# Patient Record
Sex: Male | Born: 1935 | Race: White | Hispanic: No | Marital: Married | Smoking: Former smoker
Health system: Southern US, Community
[De-identification: ages and names within clinical notes are randomized; demographics above are authoritative.]

## PROBLEM LIST (undated history)

## (undated) DIAGNOSIS — I639 Cerebral infarction, unspecified: Secondary | ICD-10-CM

## (undated) DIAGNOSIS — Z87891 Personal history of nicotine dependence: Secondary | ICD-10-CM

## (undated) DIAGNOSIS — I10 Essential (primary) hypertension: Secondary | ICD-10-CM

## (undated) DIAGNOSIS — I739 Peripheral vascular disease, unspecified: Secondary | ICD-10-CM

## (undated) DIAGNOSIS — I2510 Atherosclerotic heart disease of native coronary artery without angina pectoris: Secondary | ICD-10-CM

## (undated) DIAGNOSIS — J449 Chronic obstructive pulmonary disease, unspecified: Secondary | ICD-10-CM

## (undated) DIAGNOSIS — I200 Unstable angina: Secondary | ICD-10-CM

## (undated) DIAGNOSIS — I5042 Chronic combined systolic (congestive) and diastolic (congestive) heart failure: Secondary | ICD-10-CM

## (undated) DIAGNOSIS — J9610 Chronic respiratory failure, unspecified whether with hypoxia or hypercapnia: Secondary | ICD-10-CM

## (undated) DIAGNOSIS — N183 Chronic kidney disease, stage 3 unspecified: Secondary | ICD-10-CM

## (undated) DIAGNOSIS — M1990 Unspecified osteoarthritis, unspecified site: Secondary | ICD-10-CM

## (undated) DIAGNOSIS — E039 Hypothyroidism, unspecified: Secondary | ICD-10-CM

## (undated) DIAGNOSIS — I255 Ischemic cardiomyopathy: Secondary | ICD-10-CM

## (undated) DIAGNOSIS — C801 Malignant (primary) neoplasm, unspecified: Secondary | ICD-10-CM

## (undated) DIAGNOSIS — F419 Anxiety disorder, unspecified: Secondary | ICD-10-CM

## (undated) HISTORY — PX: OTHER SURGICAL HISTORY: SHX169

## (undated) HISTORY — PX: VASCULAR SURGERY: SHX849

## (undated) HISTORY — PX: STOMACH SURGERY: SHX791

## (undated) HISTORY — PX: BACK SURGERY: SHX140

## (undated) HISTORY — DX: Unstable angina: I20.0

---

## 2005-08-17 ENCOUNTER — Ambulatory Visit (HOSPITAL_COMMUNITY): Admission: RE | Admit: 2005-08-17 | Discharge: 2005-08-17 | Payer: Self-pay | Admitting: Vascular Surgery

## 2005-09-25 ENCOUNTER — Ambulatory Visit: Payer: Self-pay | Admitting: Cardiology

## 2005-09-29 ENCOUNTER — Ambulatory Visit: Payer: Self-pay | Admitting: Cardiology

## 2005-09-29 ENCOUNTER — Inpatient Hospital Stay (HOSPITAL_BASED_OUTPATIENT_CLINIC_OR_DEPARTMENT_OTHER): Admission: RE | Admit: 2005-09-29 | Discharge: 2005-09-29 | Payer: Self-pay | Admitting: Cardiology

## 2005-10-13 ENCOUNTER — Ambulatory Visit: Payer: Self-pay | Admitting: Cardiology

## 2005-10-15 ENCOUNTER — Inpatient Hospital Stay (HOSPITAL_COMMUNITY): Admission: RE | Admit: 2005-10-15 | Discharge: 2005-10-20 | Payer: Self-pay | Admitting: Vascular Surgery

## 2006-05-12 ENCOUNTER — Ambulatory Visit: Payer: Self-pay | Admitting: Vascular Surgery

## 2007-01-25 ENCOUNTER — Ambulatory Visit: Payer: Self-pay | Admitting: Vascular Surgery

## 2010-03-06 HISTORY — PX: CARDIAC CATHETERIZATION: SHX172

## 2010-07-29 NOTE — Assessment & Plan Note (Signed)
OFFICE VISIT   Earl Mueller, Earl Mueller  DOB:  May 19, 1935                                       01/25/2007  CHART#:[id number]   I saw the patient in the office today for continued to followup of his  peripheral vascular disease.  This is a pleasant 75 year old gentleman  who had undergone an aortobifemoral bypass graft back in August of 2007  for aortoiliac occlusive disease.  He had known bilateral superficial  femoral artery occlusions at that time also.  His symptoms improved  after his bypass.  However, he has continued to have some claudication  in both calves related to his superficial femoral artery occlusive  disease.  Over the last 4 to 5 months, the symptoms in the right leg  especially have gradually worsened.  He experiences pain in both calves,  associated with ambulation and relieved with rest.  He can walk about 15-  20 minutes before experiencing symptoms.  He has had no thigh or hip  claudication.  He denies any history of rest pain and has had no history  of nonhealing wounds.  He did not notice any sudden change in the onset  of his symptoms.  He noted a more gradual change.   REVIEW OF SYSTEMS:  Documented in the medical history form in his chart.   SOCIAL HISTORY:  He does continue to smoke less than 1/2 pack per day of  cigarettes.   PHYSICAL EXAMINATION:  This is a pleasant 75 year old gentleman who  appears his stated age.  His blood pressure is 157/72, heart rate is 80.  HEENT, he has a right carotid bruit.  There is no cervical  lymphadenopathy.  Lungs are clear bilaterally to auscultation.  On  cardiac exam, he has a regular rate and rhythm.  Abdomen is soft and  nontender.  He has normal-pitched bowel sounds.  His incisions have all  healed nicely.  He has palpable femoral pulses bilaterally.  I cannot  palpate popliteal or pedal pulses on either side.  He has a right  posterior tibial signal with the Doppler, which is monophasic.  On  the  left side, he has a dorsalis pedis and posterior tibial signal with the  Doppler, which is monophasic.  He has no ischemic ulcers on his feet.  He has no significant lower extremity swelling.  Neurological exam is  nonfocal.   This patient has stable claudication related to his known superficial  femoral artery occlusive disease.  There has been some slight  progression.  His aortofemoral bypass graft is functioning well.  Again,  I have stressed to him the importance of tobacco cessation.  Have  discussed the importance of a structured walking program to help  maintain and build collaterals.  He understands if his symptoms progress  or become disabling, the next step would be arteriography to determine  what options he has for an infrainguinal bypass.  If he can get by  without, however, I would like to avoid re-operating on his groins  because with an aortofemoral bypass graft, there is some slight increase  of infection with this.  I plan on seeing him back in 6 months.  He  knows to call sooner if he has problems.  We will also obtain a followup  carotid duplex of his moderate 40-59% right carotid stenosis at that  time.   Di Kindle. Edilia Bo, M.D.  Electronically Signed   CSD/MEDQ  D:  01/25/2007  T:  01/26/2007  Job:  [id number]   cc:   Jacqualine Mau

## 2010-08-01 NOTE — Letter (Signed)
October 12, 2005     Di Kindle. Edilia Bo, MD  [address]  [place], [place]  [address]   RE:  Earl Mueller, Earl Mueller  MRN:  [id number]  /  DOB:  [birth year]   Dear Thayer Ohm:  This letter is in regards to Earl Mueller.  As you know, he was referred for  followup of his abnormal stress test which suggested moderate lateral wall  ischemia and reduced ejection fraction.  He has significant peripheral  vascular disease and multiple cardiovascular risk factors.  Despite this, he  does not describe any chest discomfort or other anginal equivalents.  He is  somewhat limited in activities because of his legs.  I did refer him for a  cardiac catheterization.  The results are described extensively in Dr.  Ival Bible note.  He has diffusely diseased vessels with small LAD,  moderately diffusely diseased.  He does have a focal proximal 70% circumflex  lesion.  The right coronary artery has 40-50% stenosis improving with  nitroglycerin.  His EF was approximately 50%.   It was not felt that this gentleman had any critical lesions that would  benefit from revascularization prior to revascularization of his lower  extremities.  This is in accordance with emerging data to suggest the  benefit of medical management in patients such as this being equivalent or  superior to intervention.   Based on this, this patient is at some risk for cardiovascular events.  However, again to reduce this risk optimally, we will pursue aggressive  medical management.  Towards that end, he is started on a beta blocker and  will also be on nitrates.   He should be monitored closely postoperatively and continued on these  medications.   I have had a discussion with the patient.  In addition, Dr. Diona Browner  reviewed this with the patient and his wife.  I would be happy to be  notified upon his admission to follow along with you during his  hospitalization.  If you have any questions, please do not hesitate to give  me a  page at [phone].    Sincerely,      Rollene Rotunda, MD, [facility]   JH/MedQ  DD:  10/12/2005  DT:  10/12/2005  Job #:  [id number]

## 2010-08-01 NOTE — Op Note (Signed)
NAME:  KWELI, GRASSEL NO.:  [id number]   MEDICAL RECORD NO.:  [id number]          PATIENT TYPE:  AMB   LOCATION:  SDS                          FACILITY:  [facility]   PHYSICIAN:  Di Kindle. Edilia Bo, M.D.DATE OF BIRTH:  1936-02-26   DATE OF PROCEDURE:  08/17/2005  DATE OF DISCHARGE:                                 OPERATIVE REPORT   PREOPERATIVE DIAGNOSIS:  Aortoiliac occlusive disease with disabling  claudication.   POSTOPERATIVE DIAGNOSIS:  Aortoiliac occlusive disease with disabling  claudication.   PROCEDURE:  1.  Aortogram.  2.  Bilateral iliac arteriogram.  3.  Bilateral lower extremity runoff.   SURGEON:  Dr. Edilia Bo.   ANESTHESIA:  Local with sedation.   TECHNIQUE:  The patient was taken to the PV lab at [facility], and sedated with 1  mg of Versed and 50 mcg of fentanyl.  Both groins were prepped and draped in  usual sterile fashion.  Using a Doppler, I was able to cannulate the right  common femoral artery, after the skin was anesthetized.  The 5-French sheath  was introduced into the right common femoral artery and the dilator was  removed, and a pigtail catheter positioned at the L1 vertebral body.  Flush  aortogram obtained.  The catheter was then repositioned above the aortic  bifurcation, and oblique iliac projections were obtained, then bilateral  lower extremity runoff films were obtained.  The pigtail was removed over a  wire, and additional films were obtained of the right external iliac artery  and right groin through the sheath.   FINDINGS:  There are single renal arteries bilaterally with no significant  renal artery stenosis identified.  There is no significant narrowing within  the infrarenal aorta.  There is a large IMA which is providing some  collateral flow to the pelvis.   On the right side, there is mild diffuse disease of the common iliac artery  with severe diffuse disease of the external iliac artery on the right.  There is a  focal stenosis in the common femoral artery, and the superficial  femoral artery is occluded at its origin.  The deep femoral artery is  patent.  There is reconstitution of the above-knee popliteal artery,  although this has moderate diffuse disease.  The below-knee popliteal artery  is patent and the anterior tibial and peroneal arteries are occluded on the  right with single-vessel runoff via the posterior tibial artery on the  right.   On the left side, there is mild diffuse disease of the common iliac artery.  There is a long segment stenosis of the external iliac artery, and the  common femoral artery and deep femoral artery are patent on the left.  The  superficial femoral artery is occluded at its origin with reconstitution of  the below-knee popliteal artery.  Proximally the tibial vessels are patent.  There is posterior tibial runoff to the foot.  There is poor visualization  distally in the peroneal and anterior tibial arteries.   CONCLUSIONS:  Severe iliac disease bilaterally and bilateral superficial  femoral artery occlusions with tibial occlusive  disease as described above.      Di Kindle. Edilia Bo, M.D.  Electronically Signed     CSD/MEDQ  D:  08/17/2005  T:  08/17/2005  Job:  [id number]   cc:   Dr. Brantley Persons. Edilia Bo, M.D.  [address]  [place]  [place] [address]

## 2010-08-01 NOTE — Op Note (Signed)
NAME:  Earl Mueller, Earl Mueller NO.:  [id number]   MEDICAL RECORD NO.:  [id number]          PATIENT TYPE:  INP   LOCATION:  2550                         FACILITY:  [facility]   PHYSICIAN:  Di Kindle. Edilia Bo, M.D.DATE OF BIRTH:  June 06, 1935   DATE OF PROCEDURE:  10/15/2005  DATE OF DISCHARGE:                                 OPERATIVE REPORT   PREOPERATIVE DIAGNOSES:  1.  Aortoiliac occlusive disease.  2.  Bilateral superficial femoral artery occlusions.   POSTOPERATIVE DIAGNOSES:  1.  Aortoiliac occlusive disease.  2.  Bilateral superficial femoral artery occlusions.   PROCEDURE:  Aortobifemoral bypass graft with 14 x 7 Dacron graft.   SURGEON:  Di Kindle. Edilia Bo, M.D.   ASSISTANTS:  Larina Earthly, M.D., and Jerold Coombe, P.A.   ANESTHESIA:  General.   INDICATIONS:  This is a 75 year old gentleman who had presented with  disabling claudication.  Despite attempts at a walking program and tobacco  cessation, he continued to have disabling symptoms and wished to proceed  with revascularization.  Arteriogram demonstrated bilateral severe iliac  disease, and it was felt that the best option was aortofemoral bypass  grafting.  He underwent preoperative cardiac evaluation and was cleared for  surgery.  He presents for elective aortofemoral bypass graft surgery.   TECHNIQUE:  The patient was taken to the operating room and received a  general anesthetic.  Swan-Ganz catheter and arterial line had been placed by  anesthesia.  The left groin was opened longitudinally and the common  femoral, deep femoral and superficial femoral arteries were exposed.  The  superficial femoral artery was chronically occluded.  There was extensive  plaque within the common femoral artery, but it was a soft plaque and had a  soft spot along the anterior lateral aspect of the artery.  The  retroperitoneal tunnel was started on the left.  Next a longitudinal  incision was made in the  right groin and the right common femoral, deep  femoral and superficial femoral artery were controlled.  There were 2 deep  femoral branches.  Again there was extensive calcific disease within the  common femoral artery.  The retroperitoneal tunnel was started on the right.  The abdomen was then entered through a midline incision.  The patient had  had previous partial gastrectomy and extensive adhesions were lysed to allow  mobilization of the transverse colon omentum superiorly.  The small bowel  was then reflected to the patient's right.  Retroperitoneal tissue was  divided and the infrarenal aorta exposed up to the level of the right renal  artery, which was the most inferior renal artery.  The distal aorta was  exposed down the bifurcation and the retroperitoneal tunnels were created.  The patient was then heparinized and also received 25 g of mannitol.  A 14 x  7 graft was selected.  The infrarenal aorta was clamped and the distal aorta  clamped.  This patient had functionally occluded external iliac arteries  bilaterally with a large IMA, and I thought an end-to-side anastomosis was  indicated proximally.  Once the aorta had  been controlled, a longitudinal  arteriotomy was made and an ellipse of aorta excised.  The 14 x 7 graft was  cut to the appropriate length, spatulated and sewn end to side to the  infrarenal aorta using continuous 3-0 Prolene suture.  The proximal  anastomosis was flushed and tested and was hemostatic.  The graft limbs were  then clamped.  Each limb was brought down to their respective limbs.  Using  a 2-team approach, the limbs of the graft was anastomosed to the distal  common femoral artery, extending onto the deep femoral artery on both sides.  On the left side the proximal common femoral artery was clamped.  The deep  femoral artery was clamped.  A longitudinal arteriotomy was made along the  anterior lateral aspect of the common femoral artery extending  onto the deep  femoral artery origin.  The left limb of the graft was cut to the  appropriate length, spatulated and sewn end-to-side to the artery using  continuous 5-0 Prolene suture.  Prior to completing this anastomosis, the  artery was back bled and flushed appropriately and the anastomosis  completed.  Flow was reestablished to the left leg.  On the right side the  common femoral artery was clamped proximally.  The deep femoral artery was  controlled with a vessel loop distally.  A longitudinal arteriotomy was made  in the common femoral artery extending down onto the deep femoral artery  origin.  Some old clot was removed and then the right limb of the graft cut  to the appropriate length, spatulated and sewn end-to-side to the common  femoral artery extending onto the deep femoral artery using continuous 5-0  Prolene suture.  Again, prior to completing this anastomosis the artery was  back bled and flushed appropriately and the anastomosis completed.  Flow was  reestablished to the right leg, which the patient tolerated from a  hemodynamic standpoint.  Hemostasis was obtained in the wounds and the  heparin was reversed with the protamine.  The retroperitoneal tissue was  closed with running 2-0 Vicryl.  The abdominal contents were returned to  their normal position and the fascial layer was closed with two #1 PDS  sutures.  The skin was closed with staples.  Hemostasis was obtained in the  groins and the groins were closed with a deep layer of 2-0 Vicryl, a  subcutaneous layer of 2-0 Vicryl and the skin closed with a 4-0 subcuticular  stitch.  Sterile dressing was applied.  The patient tolerated the procedure  well and was transferred to the recovery room in satisfactory condition.  All needle and sponge counts were correct.      Di Kindle. Edilia Bo, M.D.  Electronically Signed     CSD/MEDQ  D:  10/15/2005  T:  10/15/2005  Job:  [id number]  cc:   Rollene Rotunda, M.D.  Jacqualine Mau

## 2010-08-01 NOTE — Cardiovascular Report (Signed)
NAME:  Earl Mueller, Earl Mueller NO.:  [id number]   MEDICAL RECORD NO.:  [id number]          PATIENT TYPE:  OIB   LOCATION:  1966                         FACILITY:  [facility]   PHYSICIAN:  Jonelle Sidle, M.D. LHCDATE OF BIRTH:  04/07/1935   DATE OF PROCEDURE:  09/29/2005  DATE OF DISCHARGE:                              CARDIAC CATHETERIZATION   REQUESTING CARDIOLOGIST:  Rollene Rotunda, M.D.   INDICATIONS:  Mr. Kalmbach is a 75 year old male with known peripheral  arterial disease particularly involving the aortoiliac system.  He is being  considered for vascular surgery and was referred for preoperative evaluation  including a myocardial perfusion study.  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  No symptoms were  reported with this study nor were there diagnostic electrocardiographic  changes.  He was seen by Dr. Antoine Poche and referred for diagnostic coronary  angiography to clearly assess the coronary anatomy and evaluate for any  possible revascularization strategies.  Additional history includes  hypertension and a remote history of tobacco use.  His main limiting symptom  at this point is claudication.  The risks and benefits were explained to the  patient in advance and informed consent was obtained.   PROCEDURES PERFORMED:  1.  Left heart catheterization  2.  Selective coronary angiography.  3.  Left ventriculography.  4.  Distal aortography.   ACCESS AND EQUIPMENT:  The area about the right femoral artery was  anesthetized with 1% lidocaine and a 5 French sheath was placed following a  single anterior wall puncture using the modified Seldinger technique.  A  long 5-French sheath was used.  A wire was fairly easily passed through the  area of right iliac stenosis that was described by angiography in June.  Standard preformed 5-French JL-4 and  JR-4 catheters were used for selective  coronary angiography and an angled pigtail catheter was used for left heart  catheterization, left ventriculography, and distal aortography.  All  exchanges were made over a wire.  A total 125 mL Omnipaque were used.  The  patient tolerated the procedure well without immediate complications.   HEMODYNAMIC RESULTS:  Aorta 128/55 mmHg.  Left ventricle 135/4 mmHg.   ANGIOGRAPHIC FINDINGS:  1.  The left main coronary artery is mildly calcified and gives rise to the      left anterior descending and circumflex coronary arteries.  This vessel      was relatively short.  There is no significant flow limiting coronary      atherosclerosis noted.  2.  The circumflex coronary artery is a small to medium caliber vessel.      There are four obtuse marginal branches noted, the second of which is      the largest and bifurcates.  Within the proximal circumflex is a 70%      focal stenosis.  There are other minor luminal irregularities seen, as      well.  3.  The left anterior descending is a  relatively small caliber vessel that      extends to the apex.  There is essentially one medium caliber diagonal      branch at the mid vessel level.  A fairly large septal perforator is      noted proximally.  Within the proximal left anterior descending is      calcification as well as a diffuse 70% stenosis.  This also affects the      ostium of the septal perforator which is stenosed at approximately 75%.      There is a 50% focal stenosis within the mid left anterior descending      prior to the takeoff of the diagonal branch.  4.  The right coronary artery is a small caliber vessel with small posterior      descending branch.  There is a fairly prominent right ventricular branch      noted near the ostium of the vessel.  There are minor luminal      irregularities noted.  Within the proximal right coronary artery is a 40-      50% stenosis that actually looked more  pronounced with an element of      coronary vasospasm that was likely catheter induced.  With intracoronary      nitroglycerin, this area improved to a baseline of approximately 40-50%.  5.  Left ventriculography was performed in the RAO projection.  There was      significant ventricular ectopy which limited this somewhat, however,      ejection fraction is estimated at 50% and there is no significant mitral      regurgitation noted.  6.  Distal aortography was performed.  The renal arteries are patent      bilaterally.  There is a fairly severe iliac disease involving the      common iliacs, predominantly right much more significant than left.  The      left sided disease is more focal in character.  The distal vessels are      not well seen.   DIAGNOSIS:  1.  Coronary disease as outlined.  The coronary vessels are relatively small      to at most medium in caliber and likely diffusely diseased.  There is a      70% diffuse area of stenosis within the proximal left anterior      descending that also affects the ostium of a large septal perforator.      Within the proximal circumflex is an area of 70% stenosis and an area of      approximately 40-50% stenosis noted within the proximal right coronary      artery.  There was an element of spasm affecting the proximal coronary      artery which improved with intracoronary nitroglycerin.  2.  Left ventricular ejection fraction approximately 50% with no mitral      regurgitation and left ventricular end diastolic pressure of 4 mmHg.  3.  Severe bilateral iliac disease, right greater than left.  There is more      diffuse disease noted on the right.  The renal arteries are patent      bilaterally.   DISCUSSION:  I reviewed the results with the patient and his wife.  I also  had Dr. Juanda Chance review the films and discussed the results with Dr. Antoine Poche.  At this point, the plan will be to continue his medical therapy with the addition of a  low-dose beta blocker and  eventually a statin.  It is not  entirely clear that coronary revascularizations is required at this point to  consider peripheral arterial surgery.  Particularly in the absence of  symptoms (angina or  congestive heart failure) and with no large anterior defect demonstrated by  a Myoview study.  He will, however, carry at least moderately increased  perioperative risk for cardiac complications.  The plan will be to follow  with Dr. Antoine Poche.  I will have the films reviewed also by our peripheral  interventionalist and formulation of final plan.      Jonelle Sidle, M.D. [facility]  Electronically Signed     SGM/MEDQ  D:  09/29/2005  T:  09/29/2005  Job:  [id number]   cc:   Rollene Rotunda, M.D.  [address]  [address]  [place]  [place] [address]   Wyonia Hough, M.D.   Di Kindle. Edilia Bo, M.D.  [address]  [place]  [place] [address]

## 2010-08-01 NOTE — H&P (Signed)
NAME:  Earl Mueller, Earl Mueller NO.:  [id number]   MEDICAL RECORD NO.:  [id number]           PATIENT TYPE:   LOCATION:                                 FACILITY:   PHYSICIAN:  Di Kindle. Edilia Bo, M.D.DATE OF BIRTH:  1936-02-10   DATE OF ADMISSION:  10/15/2005  DATE OF DISCHARGE:                                HISTORY & PHYSICAL   REASON FOR ADMISSION:  Aortoiliac occlusive disease with disabling  claudication.   HISTORY:  This is a pleasant 75 year old gentleman whom I have been  following with peripheral vascular disease.  He had known aortoiliac  occlusive disease.  When I last saw him in followup in May, his symptoms had  become quite disabling and he wished to pursue revascularization.  At that  time, he had an ABI of 38% on the right and 39% on the left.  He has  undergone cardiac evaluation including Cardiolite which was abnormal.  This  prompted a cardiac catheterization which showed some diffuse coronary  disease but it was felt that this could be treated medically and he has  cleared by Dr. Antoine Poche for aortofemoral bypass grafting.  This patient has  had a long history of claudication in both lower extremities which began  back in 1997.  He experiences pain in his calves, thighs, and hips  associated with ambulation and relieved with rest.  He has had no  significant rest pain and no history of nonhealing wounds.   PAST MEDICAL HISTORY:  Fairly unremarkable.  He denies any history of  diabetes, hypertension, hypercholesterolemia, history of previous myocardial  infarction, or history of congestive heart failure or emphysema.   FAMILY HISTORY:  His mother died with cancer.  His father died in his 7s  with congestive heart failure.  He is unaware of any history of premature  cardiovascular disease.   SOCIAL HISTORY:  He is married.  He has two children.  He smokes a pack of  cigarettes per day but has cut back considerably in that he had been smoking  up  to two packs per day.  We have on multiple occasions discussed the  importance of tobacco cessation.   REVIEW OF SYSTEMS:  GENERAL:  He has had no weight loss, weight gain,  problems with his appetite.  CARDIAC:  He has had no recent chest pain,  chest pressure, palpitations, or arrhythmias.  PULMONARY:  He has had no  recent bronchitis, asthma, or wheezing.  GI:  He has had no recent in his  bowel habits and has no history of peptic ulcer disease.  GU:  He has had no  dysuria or frequency.  VASCULAR:  He has had no DVT or phlebitis.  He has  significant bilateral lower extremity claudication as described above.  NEURO:  He has had no dizziness, black-outs, headaches, or seizures.  Psychiatric:  He has had nom nervousness or depression.  HEMATOLOGIC:  He  has had no bleeding problems or clotting disorders.   ALLERGIES:  No known drug allergies.   MEDICATIONS:  Aspirin daily.   PHYSICAL EXAMINATION:  VITAL  SIGNS:  Blood pressure 120/76, heart rate 78.  He has bilateral carotid bruits.  LUNGS:  Clear bilaterally to auscultation.  CARDIAC:  He has regular rate and rhythm.  ABDOMEN:  Soft and nontender.  EXTREMITIES:  I cannot palpate femoral, popliteal, or pedal pulses.  He has  no ischemic ulcers on his feet.  He has no significant lower extremity  swelling.   LABORATORY DATA:  His arteriogram shows diffuse iliac disease bilaterally  with a large IMA.  He has significant external iliac artery disease,  especially on both sides.   Given that his symptoms have become disabling, he would like to pursue  aortofemoral bypass grafting.  I think this is the best option given his  age.  We have discussed the indications for the procedure and potential  complications including but not limited to bleeding, graft thrombosis, wound  healing problems, graft infection, MI, or other unpredictable medical  problems.  All of his questions were answered and he is agreeable to  proceed.  His surgery  has been scheduled for October 15, 2005.      Di Kindle. Edilia Bo, M.D.  Electronically Signed     CSD/MEDQ  D:  10/14/2005  T:  10/14/2005  Job:  [id number]   cc:   Rollene Rotunda, M.D.  Jacqualine Mau

## 2010-08-01 NOTE — Assessment & Plan Note (Signed)
[facility]                              CARDIOLOGY OFFICE NOTE   REMY, VOILES                       MRN:          [id number]  DATE:09/25/2005                            DOB:          Mar 30, 1935    PRIMARY CARE PHYSICIAN:  Jacqualine Mau, M.D.   REASON FOR REFERRAL:  Patient with abnormal stress perfusion study.   HISTORY OF PRESENT ILLNESS:  The patient is a very pleasant 75 year old  gentleman with no history of coronary disease.  He has documented severe  peripheral vascular disease with aorto-iliac occlusive disease.  He is to  have lower extremity revascularization.  As part of this evaluation by Dr.  Waverly Ferrari, he had a stress perfusion study.  This was done at  [facility].  It demonstrated lateral wall ischemia.  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  His EF was about 38%.  He saw Dr. Lodema Hong today  in follow up.  Dr. Lodema Hong called our office and he was added onto my  schedule.  His lower extremity revascularization is pending preoperative  clearance.   The patient has been very limited by his claudication.  He walks a short  distance on level ground before getting leg pain.  With his low level of  activity, he does not describe any chest discomfort, neck discomfort, arm  discomfort, activity induced nausea, vomiting, or excessive diaphoresis.  He  has had no palpitations, pre-syncope, or syncope.  He denies any PND or  orthopnea.  He continues to smoke one pack of cigarettes a day.   PAST MEDICAL HISTORY:  Hypertension recently diagnosed, peripheral vascular  disease as described, non-obstructive carotid artery disease.  He has never  been told that he had diabetes or dyslipidemia.   PAST SURGICAL HISTORY:  Stomach surgery for bleeding ulcer in 1972, back  surgery.   ALLERGIES:  None.   MEDICATIONS:  1.  Aspirin 81 mg daily.  2.  Triamterene/hydrochlorothiazide 37.5/25 mg daily.   SOCIAL HISTORY:  The  patient has been smoking since his teenage years though  he quit for ten years when his children were young.  He is retired.  He is  married.  He has two children.   FAMILY HISTORY:  Noncontributory for early coronary artery disease, though  his father had heart disease starting in his 30s.   REVIEW OF SYSTEMS:  As stated in the HPI.  Positive for cough.  Negative for  all other systems.   PHYSICAL EXAMINATION:  GENERAL:  The patient is in no distress.  VITAL SIGNS:  Blood pressure 166/73, heart rate 74 and regular, weight 131  pounds.  HEENT:  Eyes unremarkable.  Pupils equal, round, reactive to light.  Fundi  not visualized.  Oral mucosa unremarkable.  NECK:  No jugular venous distention at 45 degrees, carotid upstroke brisk  and symmetric, no thyromegaly.  LYMPHATICS:  No cervical, axillary, or inguinal adenopathy.  LUNGS:  Decreased breath sounds without wheezing or crackles.  BACK:  No costovertebral angle tenderness.  CHEST:  Unremarkable.  HEART:  PMI not displaced or sustained,  S1 and S2 within normal limits, no  S3, no S4, no murmurs.  ABDOMEN:  Well healed surgical scar, flat, positive bowel sounds, normal in  frequency and pitch, no bruits, no rebound, guarding in the midline,  pulsatile mass or organomegaly.  SKIN:  No rashes, no nodules.  EXTREMITIES:  2+ upper pulses, 1+ bilateral femorals, absent popliteals and  dorsalis pedis, and posterior tibialis bilaterally, no cyanosis or clubbing,  no edema.  NEUROLOGICAL:  Oriented to person, place, and time.  Cranial nerves 2-12  grossly intact.  Motor grossly intact.   EKG sinus rhythm, rate 72, axis within normal limits, intervals within  normal limits, no acute ST-T wave change.   ASSESSMENT/PLAN:  1.  Abnormal Cardiolite.  The patient has an abnormal Cardiolite with      evidence for possible significant coronary disease.  Given this, I would      not interpret this as a low risk scan.  He is also going for a high  risk      surgery.  He is very low functional status.  Therefore, despite the      absence of symptoms, I think the next step in his preoperative      evaluation needs to be cardiac catheterization.  He understands the      risks and benefits of this.  His wife has been through the procedure and      has had bypass surgery.  He agrees to this.  I have reviewed his      angiogram and it looks like we would be able to proceed via the groin      and I will make sure of this.  He will be set up with an outpatient      procedure and I will set him up with Dr. Diona Browner on Tuesday.  2.  Tobacco.  He is going to start Chantix.  .  3.  Risk reduction.  He was just given a prescription for Lipitor today.  4.  Hypertension.  He can have this further addressed based on the results      of his      catheterization and follow up blood pressures.  5.  Follow up will be at the time of catheterization.                               Rollene Rotunda, MD, [facility]    JH/MedQ  DD:  09/25/2005  DT:  09/25/2005  Job #:  [id number]   cc:   Jacqualine Mau, M.D.  Di Kindle. Edilia Bo, MD

## 2010-08-01 NOTE — Discharge Summary (Signed)
NAME:  Earl Mueller, BACA NO.:  [id number]   MEDICAL RECORD NO.:  [id number]          PATIENT TYPE:  INP   LOCATION:  2028                         FACILITY:  [facility]   PHYSICIAN:  Di Kindle. Edilia Bo, M.D.DATE OF BIRTH:  1935/12/19   DATE OF ADMISSION:  10/15/2005  DATE OF DISCHARGE:                                 DISCHARGE SUMMARY   ADMISSION DATE:  October 15, 2005   DISCHARGE DATE:  Tentatively one to two days on October 19, 2005   ADMISSION DIAGNOSIS:  Aortoiliac occlusive disease with disabling  claudication.   DISCHARGE DIAGNOSES:  1. Aortoiliac occlusive disease status post an aortobifemoral bypass      graft.  2. Hypertension.  3. Hyperlipidemia.   CONSULTS:  None.   PROCEDURE:  On October 15, 2005, the patient underwent aortobifemoral bypass  graft with a 14 x 7 Dacron graft by Dr. Waverly Ferrari.   HISTORY AND PHYSICAL:  This is a pleasant 75 year old gentleman who was  admitted for peripheral vascular disease.  The patient has known aortoiliac  occlusive disease.  Patient was first seen in follow-up in May, symptoms had  become quite disabling and he wish to pursue vascularization.  At that time,  he had an ABI of 38% on the right and 39% on the left.  Patient has  undergone cardiac evaluation including Cardiolite which is abnormal.  This  prompted a cardiac cath which showed some diffuse coronary disease which was  felt could be treated medically and he has been cleared by Dr. Antoine Poche  for  an aortofemoral bypass graft.  Patient has had a long history of  claudication in both lower extremities which began back in 1987.  The  patient experiences pain in his calves, thighs, and hips associated with  ambulation and relieved with rest.  The patient has no significant rest pain  and no history of nonhealing wounds.   HOSPITAL COURSE:  Postoperatively, the patient has progressed as expected  and quite well.  Patient has remained hemodynamically  stable.  The patient's  electrolytes have remained stable also.  The patient's NG tube stayed in  until the patient's GI function had returned.  The patient remained NPO on  postop day number two.  On postop day number three, the patient started sips  of clears.  On postop day number four, the patient was eating a regular diet  and tolerating this well.   The patient's pain was managed with a Dilaudid PCA pump.  Patient's PCA pump  was discontinued on October 19, 2005 and he was started on p.o. pain  medication and his home medications.  The patient is ambulating fairly well.  The patient is contributing to his incentive spirometry.  A tobacco  cessation consult was given prior to discharge due to his long history of  smoking.  The patient did experience a bowel movement on postop day number  three.   PHYSICAL EXAMINATION:  VITAL SIGNS:  Blood pressure 129/79, heart rate 95,  respirations 18, temp 97.5, O2 sat is 96% on room air, INO negative 2200,  weight 60.6  kg.  CARDIOVASCULAR:  Regular rate and rhythm.  RESPIRATIONS:  Clear to auscultation bilaterally.  ABDOMEN:  Bowel sounds positive, soft, nontender, nondistended.  EXTREMITIES:  No edema.  Left lower extremities warm bilaterally.  Incisions  clear, dry and intact.   Labs show a white blood cell count of 5.8, hemoglobin 11.3, hematocrit 32.5,  platelet count 185.  BMP showed a sodium of 139, potassium 4.2, chloride  106, CO2 27, BUN 5, creatinine 0.7 and glucose of 117.   The patient will be discharged home in the next two days provided he is  tolerating his diet well and ambulating without difficulty.   DISCHARGE CONDITION:  Good.   DISPOSITION:  The patient will be discharged home.   MEDICATIONS:  1. Tylox one to two tablets every four hours p.r.n.  2. Aspirin 81 mg p.o. daily.  3. Lipitor 10 mg p.o. daily.  4. Toprol XL 25 mg p.o. daily.   INSTRUCTIONS:  The patient instructed to follow a low fat, low-salt diet.  No  driving or heavy lifting greater than ten pounds for three weeks.  Patient is to ambulate and increase activity as tolerated.  Patient is to  continue his breathing exercises.  Patient may clean his wounds with mild  soap and water.  He is instructed to call the office for any problems such  as incision erythema, drainage, temp greater than 101.5.  The patient was  counseled on smoking cessation.   FOLLOWUP:  The patient will have a follow-up appointment with Dr. Edilia Bo in  three weeks, the office will contact him with the time and date.  Patient  will have a staple-removal appointment in two weeks, the office will contact  him, again, with the date and time.      Constance Holster, PA      Di Kindle. Edilia Bo, M.D.  Electronically Signed    JMW/MEDQ  D:  10/19/2005  T:  10/19/2005  Job:  [id number]

## 2010-08-01 NOTE — Assessment & Plan Note (Signed)
[facility]                              CARDIOLOGY OFFICE NOTE   NAME:Earl Mueller, Earl Mueller                       MRN:          [id number]  DATE:10/13/2005                            DOB:          18-Dec-1935    PRIMARY CARE PHYSICIAN:  Dr. Jacqualine Mau.   REASON FOR PRESENTATION:  Evaluation of patient with coronary disease.   HISTORY OF PRESENT ILLNESS:  Patient follows up after his cardiac  catheterization.  He was found by Dr. Diona Browner to have an LAD with  calcification and diffuse 70% stenosis.  There was 50% in the mid vessel.  The circumflex had a proximal 70% stenosis.  The right coronary artery had  some 40-50% stenosis that seemed to dilate with nitrates.  The EF was  approximately 50%.   Based on this and after careful consideration with Drs. Bennie Hind,  and myself, it was note felt that the patient had a critical lesion that  necessitated intervention before having a lower extremity surgery; however,  he is at moderate risk, and we are attempting to optimize his beta blocker  to provide risk reduction for impending lower extremity revascularization.   He returns today.  He had no problems following the catheterization.  He has  had no chest pain or shortness of breath.  He continues to deny with  activity any anginal symptoms.  He is somewhat limited by lower extremity  pain.   PAST MEDICAL HISTORY:  Hypertension, peripheral vascular disease,  nonobstructive carotid artery disease, stomach surgery for bleeding ulcer in  1972, back surgery.   ALLERGIES:  None.   MEDICATIONS:  Aspirin 81 mg a day, metoprolol 12.5 mg daily,  triamterene/hydrochlorothiazide, Lipitor 20 mg a day.   REVIEW OF SYSTEMS:  As stated in the HPI and otherwise negative for other  systems.   PHYSICAL EXAMINATION:  VITAL SIGNS:  Blood pressure 142/78, heart rate 88  and regular.  Weight 133 pounds.  GENERAL:  Patient is in no distress.  HEENT:  Eyes  unremarkable.  Pupils are equal, round and reactive to light.  Fundi are not visualized.  Oral mucosa unremarkable.  NECK:  No jugular venous distention, wave form within normal limits, carotid  upstroke brisk and symmetric with no bruits.  No bruits, no thyromegaly.  LYMPHATICS:  No cervical, axillary, or inguinal adenopathy.  LUNGS:  Clear to auscultation bilaterally.  BACK:  No costovertebral angle tenderness.  CHEST:  Unremarkable.  HEART:  PMI not displaced or sustained.  S1 and S2 within normal limits.  No  S3, no S4, no murmurs.  ABDOMEN:  A well-healed surgical scar, positive bowel sounds.  No rebound or  guarding.  No pulsatile mass.  EXTREMITIES:  Upper pulses 2+.  Bilateral femorals 1+.  Absent popliteals  and dorsalis pedis and posterior tibialis bilaterally.  NEUROLOGIC:  Grossly intact.   ASSESSMENT/PLAN:  1.  Coronary artery disease:  Patient has coronary disease, as described.      In order to reduce his perioperative risk, I am going to maximize his      beta blocker.  He is going to start by increasing the 25 mg Toprol XL a      day.  I will then increase over the next three days to 50 mg, if he      tolerates this.  The goal will be to try to get his heart rate into the      60s or lower.  He is waiting to hear back from Dr. Edilia Bo about the      timing of surgery.  2.  Tobacco:  He is planning on starting Chantix and quitting smoking after      his surgery.  3.  Followup:  We will see him back probably at the time of his      hospitalization and will schedule followup thereafter.                               Rollene Rotunda, MD, [facility]    JH/MedQ  DD:  10/13/2005  DT:  10/13/2005  Job #:  [id number]   cc:   Di Kindle. Edilia Bo, MD  Jacqualine Mau

## 2013-05-10 DIAGNOSIS — L98499 Non-pressure chronic ulcer of skin of other sites with unspecified severity: Secondary | ICD-10-CM

## 2013-05-10 HISTORY — DX: Non-pressure chronic ulcer of skin of other sites with unspecified severity: L98.499

## 2013-05-17 ENCOUNTER — Encounter (HOSPITAL_COMMUNITY): Payer: Self-pay | Admitting: Pharmacy Technician

## 2013-05-19 ENCOUNTER — Encounter (HOSPITAL_COMMUNITY): Payer: Self-pay

## 2013-05-19 ENCOUNTER — Encounter (INDEPENDENT_AMBULATORY_CARE_PROVIDER_SITE_OTHER): Payer: Self-pay

## 2013-05-19 ENCOUNTER — Encounter (HOSPITAL_COMMUNITY)
Admission: RE | Admit: 2013-05-19 | Discharge: 2013-05-19 | Disposition: A | Discharge status: Home / Self Care | Payer: Medicare PPO | Source: Ambulatory Visit | Attending: Plastic Surgery | Admitting: Plastic Surgery

## 2013-05-19 DIAGNOSIS — Z01810 Encounter for preprocedural cardiovascular examination: Secondary | ICD-10-CM | POA: Insufficient documentation

## 2013-05-19 DIAGNOSIS — Z01818 Encounter for other preprocedural examination: Secondary | ICD-10-CM | POA: Insufficient documentation

## 2013-05-19 DIAGNOSIS — Z01812 Encounter for preprocedural laboratory examination: Secondary | ICD-10-CM | POA: Insufficient documentation

## 2013-05-19 HISTORY — DX: Peripheral vascular disease, unspecified: I73.9

## 2013-05-19 HISTORY — DX: Atherosclerotic heart disease of native coronary artery without angina pectoris: I25.10

## 2013-05-19 HISTORY — DX: Hypothyroidism, unspecified: E03.9

## 2013-05-19 HISTORY — DX: Malignant (primary) neoplasm, unspecified: C80.1

## 2013-05-19 HISTORY — DX: Cerebral infarction, unspecified: I63.9

## 2013-05-19 HISTORY — DX: Anxiety disorder, unspecified: F41.9

## 2013-05-19 HISTORY — DX: Essential (primary) hypertension: I10

## 2013-05-19 LAB — BASIC METABOLIC PANEL
BUN: 19 mg/dL (ref 6–23)
CO2: 26 mEq/L (ref 19–32)
Calcium: 9.4 mg/dL (ref 8.4–10.5)
Chloride: 105 mEq/L (ref 96–112)
Creatinine, Ser: 1.53 mg/dL — ABNORMAL HIGH (ref 0.50–1.35)
GFR calc Af Amer: 50 mL/min — ABNORMAL LOW (ref >90)
GFR calc non Af Amer: 43 mL/min — ABNORMAL LOW (ref >90)
Glucose, Bld: 104 mg/dL — ABNORMAL HIGH (ref 70–99)
Potassium: 4.6 mEq/L (ref 3.7–5.3)
Sodium: 144 mEq/L (ref 137–147)

## 2013-05-19 LAB — CBC
HCT: 37.7 % — ABNORMAL LOW (ref 39.0–52.0)
HEMOGLOBIN: 12.9 g/dL — AB (ref 13.0–17.0)
MCH: 31.6 pg (ref 26.0–34.0)
MCHC: 34.2 g/dL (ref 30.0–36.0)
MCV: 92.4 fL (ref 78.0–100.0)
Platelets: 272 10*3/uL (ref 150–400)
RBC: 4.08 MIL/uL — ABNORMAL LOW (ref 4.22–5.81)
RDW: 17.1 % — ABNORMAL HIGH (ref 11.5–15.5)
WBC: 10 10*3/uL (ref 4.0–10.5)

## 2013-05-19 NOTE — H&P (Signed)
  Subjective:   Patient ID: Earl Mueller is a 78 y.o. male.   HPI  Here for evaluation chronic wound vertex scalp.No records to review today. Pt reports several year history ulceration scalp with intermittent bleeding. Biopsy demonstrated SCC and pt underwent Mohs resection 2013 with Dr. Jimmye Norman. The wound was left open. He was referred given size/extent for XRT and completed radiation scalp and continues in f/u with Dr. Jimmye Norman (Dermatology) for skin checks. He has continued with open wound of scalp and family and his Radiation Oncologist Earl Mueller) has encouraged him to seek care. He has discussed flap with his dermatologist.  No current drainage or bleeding. No current wound care.   Review of Systems  Constitutional: Negative.  Cardiovascular:  + leg pain with walking  Endocrine: Negative.  Allergic/Immunologic: Negative.   Objective:   Physical Exam  HENT:  Vertex scalp with open wound 1.5 cm x 1.5 cm with exposed dry bone no drainage  Neck:  R neck scar  Cardiovascular: Normal rate.  Pulmonary/Chest: Effort normal.  Psychiatric: He has a normal mood and affect.   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 Discussed that local flap would have increased wound healing complications given hx XRT; in addition as is scalp with minimal laxity may have secondary donor site to address/heal. Discussed excision of ulcer with burring of bone until bleeding encountered and placement of matrix such as A Cell or Integra. Discussed wound care following this, regular visits to me. If we can develop granulated bed then can plan second procedure for grafting. I am hesitant to offer grafting at time of debridement as I would like to ensure the radiated bed can support this. Discussed GA, outpatient surgery. May need anesthesia evaluation given his  comorbidities. Ok to continue on Plavix and ASA for this procedure given his vascular history.

## 2013-05-19 NOTE — Pre-Procedure Instructions (Signed)
Sheila L Canova  05/19/2013   Your procedure is scheduled on:  05/23/13  Report to [facility]  2 * 3 at 1130 AM.  Call this number if you have problems the morning of surgery: [phone]   Remember:   Do not eat food or drink liquids after midnight.   Take these medicines the morning of surgery with A SIP OF WATER: synthroid,paxil,plental   Do not wear jewelry, make-up or nail polish.  Do not wear lotions, powders, or perfumes. You may wear deodorant.  Do not shave 48 hours prior to surgery. Men may shave face and neck.  Do not bring valuables to the hospital.  [facility] is not responsible                  for any belongings or valuables.               Contacts, dentures or bridgework may not be worn into surgery.  Leave suitcase in the car. After surgery it may be brought to your room.  For patients admitted to the hospital, discharge time is determined by your                treatment team.               Patients discharged the day of surgery will not be allowed to drive  home.  Name and phone number of your driver:   Special Instructions: Shower using CHG 2 nights before surgery and the night before surgery.  If you shower the day of surgery use CHG.  Use special wash - you have one bottle of CHG for all showers.  You should use approximately 1/3 of the bottle for each shower.   Please read over the following fact sheets that you were given: Pain Booklet, Coughing and Deep Breathing and Surgical Site Infection Prevention   

## 2013-05-19 NOTE — Pre-Procedure Instructions (Signed)
ATHOL BOLDS  05/19/2013   Your procedure is scheduled on:  05/23/13  Report to [place]  2 * 3 at 1130 AM.  Call this number if you have problems the morning of surgery: [phone]   Remember:   Do not eat food or drink liquids after midnight.   Take these medicines the morning of surgery with A SIP OF WATER: synthroid,paxil,plental   Do not wear jewelry, make-up or nail polish.  Do not wear lotions, powders, or perfumes. You may wear deodorant.  Do not shave 48 hours prior to surgery. Men may shave face and neck.  Do not bring valuables to the hospital.  [facility] is not responsible                  for any belongings or valuables.               Contacts, dentures or bridgework may not be worn into surgery.  Leave suitcase in the car. After surgery it may be brought to your room.  For patients admitted to the hospital, discharge time is determined by your                treatment team.               Patients discharged the day of surgery will not be allowed to drive  home.  Name and phone number of your driver:   Special Instructions: Shower using CHG 2 nights before surgery and the night before surgery.  If you shower the day of surgery use CHG.  Use special wash - you have one bottle of CHG for all showers.  You should use approximately 1/3 of the bottle for each shower.   Please read over the following fact sheets that you were given: Pain Booklet, Coughing and Deep Breathing and Surgical Site Infection Prevention

## 2013-05-19 NOTE — Pre-Procedure Instructions (Signed)
Earl Mueller  05/19/2013   Your procedure is scheduled on:  05/23/13  Report to [facility]  2 * 3 at 1130 AM.  Call this number if you have problems the morning of surgery: [phone]   Remember:   Do not eat food or drink liquids after midnight.   Take these medicines the morning of surgery with A SIP OF WATER: synthroid,paxil,plental   Do not wear jewelry, make-up or nail polish.  Do not wear lotions, powders, or perfumes. You may wear deodorant.  Do not shave 48 hours prior to surgery. Men may shave face and neck.  Do not bring valuables to the hospital.  [facility] is not responsible                  for any belongings or valuables.               Contacts, dentures or bridgework may not be worn into surgery.  Leave suitcase in the car. After surgery it may be brought to your room.  For patients admitted to the hospital, discharge time is determined by your                treatment team.               Patients discharged the day of surgery will not be allowed to drive  home.  Name and phone number of your driver:   Special Instructions: Shower using CHG 2 nights before surgery and the night before surgery.  If you shower the day of surgery use CHG.  Use special wash - you have one bottle of CHG for all showers.  You should use approximately 1/3 of the bottle for each shower.   Please read over the following fact sheets that you were given: Pain Booklet, Coughing and Deep Breathing and Surgical Site Infection Prevention   

## 2013-05-22 ENCOUNTER — Encounter (HOSPITAL_COMMUNITY): Payer: Self-pay

## 2013-05-22 MED ORDER — CEFAZOLIN SODIUM-DEXTROSE 2-3 GM-% IV SOLR
2.0000 g | INTRAVENOUS | Status: AC
  Administered 2013-05-23: 2 g via INTRAVENOUS
  Filled 2013-05-22: qty 50

## 2013-05-22 NOTE — Progress Notes (Signed)
Anesthesia chart review: Patient is a 78 year old male scheduled for excision scalp ulcer, placement of A-cell Matrix on 05/23/2013 by Dr. Iran Planas.  PAT was Friday, 05/19/13. I was left chart to review following his PAT visit.      History includes AIOD/PAD s/p AFBG 10/15/05 with angioplasty of right limb of graft 05/19/10, former smoker, anxiety, HTN, hypothyroidism, stomach surgery X 2, back surgery, CVA with right eye blindness with underlying carotid artery stenosis s/p right CEA '12. CAD was not listed on his history, but it was noted on prior cardiac cath in 2007. (I updated his health history.) No records were requested either, but it was noted that he saw Dr. Geraldo Pitter who is a cardiologist at [facility]) in [place].  I called for records this morning for expedited records.  His last cardiology visit was 03/02/13.  He was noted to be stable from a cardiac and PAD standpoint. There was no recent stress or echo at [facility] according to front office staff or at [facility].  Records still pending from [facility].  There was no answer (only machine) on both phone numbers listed for patient.  Around 10:20 AM I did leave a voicemail on patient's cell phone for him to call me, but have yet to hear from him.  Meds: MVI, Paxil, ASA 81, Lipitor, Pletal, Plavix, Zetia, fenofibrate, Isordil, Synthroid, Nitro. His PAT RN cannot confirm if his Plavix has been held, and I have not been able to talk with patient.  EKG on 05/19/13 showed NSR, PACs, ST/T wave abnormality, consider lateral ischemia. T wave abnormality is more prominent in aVL, but otherwise probably not significantly changed when compared to prior EKG on 09/08/11 [facility]).  Cardiac cath on 09/29/05 showed: 1. The coronary vessels are relatively small to at most medium in caliber and likely diffusely diseased. There is a 70% diffuse area of stenosis within the proximal left anterior descending that also affects the ostium of a large septal  perforator. Within the proximal circumflex is an area of 70% stenosis and an area of approximately 40-50% stenosis noted within the proximal right coronary artery. There was an element of spasm affecting the proximal coronary artery which improved with intracoronary nitroglycerin. 2. Left ventricular ejection fraction approximately 50% with no mitral regurgitation and left ventricular end diastolic pressure of 4 mmHg. 3. Severe bilateral iliac disease, right greater than left. There is more diffuse disease noted on the right. The renal arteries are patent bilaterally.  Carotid duplex on 04/21/13 showed 16-49% RICA stenosis, right CEA 03/28/13 without stenosis, 41-66% LICA stenosis with mild (< 50%) stenosis of the left carotid bulb. Medical therapy was recommended at that time.  CXR on 05/19/13 showed:  1. Mild chronic interstitial accentuation in the lungs, without acute abnormality.  2. Mild stable cardiomegaly.  3. Bony demineralization.  4. Atherosclerosis.   Preoperative labs noted.  Cr 1.53, currently no comparison labs are available.  K 4.6, glucose 104, H/H 02/22/36.7.  I reviewed currently available information with anesthesiologist Dr. Marcie Bal.  Since patient had cardiology follow-up within the past three months and was felt stable then likely he can proceed if no new CV/CHF issues; however; formal evaluation by his assigned anesthesiologist on the day of surgery to make definitive plan.  [facility]Anesthesiology Phone [phone] 05/22/2013 12:42 PM  Addendum: 05/22/2013 12:50 PM Received records from [facility].  Apparently, he had an abnormal stress test showing anterolateral ischemia in late 2011.  He was in need  of a carotid endarterectomy at the time, so he underwent cardiac cath (with carotid angiogram) on 03/06/10 that showed: Severe stenosis (98%) of the ostial diagonal.  This is a small artery that should be trivial in the setting of a carotid  endarterectomy, otherwise non-obstructive CAD (40% proximal LAD, 30% diffuse mid/distal LAD, 30% proximal LCx, 50% mid LCx, 30% distal LCx, 30% OM2, 40% distal RCA). No LV gram performed due to recent echo with EF 65%. Medical therapy was recommended.

## 2013-05-23 ENCOUNTER — Encounter (HOSPITAL_COMMUNITY): Payer: Medicare PPO | Admitting: Vascular Surgery

## 2013-05-23 ENCOUNTER — Ambulatory Visit (HOSPITAL_COMMUNITY)
Admission: RE | Admit: 2013-05-23 | Discharge: 2013-05-23 | Disposition: A | Discharge status: Home / Self Care | Payer: Medicare PPO | Source: Ambulatory Visit | Attending: Plastic Surgery | Admitting: Plastic Surgery

## 2013-05-23 ENCOUNTER — Encounter (HOSPITAL_COMMUNITY): Payer: Self-pay | Admitting: Anesthesiology

## 2013-05-23 ENCOUNTER — Ambulatory Visit (HOSPITAL_COMMUNITY): Payer: Medicare PPO | Admitting: Anesthesiology

## 2013-05-23 ENCOUNTER — Encounter (HOSPITAL_COMMUNITY)
Admission: RE | Disposition: A | Discharge status: Home / Self Care | Payer: Self-pay | Source: Ambulatory Visit | Attending: Plastic Surgery

## 2013-05-23 DIAGNOSIS — F411 Generalized anxiety disorder: Secondary | ICD-10-CM | POA: Insufficient documentation

## 2013-05-23 DIAGNOSIS — E039 Hypothyroidism, unspecified: Secondary | ICD-10-CM | POA: Insufficient documentation

## 2013-05-23 DIAGNOSIS — L98499 Non-pressure chronic ulcer of skin of other sites with unspecified severity: Secondary | ICD-10-CM | POA: Insufficient documentation

## 2013-05-23 DIAGNOSIS — I2510 Atherosclerotic heart disease of native coronary artery without angina pectoris: Secondary | ICD-10-CM | POA: Insufficient documentation

## 2013-05-23 DIAGNOSIS — Z923 Personal history of irradiation: Secondary | ICD-10-CM | POA: Insufficient documentation

## 2013-05-23 DIAGNOSIS — Z87891 Personal history of nicotine dependence: Secondary | ICD-10-CM | POA: Insufficient documentation

## 2013-05-23 DIAGNOSIS — L98494 Non-pressure chronic ulcer of skin of other sites with necrosis of bone: Secondary | ICD-10-CM

## 2013-05-23 DIAGNOSIS — I739 Peripheral vascular disease, unspecified: Secondary | ICD-10-CM | POA: Insufficient documentation

## 2013-05-23 DIAGNOSIS — Z8673 Personal history of transient ischemic attack (TIA), and cerebral infarction without residual deficits: Secondary | ICD-10-CM | POA: Insufficient documentation

## 2013-05-23 DIAGNOSIS — I10 Essential (primary) hypertension: Secondary | ICD-10-CM | POA: Insufficient documentation

## 2013-05-23 DIAGNOSIS — Z85828 Personal history of other malignant neoplasm of skin: Secondary | ICD-10-CM | POA: Insufficient documentation

## 2013-05-23 HISTORY — PX: SCALP LACERATION REPAIR: SHX6089

## 2013-05-23 SURGERY — REPAIR, LACERATION, SCALP
Anesthesia: General | Site: Head

## 2013-05-23 MED ORDER — LACTATED RINGERS IV SOLN
INTRAVENOUS | Status: DC | PRN
  Administered 2013-05-23: 13:00:00 via INTRAVENOUS

## 2013-05-23 MED ORDER — BUPIVACAINE-EPINEPHRINE 0.25% -1:200000 IJ SOLN
INTRAMUSCULAR | Status: DC | PRN
  Administered 2013-05-23: 30 mL

## 2013-05-23 MED ORDER — SUCCINYLCHOLINE CHLORIDE 20 MG/ML IJ SOLN
INTRAMUSCULAR | Status: DC | PRN
Start: 2013-05-23 — End: 2013-05-23
  Administered 2013-05-23: 80 mg via INTRAVENOUS

## 2013-05-23 MED ORDER — 0.9 % SODIUM CHLORIDE (POUR BTL) OPTIME
TOPICAL | Status: DC | PRN
  Administered 2013-05-23: 1000 mL

## 2013-05-23 MED ORDER — LIDOCAINE HCL (CARDIAC) 20 MG/ML IV SOLN
INTRAVENOUS | Status: AC
  Filled 2013-05-23: qty 5

## 2013-05-23 MED ORDER — FENTANYL CITRATE 0.05 MG/ML IJ SOLN
INTRAMUSCULAR | Status: AC
  Filled 2013-05-23: qty 5

## 2013-05-23 MED ORDER — EPHEDRINE SULFATE 50 MG/ML IJ SOLN
INTRAMUSCULAR | Status: DC | PRN
  Administered 2013-05-23 (×2): 10 mg via INTRAVENOUS

## 2013-05-23 MED ORDER — PHENYLEPHRINE HCL 10 MG/ML IJ SOLN
10.0000 mg | INTRAVENOUS | Status: DC | PRN
  Administered 2013-05-23: 20 ug/min via INTRAVENOUS

## 2013-05-23 MED ORDER — PHENYLEPHRINE 40 MCG/ML (10ML) SYRINGE FOR IV PUSH (FOR BLOOD PRESSURE SUPPORT)
PREFILLED_SYRINGE | INTRAVENOUS | Status: AC
  Filled 2013-05-23: qty 10

## 2013-05-23 MED ORDER — PHENYLEPHRINE HCL 10 MG/ML IJ SOLN
INTRAMUSCULAR | Status: DC | PRN
  Administered 2013-05-23 (×2): 80 ug via INTRAVENOUS

## 2013-05-23 MED ORDER — LACTATED RINGERS IV SOLN
INTRAVENOUS | Status: DC
  Administered 2013-05-23: 50 mL/h via INTRAVENOUS

## 2013-05-23 MED ORDER — PROPOFOL 10 MG/ML IV BOLUS
INTRAVENOUS | Status: AC
  Filled 2013-05-23: qty 20

## 2013-05-23 MED ORDER — BUPIVACAINE-EPINEPHRINE PF 0.25-1:200000 % IJ SOLN
INTRAMUSCULAR | Status: AC
  Filled 2013-05-23: qty 30

## 2013-05-23 MED ORDER — ONDANSETRON HCL 4 MG/2ML IJ SOLN
INTRAMUSCULAR | Status: DC | PRN
  Administered 2013-05-23: 4 mg via INTRAVENOUS

## 2013-05-23 MED ORDER — ARTIFICIAL TEARS OP OINT
TOPICAL_OINTMENT | OPHTHALMIC | Status: DC | PRN
  Administered 2013-05-23: 1 via OPHTHALMIC

## 2013-05-23 MED ORDER — FENTANYL CITRATE 0.05 MG/ML IJ SOLN
INTRAMUSCULAR | Status: DC | PRN
  Administered 2013-05-23 (×2): 50 ug via INTRAVENOUS

## 2013-05-23 MED ORDER — LIDOCAINE HCL (CARDIAC) 20 MG/ML IV SOLN
INTRAVENOUS | Status: DC | PRN
  Administered 2013-05-23: 60 mg via INTRAVENOUS

## 2013-05-23 MED ORDER — PROPOFOL 10 MG/ML IV BOLUS
INTRAVENOUS | Status: DC | PRN
  Administered 2013-05-23: 40 mg via INTRAVENOUS
  Administered 2013-05-23: 100 mg via INTRAVENOUS

## 2013-05-23 MED ORDER — ONDANSETRON HCL 4 MG/2ML IJ SOLN
INTRAMUSCULAR | Status: AC
  Filled 2013-05-23: qty 2

## 2013-05-23 MED ORDER — GLYCOPYRROLATE 0.2 MG/ML IJ SOLN
INTRAMUSCULAR | Status: AC
  Filled 2013-05-23: qty 1

## 2013-05-23 SURGICAL SUPPLY — 43 items
BLADE SURG 10 STRL SS (BLADE) ×3 IMPLANT
BLADE SURG 15 STRL LF DISP TIS (BLADE) ×1 IMPLANT
BLADE SURG 15 STRL SS (BLADE) ×2
BUR EGG ELITE 4.0 (BURR) ×2 IMPLANT
BUR EGG ELITE 4.0MM (BURR) ×1
BURN MATRIX MATRISTEM 7X10 (Orthopedic Implant) ×3 IMPLANT
CANISTER SUCTION 2500CC (MISCELLANEOUS) ×3 IMPLANT
COVER SURGICAL LIGHT HANDLE (MISCELLANEOUS) ×3 IMPLANT
DRAPE NEUROLOGICAL W/INCISE (DRAPES) ×3 IMPLANT
DRSG EMULSION OIL 3X3 NADH (GAUZE/BANDAGES/DRESSINGS) ×3 IMPLANT
ELECT CAUTERY BLADE 6.4 (BLADE) ×3 IMPLANT
ELECT NEEDLE TIP 2.8 STRL (NEEDLE) IMPLANT
ELECT REM PT RETURN 9FT ADLT (ELECTROSURGICAL) ×3
ELECTRODE REM PT RTRN 9FT ADLT (ELECTROSURGICAL) ×1 IMPLANT
GLOVE BIO SURGEON STRL SZ 6.5 (GLOVE) ×2 IMPLANT
GLOVE BIO SURGEONS STRL SZ 6.5 (GLOVE) ×1
GLOVE BIOGEL PI IND STRL 6.5 (GLOVE) ×1 IMPLANT
GLOVE BIOGEL PI INDICATOR 6.5 (GLOVE) ×2
GLOVE ECLIPSE 6.5 STRL STRAW (GLOVE) ×3 IMPLANT
GOWN STRL REUS W/ TWL LRG LVL3 (GOWN DISPOSABLE) ×2 IMPLANT
GOWN STRL REUS W/TWL LRG LVL3 (GOWN DISPOSABLE) ×4
KIT BASIN OR (CUSTOM PROCEDURE TRAY) ×3 IMPLANT
KIT ROOM TURNOVER OR (KITS) ×3 IMPLANT
MICROMATRIX 200MG (Tissue) ×3 IMPLANT
NEEDLE HYPO 25GX1X1/2 BEV (NEEDLE) ×3 IMPLANT
NS IRRIG 1000ML POUR BTL (IV SOLUTION) ×3 IMPLANT
PACK SURGICAL SETUP 50X90 (CUSTOM PROCEDURE TRAY) ×3 IMPLANT
PAD ARMBOARD 7.5X6 YLW CONV (MISCELLANEOUS) ×3 IMPLANT
PENCIL BUTTON HOLSTER BLD 10FT (ELECTRODE) ×3 IMPLANT
SPECIMEN JAR SMALL (MISCELLANEOUS) ×3 IMPLANT
SPONGE GAUZE 4X4 12PLY (GAUZE/BANDAGES/DRESSINGS) ×9 IMPLANT
SPONGE LAP 18X18 X RAY DECT (DISPOSABLE) ×3 IMPLANT
STAPLER VISISTAT 35W (STAPLE) ×3 IMPLANT
SURGILUBE 2OZ TUBE FLIPTOP (MISCELLANEOUS) ×3 IMPLANT
SUT CHROMIC 4 0 PS 2 18 (SUTURE) ×3 IMPLANT
SUT SILK 4 0 P 3 (SUTURE) ×3 IMPLANT
SYR BULB 3OZ (MISCELLANEOUS) ×3 IMPLANT
SYR CONTROL 10ML LL (SYRINGE) ×3 IMPLANT
TOWEL OR 17X24 6PK STRL BLUE (TOWEL DISPOSABLE) ×3 IMPLANT
TOWEL OR 17X26 10 PK STRL BLUE (TOWEL DISPOSABLE) ×3 IMPLANT
TUBE CONNECTING 12'X1/4 (SUCTIONS) ×1
TUBE CONNECTING 12X1/4 (SUCTIONS) ×2 IMPLANT
YANKAUER SUCT BULB TIP NO VENT (SUCTIONS) ×3 IMPLANT

## 2013-05-23 NOTE — Progress Notes (Signed)
Pt is awake & alert, no c/o. O2 sat 91-94 % on RA. Uses IS 1750-2000 cc. Strong, dry cough.

## 2013-05-23 NOTE — Preoperative (Signed)
Beta Blockers   Reason not to administer Beta Blockers:Not Applicable 

## 2013-05-23 NOTE — Anesthesia Postprocedure Evaluation (Signed)
Anesthesia Post Note  Patient: Earl Mueller  Procedure(s) Performed: Procedure(s) (LRB): EXCISION SCALP ULCER DEBRIDEMENT OF SKIN AND BONE WITH PLACEMENT OF A-CELL (N/A)  Anesthesia type: GA  Patient location: PACU  Post pain: Pain level controlled  Post assessment: Post-op Vital signs reviewed  Last Vitals:  Filed Vitals:   05/23/13 1500  BP: 141/61  Pulse: 70  Temp:   Resp: 20    Post vital signs: Reviewed  Level of consciousness: sedated  Complications: No apparent anesthesia complications

## 2013-05-23 NOTE — Interval H&P Note (Signed)
History and Physical Interval Note:  05/23/2013 12:19 PM  Earl Mueller  has presented today for surgery, with the diagnosis of ULCER OF THE SCALP  The various methods of treatment have been discussed with the patient and family. After consideration of risks, benefits and other options for treatment, the patient has consented to  Procedure(s): EXCISION SCALP ULCER DEBRIDEMENT OF SKIN AND BONE WITH PLACEMENT OF A-CELL (N/A) as a surgical intervention .  The patient's history has been reviewed, patient examined, no change in status, stable for surgery.  I have reviewed the patient's chart and labs.  Questions were answered to the patient's satisfaction.     Jennica Tagliaferri

## 2013-05-23 NOTE — Progress Notes (Signed)
Lunch relief by A. Britt RN 

## 2013-05-23 NOTE — Progress Notes (Signed)
Pt encouraged to cough and deep breathe. Taught incentive spirometer. Will cont to monitor.

## 2013-05-23 NOTE — Anesthesia Procedure Notes (Signed)
Procedure Name: Intubation Date/Time: 05/23/2013 1:17 PM Performed by: Erik Obey Pre-anesthesia Checklist: Patient identified, Patient being monitored, Emergency Drugs available, Timeout performed and Suction available Patient Re-evaluated:Patient Re-evaluated prior to inductionOxygen Delivery Method: Circle system utilized Preoxygenation: Pre-oxygenation with 100% oxygen Intubation Type: IV induction Ventilation: Mask ventilation without difficulty and Oral airway inserted - appropriate to patient size Laryngoscope Size: Mac and 3 Grade View: Grade I Tube type: Oral Tube size: 7.5 mm Number of attempts: 1 Airway Equipment and Method: Stylet Placement Confirmation: ETT inserted through vocal cords under direct vision,  positive ETCO2 and breath sounds checked- equal and bilateral Secured at: 21 cm Tube secured with: Tape Dental Injury: Teeth and Oropharynx as per pre-operative assessment

## 2013-05-23 NOTE — Op Note (Signed)
Operative Note   DATE OF OPERATION: 05/23/13  LOCATION: [facility] Main OR- Outpatient  SURGICAL DIVISION: Plastic Surgery  PREOPERATIVE DIAGNOSES:  1. Chronic ulcer scalp with necrotic bone 2. History squamous cell carcinoma scalp 3. History of radiation scalp  POSTOPERATIVE DIAGNOSES:  same  PROCEDURE:  1. Debridement of scalp ulcer including skin, subcutaneous tissue, and bone 5 cm2. Application A Cell matrix 5 cm2  SURGEON: Irene Limbo MD MBA  ASSISTANT: none  ANESTHESIA:  General.   EBL: 10 ml  COMPLICATIONS: None.   INDICATIONS FOR PROCEDURE:  The patient, Earl Mueller, is a 78 y.o. male born on 1936-01-15, is here for debridement scalp ulcer incurred post Mohs resection scalp SCC over 1 year ago followed by radiation treatment.   FINDINGS: Dry exposed bone at vertex was debrided with burr and able to achieve punctate bleeding.   DESCRIPTION OF PROCEDURE:  The patient's operative site was marked with the patient in the preoperative area. The patient was taken to the operating room. SCDs were placed and IV antibiotics were given. The patient's operative site was prepped and draped in a sterile fashion. A time out was performed and all information was confirmed to be correct.  Sharp excision of all skin margins of ulcer was completed with knife. The specimen was marked for pathology. Care was taken to preserve periosteum beneath this skin excision. A burr was used to debride the exposed dry bone devoid of periosteum. This was completed until punctate bleeding was obtained in all areas. The wound was irrigated and hemostasis obtained with cautery. A Cell matrix powder was applied to wound base. A Cell matrix sheet was fenestrated and applied to wound and secured to skin with running 4-0 chromic suture. Adaptic and surgical lubricant applied prior to sterile sponge as bolster. This was secured to scalp with staples.   The patient was allowed to wake from anesthesia, extubated and taken to the  recovery room in satisfactory condition.   SPECIMENS: scalp ulcer  DRAINS: none  Irene Limbo, MD [facility] Plastic & Reconstructive Surgery [phone]

## 2013-05-23 NOTE — Transfer of Care (Signed)
Immediate Anesthesia Transfer of Care Note  Patient: Earl Mueller  Procedure(s) Performed: Procedure(s): EXCISION SCALP ULCER DEBRIDEMENT OF SKIN AND BONE WITH PLACEMENT OF A-CELL (N/A)  Patient Location: PACU  Anesthesia Type:General  Level of Consciousness: awake, alert  and oriented  Airway & Oxygen Therapy: Patient Spontanous Breathing and Patient connected to nasal cannula oxygen  Post-op Assessment: Report given to PACU RN and Post -op Vital signs reviewed and stable  Post vital signs: Reviewed and stable  Complications: No apparent anesthesia complications

## 2013-05-23 NOTE — Anesthesia Preprocedure Evaluation (Signed)
Anesthesia Evaluation  Patient identified by MRN, date of birth, ID band Patient awake    Reviewed: Allergy & Precautions, H&P , Patient's Chart, lab work & pertinent test results, reviewed documented beta blocker date and time   History of Anesthesia Complications Negative for: history of anesthetic complications  Airway Mallampati: II TM Distance: >3 FB Neck ROM: full    Dental   Pulmonary former smoker,  breath sounds clear to auscultation        Cardiovascular Exercise Tolerance: Good hypertension, + CAD and + Peripheral Vascular Disease Rhythm:regular Rate:Normal     Neuro/Psych Anxiety CVA    GI/Hepatic   Endo/Other  Hypothyroidism   Renal/GU      Musculoskeletal   Abdominal   Peds  Hematology   Anesthesia Other Findings   Reproductive/Obstetrics                           Anesthesia Physical Anesthesia Plan  ASA: III  Anesthesia Plan: General ETT   Post-op Pain Management:    Induction:   Airway Management Planned:   Additional Equipment:   Intra-op Plan:   Post-operative Plan:   Informed Consent: I have reviewed the patients History and Physical, chart, labs and discussed the procedure including the risks, benefits and alternatives for the proposed anesthesia with the patient or authorized representative who has indicated his/her understanding and acceptance.   Dental Advisory Given  Plan Discussed with: CRNA and Surgeon  Anesthesia Plan Comments:         Anesthesia Quick Evaluation

## 2013-05-25 ENCOUNTER — Encounter (HOSPITAL_COMMUNITY): Payer: Self-pay | Admitting: Plastic Surgery

## 2014-03-26 DIAGNOSIS — I2510 Atherosclerotic heart disease of native coronary artery without angina pectoris: Secondary | ICD-10-CM | POA: Diagnosis not present

## 2014-03-26 DIAGNOSIS — I739 Peripheral vascular disease, unspecified: Secondary | ICD-10-CM | POA: Diagnosis not present

## 2014-03-26 DIAGNOSIS — Z8673 Personal history of transient ischemic attack (TIA), and cerebral infarction without residual deficits: Secondary | ICD-10-CM | POA: Diagnosis not present

## 2014-03-26 DIAGNOSIS — I10 Essential (primary) hypertension: Secondary | ICD-10-CM | POA: Diagnosis not present

## 2014-03-26 DIAGNOSIS — E785 Hyperlipidemia, unspecified: Secondary | ICD-10-CM | POA: Diagnosis not present

## 2014-03-29 DIAGNOSIS — I10 Essential (primary) hypertension: Secondary | ICD-10-CM | POA: Diagnosis not present

## 2014-03-29 DIAGNOSIS — Z0000 Encounter for general adult medical examination without abnormal findings: Secondary | ICD-10-CM | POA: Diagnosis not present

## 2014-04-05 DIAGNOSIS — I2510 Atherosclerotic heart disease of native coronary artery without angina pectoris: Secondary | ICD-10-CM | POA: Diagnosis not present

## 2014-04-05 DIAGNOSIS — I10 Essential (primary) hypertension: Secondary | ICD-10-CM | POA: Diagnosis not present

## 2014-05-02 DIAGNOSIS — Z792 Long term (current) use of antibiotics: Secondary | ICD-10-CM | POA: Diagnosis not present

## 2014-05-02 DIAGNOSIS — I2510 Atherosclerotic heart disease of native coronary artery without angina pectoris: Secondary | ICD-10-CM | POA: Diagnosis not present

## 2014-05-02 DIAGNOSIS — Z7982 Long term (current) use of aspirin: Secondary | ICD-10-CM | POA: Diagnosis not present

## 2014-05-02 DIAGNOSIS — I509 Heart failure, unspecified: Secondary | ICD-10-CM | POA: Diagnosis not present

## 2014-05-02 DIAGNOSIS — K115 Sialolithiasis: Secondary | ICD-10-CM | POA: Diagnosis not present

## 2014-05-02 DIAGNOSIS — I10 Essential (primary) hypertension: Secondary | ICD-10-CM | POA: Diagnosis not present

## 2014-05-02 DIAGNOSIS — E780 Pure hypercholesterolemia: Secondary | ICD-10-CM | POA: Diagnosis not present

## 2014-05-02 DIAGNOSIS — R599 Enlarged lymph nodes, unspecified: Secondary | ICD-10-CM | POA: Diagnosis not present

## 2014-05-02 DIAGNOSIS — Z87891 Personal history of nicotine dependence: Secondary | ICD-10-CM | POA: Diagnosis not present

## 2014-05-07 DIAGNOSIS — K117 Disturbances of salivary secretion: Secondary | ICD-10-CM | POA: Diagnosis not present

## 2014-05-11 DIAGNOSIS — R221 Localized swelling, mass and lump, neck: Secondary | ICD-10-CM | POA: Diagnosis not present

## 2014-05-11 DIAGNOSIS — H6123 Impacted cerumen, bilateral: Secondary | ICD-10-CM | POA: Diagnosis not present

## 2014-05-11 DIAGNOSIS — J342 Deviated nasal septum: Secondary | ICD-10-CM | POA: Diagnosis not present

## 2014-05-29 DIAGNOSIS — I2510 Atherosclerotic heart disease of native coronary artery without angina pectoris: Secondary | ICD-10-CM | POA: Diagnosis not present

## 2014-05-29 DIAGNOSIS — R221 Localized swelling, mass and lump, neck: Secondary | ICD-10-CM | POA: Diagnosis not present

## 2014-05-29 DIAGNOSIS — K115 Sialolithiasis: Secondary | ICD-10-CM | POA: Diagnosis not present

## 2014-06-07 DIAGNOSIS — J342 Deviated nasal septum: Secondary | ICD-10-CM | POA: Diagnosis not present

## 2014-06-07 DIAGNOSIS — K115 Sialolithiasis: Secondary | ICD-10-CM | POA: Diagnosis not present

## 2014-06-07 DIAGNOSIS — D37032 Neoplasm of uncertain behavior of the submandibular salivary glands: Secondary | ICD-10-CM | POA: Diagnosis not present

## 2014-06-07 DIAGNOSIS — D37 Neoplasm of uncertain behavior of oral cavity and digestive organs: Secondary | ICD-10-CM | POA: Diagnosis not present

## 2014-06-15 DIAGNOSIS — I70213 Atherosclerosis of native arteries of extremities with intermittent claudication, bilateral legs: Secondary | ICD-10-CM | POA: Diagnosis not present

## 2014-06-15 DIAGNOSIS — E780 Pure hypercholesterolemia: Secondary | ICD-10-CM | POA: Diagnosis not present

## 2014-06-15 DIAGNOSIS — Z125 Encounter for screening for malignant neoplasm of prostate: Secondary | ICD-10-CM | POA: Diagnosis not present

## 2014-06-15 DIAGNOSIS — E782 Mixed hyperlipidemia: Secondary | ICD-10-CM | POA: Diagnosis not present

## 2014-06-15 DIAGNOSIS — I2510 Atherosclerotic heart disease of native coronary artery without angina pectoris: Secondary | ICD-10-CM | POA: Diagnosis not present

## 2014-06-15 DIAGNOSIS — I10 Essential (primary) hypertension: Secondary | ICD-10-CM | POA: Diagnosis not present

## 2014-06-15 DIAGNOSIS — Z01810 Encounter for preprocedural cardiovascular examination: Secondary | ICD-10-CM | POA: Diagnosis not present

## 2014-06-15 DIAGNOSIS — R233 Spontaneous ecchymoses: Secondary | ICD-10-CM | POA: Diagnosis not present

## 2014-06-15 DIAGNOSIS — R7301 Impaired fasting glucose: Secondary | ICD-10-CM | POA: Diagnosis not present

## 2014-06-18 DIAGNOSIS — Z01818 Encounter for other preprocedural examination: Secondary | ICD-10-CM | POA: Diagnosis not present

## 2014-06-18 DIAGNOSIS — J449 Chronic obstructive pulmonary disease, unspecified: Secondary | ICD-10-CM | POA: Diagnosis not present

## 2014-06-22 DIAGNOSIS — M6530 Trigger finger, unspecified finger: Secondary | ICD-10-CM | POA: Diagnosis not present

## 2014-07-03 DIAGNOSIS — R221 Localized swelling, mass and lump, neck: Secondary | ICD-10-CM | POA: Diagnosis not present

## 2014-07-03 DIAGNOSIS — I2510 Atherosclerotic heart disease of native coronary artery without angina pectoris: Secondary | ICD-10-CM | POA: Diagnosis not present

## 2014-07-03 DIAGNOSIS — K118 Other diseases of salivary glands: Secondary | ICD-10-CM | POA: Diagnosis not present

## 2014-07-03 DIAGNOSIS — Z9889 Other specified postprocedural states: Secondary | ICD-10-CM | POA: Diagnosis not present

## 2014-07-03 DIAGNOSIS — K1123 Chronic sialoadenitis: Secondary | ICD-10-CM | POA: Diagnosis not present

## 2014-07-03 DIAGNOSIS — K115 Sialolithiasis: Secondary | ICD-10-CM | POA: Diagnosis not present

## 2014-07-03 DIAGNOSIS — D37032 Neoplasm of uncertain behavior of the submandibular salivary glands: Secondary | ICD-10-CM | POA: Diagnosis not present

## 2014-07-03 DIAGNOSIS — M96830 Postprocedural hemorrhage and hematoma of a musculoskeletal structure following a musculoskeletal system procedure: Secondary | ICD-10-CM | POA: Diagnosis not present

## 2014-07-04 DIAGNOSIS — M96830 Postprocedural hemorrhage and hematoma of a musculoskeletal structure following a musculoskeletal system procedure: Secondary | ICD-10-CM | POA: Diagnosis not present

## 2014-07-04 DIAGNOSIS — K118 Other diseases of salivary glands: Secondary | ICD-10-CM | POA: Diagnosis not present

## 2014-07-04 DIAGNOSIS — J9601 Acute respiratory failure with hypoxia: Secondary | ICD-10-CM | POA: Diagnosis not present

## 2014-07-04 DIAGNOSIS — I509 Heart failure, unspecified: Secondary | ICD-10-CM | POA: Diagnosis not present

## 2014-07-04 DIAGNOSIS — T889XXD Complication of surgical and medical care, unspecified, subsequent encounter: Secondary | ICD-10-CM | POA: Diagnosis not present

## 2014-07-04 DIAGNOSIS — Z9911 Dependence on respirator [ventilator] status: Secondary | ICD-10-CM | POA: Diagnosis not present

## 2014-07-04 DIAGNOSIS — B961 Klebsiella pneumoniae [K. pneumoniae] as the cause of diseases classified elsewhere: Secondary | ICD-10-CM | POA: Diagnosis not present

## 2014-07-04 DIAGNOSIS — J9690 Respiratory failure, unspecified, unspecified whether with hypoxia or hypercapnia: Secondary | ICD-10-CM | POA: Diagnosis not present

## 2014-07-04 DIAGNOSIS — I350 Nonrheumatic aortic (valve) stenosis: Secondary | ICD-10-CM | POA: Diagnosis not present

## 2014-07-04 DIAGNOSIS — K91840 Postprocedural hemorrhage and hematoma of a digestive system organ or structure following a digestive system procedure: Secondary | ICD-10-CM | POA: Diagnosis not present

## 2014-07-04 DIAGNOSIS — Z4682 Encounter for fitting and adjustment of non-vascular catheter: Secondary | ICD-10-CM | POA: Diagnosis not present

## 2014-07-04 DIAGNOSIS — Z9889 Other specified postprocedural states: Secondary | ICD-10-CM | POA: Diagnosis not present

## 2014-07-04 DIAGNOSIS — I2510 Atherosclerotic heart disease of native coronary artery without angina pectoris: Secondary | ICD-10-CM | POA: Diagnosis not present

## 2014-07-04 DIAGNOSIS — K91841 Postprocedural hemorrhage and hematoma of a digestive system organ or structure following other procedure: Secondary | ICD-10-CM | POA: Diagnosis not present

## 2014-07-04 DIAGNOSIS — J9811 Atelectasis: Secondary | ICD-10-CM | POA: Diagnosis not present

## 2014-07-04 DIAGNOSIS — R748 Abnormal levels of other serum enzymes: Secondary | ICD-10-CM | POA: Diagnosis not present

## 2014-07-04 DIAGNOSIS — J988 Other specified respiratory disorders: Secondary | ICD-10-CM | POA: Diagnosis not present

## 2014-07-04 DIAGNOSIS — I214 Non-ST elevation (NSTEMI) myocardial infarction: Secondary | ICD-10-CM | POA: Diagnosis not present

## 2014-07-04 DIAGNOSIS — I10 Essential (primary) hypertension: Secondary | ICD-10-CM | POA: Diagnosis not present

## 2014-07-04 DIAGNOSIS — J90 Pleural effusion, not elsewhere classified: Secondary | ICD-10-CM | POA: Diagnosis not present

## 2014-07-04 DIAGNOSIS — R221 Localized swelling, mass and lump, neck: Secondary | ICD-10-CM | POA: Diagnosis not present

## 2014-07-04 DIAGNOSIS — R918 Other nonspecific abnormal finding of lung field: Secondary | ICD-10-CM | POA: Diagnosis not present

## 2014-07-04 DIAGNOSIS — C4492 Squamous cell carcinoma of skin, unspecified: Secondary | ICD-10-CM | POA: Diagnosis not present

## 2014-07-04 DIAGNOSIS — T148 Other injury of unspecified body region: Secondary | ICD-10-CM | POA: Diagnosis not present

## 2014-07-04 DIAGNOSIS — I361 Nonrheumatic tricuspid (valve) insufficiency: Secondary | ICD-10-CM | POA: Diagnosis not present

## 2014-07-12 DIAGNOSIS — Z4801 Encounter for change or removal of surgical wound dressing: Secondary | ICD-10-CM | POA: Diagnosis not present

## 2014-07-12 DIAGNOSIS — I2510 Atherosclerotic heart disease of native coronary artery without angina pectoris: Secondary | ICD-10-CM | POA: Diagnosis not present

## 2014-07-30 DIAGNOSIS — I10 Essential (primary) hypertension: Secondary | ICD-10-CM | POA: Diagnosis not present

## 2014-07-30 DIAGNOSIS — E785 Hyperlipidemia, unspecified: Secondary | ICD-10-CM | POA: Diagnosis not present

## 2014-07-30 DIAGNOSIS — I2510 Atherosclerotic heart disease of native coronary artery without angina pectoris: Secondary | ICD-10-CM | POA: Diagnosis not present

## 2014-09-14 DIAGNOSIS — E780 Pure hypercholesterolemia: Secondary | ICD-10-CM | POA: Diagnosis not present

## 2014-09-14 DIAGNOSIS — C4442 Squamous cell carcinoma of skin of scalp and neck: Secondary | ICD-10-CM | POA: Diagnosis not present

## 2014-09-14 DIAGNOSIS — I2510 Atherosclerotic heart disease of native coronary artery without angina pectoris: Secondary | ICD-10-CM | POA: Diagnosis not present

## 2014-09-14 DIAGNOSIS — I1310 Hypertensive heart and chronic kidney disease without heart failure, with stage 1 through stage 4 chronic kidney disease, or unspecified chronic kidney disease: Secondary | ICD-10-CM | POA: Diagnosis not present

## 2014-09-14 DIAGNOSIS — H6123 Impacted cerumen, bilateral: Secondary | ICD-10-CM | POA: Diagnosis not present

## 2014-09-14 DIAGNOSIS — I10 Essential (primary) hypertension: Secondary | ICD-10-CM | POA: Diagnosis not present

## 2014-09-14 DIAGNOSIS — R7301 Impaired fasting glucose: Secondary | ICD-10-CM | POA: Diagnosis not present

## 2014-09-14 DIAGNOSIS — I70213 Atherosclerosis of native arteries of extremities with intermittent claudication, bilateral legs: Secondary | ICD-10-CM | POA: Diagnosis not present

## 2014-10-11 DIAGNOSIS — H52222 Regular astigmatism, left eye: Secondary | ICD-10-CM | POA: Diagnosis not present

## 2014-10-11 DIAGNOSIS — H43813 Vitreous degeneration, bilateral: Secondary | ICD-10-CM | POA: Diagnosis not present

## 2014-10-11 DIAGNOSIS — H43393 Other vitreous opacities, bilateral: Secondary | ICD-10-CM | POA: Diagnosis not present

## 2014-10-11 DIAGNOSIS — H25811 Combined forms of age-related cataract, right eye: Secondary | ICD-10-CM | POA: Diagnosis not present

## 2014-10-11 DIAGNOSIS — I10 Essential (primary) hypertension: Secondary | ICD-10-CM | POA: Diagnosis not present

## 2014-10-11 DIAGNOSIS — H524 Presbyopia: Secondary | ICD-10-CM | POA: Diagnosis not present

## 2014-10-30 DIAGNOSIS — C4442 Squamous cell carcinoma of skin of scalp and neck: Secondary | ICD-10-CM | POA: Diagnosis not present

## 2014-10-30 DIAGNOSIS — L570 Actinic keratosis: Secondary | ICD-10-CM | POA: Diagnosis not present

## 2014-11-20 DIAGNOSIS — E785 Hyperlipidemia, unspecified: Secondary | ICD-10-CM | POA: Diagnosis not present

## 2014-11-20 DIAGNOSIS — Z8673 Personal history of transient ischemic attack (TIA), and cerebral infarction without residual deficits: Secondary | ICD-10-CM | POA: Insufficient documentation

## 2014-11-20 DIAGNOSIS — I739 Peripheral vascular disease, unspecified: Secondary | ICD-10-CM | POA: Insufficient documentation

## 2014-11-20 DIAGNOSIS — I25119 Atherosclerotic heart disease of native coronary artery with unspecified angina pectoris: Secondary | ICD-10-CM | POA: Insufficient documentation

## 2014-11-20 DIAGNOSIS — I10 Essential (primary) hypertension: Secondary | ICD-10-CM

## 2014-11-20 DIAGNOSIS — I2510 Atherosclerotic heart disease of native coronary artery without angina pectoris: Secondary | ICD-10-CM

## 2014-11-20 HISTORY — DX: Atherosclerotic heart disease of native coronary artery without angina pectoris: I25.10

## 2014-11-20 HISTORY — DX: Essential (primary) hypertension: I10

## 2014-12-28 DIAGNOSIS — I10 Essential (primary) hypertension: Secondary | ICD-10-CM | POA: Diagnosis not present

## 2014-12-28 DIAGNOSIS — E7800 Pure hypercholesterolemia, unspecified: Secondary | ICD-10-CM | POA: Diagnosis not present

## 2014-12-28 DIAGNOSIS — R7301 Impaired fasting glucose: Secondary | ICD-10-CM | POA: Diagnosis not present

## 2015-04-04 DIAGNOSIS — I70213 Atherosclerosis of native arteries of extremities with intermittent claudication, bilateral legs: Secondary | ICD-10-CM | POA: Diagnosis not present

## 2015-04-04 DIAGNOSIS — I119 Hypertensive heart disease without heart failure: Secondary | ICD-10-CM | POA: Diagnosis not present

## 2015-04-04 DIAGNOSIS — C4442 Squamous cell carcinoma of skin of scalp and neck: Secondary | ICD-10-CM | POA: Diagnosis not present

## 2015-04-04 DIAGNOSIS — R7301 Impaired fasting glucose: Secondary | ICD-10-CM | POA: Diagnosis not present

## 2015-04-04 DIAGNOSIS — I10 Essential (primary) hypertension: Secondary | ICD-10-CM | POA: Diagnosis not present

## 2015-04-04 DIAGNOSIS — H47011 Ischemic optic neuropathy, right eye: Secondary | ICD-10-CM | POA: Diagnosis not present

## 2015-04-04 DIAGNOSIS — M65331 Trigger finger, right middle finger: Secondary | ICD-10-CM | POA: Diagnosis not present

## 2015-04-04 DIAGNOSIS — E782 Mixed hyperlipidemia: Secondary | ICD-10-CM | POA: Diagnosis not present

## 2015-04-04 DIAGNOSIS — I2510 Atherosclerotic heart disease of native coronary artery without angina pectoris: Secondary | ICD-10-CM | POA: Diagnosis not present

## 2015-04-05 DIAGNOSIS — M65331 Trigger finger, right middle finger: Secondary | ICD-10-CM | POA: Diagnosis not present

## 2015-05-23 DIAGNOSIS — I2510 Atherosclerotic heart disease of native coronary artery without angina pectoris: Secondary | ICD-10-CM | POA: Diagnosis not present

## 2015-05-23 DIAGNOSIS — I10 Essential (primary) hypertension: Secondary | ICD-10-CM | POA: Diagnosis not present

## 2015-05-23 DIAGNOSIS — Z8673 Personal history of transient ischemic attack (TIA), and cerebral infarction without residual deficits: Secondary | ICD-10-CM | POA: Diagnosis not present

## 2015-05-23 DIAGNOSIS — E785 Hyperlipidemia, unspecified: Secondary | ICD-10-CM | POA: Diagnosis not present

## 2015-05-23 DIAGNOSIS — I739 Peripheral vascular disease, unspecified: Secondary | ICD-10-CM | POA: Diagnosis not present

## 2015-06-21 DIAGNOSIS — H6123 Impacted cerumen, bilateral: Secondary | ICD-10-CM | POA: Diagnosis not present

## 2015-07-15 DIAGNOSIS — I119 Hypertensive heart disease without heart failure: Secondary | ICD-10-CM | POA: Diagnosis not present

## 2015-07-15 DIAGNOSIS — E782 Mixed hyperlipidemia: Secondary | ICD-10-CM | POA: Diagnosis not present

## 2015-07-15 DIAGNOSIS — R7301 Impaired fasting glucose: Secondary | ICD-10-CM | POA: Diagnosis not present

## 2015-07-18 DIAGNOSIS — R7301 Impaired fasting glucose: Secondary | ICD-10-CM | POA: Diagnosis not present

## 2015-07-18 DIAGNOSIS — I119 Hypertensive heart disease without heart failure: Secondary | ICD-10-CM | POA: Diagnosis not present

## 2015-07-18 DIAGNOSIS — I70213 Atherosclerosis of native arteries of extremities with intermittent claudication, bilateral legs: Secondary | ICD-10-CM | POA: Diagnosis not present

## 2015-07-18 DIAGNOSIS — C4442 Squamous cell carcinoma of skin of scalp and neck: Secondary | ICD-10-CM | POA: Diagnosis not present

## 2015-07-18 DIAGNOSIS — E782 Mixed hyperlipidemia: Secondary | ICD-10-CM | POA: Diagnosis not present

## 2015-07-18 DIAGNOSIS — E876 Hypokalemia: Secondary | ICD-10-CM | POA: Diagnosis not present

## 2015-07-18 DIAGNOSIS — I2510 Atherosclerotic heart disease of native coronary artery without angina pectoris: Secondary | ICD-10-CM | POA: Diagnosis not present

## 2015-07-18 DIAGNOSIS — H47011 Ischemic optic neuropathy, right eye: Secondary | ICD-10-CM | POA: Diagnosis not present

## 2015-09-18 DIAGNOSIS — L720 Epidermal cyst: Secondary | ICD-10-CM | POA: Diagnosis not present

## 2015-09-18 DIAGNOSIS — L0201 Cutaneous abscess of face: Secondary | ICD-10-CM | POA: Diagnosis not present

## 2015-10-24 DIAGNOSIS — R7301 Impaired fasting glucose: Secondary | ICD-10-CM | POA: Diagnosis not present

## 2015-10-24 DIAGNOSIS — Z125 Encounter for screening for malignant neoplasm of prostate: Secondary | ICD-10-CM | POA: Diagnosis not present

## 2015-10-24 DIAGNOSIS — I119 Hypertensive heart disease without heart failure: Secondary | ICD-10-CM | POA: Diagnosis not present

## 2015-10-24 DIAGNOSIS — I10 Essential (primary) hypertension: Secondary | ICD-10-CM | POA: Diagnosis not present

## 2015-10-24 DIAGNOSIS — E782 Mixed hyperlipidemia: Secondary | ICD-10-CM | POA: Diagnosis not present

## 2015-10-28 DIAGNOSIS — H5210 Myopia, unspecified eye: Secondary | ICD-10-CM | POA: Diagnosis not present

## 2015-10-28 DIAGNOSIS — H524 Presbyopia: Secondary | ICD-10-CM | POA: Diagnosis not present

## 2015-10-30 DIAGNOSIS — C4442 Squamous cell carcinoma of skin of scalp and neck: Secondary | ICD-10-CM | POA: Diagnosis not present

## 2015-10-30 DIAGNOSIS — E782 Mixed hyperlipidemia: Secondary | ICD-10-CM | POA: Diagnosis not present

## 2015-10-30 DIAGNOSIS — I119 Hypertensive heart disease without heart failure: Secondary | ICD-10-CM | POA: Diagnosis not present

## 2015-10-30 DIAGNOSIS — H47011 Ischemic optic neuropathy, right eye: Secondary | ICD-10-CM | POA: Diagnosis not present

## 2015-10-30 DIAGNOSIS — I2510 Atherosclerotic heart disease of native coronary artery without angina pectoris: Secondary | ICD-10-CM | POA: Diagnosis not present

## 2015-10-30 DIAGNOSIS — I70213 Atherosclerosis of native arteries of extremities with intermittent claudication, bilateral legs: Secondary | ICD-10-CM | POA: Diagnosis not present

## 2015-10-30 DIAGNOSIS — Z125 Encounter for screening for malignant neoplasm of prostate: Secondary | ICD-10-CM | POA: Diagnosis not present

## 2015-10-30 DIAGNOSIS — E876 Hypokalemia: Secondary | ICD-10-CM | POA: Diagnosis not present

## 2015-10-30 DIAGNOSIS — R7301 Impaired fasting glucose: Secondary | ICD-10-CM | POA: Diagnosis not present

## 2015-11-28 DIAGNOSIS — I2510 Atherosclerotic heart disease of native coronary artery without angina pectoris: Secondary | ICD-10-CM | POA: Diagnosis not present

## 2015-11-28 DIAGNOSIS — Z8673 Personal history of transient ischemic attack (TIA), and cerebral infarction without residual deficits: Secondary | ICD-10-CM | POA: Diagnosis not present

## 2015-11-28 DIAGNOSIS — I739 Peripheral vascular disease, unspecified: Secondary | ICD-10-CM | POA: Diagnosis not present

## 2015-11-28 DIAGNOSIS — E785 Hyperlipidemia, unspecified: Secondary | ICD-10-CM | POA: Diagnosis not present

## 2015-11-28 DIAGNOSIS — I10 Essential (primary) hypertension: Secondary | ICD-10-CM | POA: Diagnosis not present

## 2016-01-23 DIAGNOSIS — H6123 Impacted cerumen, bilateral: Secondary | ICD-10-CM | POA: Diagnosis not present

## 2016-01-30 DIAGNOSIS — E782 Mixed hyperlipidemia: Secondary | ICD-10-CM | POA: Diagnosis not present

## 2016-01-30 DIAGNOSIS — R7301 Impaired fasting glucose: Secondary | ICD-10-CM | POA: Diagnosis not present

## 2016-01-30 DIAGNOSIS — R946 Abnormal results of thyroid function studies: Secondary | ICD-10-CM | POA: Diagnosis not present

## 2016-02-03 DIAGNOSIS — I70213 Atherosclerosis of native arteries of extremities with intermittent claudication, bilateral legs: Secondary | ICD-10-CM | POA: Diagnosis not present

## 2016-02-03 DIAGNOSIS — H47011 Ischemic optic neuropathy, right eye: Secondary | ICD-10-CM | POA: Diagnosis not present

## 2016-02-03 DIAGNOSIS — I2510 Atherosclerotic heart disease of native coronary artery without angina pectoris: Secondary | ICD-10-CM | POA: Diagnosis not present

## 2016-02-03 DIAGNOSIS — E782 Mixed hyperlipidemia: Secondary | ICD-10-CM | POA: Diagnosis not present

## 2016-02-03 DIAGNOSIS — I119 Hypertensive heart disease without heart failure: Secondary | ICD-10-CM | POA: Diagnosis not present

## 2016-02-03 DIAGNOSIS — E038 Other specified hypothyroidism: Secondary | ICD-10-CM | POA: Diagnosis not present

## 2016-02-03 DIAGNOSIS — R7301 Impaired fasting glucose: Secondary | ICD-10-CM | POA: Diagnosis not present

## 2016-02-03 DIAGNOSIS — H6122 Impacted cerumen, left ear: Secondary | ICD-10-CM | POA: Diagnosis not present

## 2016-02-21 DIAGNOSIS — H6123 Impacted cerumen, bilateral: Secondary | ICD-10-CM | POA: Diagnosis not present

## 2016-03-12 DIAGNOSIS — Z681 Body mass index (BMI) 19 or less, adult: Secondary | ICD-10-CM | POA: Diagnosis not present

## 2016-03-12 DIAGNOSIS — Z1211 Encounter for screening for malignant neoplasm of colon: Secondary | ICD-10-CM | POA: Diagnosis not present

## 2016-03-12 DIAGNOSIS — Z0000 Encounter for general adult medical examination without abnormal findings: Secondary | ICD-10-CM | POA: Diagnosis not present

## 2016-04-23 DIAGNOSIS — J018 Other acute sinusitis: Secondary | ICD-10-CM | POA: Diagnosis not present

## 2016-04-23 DIAGNOSIS — J09X2 Influenza due to identified novel influenza A virus with other respiratory manifestations: Secondary | ICD-10-CM | POA: Diagnosis not present

## 2016-05-06 DIAGNOSIS — R7301 Impaired fasting glucose: Secondary | ICD-10-CM | POA: Diagnosis not present

## 2016-05-06 DIAGNOSIS — E782 Mixed hyperlipidemia: Secondary | ICD-10-CM | POA: Diagnosis not present

## 2016-05-06 DIAGNOSIS — E038 Other specified hypothyroidism: Secondary | ICD-10-CM | POA: Diagnosis not present

## 2016-05-06 DIAGNOSIS — I119 Hypertensive heart disease without heart failure: Secondary | ICD-10-CM | POA: Diagnosis not present

## 2016-05-08 DIAGNOSIS — R7301 Impaired fasting glucose: Secondary | ICD-10-CM | POA: Diagnosis not present

## 2016-05-08 DIAGNOSIS — I119 Hypertensive heart disease without heart failure: Secondary | ICD-10-CM | POA: Diagnosis not present

## 2016-05-08 DIAGNOSIS — H47011 Ischemic optic neuropathy, right eye: Secondary | ICD-10-CM | POA: Diagnosis not present

## 2016-05-08 DIAGNOSIS — I70213 Atherosclerosis of native arteries of extremities with intermittent claudication, bilateral legs: Secondary | ICD-10-CM | POA: Diagnosis not present

## 2016-05-08 DIAGNOSIS — M65331 Trigger finger, right middle finger: Secondary | ICD-10-CM | POA: Diagnosis not present

## 2016-05-08 DIAGNOSIS — I2510 Atherosclerotic heart disease of native coronary artery without angina pectoris: Secondary | ICD-10-CM | POA: Diagnosis not present

## 2016-05-08 DIAGNOSIS — E038 Other specified hypothyroidism: Secondary | ICD-10-CM | POA: Diagnosis not present

## 2016-05-08 DIAGNOSIS — E782 Mixed hyperlipidemia: Secondary | ICD-10-CM | POA: Diagnosis not present

## 2016-05-28 DIAGNOSIS — I2510 Atherosclerotic heart disease of native coronary artery without angina pectoris: Secondary | ICD-10-CM | POA: Diagnosis not present

## 2016-05-28 DIAGNOSIS — I739 Peripheral vascular disease, unspecified: Secondary | ICD-10-CM | POA: Diagnosis not present

## 2016-05-28 DIAGNOSIS — I10 Essential (primary) hypertension: Secondary | ICD-10-CM | POA: Diagnosis not present

## 2016-05-28 DIAGNOSIS — E785 Hyperlipidemia, unspecified: Secondary | ICD-10-CM | POA: Diagnosis not present

## 2016-05-28 DIAGNOSIS — Z8673 Personal history of transient ischemic attack (TIA), and cerebral infarction without residual deficits: Secondary | ICD-10-CM | POA: Diagnosis not present

## 2016-06-16 DIAGNOSIS — S61411A Laceration without foreign body of right hand, initial encounter: Secondary | ICD-10-CM | POA: Diagnosis not present

## 2016-06-16 DIAGNOSIS — S61451A Open bite of right hand, initial encounter: Secondary | ICD-10-CM | POA: Diagnosis not present

## 2016-06-18 DIAGNOSIS — S61451A Open bite of right hand, initial encounter: Secondary | ICD-10-CM | POA: Diagnosis not present

## 2016-06-18 DIAGNOSIS — H6123 Impacted cerumen, bilateral: Secondary | ICD-10-CM | POA: Diagnosis not present

## 2016-06-18 DIAGNOSIS — E039 Hypothyroidism, unspecified: Secondary | ICD-10-CM | POA: Diagnosis not present

## 2016-06-18 DIAGNOSIS — E038 Other specified hypothyroidism: Secondary | ICD-10-CM | POA: Diagnosis not present

## 2016-06-19 DIAGNOSIS — H6123 Impacted cerumen, bilateral: Secondary | ICD-10-CM | POA: Diagnosis not present

## 2016-11-03 DIAGNOSIS — R7301 Impaired fasting glucose: Secondary | ICD-10-CM | POA: Diagnosis not present

## 2016-11-03 DIAGNOSIS — E039 Hypothyroidism, unspecified: Secondary | ICD-10-CM | POA: Diagnosis not present

## 2016-11-03 DIAGNOSIS — I119 Hypertensive heart disease without heart failure: Secondary | ICD-10-CM | POA: Diagnosis not present

## 2016-11-03 DIAGNOSIS — E782 Mixed hyperlipidemia: Secondary | ICD-10-CM | POA: Diagnosis not present

## 2016-11-06 DIAGNOSIS — E038 Other specified hypothyroidism: Secondary | ICD-10-CM | POA: Diagnosis not present

## 2016-11-06 DIAGNOSIS — I119 Hypertensive heart disease without heart failure: Secondary | ICD-10-CM | POA: Diagnosis not present

## 2016-11-06 DIAGNOSIS — R7301 Impaired fasting glucose: Secondary | ICD-10-CM | POA: Diagnosis not present

## 2016-11-06 DIAGNOSIS — H6123 Impacted cerumen, bilateral: Secondary | ICD-10-CM | POA: Diagnosis not present

## 2016-11-06 DIAGNOSIS — E782 Mixed hyperlipidemia: Secondary | ICD-10-CM | POA: Diagnosis not present

## 2016-11-25 ENCOUNTER — Ambulatory Visit (INDEPENDENT_AMBULATORY_CARE_PROVIDER_SITE_OTHER): Payer: Medicare PPO | Admitting: Cardiology

## 2016-11-25 ENCOUNTER — Encounter: Payer: Self-pay | Admitting: Cardiology

## 2016-11-25 VITALS — BP 124/68 | HR 87 | Ht 70.0 in | Wt 146.8 lb

## 2016-11-25 DIAGNOSIS — Z8673 Personal history of transient ischemic attack (TIA), and cerebral infarction without residual deficits: Secondary | ICD-10-CM | POA: Diagnosis not present

## 2016-11-25 DIAGNOSIS — I2510 Atherosclerotic heart disease of native coronary artery without angina pectoris: Secondary | ICD-10-CM | POA: Diagnosis not present

## 2016-11-25 DIAGNOSIS — I10 Essential (primary) hypertension: Secondary | ICD-10-CM

## 2016-11-25 DIAGNOSIS — R0989 Other specified symptoms and signs involving the circulatory and respiratory systems: Secondary | ICD-10-CM | POA: Diagnosis not present

## 2016-11-25 DIAGNOSIS — I739 Peripheral vascular disease, unspecified: Secondary | ICD-10-CM

## 2016-11-25 DIAGNOSIS — E785 Hyperlipidemia, unspecified: Secondary | ICD-10-CM | POA: Diagnosis not present

## 2016-11-25 HISTORY — DX: Other specified symptoms and signs involving the circulatory and respiratory systems: R09.89

## 2016-11-25 NOTE — Patient Instructions (Signed)
Medication Instructions:  Your physician recommends that you continue on your current medications as directed. Please refer to the Current Medication list given to you today.  Labwork: None  Testing/Procedures: Your physician has requested that you have a carotid duplex. This test is an ultrasound of the carotid arteries in your neck. It looks at blood flow through these arteries that supply the brain with blood. Allow one hour for this exam. There are no restrictions or special instructions.  Follow-Up: Your physician wants you to follow-up in: 6 months. You will receive a reminder letter in the mail two months in advance. If you don't receive a letter, please call our office to schedule the follow-up appointment.   Any Other Special Instructions Will Be Listed Below (If Applicable).     If you need a refill on your cardiac medications before your next appointment, please call your pharmacy.

## 2016-11-25 NOTE — Progress Notes (Signed)
Cardiology Office Note:    Date:  11/25/2016   ID:  Earl Mueller, DOB 01-17-1936, MRN [id number]  PCP:  Rochel Brome, MD  Cardiologist:  Jenean Lindau, MD   Referring MD: Rochel Brome, MD    ASSESSMENT:    1. Coronary artery disease involving native coronary artery of native heart without angina pectoris   2. Essential hypertension   3. Peripheral vascular disease ([place])   4. Dyslipidemia   5. History of stroke   6. Bilateral carotid bruits    PLAN:    In order of problems listed above:  1. Secondary prevention stressed to the patient. Importance of compliance with diet and medications stressed and he verbalized understanding. 2. His blood pressure stable. Diet was discussed with dyslipidemia and also for hypertension. Importance of regular exercise stressed and he vocalized understanding. 3. Patient has bilateral carotid bruit. He is undergoing carotid endarterectomy in the past we'll do bilateral carotid Doppler. 4. He'll be seen in follow-up appointment in 6 months or earlier if he has any concerns.   Medication Adjustments/Labs and Tests Ordered: Current medicines are reviewed at length with the patient today.  Concerns regarding medicines are outlined above.  No orders of the defined types were placed in this encounter.  No orders of the defined types were placed in this encounter.    History of Present Illness:    Earl Mueller is a 81 y.o. male who is being seen today for the evaluation of coronary artery disease and peripheral vascular disease at the request of Cox, Kirsten, MD. Patient is a pleasant 81 year old male. He has past medical history of coronary artery disease and peripheral vascular disease. He's had carotid endarterectomy on the right side. He denies any problems at this time. I have taken care of this gentleman in my previous practice. He is establishing here today and his seen here to establish his care with my practice at the new location. He  denies any chest pain orthopnea or PND. He leads a sedentary lifestyle. At the time of my evaluation is alert awake oriented and in no distress.  Past Medical History:  Diagnosis Date  . Anxiety   . Cancer ([place])    skin  . Coronary artery disease    Dr. Geraldo Pitter with [facility]  . Hypertension    dr Lennox Solders  . Hypothyroidism   . Peripheral vascular disease ([place])   . Stroke [facility])    rt eye blind    Past Surgical History:  Procedure Laterality Date  . BACK SURGERY    . CARDIAC CATHETERIZATION  03/06/10   NL-LM, 40%pLAD, 30% mid/distal LAD, 98% ostial DIAG (small), 30%pCx, 50%mCx, 30%dCx, 30% OM2, 40%dRCA; Med RX rec ([facility])  . caritid endart    . femerao bypass    . SCALP LACERATION REPAIR N/A 05/23/2013   Procedure: EXCISION SCALP ULCER DEBRIDEMENT OF SKIN AND BONE WITH PLACEMENT OF A-CELL;  Surgeon: Irene Limbo, MD;  Location: [facility];  Service: Plastics;  Laterality: N/A;  . STOMACH SURGERY     x2  . VASCULAR SURGERY     AFBG 10/15/05    Current Medications: Current Meds  Medication Sig  . aspirin EC 81 MG tablet Take 81 mg by mouth daily.  Marland Kitchen atorvastatin (LIPITOR) 20 MG tablet Take 20 mg by mouth 2 (two) times daily.  . cilostazol (PLETAL) 100 MG tablet Take 100 mg by mouth 2 (two) times daily.  . clopidogrel (PLAVIX) 75 MG  tablet Take 75 mg by mouth daily with breakfast.  . ezetimibe (ZETIA) 10 MG tablet Take 10 mg by mouth daily.  . fenofibrate 160 MG tablet Take 160 mg by mouth daily.  . isosorbide dinitrate (ISORDIL) 30 MG tablet Take 30 mg by mouth daily.  Marland Kitchen levothyroxine (SYNTHROID, LEVOTHROID) 88 MCG tablet Take 88 mcg by mouth daily before breakfast.  . Multiple Vitamins-Minerals (CENTRUM SILVER PO) Take 1 tablet by mouth daily.  . nitroGLYCERIN (NITROSTAT) 0.4 MG SL tablet Place 0.4 mg under the tongue every 5 (five) minutes as needed for chest pain.  Marland Kitchen PARoxetine (PAXIL) 20 MG tablet Take 20 mg by mouth daily.      Allergies:   Other   Social History   Social History  . Marital status: Married    Spouse name: N/A  . Number of children: N/A  . Years of education: N/A   Social History Main Topics  . Smoking status: Former Research scientist (life sciences)  . Smokeless tobacco: Never Used  . Alcohol use Yes     Comment: occ  . Drug use: No  . Sexual activity: Not Asked   Other Topics Concern  . None   Social History Narrative  . None     Family History: The patient's family history includes Heart failure in his father.  ROS:   Please see the history of present illness.    All other systems reviewed and are negative.  EKGs/Labs/Other Studies Reviewed:    The following studies were reviewed today: Patient was explained about the results of the previous test. He had broughtblood work with him which I reviewed and explained to his satisfaction. Previous office records were also reviewed.   Recent Labs: No results found for requested labs within last 8760 hours.  Recent Lipid Panel No results found for: CHOL, TRIG, HDL, CHOLHDL, VLDL, LDLCALC, LDLDIRECT  Physical Exam:    VS:  BP 124/68   Pulse 87   Ht 5\' 10"  (1.778 m)   Wt 146 lb 12.8 oz (66.6 kg)   SpO2 96%   BMI 21.06 kg/m     Wt Readings from Last 3 Encounters:  11/25/16 146 lb 12.8 oz (66.6 kg)  05/19/13 154 lb 4.8 oz (70 kg)     GEN: Patient is in no acute distress HEENT: Normal NECK: No JVD; bilateral carotid bruits and left bruit is greater than right. LYMPHATICS: No lymphadenopathy CARDIAC: S1 S2 regular, 2/6 systolic murmur at the apex. RESPIRATORY:  Clear to auscultation without rales, wheezing or rhonchi  ABDOMEN: Soft, non-tender, non-distended MUSCULOSKELETAL:  No edema; No deformity  SKIN: Warm and dry NEUROLOGIC:  Alert and oriented x 3 PSYCHIATRIC:  Normal affect    Signed, Jenean Lindau, MD  11/25/2016 2:43 PM    [place]

## 2016-12-04 DIAGNOSIS — I6523 Occlusion and stenosis of bilateral carotid arteries: Secondary | ICD-10-CM | POA: Diagnosis not present

## 2016-12-04 DIAGNOSIS — I2510 Atherosclerotic heart disease of native coronary artery without angina pectoris: Secondary | ICD-10-CM | POA: Diagnosis not present

## 2016-12-09 ENCOUNTER — Other Ambulatory Visit: Payer: Self-pay

## 2016-12-09 DIAGNOSIS — I2510 Atherosclerotic heart disease of native coronary artery without angina pectoris: Secondary | ICD-10-CM

## 2016-12-10 ENCOUNTER — Other Ambulatory Visit: Payer: Self-pay

## 2016-12-10 DIAGNOSIS — I2510 Atherosclerotic heart disease of native coronary artery without angina pectoris: Secondary | ICD-10-CM

## 2016-12-10 DIAGNOSIS — Z8673 Personal history of transient ischemic attack (TIA), and cerebral infarction without residual deficits: Secondary | ICD-10-CM | POA: Diagnosis not present

## 2016-12-10 DIAGNOSIS — I739 Peripheral vascular disease, unspecified: Secondary | ICD-10-CM | POA: Diagnosis not present

## 2016-12-10 DIAGNOSIS — I10 Essential (primary) hypertension: Secondary | ICD-10-CM | POA: Diagnosis not present

## 2016-12-10 DIAGNOSIS — E785 Hyperlipidemia, unspecified: Secondary | ICD-10-CM | POA: Diagnosis not present

## 2016-12-10 DIAGNOSIS — R0989 Other specified symptoms and signs involving the circulatory and respiratory systems: Secondary | ICD-10-CM | POA: Diagnosis not present

## 2016-12-10 LAB — BASIC METABOLIC PANEL
BUN/Creatinine Ratio: 18 (ref 10–24)
BUN: 23 mg/dL (ref 8–27)
CO2: 27 mmol/L (ref 20–29)
Calcium: 9.5 mg/dL (ref 8.6–10.2)
Chloride: 99 mmol/L (ref 96–106)
Creatinine, Ser: 1.26 mg/dL (ref 0.76–1.27)
GFR, EST AFRICAN AMERICAN: 62 mL/min/{1.73_m2} (ref >59)
GFR, EST NON AFRICAN AMERICAN: 54 mL/min/{1.73_m2} — AB (ref >59)
Glucose: 175 mg/dL — ABNORMAL HIGH (ref 65–99)
POTASSIUM: 3.4 mmol/L — AB (ref 3.5–5.2)
Sodium: 139 mmol/L (ref 134–144)

## 2016-12-10 NOTE — Addendum Note (Signed)
Addended by: Orland Penman on: 12/10/2016 09:29 AM   Modules accepted: Orders

## 2016-12-14 ENCOUNTER — Telehealth: Payer: Self-pay

## 2016-12-14 NOTE — Telephone Encounter (Signed)
Spoke with patient's wife to inform of CT neck appointment time. Scheduled for 12/15/16 at 15:30. Informed patient to arrive 30 minutes early and to not eat for 2 hours prior.  Earl Mueller

## 2016-12-15 DIAGNOSIS — I739 Peripheral vascular disease, unspecified: Secondary | ICD-10-CM | POA: Diagnosis not present

## 2016-12-15 DIAGNOSIS — I2510 Atherosclerotic heart disease of native coronary artery without angina pectoris: Secondary | ICD-10-CM | POA: Diagnosis not present

## 2016-12-15 DIAGNOSIS — I6523 Occlusion and stenosis of bilateral carotid arteries: Secondary | ICD-10-CM | POA: Diagnosis not present

## 2016-12-16 ENCOUNTER — Other Ambulatory Visit: Payer: Self-pay

## 2016-12-16 ENCOUNTER — Telehealth: Payer: Self-pay

## 2016-12-16 DIAGNOSIS — E785 Hyperlipidemia, unspecified: Secondary | ICD-10-CM

## 2016-12-16 NOTE — Telephone Encounter (Signed)
Informed the patient's wife of the CT neck results. Questions regarding the physician preference and lab work were answered. Informed the patient to expect a call from a scheduler regarding his findings.

## 2016-12-18 ENCOUNTER — Other Ambulatory Visit: Payer: Self-pay

## 2016-12-18 DIAGNOSIS — I6523 Occlusion and stenosis of bilateral carotid arteries: Secondary | ICD-10-CM

## 2016-12-18 DIAGNOSIS — E039 Hypothyroidism, unspecified: Secondary | ICD-10-CM | POA: Diagnosis not present

## 2016-12-18 DIAGNOSIS — R7301 Impaired fasting glucose: Secondary | ICD-10-CM | POA: Diagnosis not present

## 2016-12-18 DIAGNOSIS — Z23 Encounter for immunization: Secondary | ICD-10-CM | POA: Diagnosis not present

## 2016-12-18 DIAGNOSIS — I119 Hypertensive heart disease without heart failure: Secondary | ICD-10-CM | POA: Diagnosis not present

## 2017-01-13 ENCOUNTER — Encounter: Payer: Self-pay | Admitting: Vascular Surgery

## 2017-01-13 ENCOUNTER — Encounter: Payer: Self-pay | Admitting: *Deleted

## 2017-01-13 ENCOUNTER — Ambulatory Visit (INDEPENDENT_AMBULATORY_CARE_PROVIDER_SITE_OTHER): Payer: Medicare HMO | Admitting: Vascular Surgery

## 2017-01-13 ENCOUNTER — Other Ambulatory Visit: Payer: Self-pay | Admitting: *Deleted

## 2017-01-13 ENCOUNTER — Ambulatory Visit (HOSPITAL_COMMUNITY)
Admission: RE | Admit: 2017-01-13 | Discharge: 2017-01-13 | Disposition: A | Discharge status: Home / Self Care | Payer: Medicare HMO | Source: Ambulatory Visit | Attending: Vascular Surgery | Admitting: Vascular Surgery

## 2017-01-13 VITALS — BP 123/71 | HR 65 | Temp 97.4°F | Resp 16 | Ht 70.0 in | Wt 145.0 lb

## 2017-01-13 DIAGNOSIS — I6522 Occlusion and stenosis of left carotid artery: Secondary | ICD-10-CM

## 2017-01-13 DIAGNOSIS — I6523 Occlusion and stenosis of bilateral carotid arteries: Secondary | ICD-10-CM | POA: Insufficient documentation

## 2017-01-13 LAB — VAS US CAROTID
LEFT ECA DIAS: 0 cm/s
Left CCA dist dias: 0 cm/s
Left CCA dist sys: 41 cm/s
Left CCA prox dias: 0 cm/s
Left CCA prox sys: 49 cm/s
Left ICA prox dias: 159 cm/s
Left ICA prox sys: 569 cm/s
RCCAPDIAS: 9 cm/s
RIGHT CCA MID DIAS: 10 cm/s
RIGHT ECA DIAS: 0 cm/s
Right CCA prox sys: 62 cm/s
Right cca dist sys: -77 cm/s

## 2017-01-13 NOTE — Progress Notes (Signed)
[facility] [place]  Patient name: Earl Mueller MRN: [id number] DOB: 1935/04/21 Sex: male  REASON FOR CONSULT: Evaluation of internal carotid artery stenosis  HPI: Earl Mueller is a 81 y.o. male, who is here today for evaluation of severe left internal carotid artery stenosis.  He is here today with his wife.  He reports that he had undergone right carotid endarterectomy and [place] approximately 6 years ago.  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  Evaluation of the time revealed high-grade right carotid stenosis and he underwent right carotid endarterectomy.  He has had no stroke symptoms.  He has had no a aphasia or focal weakness.  He is right-handed.  He does have a history of coronary disease.  Also has a history of peripheral vascular occlusive disease with aortobifemoral bypass grafting greater than 10 years ago.  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  No rest pain.  No tissue loss.  Past Medical History:  Diagnosis Date  . Anxiety   . Cancer ([place])    skin  . Coronary artery disease    Dr. Geraldo Pitter with [facility]  . Hypertension    dr Lennox Solders  . Hypothyroidism   . Peripheral vascular disease ([place])   . Stroke [facility])    rt eye blind    Family History  Problem Relation Age of Onset  . Heart failure Father     SOCIAL HISTORY: Social History   Social History  . Marital status: Married    Spouse name: N/A  . Number of children: N/A  . Years of education: N/A   Occupational History  . Not on file.   Social History Main Topics  . Smoking status: Former Smoker    Quit date: 12/18/2007  . Smokeless tobacco: Never Used  . Alcohol use Yes     Comment: occ  . Drug use: No  . Sexual activity: Not on file   Other Topics Concern  . Not on  file   Social History Narrative  . No narrative on file    Allergies  Allergen Reactions  . Other Other (See Comments)    Other reaction(s): Delusions (intolerance) ALL NARCOTICS- Any narcotic makes him a "wild man"    Current Outpatient Prescriptions  Medication Sig Dispense Refill  . aspirin EC 81 MG tablet Take 81 mg by mouth daily.    Marland Kitchen atorvastatin (LIPITOR) 20 MG tablet Take 20 mg by mouth 2 (two) times daily.    . cilostazol (PLETAL) 100 MG tablet Take 100 mg by mouth 2 (two) times daily.    . clopidogrel (PLAVIX) 75 MG tablet Take 75 mg by mouth daily with breakfast.    . ezetimibe (ZETIA) 10 MG tablet Take 10 mg by mouth daily.    . fenofibrate 160 MG tablet Take 160 mg by mouth daily.    . isosorbide dinitrate (ISORDIL) 30 MG tablet Take 30 mg by mouth daily.    Marland Kitchen levothyroxine (SYNTHROID, LEVOTHROID) 88 MCG tablet Take 88 mcg by mouth daily before breakfast.    . Multiple Vitamins-Minerals (CENTRUM SILVER PO) Take 1 tablet by mouth daily.    . nitroGLYCERIN (NITROSTAT) 0.4 MG SL tablet Place 0.4 mg under the tongue every 5 (five) minutes as needed for chest pain.    Marland Kitchen PARoxetine (PAXIL) 20 MG tablet Take 20 mg  by mouth daily.     No current facility-administered medications for this visit.     REVIEW OF SYSTEMS:  [X]  denotes positive finding, [ ]  denotes negative finding Cardiac  Comments:  Chest pain or chest pressure:    Shortness of breath upon exertion:    Short of breath when lying flat:    Irregular heart rhythm:        Vascular    Pain in calf, thigh, or hip brought on by ambulation:    Pain in feet at night that wakes you up from your sleep:     Blood clot in your veins:    Leg swelling:         Pulmonary    Oxygen at home:    Productive cough:     Wheezing:         Neurologic    Sudden weakness in arms or legs:     Sudden numbness in arms or legs:     Sudden onset of difficulty speaking or slurred speech:    Temporary loss of vision in one  eye:  x   Problems with dizziness:         Gastrointestinal    Blood in stool:     Vomited blood:         Genitourinary    Burning when urinating:     Blood in urine:        Psychiatric    Major depression:         Hematologic    Bleeding problems:    Problems with blood clotting too easily:        Skin    Rashes or ulcers:        Constitutional    Fever or chills:      PHYSICAL EXAM: Vitals:   01/13/17 0926 01/13/17 0930  BP: (!) 143/68 123/71  Pulse: 65   Resp: 16   Temp: (!) 97.4 F (36.3 C)   TempSrc: Oral   SpO2: 98%   Weight: 145 lb (65.8 kg)   Height: 5\' 10"  (1.778 m)     GENERAL: The patient is a well-nourished male, in no acute distress. The vital signs are documented above. CARDIOVASCULAR: Well-healed right carotid incision.  No bruits bilaterally.  2+ radial and 2+ femoral pulses.  Absent popliteal and distal pulses bilaterally. PULMONARY: There is good air exchange  ABDOMEN: Soft and non-tender.  Well-healed midline incision.  MUSCULOSKELETAL: There are no major deformities or cyanosis. NEUROLOGIC: No focal weakness or paresthesias are detected. SKIN: There are no ulcers or rashes noted.  Cyanosis both lower extremities related to venous hypertension  pSYCHIATRIC: The patient has a normal affect.  DATA:  Carotid duplex today was reviewed with the patient and his wife present.  This shows widely patent endarterectomy on the right.  He does have critical stenosis in the left internal carotid artery.  The distal internal carotid artery is patent.  MEDICAL ISSUES: I have recommended endarterectomy on the basis of his duplex showing critical disease.  Discussed the procedure would be the same as what he had approximately 6 years ago.  Explained the 1-1-1/2% risk of stroke with surgery also the possibility of cranial nerve injury in the low risk of bleeding and infection with the procedure as well.  Patient understands and is to proceed as soon as possible.   Explained that he would be admitted overnight with the plan for discharge on postop day 1   Rosetta Posner, MD  FACS [facility] Tel [phone] Pager [phone]

## 2017-01-14 ENCOUNTER — Telehealth: Payer: Self-pay

## 2017-01-14 NOTE — Telephone Encounter (Signed)
Left voicemail for patient to call the office regarding his surgical clearance.

## 2017-01-15 ENCOUNTER — Telehealth: Payer: Self-pay

## 2017-01-15 NOTE — Telephone Encounter (Signed)
Returning call to speak with wife regarding cardiac clearance for carotid endartrectomy.

## 2017-01-15 NOTE — Telephone Encounter (Signed)
Left voicemail for the patient to call the office to discuss medical clearance.

## 2017-01-18 ENCOUNTER — Telehealth: Payer: Self-pay

## 2017-01-18 ENCOUNTER — Telehealth: Payer: Self-pay | Admitting: *Deleted

## 2017-01-18 ENCOUNTER — Other Ambulatory Visit: Payer: Self-pay

## 2017-01-18 DIAGNOSIS — Z01818 Encounter for other preprocedural examination: Secondary | ICD-10-CM

## 2017-01-18 HISTORY — DX: Encounter for other preprocedural examination: Z01.818

## 2017-01-18 NOTE — Telephone Encounter (Signed)
Spoke with patient regarding his CEA procedure and the need for lexiscan before cardiac clearance is given. Patient is agreeable to this.

## 2017-01-18 NOTE — Telephone Encounter (Signed)
Call back from Dr. Sumner Boast office patient will need to have lexiscan prior to clearance for surgery. The office is trying to get sched for 11/6. Ok to hold Plavix for 5 days pre-op per doctor however patient has taken today and will only be off 3 days. Will need to resched surgery past Clearance and off Plavix 5 days pre-op. Patient and wife informed and expecting call from cardiologist for [facility]. Verbalized understanding.

## 2017-01-18 NOTE — Telephone Encounter (Signed)
Gave patient instructions for lexiscan.

## 2017-01-18 NOTE — Telephone Encounter (Signed)
Call to office to follow up on need for cardiac clearance and Plavix pre-op hold after no response from basket msg and no response to fax request. Left msg with receptionist . For nurse Caryl Pina to call me back ASAP today.

## 2017-01-19 ENCOUNTER — Ambulatory Visit (HOSPITAL_COMMUNITY): Admission: RE | Admit: 2017-01-19 | Payer: Medicare HMO | Source: Ambulatory Visit

## 2017-01-19 DIAGNOSIS — Z01818 Encounter for other preprocedural examination: Secondary | ICD-10-CM | POA: Diagnosis not present

## 2017-01-19 DIAGNOSIS — I2510 Atherosclerotic heart disease of native coronary artery without angina pectoris: Secondary | ICD-10-CM | POA: Diagnosis not present

## 2017-01-19 DIAGNOSIS — R0989 Other specified symptoms and signs involving the circulatory and respiratory systems: Secondary | ICD-10-CM | POA: Diagnosis not present

## 2017-01-20 ENCOUNTER — Inpatient Hospital Stay (HOSPITAL_COMMUNITY): Admission: RE | Admit: 2017-01-20 | Payer: Medicare HMO | Source: Ambulatory Visit

## 2017-01-20 ENCOUNTER — Telehealth: Payer: Self-pay

## 2017-01-20 ENCOUNTER — Other Ambulatory Visit: Payer: Self-pay

## 2017-01-20 DIAGNOSIS — I2510 Atherosclerotic heart disease of native coronary artery without angina pectoris: Secondary | ICD-10-CM

## 2017-01-20 DIAGNOSIS — Z01818 Encounter for other preprocedural examination: Secondary | ICD-10-CM | POA: Diagnosis not present

## 2017-01-20 DIAGNOSIS — R0989 Other specified symptoms and signs involving the circulatory and respiratory systems: Secondary | ICD-10-CM | POA: Diagnosis not present

## 2017-01-20 NOTE — Telephone Encounter (Signed)
Your physician has requested that you have cardiac CT. Cardiac computed tomography (CT) is a painless test that uses an x-ray machine to take clear, detailed pictures of your heart. For further information please visit [email or web address]. Please follow instruction sheet as given.  Informed patient of his appointment time of 12:00 pm for CTA coronary. Patient was told to arrive at 11:30 to radiololgy. Instruction regarding prep for the test was given. NPO 4 hours before, no caffeine/nicotine 12 hours before, and no sexual enhancement drugs 3 days before. Dr. Geraldo Pitter was made aware of the test date.

## 2017-01-20 NOTE — Telephone Encounter (Signed)
Left voicemail for patient to call the office ASAP regarding need to schedule CTA coronary with FFR.

## 2017-01-20 NOTE — Telephone Encounter (Signed)
Informed patient of his results from the lexi. Requested that patient have further testing completed. Patient was agreeable. Will call patient back with appointment times and instructions.

## 2017-01-21 ENCOUNTER — Telehealth: Payer: Self-pay

## 2017-01-21 NOTE — Telephone Encounter (Signed)
Spoke with patient's wife (okay per patient) to clarify the change of plan. Earl Mueller was unsure of how the patient's plan of care had changed so drastically. Also educated the wife regarding instruction for tomorrow's coronary CTA.

## 2017-01-22 ENCOUNTER — Ambulatory Visit (HOSPITAL_COMMUNITY)
Admission: RE | Admit: 2017-01-22 | Discharge: 2017-01-22 | Disposition: A | Discharge status: Home / Self Care | Payer: Medicare HMO | Source: Ambulatory Visit | Attending: Cardiology | Admitting: Cardiology

## 2017-01-22 ENCOUNTER — Encounter (HOSPITAL_COMMUNITY): Admission: RE | Payer: Self-pay | Source: Ambulatory Visit

## 2017-01-22 ENCOUNTER — Inpatient Hospital Stay (HOSPITAL_COMMUNITY): Admission: RE | Admit: 2017-01-22 | Payer: Medicare HMO | Source: Ambulatory Visit | Admitting: Vascular Surgery

## 2017-01-22 ENCOUNTER — Encounter (HOSPITAL_COMMUNITY): Payer: Self-pay

## 2017-01-22 DIAGNOSIS — I2510 Atherosclerotic heart disease of native coronary artery without angina pectoris: Secondary | ICD-10-CM

## 2017-01-22 DIAGNOSIS — R079 Chest pain, unspecified: Secondary | ICD-10-CM | POA: Diagnosis not present

## 2017-01-22 DIAGNOSIS — R590 Localized enlarged lymph nodes: Secondary | ICD-10-CM | POA: Insufficient documentation

## 2017-01-22 DIAGNOSIS — I700 Atherosclerosis of aorta: Secondary | ICD-10-CM | POA: Insufficient documentation

## 2017-01-22 SURGERY — ENDARTERECTOMY, CAROTID
Anesthesia: General | Laterality: Left

## 2017-01-22 MED ORDER — METOPROLOL TARTRATE 5 MG/5ML IV SOLN
5.0000 mg | INTRAVENOUS | Status: DC | PRN
  Administered 2017-01-22 (×4): 5 mg via INTRAVENOUS

## 2017-01-22 MED ORDER — METOPROLOL TARTRATE 5 MG/5ML IV SOLN
INTRAVENOUS | Status: AC
  Filled 2017-01-22: qty 20

## 2017-01-22 MED ORDER — NITROGLYCERIN 0.4 MG SL SUBL
SUBLINGUAL_TABLET | SUBLINGUAL | Status: AC
  Filled 2017-01-22: qty 2

## 2017-01-22 MED ORDER — IOPAMIDOL (ISOVUE-370) INJECTION 76%
INTRAVENOUS | Status: AC
  Administered 2017-01-22: 80 mL
  Filled 2017-01-22: qty 100

## 2017-01-22 MED ORDER — NITROGLYCERIN 0.4 MG SL SUBL
0.8000 mg | SUBLINGUAL_TABLET | Freq: Once | SUBLINGUAL | Status: AC
  Administered 2017-01-22: 0.8 mg via SUBLINGUAL

## 2017-01-22 NOTE — Discharge Instructions (Addendum)
CT completed. Tolerated well. D/C home with wife and daughter. Awake and alert. In no distress.

## 2017-01-25 ENCOUNTER — Telehealth: Payer: Self-pay | Admitting: *Deleted

## 2017-01-25 ENCOUNTER — Ambulatory Visit (HOSPITAL_COMMUNITY)
Admission: RE | Admit: 2017-01-25 | Discharge: 2017-01-25 | Disposition: A | Discharge status: Home / Self Care | Payer: Medicare HMO | Source: Ambulatory Visit | Attending: Cardiology | Admitting: Cardiology

## 2017-01-25 DIAGNOSIS — I2510 Atherosclerotic heart disease of native coronary artery without angina pectoris: Secondary | ICD-10-CM | POA: Insufficient documentation

## 2017-01-25 DIAGNOSIS — R079 Chest pain, unspecified: Secondary | ICD-10-CM | POA: Diagnosis not present

## 2017-01-25 NOTE — Telephone Encounter (Signed)
-----   Message from Mattie Marlin, RN sent at 01/22/2017  4:37 PM EST ----- Regarding: RE: cardiac clearance and Plavix We are currently awaiting results regarding his coronary CT that was completed today. He had an abnormal lexiscan; therefor the CT was ordered. We will contact you Monday. Thanks for the patience.  ----- Message ----- From: Willy Eddy, RN Sent: 01/22/2017   4:26 PM To: Jenean Lindau, MD, Mattie Marlin, RN Subject: cardiac clearance and Plavix                   When available please forward me a note for Cardiac clearance and approval to hold Plavix for this patient. I know he is pending further testing with you. I will then be able to schedule his CEA.  Much Appreciated. Deborha Payment,

## 2017-01-26 ENCOUNTER — Telehealth: Payer: Self-pay

## 2017-01-26 NOTE — Telephone Encounter (Signed)
Earl Mueller wife was contacted regarding Earl Mueller coronary CTA results. Dr. Geraldo Pitter requested that the patient be seen asap due to these results. Earl Mueller stated that this was an extremely difficult time for them as they are taking their daughter off of life support. Dr. Geraldo Pitter spoke with the wife personally to express his concern regarding the results. Earl Mueller requested that Earl Mueller be able to come in a few days as it would be best for their current situation. The wife is agreeable to bring the patient back and understands the urgency of the situation.

## 2017-01-27 ENCOUNTER — Encounter: Payer: Medicare PPO | Admitting: Vascular Surgery

## 2017-01-27 ENCOUNTER — Encounter (HOSPITAL_COMMUNITY): Payer: Medicare PPO

## 2017-01-29 ENCOUNTER — Ambulatory Visit (INDEPENDENT_AMBULATORY_CARE_PROVIDER_SITE_OTHER): Payer: Medicare HMO | Admitting: Cardiology

## 2017-01-29 ENCOUNTER — Ambulatory Visit (HOSPITAL_BASED_OUTPATIENT_CLINIC_OR_DEPARTMENT_OTHER)
Admission: RE | Admit: 2017-01-29 | Discharge: 2017-01-29 | Disposition: A | Discharge status: Home / Self Care | Payer: Medicare HMO | Source: Ambulatory Visit | Attending: Cardiology | Admitting: Cardiology

## 2017-01-29 ENCOUNTER — Encounter: Payer: Self-pay | Admitting: Cardiology

## 2017-01-29 VITALS — BP 118/58 | HR 70 | Ht 70.0 in | Wt 150.0 lb

## 2017-01-29 DIAGNOSIS — Z8673 Personal history of transient ischemic attack (TIA), and cerebral infarction without residual deficits: Secondary | ICD-10-CM | POA: Diagnosis not present

## 2017-01-29 DIAGNOSIS — I10 Essential (primary) hypertension: Secondary | ICD-10-CM

## 2017-01-29 DIAGNOSIS — R0989 Other specified symptoms and signs involving the circulatory and respiratory systems: Secondary | ICD-10-CM

## 2017-01-29 DIAGNOSIS — I700 Atherosclerosis of aorta: Secondary | ICD-10-CM | POA: Diagnosis not present

## 2017-01-29 DIAGNOSIS — I2510 Atherosclerotic heart disease of native coronary artery without angina pectoris: Secondary | ICD-10-CM

## 2017-01-29 DIAGNOSIS — E785 Hyperlipidemia, unspecified: Secondary | ICD-10-CM | POA: Diagnosis not present

## 2017-01-29 DIAGNOSIS — I739 Peripheral vascular disease, unspecified: Secondary | ICD-10-CM | POA: Diagnosis not present

## 2017-01-29 DIAGNOSIS — Z01818 Encounter for other preprocedural examination: Secondary | ICD-10-CM | POA: Diagnosis not present

## 2017-01-29 DIAGNOSIS — R9439 Abnormal result of other cardiovascular function study: Secondary | ICD-10-CM

## 2017-01-29 NOTE — H&P (View-Only) (Signed)
Cardiology Office Note:    Date:  01/29/2017   ID:  Earl Mueller, DOB 1935/12/08, MRN [id number]  PCP:  Rochel Brome, MD  Cardiologist:  Jenean Lindau, MD   Referring MD: Rochel Brome, MD    ASSESSMENT:    1. Coronary artery disease involving native coronary artery of native heart without angina pectoris   2. Essential hypertension   3. Peripheral vascular disease (HCC)   4. Carotid bruit, unspecified laterality   5. Dyslipidemia   6. History of stroke   7. Abnormal nuclear stress test    PLAN:    In order of problems listed above:  1. I discussed my findings with the vascular surgery service.  The doctor who initially evaluated him was away for a week therefore I spoke with his partner at extensive length and we concurred that he does need a carotid evaluation and he needs intervention on his carotid artery however the cardiovascular evaluation and evaluation of the coronary arteries takes priority. 2. I discussed coronary angiography and left heart catheterization with the patient at extensive length. Procedure, benefits and potential risks were explained. Patient had multiple questions which were answered to the patient's satisfaction. Patient agreed and consented for the procedure. Further recommendations will be made based on the findings of the coronary angiography. In the interim. The patient has any significant symptoms he knows to go to the nearest emergency room. 3. The patient and his family are agreeable and willing to get this done next week on Wednesday in view of the current unfortunate situation.  He knows to also go to the nearest emergency room for any significant symptoms.  Further recommendations will be made based on the findings of the coronary angiography. 4. Secondary prevention stressed to the patient.  Total time for this evaluation was 45 minutes   Medication Adjustments/Labs and Tests Ordered: Current medicines are reviewed at length with the patient  today.  Concerns regarding medicines are outlined above.  No orders of the defined types were placed in this encounter.  No orders of the defined types were placed in this encounter.    Chief Complaint  Patient presents with  . Follow-up     History of Present Illness:    Earl Mueller is a 81 y.o. male .  The patient was to have a carotid endarterectomy and because of his risk assessments he underwent stress testing which was abnormal.  Subsequently the patient has had a CT angiography coronary with FFR and this has been abnormal and it revealed 3 lesions suspected to be obstructive.  Unfortunately the patient lost his daughter yesterday and has been in grieving for the same.  He is seen here with his wife and his daughter.  He denies any chest pain orthopnea or PND.  At the time of my evaluation, the patient is alert awake oriented and in no distress.  Past Medical History:  Diagnosis Date  . Anxiety   . Cancer ([place])    skin  . Coronary artery disease    Dr. Geraldo Pitter with [facility]  . Hypertension    dr Lennox Solders  . Hypothyroidism   . Peripheral vascular disease ([place])   . Stroke [facility])    rt eye blind    Past Surgical History:  Procedure Laterality Date  . BACK SURGERY    . CARDIAC CATHETERIZATION  03/06/10   NL-LM, 40%pLAD, 30% mid/distal LAD, 98% ostial DIAG (small), 30%pCx, 50%mCx, 30%dCx, 30% OM2, 40%dRCA; Med  RX rec (HPR)  . caritid endart    . EXCISION SCALP ULCER DEBRIDEMENT OF SKIN AND BONE WITH PLACEMENT OF A-CELL N/A 05/23/2013   Performed by Irene Limbo, MD at [place] [facility]    . STOMACH SURGERY     x2  . VASCULAR SURGERY     AFBG 10/15/05    Current Medications: Current Meds  Medication Sig  . aspirin EC 81 MG tablet Take 81 mg by mouth daily.  Marland Kitchen atorvastatin (LIPITOR) 80 MG tablet Take 80 mg by mouth daily at 6 PM.   . chlorthalidone (HYGROTON) 25 MG tablet Take 25 mg by mouth daily.   . cilostazol  (PLETAL) 100 MG tablet Take 100 mg by mouth 2 (two) times daily.  . cloNIDine (CATAPRES) 0.1 MG tablet Take 0.1 mg by mouth 2 (two) times daily.  . clopidogrel (PLAVIX) 75 MG tablet Take 75 mg by mouth daily with breakfast.  . fenofibrate 160 MG tablet Take 160 mg by mouth daily.  Marland Kitchen levothyroxine (SYNTHROID, LEVOTHROID) 75 MCG tablet TAKE 1 TABLET BY MOUTH EVERY MORNING 30 MINUTES BEFORE BREAKFAST  . Multiple Vitamins-Minerals (CENTRUM SILVER PO) Take 1 tablet by mouth daily.  . nitroGLYCERIN (NITROSTAT) 0.4 MG SL tablet Place 0.4 mg under the tongue every 5 (five) minutes as needed for chest pain.  Marland Kitchen PARoxetine (PAXIL) 20 MG tablet Take 20 mg by mouth daily.  . potassium chloride (K-DUR,KLOR-CON) 10 MEQ tablet Take 10 mEq by mouth 2 (two) times daily.     Allergies:   Other   Social History   Socioeconomic History  . Marital status: Married    Spouse name: None  . Number of children: None  . Years of education: None  . Highest education level: None  Social Needs  . Financial resource strain: None  . Food insecurity - worry: None  . Food insecurity - inability: None  . Transportation needs - medical: None  . Transportation needs - non-medical: None  Occupational History  . None  Tobacco Use  . Smoking status: Former Smoker    Last attempt to quit: 12/18/2007    Years since quitting: 9.1  . Smokeless tobacco: Never Used  Substance and Sexual Activity  . Alcohol use: Yes    Comment: occ  . Drug use: No  . Sexual activity: None  Other Topics Concern  . None  Social History Narrative  . None     Family History: The patient's family history includes Heart failure in his father.  ROS:   Please see the history of present illness.    All other systems reviewed and are negative.  EKGs/Labs/Other Studies Reviewed:    The following studies were reviewed today: I reviewed findings of the stress test and the CT angiography with the patient and the family had extensive length  and questions were answered to their satisfaction.   Recent Labs: 12/10/2016: BUN 23; Creatinine, Ser 1.26; Potassium 3.4; Sodium 139  Recent Lipid Panel No results found for: CHOL, TRIG, HDL, CHOLHDL, VLDL, LDLCALC, LDLDIRECT  Physical Exam:    VS:  BP (!) 118/58   Pulse 70   Ht 5\' 10"  (1.778 m)   Wt 150 lb (68 kg)   SpO2 98%   BMI 21.52 kg/m     Wt Readings from Last 3 Encounters:  01/29/17 150 lb (68 kg)  01/13/17 145 lb (65.8 kg)  11/25/16 146 lb 12.8 oz (66.6 kg)     GEN: Patient is in no  acute distress HEENT: Normal NECK: No JVD; No carotid bruits LYMPHATICS: No lymphadenopathy CARDIAC: Hear sounds regular, 2/6 systolic murmur at the apex. RESPIRATORY:  Clear to auscultation without rales, wheezing or rhonchi  ABDOMEN: Soft, non-tender, non-distended MUSCULOSKELETAL:  No edema; No deformity  SKIN: Warm and dry NEUROLOGIC:  Alert and oriented x 3 PSYCHIATRIC:  Normal affect   Signed, Jenean Lindau, MD  01/29/2017 10:43 AM    [place]

## 2017-01-29 NOTE — Progress Notes (Signed)
Cardiology Office Note:    Date:  01/29/2017   ID:  Earl Mueller, DOB 1935-04-02, MRN [id number]  PCP:  Rochel Brome, MD  Cardiologist:  Jenean Lindau, MD   Referring MD: Rochel Brome, MD    ASSESSMENT:    1. Coronary artery disease involving native coronary artery of native heart without angina pectoris   2. Essential hypertension   3. Peripheral vascular disease (HCC)   4. Carotid bruit, unspecified laterality   5. Dyslipidemia   6. History of stroke   7. Abnormal nuclear stress test    PLAN:    In order of problems listed above:  1. I discussed my findings with the vascular surgery service.  The doctor who initially evaluated him was away for a week therefore I spoke with his partner at extensive length and we concurred that he does need a carotid evaluation and he needs intervention on his carotid artery however the cardiovascular evaluation and evaluation of the coronary arteries takes priority. 2. I discussed coronary angiography and left heart catheterization with the patient at extensive length. Procedure, benefits and potential risks were explained. Patient had multiple questions which were answered to the patient's satisfaction. Patient agreed and consented for the procedure. Further recommendations will be made based on the findings of the coronary angiography. In the interim. The patient has any significant symptoms he knows to go to the nearest emergency room. 3. The patient and his family are agreeable and willing to get this done next week on Wednesday in view of the current unfortunate situation.  He knows to also go to the nearest emergency room for any significant symptoms.  Further recommendations will be made based on the findings of the coronary angiography. 4. Secondary prevention stressed to the patient.  Total time for this evaluation was 45 minutes   Medication Adjustments/Labs and Tests Ordered: Current medicines are reviewed at length with the patient  today.  Concerns regarding medicines are outlined above.  No orders of the defined types were placed in this encounter.  No orders of the defined types were placed in this encounter.    Chief Complaint  Patient presents with  . Follow-up     History of Present Illness:    Earl Mueller is a 81 y.o. male .  The patient was to have a carotid endarterectomy and because of his risk assessments he underwent stress testing which was abnormal.  Subsequently the patient has had a CT angiography coronary with FFR and this has been abnormal and it revealed 3 lesions suspected to be obstructive.  Unfortunately the patient lost his daughter yesterday and has been in grieving for the same.  He is seen here with his wife and his daughter.  He denies any chest pain orthopnea or PND.  At the time of my evaluation, the patient is alert awake oriented and in no distress.  Past Medical History:  Diagnosis Date  . Anxiety   . Cancer ([place])    skin  . Coronary artery disease    Dr. Geraldo Pitter with [facility]  . Hypertension    dr Lennox Solders  . Hypothyroidism   . Peripheral vascular disease ([place])   . Stroke [facility])    rt eye blind    Past Surgical History:  Procedure Laterality Date  . BACK SURGERY    . CARDIAC CATHETERIZATION  03/06/10   NL-LM, 40%pLAD, 30% mid/distal LAD, 98% ostial DIAG (small), 30%pCx, 50%mCx, 30%dCx, 30% OM2, 40%dRCA; Med  RX rec ([facility])  . caritid endart    . EXCISION SCALP ULCER DEBRIDEMENT OF SKIN AND BONE WITH PLACEMENT OF A-CELL N/A 05/23/2013   Performed by Irene Limbo, MD at [place] [facility]    . STOMACH SURGERY     x2  . VASCULAR SURGERY     [facility] 10/15/05    Current Medications: Current Meds  Medication Sig  . aspirin EC 81 MG tablet Take 81 mg by mouth daily.  Marland Kitchen atorvastatin (LIPITOR) 80 MG tablet Take 80 mg by mouth daily at 6 PM.   . chlorthalidone (HYGROTON) 25 MG tablet Take 25 mg by mouth daily.   . cilostazol  (PLETAL) 100 MG tablet Take 100 mg by mouth 2 (two) times daily.  . cloNIDine (CATAPRES) 0.1 MG tablet Take 0.1 mg by mouth 2 (two) times daily.  . clopidogrel (PLAVIX) 75 MG tablet Take 75 mg by mouth daily with breakfast.  . fenofibrate 160 MG tablet Take 160 mg by mouth daily.  Marland Kitchen levothyroxine (SYNTHROID, LEVOTHROID) 75 MCG tablet TAKE 1 TABLET BY MOUTH EVERY MORNING 30 MINUTES BEFORE BREAKFAST  . Multiple Vitamins-Minerals (CENTRUM SILVER PO) Take 1 tablet by mouth daily.  . nitroGLYCERIN (NITROSTAT) 0.4 MG SL tablet Place 0.4 mg under the tongue every 5 (five) minutes as needed for chest pain.  Marland Kitchen PARoxetine (PAXIL) 20 MG tablet Take 20 mg by mouth daily.  . potassium chloride (K-DUR,KLOR-CON) 10 MEQ tablet Take 10 mEq by mouth 2 (two) times daily.     Allergies:   Other   Social History   Socioeconomic History  . Marital status: Married    Spouse name: None  . Number of children: None  . Years of education: None  . Highest education level: None  Social Needs  . Financial resource strain: None  . Food insecurity - worry: None  . Food insecurity - inability: None  . Transportation needs - medical: None  . Transportation needs - non-medical: None  Occupational History  . None  Tobacco Use  . Smoking status: Former Smoker    Last attempt to quit: 12/18/2007    Years since quitting: 9.1  . Smokeless tobacco: Never Used  Substance and Sexual Activity  . Alcohol use: Yes    Comment: occ  . Drug use: No  . Sexual activity: None  Other Topics Concern  . None  Social History Narrative  . None     Family History: The patient's family history includes Heart failure in his father.  ROS:   Please see the history of present illness.    All other systems reviewed and are negative.  EKGs/Labs/Other Studies Reviewed:    The following studies were reviewed today: I reviewed findings of the stress test and the CT angiography with the patient and the family had extensive length  and questions were answered to their satisfaction.   Recent Labs: 12/10/2016: BUN 23; Creatinine, Ser 1.26; Potassium 3.4; Sodium 139  Recent Lipid Panel No results found for: CHOL, TRIG, HDL, CHOLHDL, VLDL, LDLCALC, LDLDIRECT  Physical Exam:    VS:  BP (!) 118/58   Pulse 70   Ht 5\' 10"  (1.778 m)   Wt 150 lb (68 kg)   SpO2 98%   BMI 21.52 kg/m     Wt Readings from Last 3 Encounters:  01/29/17 150 lb (68 kg)  01/13/17 145 lb (65.8 kg)  11/25/16 146 lb 12.8 oz (66.6 kg)     GEN: Patient is in no  acute distress HEENT: Normal NECK: No JVD; No carotid bruits LYMPHATICS: No lymphadenopathy CARDIAC: Hear sounds regular, 2/6 systolic murmur at the apex. RESPIRATORY:  Clear to auscultation without rales, wheezing or rhonchi  ABDOMEN: Soft, non-tender, non-distended MUSCULOSKELETAL:  No edema; No deformity  SKIN: Warm and dry NEUROLOGIC:  Alert and oriented x 3 PSYCHIATRIC:  Normal affect   Signed, Jenean Lindau, MD  01/29/2017 10:43 AM    [place]

## 2017-01-29 NOTE — Addendum Note (Signed)
Addended by: Mattie Marlin on: 01/29/2017 11:02 AM   Modules accepted: Orders

## 2017-01-29 NOTE — Patient Instructions (Addendum)
Medication Instructions:  Your physician recommends that you continue on your current medications as directed. Please refer to the Current Medication list given to you today.   Labwork: Your physician recommends that you have the following labs drawn: CBC, BMP, TSH, and PT/INR  Testing/Procedures:  A chest x-ray takes a picture of the organs and structures inside the chest, including the heart, lungs, and blood vessels. This test can show several things, including, whether the heart is enlarges; whether fluid is building up in the lungs; and whether pacemaker / defibrillator leads are still in place.    [facility] [address], [place] [address] Dept: [phone] Loc: [place]  01/29/2017  You are scheduled for a Cardiac Catheterization on Wednesday, November 21 with Dr. Peter Martinique.  1. Please arrive at the [facility] (Main Entrance A) at [facility]: [address] [place], [place] [address] at 6:30 AM (two hours before your procedure to ensure your preparation). Free valet parking service is available.   Special note: Every effort is made to have your procedure done on time. Please understand that emergencies sometimes delay scheduled procedures.  2. Diet: Do not eat or drink anything after midnight prior to your procedure except sips of water to take medications.  3. Labs: Done today  4. Medication instructions in preparation for your procedure:  Be sure to take your plavix, aspirin, clonidine, and isosorbide the morning of  On the morning of your procedure, take your Aspirin and any morning medicines NOT listed above.  You may use sips of water.  5. Plan for one night stay--bring personal belongings. 6. Bring a current list of your medications and current insurance cards. 7. You MUST have a responsible person to drive you home. 8. Someone MUST  be with you the first 24 hours after you arrive home or your discharge will be delayed. 9. Please wear clothes that are easy to get on and off and wear slip-on shoes.  Thank you for allowing [place] to care for you!   -- [facility]   Follow-Up: Your physician recommends that you schedule a follow-up appointment in: To be determined after the heart catheterization   Any Other Special Instructions Will Be Listed Below (If Applicable).     If you need a refill on your cardiac medications before your next appointment, please call your pharmacy.   [place], RN, BSN   Chest X-Ray A chest X-ray is a painless test that uses radiation to create images of the structures inside of your chest. Chest X-rays are used to look for many health conditions, including heart failure, pneumonia, tuberculosis, rib fractures, breathing disorders, and cancer. They may be used to diagnose chest pain, constant coughing, or trouble breathing. Tell a health care provider about:  Any allergies you have.  All medicines you are taking, including vitamins, herbs, eye drops, creams, and over-the-counter medicines.  Any surgeries you have had.  Any medical conditions you have.  Whether you are pregnant or may be pregnant. What are the risks? Getting a chest X-ray is a safe procedure. However, you will be exposed to a small amount of radiation. Being exposed to too much radiation over a lifetime can increase the risk of cancer. This risk is small, but it may occur if you have many X-rays throughout your life. What happens before the procedure?  You may be asked to remove glasses, jewelry, and any other metal objects.  You will be asked to undress from the waist up. You may be given a hospital gown to wear.  You may be asked to wear a protective lead apron to protect parts of your body from radiation. What happens during the procedure?  You will be asked to  stand still as each picture is taken to get the best possible images.  You will be asked to take a deep breath and hold your breath for a few seconds.  The X-ray machine will create a picture of your chest using a tiny burst of radiation. This is painless.  More pictures may be taken from other angles. Typically, one picture will be taken while you face the X-ray camera, and another picture will be taken from the side while you stand. If you cannot stand, you may be asked to lie down. The procedure may vary among health care providers and hospitals. What happens after the procedure?  The X-ray(s) will be reviewed by your health care provider or an X-ray (radiology) specialist.  It is up to you to get your test results. Ask your health care provider, or the department that is doing the test, when your results will be ready.  Your health care provider will tell you if you need more tests or a follow-up exam. Keep all follow-up visits as told by your health care provider. This is important. Summary  A chest X-ray is a safe, painless test that is used to examine the inside of the chest, heart, and lungs.  You will need to undress from the waist up and remove jewelry and metal objects before the procedure.  You will be exposed to a small amount of radiation during the procedure.  The X-ray machine will take one or more pictures of your chest while you remain as still as possible.  Later, a health care provider or specialist will review the test results with you. This information is not intended to replace advice given to you by your health care provider. Make sure you discuss any questions you have with your health care provider. Document Released: 04/28/2016 Document Revised: 04/28/2016 Document Reviewed: 04/28/2016 Elsevier Interactive Patient Education  2018 [employer].  Coronary Angiogram With Stent Coronary angiogram with stent placement is a procedure to widen or open a narrow  blood vessel of the heart (coronary artery). Arteries may become blocked by cholesterol buildup (plaques) in the lining or wall. When a coronary artery becomes partially blocked, blood flow to that area decreases. This may lead to chest pain or a heart attack (myocardial infarction). A stent is a small piece of metal that looks like mesh or a spring. Stent placement may be done as treatment for a heart attack or right after a coronary angiogram in which a blocked artery is found. Let your health care provider know about:  Any allergies you have.  All medicines you are taking, including vitamins, herbs, eye drops, creams, and over-the-counter medicines.  Any problems you or family members have had with anesthetic medicines.  Any blood disorders you have.  Any surgeries you have had.  Any medical conditions you have.  Whether you are pregnant or may be pregnant. What are the risks? Generally, this is a safe procedure. However, problems may occur, including:  Damage to the heart or its blood vessels.  A return of blockage.  Bleeding, infection, or bruising at the insertion site.  A collection of  blood under the skin (hematoma) at the insertion site.  A blood clot in another part of the body.  Kidney injury.  Allergic reaction to the dye or contrast that is used.  Bleeding into the abdomen (retroperitoneal bleeding).  What happens before the procedure? Staying hydrated Follow instructions from your health care provider about hydration, which may include:  Up to 2 hours before the procedure - you may continue to drink clear liquids, such as water, clear fruit juice, black coffee, and plain tea.  Eating and drinking restrictions Follow instructions from your health care provider about eating and drinking, which may include:  8 hours before the procedure - stop eating heavy meals or foods such as meat, fried foods, or fatty foods.  6 hours before the procedure - stop eating  light meals or foods, such as toast or cereal.  2 hours before the procedure - stop drinking clear liquids.  Ask your health care provider about:  Changing or stopping your regular medicines. This is especially important if you are taking diabetes medicines or blood thinners.  Taking medicines such as ibuprofen. These medicines can thin your blood. Do not take these medicines before your procedure if your health care provider instructs you not to. Generally, aspirin is recommended before a procedure of passing a small, thin tube (catheter) through a blood vessel and into the heart (cardiac catheterization).  What happens during the procedure?  An IV tube will be inserted into one of your veins.  You will be given one or more of the following: ? A medicine to help you relax (sedative). ? A medicine to numb the area where the catheter will be inserted into an artery (local anesthetic).  To reduce your risk of infection: ? Your health care team will wash or sanitize their hands. ? Your skin will be washed with soap. ? Hair may be removed from the area where the catheter will be inserted.  Using a guide wire, the catheter will be inserted into an artery. The location may be in your groin, in your wrist, or in the fold of your arm (near your elbow).  A type of X-ray (fluoroscopy) will be used to help guide the catheter to the opening of the arteries in the heart.  A dye will be injected into the catheter, and X-rays will be taken. The dye will help to show where any narrowing or blockages are located in the arteries.  A tiny wire will be guided to the blocked spot, and a balloon will be inflated to make the artery wider.  The stent will be expanded and will crush the plaques into the wall of the vessel. The stent will hold the area open and improve the blood flow. Most stents have a drug coating to reduce the risk of the stent narrowing over time.  The artery may be made wider using a  drill, laser, or other tools to remove plaques.  When the blood flow is better, the catheter will be removed. The lining of the artery will grow over the stent, which stays where it was placed. This procedure may vary among health care providers and hospitals. What happens after the procedure?  If the procedure is done through the leg, you will be kept in bed lying flat for about 6 hours. You will be instructed to not bend and not cross your legs.  The insertion site will be checked frequently.  The pulse in your foot or wrist will be checked frequently.  You may have additional blood tests, X-rays, and a test that records the electrical activity of your heart (electrocardiogram, or ECG). This information is not intended to replace advice given to you by your health care provider. Make sure you discuss any questions you have with your health care provider. Document Released: 09/06/2002 Document Revised: 10/31/2015 Document Reviewed: 10/06/2015 Elsevier Interactive Patient Education  2017 [employer].

## 2017-01-30 LAB — CBC WITH DIFFERENTIAL/PLATELET
BASOS ABS: 0 10*3/uL (ref 0.0–0.2)
Basos: 0 %
EOS (ABSOLUTE): 0.6 10*3/uL — ABNORMAL HIGH (ref 0.0–0.4)
Eos: 7 %
HEMOGLOBIN: 13.8 g/dL (ref 13.0–17.7)
Hematocrit: 40.3 % (ref 37.5–51.0)
Immature Grans (Abs): 0.1 10*3/uL (ref 0.0–0.1)
Immature Granulocytes: 1 %
LYMPHS ABS: 2.1 10*3/uL (ref 0.7–3.1)
LYMPHS: 24 %
MCH: 32.4 pg (ref 26.6–33.0)
MCHC: 34.2 g/dL (ref 31.5–35.7)
MCV: 95 fL (ref 79–97)
MONOCYTES: 10 %
Monocytes Absolute: 0.8 10*3/uL (ref 0.1–0.9)
NEUTROS PCT: 58 %
Neutrophils Absolute: 5.1 10*3/uL (ref 1.4–7.0)
Platelets: 343 10*3/uL (ref 150–379)
RBC: 4.26 x10E6/uL (ref 4.14–5.80)
RDW: 14.2 % (ref 12.3–15.4)
WBC: 8.8 10*3/uL (ref 3.4–10.8)

## 2017-01-30 LAB — BASIC METABOLIC PANEL
BUN / CREAT RATIO: 15 (ref 10–24)
BUN: 19 mg/dL (ref 8–27)
CO2: 27 mmol/L (ref 20–29)
CREATININE: 1.28 mg/dL — AB (ref 0.76–1.27)
Calcium: 10.3 mg/dL — ABNORMAL HIGH (ref 8.6–10.2)
Chloride: 97 mmol/L (ref 96–106)
GFR, EST AFRICAN AMERICAN: 61 mL/min/{1.73_m2} (ref >59)
GFR, EST NON AFRICAN AMERICAN: 53 mL/min/{1.73_m2} — AB (ref >59)
GLUCOSE: 82 mg/dL (ref 65–99)
Potassium: 4.2 mmol/L (ref 3.5–5.2)
SODIUM: 138 mmol/L (ref 134–144)

## 2017-01-30 LAB — PROTIME-INR
INR: 1.1 (ref 0.8–1.2)
PROTHROMBIN TIME: 11.1 s (ref 9.1–12.0)

## 2017-01-30 LAB — TSH: TSH: 3.15 u[IU]/mL (ref 0.450–4.500)

## 2017-02-02 ENCOUNTER — Telehealth: Payer: Self-pay

## 2017-02-02 NOTE — Telephone Encounter (Signed)
Patient contacted pre-catheterization at [facility] scheduled for:  02/03/2017 @ 0830 Verified arrival time and place:  NT @ 0630 Confirmed AM meds to be taken pre-cath with sip of water: Take ASA/Plavix Confirmed patient has responsible person to drive home post procedure and observe patient for 24 hours:  yes Addl concerns:  none

## 2017-02-03 ENCOUNTER — Encounter (HOSPITAL_COMMUNITY)
Admission: RE | Disposition: A | Discharge status: Home / Self Care | Payer: Self-pay | Source: Ambulatory Visit | Attending: Cardiology

## 2017-02-03 ENCOUNTER — Encounter (HOSPITAL_COMMUNITY): Payer: Self-pay | Admitting: Cardiology

## 2017-02-03 ENCOUNTER — Ambulatory Visit (HOSPITAL_COMMUNITY)
Admission: RE | Admit: 2017-02-03 | Discharge: 2017-02-03 | Disposition: A | Discharge status: Home / Self Care | Payer: Medicare HMO | Source: Ambulatory Visit | Attending: Cardiology | Admitting: Cardiology

## 2017-02-03 DIAGNOSIS — E785 Hyperlipidemia, unspecified: Secondary | ICD-10-CM

## 2017-02-03 DIAGNOSIS — I739 Peripheral vascular disease, unspecified: Secondary | ICD-10-CM

## 2017-02-03 DIAGNOSIS — I6522 Occlusion and stenosis of left carotid artery: Secondary | ICD-10-CM | POA: Diagnosis not present

## 2017-02-03 DIAGNOSIS — I25119 Atherosclerotic heart disease of native coronary artery with unspecified angina pectoris: Secondary | ICD-10-CM | POA: Diagnosis present

## 2017-02-03 DIAGNOSIS — Z7902 Long term (current) use of antithrombotics/antiplatelets: Secondary | ICD-10-CM | POA: Insufficient documentation

## 2017-02-03 DIAGNOSIS — F419 Anxiety disorder, unspecified: Secondary | ICD-10-CM | POA: Diagnosis not present

## 2017-02-03 DIAGNOSIS — Z7982 Long term (current) use of aspirin: Secondary | ICD-10-CM | POA: Diagnosis not present

## 2017-02-03 DIAGNOSIS — I2582 Chronic total occlusion of coronary artery: Secondary | ICD-10-CM | POA: Diagnosis not present

## 2017-02-03 DIAGNOSIS — I2510 Atherosclerotic heart disease of native coronary artery without angina pectoris: Secondary | ICD-10-CM | POA: Diagnosis not present

## 2017-02-03 DIAGNOSIS — F17210 Nicotine dependence, cigarettes, uncomplicated: Secondary | ICD-10-CM | POA: Insufficient documentation

## 2017-02-03 DIAGNOSIS — Z8673 Personal history of transient ischemic attack (TIA), and cerebral infarction without residual deficits: Secondary | ICD-10-CM | POA: Diagnosis not present

## 2017-02-03 DIAGNOSIS — R0989 Other specified symptoms and signs involving the circulatory and respiratory systems: Secondary | ICD-10-CM

## 2017-02-03 DIAGNOSIS — Z79899 Other long term (current) drug therapy: Secondary | ICD-10-CM | POA: Insufficient documentation

## 2017-02-03 DIAGNOSIS — I10 Essential (primary) hypertension: Secondary | ICD-10-CM

## 2017-02-03 DIAGNOSIS — R9439 Abnormal result of other cardiovascular function study: Secondary | ICD-10-CM

## 2017-02-03 DIAGNOSIS — E039 Hypothyroidism, unspecified: Secondary | ICD-10-CM | POA: Diagnosis not present

## 2017-02-03 HISTORY — PX: LEFT HEART CATH AND CORONARY ANGIOGRAPHY: CATH118249

## 2017-02-03 SURGERY — LEFT HEART CATH AND CORONARY ANGIOGRAPHY
Anesthesia: LOCAL

## 2017-02-03 MED ORDER — SODIUM CHLORIDE 0.9 % WEIGHT BASED INFUSION: 1.0000 mL/kg/h | INTRAVENOUS | Status: AC

## 2017-02-03 MED ORDER — HEPARIN SODIUM (PORCINE) 1000 UNIT/ML IJ SOLN
INTRAMUSCULAR | Status: DC | PRN
  Administered 2017-02-03: 3500 [IU] via INTRAVENOUS

## 2017-02-03 MED ORDER — SODIUM CHLORIDE 0.9 % WEIGHT BASED INFUSION: 1.0000 mL/kg/h | INTRAVENOUS | Status: DC

## 2017-02-03 MED ORDER — HEPARIN (PORCINE) IN NACL 2-0.9 UNIT/ML-% IJ SOLN
INTRAMUSCULAR | Status: AC
  Filled 2017-02-03: qty 500

## 2017-02-03 MED ORDER — HEPARIN (PORCINE) IN NACL 2-0.9 UNIT/ML-% IJ SOLN
INTRAMUSCULAR | Status: AC | PRN
  Administered 2017-02-03: 1000 mL

## 2017-02-03 MED ORDER — SODIUM CHLORIDE 0.9% FLUSH: 3.0000 mL | INTRAVENOUS | Status: DC | PRN

## 2017-02-03 MED ORDER — VERAPAMIL HCL 2.5 MG/ML IV SOLN
INTRAVENOUS | Status: AC
  Filled 2017-02-03: qty 2

## 2017-02-03 MED ORDER — IOPAMIDOL (ISOVUE-370) INJECTION 76%
INTRAVENOUS | Status: DC | PRN
  Administered 2017-02-03: 85 mL via INTRA_ARTERIAL

## 2017-02-03 MED ORDER — IOPAMIDOL (ISOVUE-370) INJECTION 76%
INTRAVENOUS | Status: AC
  Filled 2017-02-03: qty 100

## 2017-02-03 MED ORDER — HEPARIN (PORCINE) IN NACL 2-0.9 UNIT/ML-% IJ SOLN
INTRAMUSCULAR | Status: AC
  Filled 2017-02-03: qty 1000

## 2017-02-03 MED ORDER — HEPARIN SODIUM (PORCINE) 1000 UNIT/ML IJ SOLN
INTRAMUSCULAR | Status: AC
  Filled 2017-02-03: qty 1

## 2017-02-03 MED ORDER — SODIUM CHLORIDE 0.9 % IV SOLN: 250.0000 mL | INTRAVENOUS | Status: DC | PRN

## 2017-02-03 MED ORDER — LIDOCAINE HCL (PF) 1 % IJ SOLN
INTRAMUSCULAR | Status: DC | PRN
  Administered 2017-02-03: 2 mL via SUBCUTANEOUS

## 2017-02-03 MED ORDER — LIDOCAINE HCL (PF) 1 % IJ SOLN
INTRAMUSCULAR | Status: AC
  Filled 2017-02-03: qty 30

## 2017-02-03 MED ORDER — SODIUM CHLORIDE 0.9 % WEIGHT BASED INFUSION
3.0000 mL/kg/h | INTRAVENOUS | Status: AC
  Administered 2017-02-03: 3 mL/kg/h via INTRAVENOUS

## 2017-02-03 MED ORDER — SODIUM CHLORIDE 0.9% FLUSH: 3.0000 mL | Freq: Two times a day (BID) | INTRAVENOUS | Status: DC

## 2017-02-03 MED ORDER — HEPARIN (PORCINE) IN NACL 2-0.9 UNIT/ML-% IJ SOLN
INTRAMUSCULAR | Status: DC | PRN
  Administered 2017-02-03: 10 mL via INTRA_ARTERIAL

## 2017-02-03 SURGICAL SUPPLY — 10 items

## 2017-02-03 NOTE — Progress Notes (Signed)
Spoke with Domenic Moras, RN at Dr. Luther Parody office. Informed the nurse that the cath report would be routed to both of them. Requested that the nurse and the doctor review the report and make contact with [place] after.

## 2017-02-03 NOTE — Discharge Instructions (Signed)

## 2017-02-03 NOTE — Interval H&P Note (Signed)
History and Physical Interval Note:  02/03/2017 8:39 AM  Earl Mueller  has presented today for surgery, with the diagnosis of abnormal nuc - cad  The various methods of treatment have been discussed with the patient and family. After consideration of risks, benefits and other options for treatment, the patient has consented to  Procedure(s): LEFT HEART CATH AND CORONARY ANGIOGRAPHY (N/A) as a surgical intervention .  The patient's history has been reviewed, patient examined, no change in status, stable for surgery.  I have reviewed the patient's chart and labs.  Questions were answered to the patient's satisfaction.   Cath Lab Visit (complete for each Cath Lab visit)  Clinical Evaluation Leading to the Procedure:   ACS: No.  Non-ACS:    Anginal Classification: CCS I  Anti-ischemic medical therapy: Minimal Therapy (1 class of medications)  Non-Invasive Test Results: High-risk stress test findings: cardiac mortality >3%/year  Prior CABG: No previous CABG        Earl Mueller [facility] 02/03/2017 8:39 AM

## 2017-02-09 ENCOUNTER — Encounter (HOSPITAL_COMMUNITY): Payer: Medicare PPO

## 2017-02-09 ENCOUNTER — Encounter: Payer: Medicare PPO | Admitting: Vascular Surgery

## 2017-02-10 ENCOUNTER — Telehealth: Payer: Self-pay | Admitting: *Deleted

## 2017-02-10 ENCOUNTER — Other Ambulatory Visit: Payer: Self-pay | Admitting: *Deleted

## 2017-02-10 NOTE — Telephone Encounter (Signed)
Call to patient's wife. To be at [facility] admitting on 03/01/17 at 7:30 am for surgery. Hold Plavix and Pletal x 5 days pr-op and stay on Aspirin per Dr. Julianne Rice. No food or drink past MN 02/28/17. Follow the detailed instructions they receive from the [place] [facility] about this surgery. Expect to stay at least 1 night. Verbalized understanding.

## 2017-02-11 DIAGNOSIS — E038 Other specified hypothyroidism: Secondary | ICD-10-CM | POA: Diagnosis not present

## 2017-02-23 DIAGNOSIS — C4441 Basal cell carcinoma of skin of scalp and neck: Secondary | ICD-10-CM | POA: Diagnosis not present

## 2017-02-23 DIAGNOSIS — D2239 Melanocytic nevi of other parts of face: Secondary | ICD-10-CM | POA: Diagnosis not present

## 2017-02-24 ENCOUNTER — Telehealth: Payer: Self-pay | Admitting: *Deleted

## 2017-02-24 NOTE — Pre-Procedure Instructions (Signed)
Earl Mueller  02/24/2017      [place], [place], [place] [address] Phone: [phone] Fax: [phone]    Your procedure is scheduled on Monday, December 17th   Report to [facility] Admitting at 7:30Am             (posted surgery time 9:30a - 11:43p)   Call this number if you have problems the morning of surgery:  [phone].   Remember:              4-5 days prior to surgery, STOP TAKING any Vitamins, Herbal Supplements, Anti-inflammatories.              You have instructed by to stop your Plavix and Pletal x 5 days  (last dose would be 12/12)              And to continue your aspirin per Dr. Julianne Rice.   Do not eat food or drink liquids after midnight, Sunday.   Take these medicines the morning of surgery with A SIP OF WATER : Clonidine, Levothyroxine, Paxil   Do not wear jewelry - no rings or watches.  Do not wear lotions, colognes or deodorant.             Men may shave face and neck.   Do not bring valuables to the hospital.  [facility] is not responsible for any belongings or valuables.  Contacts, dentures or bridgework may not be worn into surgery.  Leave your suitcase in the car.  After surgery it may be brought to your room. For patients admitted to the hospital, discharge time will be determined by your treatment team.  Please read over the following fact sheets that you were given. Pain Booklet, MRSA Information and Surgical Site Infection Prevention      [facility]- Preparing For Surgery  Before surgery, you can play an important role. Because skin is not sterile, your skin needs to be as free of germs as possible. You can reduce the number of germs on your skin by washing with CHG (chlorahexidine gluconate) Soap before surgery.  CHG is an antiseptic cleaner which kills germs and bonds with the skin to continue killing germs even after washing.  Please do not use if you  have an allergy to CHG or antibacterial soaps. If your skin becomes reddened/irritated stop using the CHG.  Do not shave (including legs and underarms) for at least 48 hours prior to first CHG shower. It is OK to shave your face.  Please follow these instructions carefully.   1. Shower the NIGHT BEFORE SURGERY and the MORNING OF SURGERY with CHG.   2. If you chose to wash your hair, wash your hair first as usual with your normal shampoo.  3. After you shampoo, rinse your hair and body thoroughly to remove the shampoo.  4. Use CHG as you would any other liquid soap. You can apply CHG directly to the skin and wash gently with a scrungie or a clean washcloth.   5. Apply the CHG Soap to your body ONLY FROM THE NECK DOWN.  Do not use on open wounds or open sores. Avoid contact with your eyes, ears, mouth and genitals (private parts). Wash Face and genitals (private parts)  with your normal soap.  6. Wash thoroughly, paying special attention to the area where your surgery will be performed.  7. Thoroughly rinse your body with warm water from the neck down.  8. DO NOT shower/wash with your normal soap after using and rinsing off the CHG Soap.  9. Pat yourself dry with a CLEAN TOWEL.  10. Wear CLEAN PAJAMAS to bed the night before surgery, wear comfortable clothes the morning of surgery  11. Place CLEAN SHEETS on your bed the night of your first shower and DO NOT SLEEP WITH PETS.    Day of Surgery: Do not apply any deodorants/lotions. Please wear clean clothes to the hospital/surgery center.

## 2017-02-24 NOTE — Telephone Encounter (Signed)
Call from patient's wife . Patient nicked a mole on left side of neck, bleeding and went to [facility] to be seen where they biopsied the site to R/o basil cell carcinoma. Pictures shown to Dr. Donnetta Hutching and confirmed he will need to wait till biopsy site healed before CEA (left) surgery. Will reschedule after 03/16/2017

## 2017-02-24 NOTE — Telephone Encounter (Signed)
Phone call to patient and his wife with new surgery date and time. 03/26/2017 to be at admitting department at 7:30 am NPO past MN and hold Plavix and Pletal for 5 days pre-op, stay on aspirin. Follow the detailed instructions from the [facility] - Admission Testing Department about preparing for this surgery. Instructed to follow up with Dermatologist right away  for any sign of infection or non healing of area on left neck. Verbalized understanding.

## 2017-02-24 NOTE — Progress Notes (Signed)
Hold Plavix and Pletal x 5 days pr-op and stay on Aspirin per Dr. Julianne Rice

## 2017-02-25 ENCOUNTER — Inpatient Hospital Stay (HOSPITAL_COMMUNITY)
Admission: RE | Admit: 2017-02-25 | Discharge: 2017-02-25 | Disposition: A | Discharge status: Home / Self Care | Payer: Medicare HMO | Source: Ambulatory Visit

## 2017-03-05 ENCOUNTER — Ambulatory Visit (INDEPENDENT_AMBULATORY_CARE_PROVIDER_SITE_OTHER): Payer: Medicare HMO | Admitting: Cardiology

## 2017-03-05 ENCOUNTER — Encounter: Payer: Self-pay | Admitting: Cardiology

## 2017-03-05 VITALS — BP 120/82 | HR 72 | Ht 70.0 in | Wt 145.0 lb

## 2017-03-05 DIAGNOSIS — R9439 Abnormal result of other cardiovascular function study: Secondary | ICD-10-CM | POA: Diagnosis not present

## 2017-03-05 DIAGNOSIS — I10 Essential (primary) hypertension: Secondary | ICD-10-CM

## 2017-03-05 DIAGNOSIS — R0989 Other specified symptoms and signs involving the circulatory and respiratory systems: Secondary | ICD-10-CM

## 2017-03-05 DIAGNOSIS — I6529 Occlusion and stenosis of unspecified carotid artery: Secondary | ICD-10-CM

## 2017-03-05 DIAGNOSIS — E785 Hyperlipidemia, unspecified: Secondary | ICD-10-CM

## 2017-03-05 DIAGNOSIS — I739 Peripheral vascular disease, unspecified: Secondary | ICD-10-CM | POA: Diagnosis not present

## 2017-03-05 DIAGNOSIS — I2510 Atherosclerotic heart disease of native coronary artery without angina pectoris: Secondary | ICD-10-CM | POA: Diagnosis not present

## 2017-03-05 DIAGNOSIS — I6522 Occlusion and stenosis of left carotid artery: Secondary | ICD-10-CM | POA: Diagnosis not present

## 2017-03-05 DIAGNOSIS — Z01818 Encounter for other preprocedural examination: Secondary | ICD-10-CM

## 2017-03-05 DIAGNOSIS — Z8673 Personal history of transient ischemic attack (TIA), and cerebral infarction without residual deficits: Secondary | ICD-10-CM | POA: Diagnosis not present

## 2017-03-05 HISTORY — DX: Occlusion and stenosis of unspecified carotid artery: I65.29

## 2017-03-05 NOTE — Progress Notes (Signed)
Cardiology Office Note:    Date:  03/05/2017   ID:  Earl Mueller, DOB Jul 01, 1935, MRN [id number]  PCP:  Rochel Brome, MD  Cardiologist:  Jenean Lindau, MD   Referring MD: Rochel Brome, MD    ASSESSMENT:    1. Carotid artery stenosis, asymptomatic, left   2. Coronary artery disease involving native coronary artery of native heart without angina pectoris   3. Essential hypertension   4. Peripheral vascular disease ([place])   5. Abnormal nuclear stress test   6. Carotid bruit, unspecified laterality   7. Dyslipidemia   8. Pre-operative clearance   9. History of stroke    PLAN:    In order of problems listed above:  1. Secondary prevention stressed with the patient.  Importance of compliance with diet and medications stressed and the patient vocalized understanding. 2. His blood pressure stable.  He does not have any anginal symptoms and I advised him to continue medical therapy.  In view of the above he is not at high risk for coronary events during the aforementioned surgery I would just consider his risk is moderate. 3. Meticulous hemodynamic monitoring  will further reduce the risk of coronary events. 4. The patient will be seen in follow-up appointment in 3 months or earlier if he has any concerns.   Medication Adjustments/Labs and Tests Ordered: Current medicines are reviewed at length with the patient today.  Concerns regarding medicines are outlined above.  No orders of the defined types were placed in this encounter.  No orders of the defined types were placed in this encounter.    Chief Complaint  Patient presents with  . Follow-up     History of Present Illness:    Earl Mueller is a 81 y.o. male.  The patient has history of coronary artery disease and carotid stenosis.  He had abnormal stress test and therefore underwent coronary angiography.  He had obstructive disease only in the smaller vessels and medical therapy was recommended.  He was due to undergo  carotid surgery but his surgeon canceled it cancerous skin lesion in the left side of the neck for which the patient is being treated.  The patient denies any problems at this time no chest pain orthopnea or PND.  He is ambulating to the best of his ability without any symptoms.  His wife accompanies him for the visit.  At the time of my evaluation, the patient is alert awake oriented and in no distress.  Past Medical History:  Diagnosis Date  . Anxiety   . Cancer ([place])    skin  . Coronary artery disease    Dr. Geraldo Pitter with [facility]  . Hypertension    dr Lennox Solders  . Hypothyroidism   . Peripheral vascular disease ([place])   . Stroke [facility])    rt eye blind    Past Surgical History:  Procedure Laterality Date  . BACK SURGERY    . CARDIAC CATHETERIZATION  03/06/10   NL-LM, 40%pLAD, 30% mid/distal LAD, 98% ostial DIAG (small), 30%pCx, 50%mCx, 30%dCx, 30% OM2, 40%dRCA; Med RX rec ([facility])  . caritid endart    . femerao bypass    . LEFT HEART CATH AND CORONARY ANGIOGRAPHY N/A 02/03/2017   Procedure: LEFT HEART CATH AND CORONARY ANGIOGRAPHY;  Surgeon: Martinique, Peter M, MD;  Location: [facility];  Service: Cardiovascular;  Laterality: N/A;  . SCALP LACERATION REPAIR N/A 05/23/2013   Procedure: EXCISION SCALP ULCER DEBRIDEMENT OF SKIN AND  BONE WITH PLACEMENT OF A-CELL;  Surgeon: Irene Limbo, MD;  Location: [facility];  Service: Plastics;  Laterality: N/A;  . STOMACH SURGERY     x2  . VASCULAR SURGERY     [facility] 10/15/05    Current Medications: Current Meds  Medication Sig  . aspirin EC 81 MG tablet Take 81 mg by mouth daily.  Marland Kitchen aspirin-acetaminophen-caffeine (EXCEDRIN MIGRAINE) 250-250-65 MG tablet Take 2 tablets by mouth every 6 (six) hours as needed for headache.   Marland Kitchen atorvastatin (LIPITOR) 80 MG tablet Take 80 mg by mouth daily at 6 PM.   . chlorthalidone (HYGROTON) 25 MG tablet Take 25 mg by mouth daily.   . cilostazol (PLETAL) 100 MG tablet  Take 100 mg by mouth 2 (two) times daily.  . cloNIDine (CATAPRES) 0.1 MG tablet Take 0.1 mg by mouth 2 (two) times daily.  . clopidogrel (PLAVIX) 75 MG tablet Take 75 mg by mouth daily with breakfast.  . fenofibrate 160 MG tablet Take 160 mg by mouth daily.  Marland Kitchen levothyroxine (SYNTHROID, LEVOTHROID) 88 MCG tablet Take 88 mcg by mouth once daily before breakfast  . Multiple Vitamins-Minerals (CENTRUM SILVER PO) Take 1 tablet by mouth daily.  . nitroGLYCERIN (NITROSTAT) 0.4 MG SL tablet Place 0.4 mg under the tongue every 5 (five) minutes as needed for chest pain.  Marland Kitchen PARoxetine (PAXIL) 20 MG tablet Take 20 mg by mouth daily.  . potassium chloride (K-DUR,KLOR-CON) 10 MEQ tablet Take 10 mEq by mouth 2 (two) times daily.     Allergies:   Other   Social History   Socioeconomic History  . Marital status: Married    Spouse name: None  . Number of children: None  . Years of education: None  . Highest education level: None  Social Needs  . Financial resource strain: None  . Food insecurity - worry: None  . Food insecurity - inability: None  . Transportation needs - medical: None  . Transportation needs - non-medical: None  Occupational History  . None  Tobacco Use  . Smoking status: Former Smoker    Last attempt to quit: 12/18/2007    Years since quitting: 9.2  . Smokeless tobacco: Never Used  Substance and Sexual Activity  . Alcohol use: Yes    Comment: occ  . Drug use: No  . Sexual activity: None  Other Topics Concern  . None  Social History Narrative  . None     Family History: The patient's family history includes Heart failure in his father.  ROS:   Please see the history of present illness.    All other systems reviewed and are negative.  EKGs/Labs/Other Studies Reviewed:    The following studies were reviewed today: I reviewed records and discussed them with the patient at extensive length.  Questions were answered to their satisfaction.   Recent Labs: 01/29/2017:  BUN 19; Creatinine, Ser 1.28; Hemoglobin 13.8; Platelets 343; Potassium 4.2; Sodium 138; TSH 3.150  Recent Lipid Panel No results found for: CHOL, TRIG, HDL, CHOLHDL, VLDL, LDLCALC, LDLDIRECT  Physical Exam:    VS:  BP 120/82 (BP Location: Left Arm, Patient Position: Sitting, Cuff Size: Normal)   Pulse 72   Ht 5\' 10"  (1.778 m)   Wt 145 lb (65.8 kg)   SpO2 98%   BMI 20.81 kg/m     Wt Readings from Last 3 Encounters:  03/05/17 145 lb (65.8 kg)  02/03/17 150 lb (68 kg)  01/29/17 150 lb (68 kg)     GEN:  Patient is in no acute distress HEENT: Normal NECK: No JVD; No carotid bruits LYMPHATICS: No lymphadenopathy CARDIAC: Hear sounds regular, 2/6 systolic murmur at the apex. RESPIRATORY:  Clear to auscultation without rales, wheezing or rhonchi  ABDOMEN: Soft, non-tender, non-distended MUSCULOSKELETAL:  No edema; No deformity  SKIN: Warm and dry NEUROLOGIC:  Alert and oriented x 3 PSYCHIATRIC:  Normal affect   Signed, Jenean Lindau, MD  03/05/2017 10:56 AM    [place]

## 2017-03-05 NOTE — Patient Instructions (Signed)
Medication Instructions:  Your physician recommends that you continue on your current medications as directed. Please refer to the Current Medication list given to you today.  Labwork: None  Testing/Procedures: None  Follow-Up: Your physician recommends that you schedule a follow-up appointment in: 3 months  Any Other Special Instructions Will Be Listed Below (If Applicable).     If you need a refill on your cardiac medications before your next appointment, please call your pharmacy.   [facility]  Karita Dralle A, RN, BSN  

## 2017-03-15 ENCOUNTER — Other Ambulatory Visit: Payer: Self-pay | Admitting: *Deleted

## 2017-03-19 NOTE — Pre-Procedure Instructions (Signed)
Earl Mueller  03/19/2017      [address], [place], [place] [address] Phone: [phone] Fax: [phone]    Your procedure is scheduled on March 26, 2017.  Report to [facility] Admitting at 730 AM.  Call this number if you have problems the morning of surgery:  [phone]   Remember:  Do not eat food or drink liquids after midnight.  Take these medicines the morning of surgery with A SIP OF WATER levothyroxine (synthroid), paroxetine (paxil)  Stop plavix and pletal as instructed by your surgeon  7 days prior to surgery STOP taking any Aspirin (unless otherwise instructed by your surgeon), Aleve, Naproxen, Ibuprofen, Motrin, Advil, Goody's, BC's, all herbal medications, fish oil, and all vitamins  Continue all other medications as instructed by your physician except follow the above medication instructions before surgery   Do not wear jewelry, make-up or nail polish.  Do not wear lotions, powders, or perfumes, or deodorant.  Men may shave face and neck.  Do not bring valuables to the hospital.  [facility] is not responsible for any belongings or valuables.  Contacts, dentures or bridgework may not be worn into surgery.  Leave your suitcase in the car.  After surgery it may be brought to your room.  For patients admitted to the hospital, discharge time will be determined by your treatment team.  Patients discharged the day of surgery will not be allowed to drive home.   Special instructions:  [place]- Preparing For Surgery  Before surgery, you can play an important role. Because skin is not sterile, your skin needs to be as free of germs as possible. You can reduce the number of germs on your skin by washing with CHG (chlorahexidine gluconate) Soap before surgery.  CHG is an antiseptic cleaner which kills germs and bonds with the skin to continue killing germs even after washing.  Please  do not use if you have an allergy to CHG or antibacterial soaps. If your skin becomes reddened/irritated stop using the CHG.  Do not shave (including legs and underarms) for at least 48 hours prior to first CHG shower. It is OK to shave your face.  Please follow these instructions carefully.   1. Shower the NIGHT BEFORE SURGERY and the MORNING OF SURGERY with CHG.   2. If you chose to wash your hair, wash your hair first as usual with your normal shampoo.  3. After you shampoo, rinse your hair and body thoroughly to remove the shampoo.  4. Use CHG as you would any other liquid soap. You can apply CHG directly to the skin and wash gently with a scrungie or a clean washcloth.   5. Apply the CHG Soap to your body ONLY FROM THE NECK DOWN.  Do not use on open wounds or open sores. Avoid contact with your eyes, ears, mouth and genitals (private parts). Wash Face and genitals (private parts)  with your normal soap.  6. Wash thoroughly, paying special attention to the area where your surgery will be performed.  7. Thoroughly rinse your body with warm water from the neck down.  8. DO NOT shower/wash with your normal soap after using and rinsing off the CHG Soap.  9. Pat yourself dry with a CLEAN TOWEL.  10. Wear CLEAN PAJAMAS to bed the night before surgery, wear comfortable clothes the morning of surgery  11.  Place CLEAN SHEETS on your bed the night of your first shower and DO NOT SLEEP WITH PETS.  Day of Surgery: Do not apply any deodorants/lotions. Please wear clean clothes to the hospital/surgery center.     Please read over the following fact sheets that you were given. Pain Booklet, Coughing and Deep Breathing, MRSA Information and Surgical Site Infection Prevention

## 2017-03-19 NOTE — Progress Notes (Addendum)
PCP: Dr. Rochel Brome  Cardiologist: Jyl Heinz, MD  EKG: 02/03/17  In EPIC  Stress test: 01/20/17 in EPIC  ECHO: pt denies ever  Cardiac Cath: 02/03/17 EPIC  Chest x-ray: 01/29/17 in EPIC  Pt stopped plavix and pletal 03/17/17 per instructions

## 2017-03-22 ENCOUNTER — Encounter (HOSPITAL_COMMUNITY): Payer: Self-pay

## 2017-03-22 ENCOUNTER — Encounter (HOSPITAL_COMMUNITY)
Admission: RE | Admit: 2017-03-22 | Discharge: 2017-03-22 | Disposition: A | Discharge status: Home / Self Care | Payer: Medicare HMO | Source: Ambulatory Visit | Attending: Vascular Surgery | Admitting: Vascular Surgery

## 2017-03-22 ENCOUNTER — Other Ambulatory Visit: Payer: Self-pay

## 2017-03-22 DIAGNOSIS — Z87891 Personal history of nicotine dependence: Secondary | ICD-10-CM | POA: Insufficient documentation

## 2017-03-22 DIAGNOSIS — Z7982 Long term (current) use of aspirin: Secondary | ICD-10-CM | POA: Diagnosis not present

## 2017-03-22 DIAGNOSIS — E039 Hypothyroidism, unspecified: Secondary | ICD-10-CM | POA: Diagnosis not present

## 2017-03-22 DIAGNOSIS — I10 Essential (primary) hypertension: Secondary | ICD-10-CM | POA: Diagnosis not present

## 2017-03-22 DIAGNOSIS — Z01812 Encounter for preprocedural laboratory examination: Secondary | ICD-10-CM | POA: Insufficient documentation

## 2017-03-22 DIAGNOSIS — Z8673 Personal history of transient ischemic attack (TIA), and cerebral infarction without residual deficits: Secondary | ICD-10-CM | POA: Insufficient documentation

## 2017-03-22 DIAGNOSIS — I739 Peripheral vascular disease, unspecified: Secondary | ICD-10-CM | POA: Diagnosis not present

## 2017-03-22 DIAGNOSIS — Z79899 Other long term (current) drug therapy: Secondary | ICD-10-CM | POA: Diagnosis not present

## 2017-03-22 DIAGNOSIS — I2510 Atherosclerotic heart disease of native coronary artery without angina pectoris: Secondary | ICD-10-CM | POA: Insufficient documentation

## 2017-03-22 HISTORY — DX: Unspecified osteoarthritis, unspecified site: M19.90

## 2017-03-22 LAB — URINALYSIS, ROUTINE W REFLEX MICROSCOPIC
Bilirubin Urine: NEGATIVE
GLUCOSE, UA: NEGATIVE mg/dL
HGB URINE DIPSTICK: NEGATIVE
Ketones, ur: NEGATIVE mg/dL
Leukocytes, UA: NEGATIVE
Nitrite: NEGATIVE
PROTEIN: NEGATIVE mg/dL
Specific Gravity, Urine: 1.017 (ref 1.005–1.030)
pH: 5 (ref 5.0–8.0)

## 2017-03-22 LAB — COMPREHENSIVE METABOLIC PANEL
ALK PHOS: 54 U/L (ref 38–126)
ALT: 24 U/L (ref 17–63)
ANION GAP: 8 (ref 5–15)
AST: 38 U/L (ref 15–41)
Albumin: 3.4 g/dL — ABNORMAL LOW (ref 3.5–5.0)
BILIRUBIN TOTAL: 0.7 mg/dL (ref 0.3–1.2)
BUN: 24 mg/dL — ABNORMAL HIGH (ref 6–20)
CALCIUM: 10.1 mg/dL (ref 8.9–10.3)
CO2: 26 mmol/L (ref 22–32)
Chloride: 101 mmol/L (ref 101–111)
Creatinine, Ser: 1.54 mg/dL — ABNORMAL HIGH (ref 0.61–1.24)
GFR, EST AFRICAN AMERICAN: 48 mL/min — AB (ref >60)
GFR, EST NON AFRICAN AMERICAN: 41 mL/min — AB (ref >60)
Glucose, Bld: 104 mg/dL — ABNORMAL HIGH (ref 65–99)
POTASSIUM: 3.3 mmol/L — AB (ref 3.5–5.1)
Sodium: 135 mmol/L (ref 135–145)
TOTAL PROTEIN: 6.4 g/dL — AB (ref 6.5–8.1)

## 2017-03-22 LAB — TYPE AND SCREEN
ABO/RH(D): O POS
Antibody Screen: NEGATIVE

## 2017-03-22 LAB — CBC
HEMATOCRIT: 40 % (ref 39.0–52.0)
HEMOGLOBIN: 13.1 g/dL (ref 13.0–17.0)
MCH: 31.5 pg (ref 26.0–34.0)
MCHC: 32.8 g/dL (ref 30.0–36.0)
MCV: 96.2 fL (ref 78.0–100.0)
Platelets: 259 10*3/uL (ref 150–400)
RBC: 4.16 MIL/uL — ABNORMAL LOW (ref 4.22–5.81)
RDW: 14.9 % (ref 11.5–15.5)
WBC: 8.2 10*3/uL (ref 4.0–10.5)

## 2017-03-22 LAB — PROTIME-INR
INR: 1.08
PROTHROMBIN TIME: 14 s (ref 11.4–15.2)

## 2017-03-22 LAB — APTT: APTT: 27 s (ref 24–36)

## 2017-03-22 LAB — SURGICAL PCR SCREEN
MRSA, PCR: NEGATIVE
Staphylococcus aureus: NEGATIVE

## 2017-03-23 NOTE — Progress Notes (Signed)
Anesthesia Chart Review:  Pt is a 82 year old male scheduled for L CEA on 03/26/2017 with Curt Jews, MD  - PCP is Rochel Brome, MD - Cardiologist is Jyl Heinz, MD  PMH includes:  CAD, HTN, PVD, stroke, hypothyroidism. Former smoker. BMI 21. S/p excision scalp ulcer 05/23/13.   Medications include: ASA 81mg , lipitor, chlorthalidone, pletal, clonidine, plavix, fenofibrate, levothyroxine, potassium. Pletal and plavix on hold since 03/17/17   BP 139/60   Pulse 81   Temp 36.5 C   Resp 20   Ht 5\' 10"  (1.778 m)   Wt 145 lb 11.2 oz (66.1 kg)   SpO2 96%   BMI 20.91 kg/m   Preoperative labs reviewed.    CXR 01/29/17:  - Stable interstitial thickening, likely due to chronic inflammatory type change. No frank edema or consolidation. The heart size is normal. There is aortic atherosclerosis. Surgical clips at the gastroesophageal junction noted. - Aortic Atherosclerosis   EKG 02/03/17:  NSR. Possible LA enlargement Septal infarct, age undetermined  Cardiac cath 02/03/17:   Prox RCA lesion is 25% stenosed.  Mid RCA to Dist RCA lesion is 25% stenosed.  Ost LAD to Prox LAD lesion is 80% stenosed.  Mid LAD lesion is 60% stenosed.  1st Diag lesion is 100% stenosed.  Ost Cx to Prox Cx lesion is 75% stenosed.  1st Mrg lesion is 50% stenosed.  The left ventricular systolic function is normal.  LV end diastolic pressure is normal.  The left ventricular ejection fraction is 55-65% by visual estimate. 1. Left dominant circulation 2. 2 vessel obstructive coronary artery disease    - 80% segmental proximal LAD- severely calcified    - 100% mid first diagonal with left to left collaterals.    - 75% focal proximal LCx - moderately calcified    - 50% mid OM1 3. Good overall LV function with focal mid anterior hypokinesis c/w diagonal occlusion. 4. Normal LVEDP  - Cardiologist Dr. Peter Martinique comments: "Patient has moderate obstructive disease in proximal LAD and LCx. The diagonal is  small and appears chronically occluded. LV function looks good. Although CT FFR indicates impaired flow in LAD and LCx the patient is asymptomatic so I would recommend medical management. If he were to develop symptoms of angina PCI could be considered. With calcification this would require modification of plaque with atherectomy. Based on these findings I feel he is a suitable risk to proceed with carotid endarterectomy."   CT coronary fractional flow reserve 02/02/17:  1. FFR 0.61 in proximal LAD suggesting hemodynamically significant stenosis at that location. 2. FFR < 0.5 in proximal LCx suggesting hemodynamically significant stenosis at that location. FFR 0.66 in OM1 suggesting hemodynamically significant OM1 stenosis.  CT coronary morphology 01/22/17:  1. Coronary artery calcium score of 1294 Agatston units places the patient in the [place] percentile for his age and gender. This suggests high risk for future cardiac events. 2. Extensive calcified plaque in the ostial to mid LAD and the ostial to proximal LCx. Possible hemodynamically significant stenoses in both vessels but cannot be definitive due to significant blooming artifact from the heavy calcification. I will send for CT FFR.  Carotid duplex 01/13/17:  1. Patent R CEA. 1-39% R proximal ICA stenosis. 2. 80-99% L proximal ICA stenosis  If no changes, I anticipate pt can proceed with surgery as scheduled.   Willeen Cass, FNP-BC [facility] Stay [facility]/Anesthesiology Phone: [phone] 03/23/2017 4:11 PM

## 2017-03-26 ENCOUNTER — Inpatient Hospital Stay (HOSPITAL_COMMUNITY): Payer: Medicare HMO | Admitting: Anesthesiology

## 2017-03-26 ENCOUNTER — Encounter (HOSPITAL_COMMUNITY): Payer: Self-pay | Admitting: *Deleted

## 2017-03-26 ENCOUNTER — Ambulatory Visit (HOSPITAL_COMMUNITY)
Admission: RE | Admit: 2017-03-26 | Discharge: 2017-03-27 | Disposition: A | Discharge status: Home / Self Care | Payer: Medicare HMO | Source: Ambulatory Visit | Attending: Vascular Surgery | Admitting: Vascular Surgery

## 2017-03-26 ENCOUNTER — Inpatient Hospital Stay (HOSPITAL_COMMUNITY): Payer: Medicare HMO | Admitting: Emergency Medicine

## 2017-03-26 ENCOUNTER — Encounter (HOSPITAL_COMMUNITY)
Admission: RE | Disposition: A | Discharge status: Home / Self Care | Payer: Self-pay | Source: Ambulatory Visit | Attending: Vascular Surgery

## 2017-03-26 DIAGNOSIS — E785 Hyperlipidemia, unspecified: Secondary | ICD-10-CM | POA: Insufficient documentation

## 2017-03-26 DIAGNOSIS — I2510 Atherosclerotic heart disease of native coronary artery without angina pectoris: Secondary | ICD-10-CM | POA: Insufficient documentation

## 2017-03-26 DIAGNOSIS — I9581 Postprocedural hypotension: Secondary | ICD-10-CM | POA: Diagnosis not present

## 2017-03-26 DIAGNOSIS — Z8673 Personal history of transient ischemic attack (TIA), and cerebral infarction without residual deficits: Secondary | ICD-10-CM | POA: Diagnosis not present

## 2017-03-26 DIAGNOSIS — I10 Essential (primary) hypertension: Secondary | ICD-10-CM | POA: Diagnosis not present

## 2017-03-26 DIAGNOSIS — Z85828 Personal history of other malignant neoplasm of skin: Secondary | ICD-10-CM | POA: Diagnosis not present

## 2017-03-26 DIAGNOSIS — I739 Peripheral vascular disease, unspecified: Secondary | ICD-10-CM | POA: Diagnosis not present

## 2017-03-26 DIAGNOSIS — E039 Hypothyroidism, unspecified: Secondary | ICD-10-CM | POA: Diagnosis not present

## 2017-03-26 DIAGNOSIS — Z7902 Long term (current) use of antithrombotics/antiplatelets: Secondary | ICD-10-CM | POA: Insufficient documentation

## 2017-03-26 DIAGNOSIS — R571 Hypovolemic shock: Secondary | ICD-10-CM

## 2017-03-26 DIAGNOSIS — F419 Anxiety disorder, unspecified: Secondary | ICD-10-CM | POA: Diagnosis not present

## 2017-03-26 DIAGNOSIS — I6529 Occlusion and stenosis of unspecified carotid artery: Secondary | ICD-10-CM | POA: Diagnosis present

## 2017-03-26 DIAGNOSIS — Z7982 Long term (current) use of aspirin: Secondary | ICD-10-CM | POA: Insufficient documentation

## 2017-03-26 DIAGNOSIS — Z8249 Family history of ischemic heart disease and other diseases of the circulatory system: Secondary | ICD-10-CM | POA: Insufficient documentation

## 2017-03-26 DIAGNOSIS — Z87891 Personal history of nicotine dependence: Secondary | ICD-10-CM | POA: Insufficient documentation

## 2017-03-26 DIAGNOSIS — Z79890 Hormone replacement therapy (postmenopausal): Secondary | ICD-10-CM | POA: Diagnosis not present

## 2017-03-26 DIAGNOSIS — Z885 Allergy status to narcotic agent status: Secondary | ICD-10-CM | POA: Insufficient documentation

## 2017-03-26 DIAGNOSIS — I63232 Cerebral infarction due to unspecified occlusion or stenosis of left carotid arteries: Secondary | ICD-10-CM | POA: Diagnosis not present

## 2017-03-26 DIAGNOSIS — I6522 Occlusion and stenosis of left carotid artery: Secondary | ICD-10-CM | POA: Diagnosis not present

## 2017-03-26 DIAGNOSIS — I639 Cerebral infarction, unspecified: Secondary | ICD-10-CM | POA: Diagnosis present

## 2017-03-26 DIAGNOSIS — E876 Hypokalemia: Secondary | ICD-10-CM | POA: Diagnosis not present

## 2017-03-26 DIAGNOSIS — Z79899 Other long term (current) drug therapy: Secondary | ICD-10-CM | POA: Insufficient documentation

## 2017-03-26 DIAGNOSIS — Z9889 Other specified postprocedural states: Secondary | ICD-10-CM | POA: Diagnosis not present

## 2017-03-26 DIAGNOSIS — H5461 Unqualified visual loss, right eye, normal vision left eye: Secondary | ICD-10-CM | POA: Diagnosis not present

## 2017-03-26 HISTORY — PX: PATCH ANGIOPLASTY: SHX6230

## 2017-03-26 HISTORY — DX: Occlusion and stenosis of unspecified carotid artery: I65.29

## 2017-03-26 HISTORY — PX: ENDARTERECTOMY: SHX5162

## 2017-03-26 LAB — BASIC METABOLIC PANEL
ANION GAP: 9 (ref 5–15)
BUN: 22 mg/dL — ABNORMAL HIGH (ref 6–20)
CALCIUM: 8.3 mg/dL — AB (ref 8.9–10.3)
CO2: 23 mmol/L (ref 22–32)
Chloride: 103 mmol/L (ref 101–111)
Creatinine, Ser: 1.11 mg/dL (ref 0.61–1.24)
GFR calc Af Amer: >60 mL/min (ref >60)
GLUCOSE: 166 mg/dL — AB (ref 65–99)
Potassium: 3.1 mmol/L — ABNORMAL LOW (ref 3.5–5.1)
Sodium: 135 mmol/L (ref 135–145)

## 2017-03-26 LAB — GLUCOSE, CAPILLARY: Glucose-Capillary: 175 mg/dL — ABNORMAL HIGH (ref 65–99)

## 2017-03-26 LAB — CBC
HCT: 35 % — ABNORMAL LOW (ref 39.0–52.0)
Hemoglobin: 11.9 g/dL — ABNORMAL LOW (ref 13.0–17.0)
MCH: 32.2 pg (ref 26.0–34.0)
MCHC: 34 g/dL (ref 30.0–36.0)
MCV: 94.6 fL (ref 78.0–100.0)
PLATELETS: 241 10*3/uL (ref 150–400)
RBC: 3.7 MIL/uL — ABNORMAL LOW (ref 4.22–5.81)
RDW: 15 % (ref 11.5–15.5)
WBC: 8 10*3/uL (ref 4.0–10.5)

## 2017-03-26 LAB — MAGNESIUM: Magnesium: 1.7 mg/dL (ref 1.7–2.4)

## 2017-03-26 LAB — PHOSPHORUS: Phosphorus: 3.6 mg/dL (ref 2.5–4.6)

## 2017-03-26 SURGERY — ENDARTERECTOMY, CAROTID
Anesthesia: General | Laterality: Left

## 2017-03-26 MED ORDER — LEVOTHYROXINE SODIUM 88 MCG PO TABS
88.0000 ug | ORAL_TABLET | Freq: Every day | ORAL | Status: DC
  Administered 2017-03-27: 88 ug via ORAL
  Filled 2017-03-26: qty 1

## 2017-03-26 MED ORDER — MIDAZOLAM HCL 2 MG/2ML IJ SOLN
1.0000 mg | Freq: Once | INTRAMUSCULAR | Status: AC
  Administered 2017-03-26: 1 mg via INTRAVENOUS

## 2017-03-26 MED ORDER — OXYCODONE-ACETAMINOPHEN 5-325 MG PO TABS
ORAL_TABLET | ORAL | Status: AC
  Administered 2017-03-26: 1 via ORAL
  Filled 2017-03-26: qty 1

## 2017-03-26 MED ORDER — SUGAMMADEX SODIUM 200 MG/2ML IV SOLN
INTRAVENOUS | Status: DC | PRN
  Administered 2017-03-26: 100 mg via INTRAVENOUS

## 2017-03-26 MED ORDER — LACTATED RINGERS IV SOLN
INTRAVENOUS | Status: DC | PRN
  Administered 2017-03-26: 07:00:00 via INTRAVENOUS

## 2017-03-26 MED ORDER — SODIUM CHLORIDE 0.9 % IV SOLN: INTRAVENOUS | Status: DC

## 2017-03-26 MED ORDER — TRAMADOL HCL 50 MG PO TABS: 50.0000 mg | ORAL_TABLET | Freq: Four times a day (QID) | ORAL | Status: DC | PRN

## 2017-03-26 MED ORDER — OXYCODONE HCL 5 MG/5ML PO SOLN: 5.0000 mg | Freq: Once | ORAL | Status: DC | PRN

## 2017-03-26 MED ORDER — DEXTROSE 5 % IV SOLN
1.5000 g | INTRAVENOUS | Status: AC
  Administered 2017-03-26: 1.5 g via INTRAVENOUS

## 2017-03-26 MED ORDER — ACETAMINOPHEN 325 MG PO TABS: 325.0000 mg | ORAL_TABLET | ORAL | Status: DC | PRN

## 2017-03-26 MED ORDER — LIDOCAINE 5 % EX PTCH
1.0000 | MEDICATED_PATCH | CUTANEOUS | Status: DC
  Administered 2017-03-26: 1 via TRANSDERMAL
  Filled 2017-03-26: qty 1

## 2017-03-26 MED ORDER — OXYCODONE HCL 5 MG PO TABS: 5.0000 mg | ORAL_TABLET | Freq: Once | ORAL | Status: DC | PRN

## 2017-03-26 MED ORDER — FENTANYL CITRATE (PF) 100 MCG/2ML IJ SOLN
25.0000 ug | INTRAMUSCULAR | Status: DC | PRN
  Administered 2017-03-26 (×2): 25 ug via INTRAVENOUS
  Administered 2017-03-26 (×2): 50 ug via INTRAVENOUS

## 2017-03-26 MED ORDER — OXYCODONE-ACETAMINOPHEN 5-325 MG PO TABS
ORAL_TABLET | ORAL | Status: AC
  Administered 2017-03-26: 2 via ORAL
  Filled 2017-03-26: qty 2

## 2017-03-26 MED ORDER — POTASSIUM CHLORIDE CRYS ER 20 MEQ PO TBCR
40.0000 meq | EXTENDED_RELEASE_TABLET | Freq: Once | ORAL | Status: AC
  Administered 2017-03-26: 40 meq via ORAL
  Filled 2017-03-26: qty 2

## 2017-03-26 MED ORDER — GUAIFENESIN-DM 100-10 MG/5ML PO SYRP: 15.0000 mL | ORAL_SOLUTION | ORAL | Status: DC | PRN

## 2017-03-26 MED ORDER — SUGAMMADEX SODIUM 200 MG/2ML IV SOLN
INTRAVENOUS | Status: AC
  Filled 2017-03-26: qty 2

## 2017-03-26 MED ORDER — PHENYLEPHRINE HCL 10 MG/ML IJ SOLN
INTRAVENOUS | Status: DC | PRN
  Administered 2017-03-26: 40 ug/min via INTRAVENOUS

## 2017-03-26 MED ORDER — CEFUROXIME SODIUM 1.5 G IV SOLR
1.5000 g | Freq: Two times a day (BID) | INTRAVENOUS | Status: DC
  Administered 2017-03-26: 1.5 g via INTRAVENOUS
  Filled 2017-03-26 (×2): qty 1.5

## 2017-03-26 MED ORDER — EPHEDRINE SULFATE 50 MG/ML IJ SOLN
INTRAMUSCULAR | Status: DC | PRN
  Administered 2017-03-26: 2.5 mg via INTRAVENOUS

## 2017-03-26 MED ORDER — PROPOFOL 10 MG/ML IV BOLUS
INTRAVENOUS | Status: AC
  Filled 2017-03-26: qty 20

## 2017-03-26 MED ORDER — GLYCOPYRROLATE 0.2 MG/ML IJ SOLN
INTRAMUSCULAR | Status: DC | PRN
  Administered 2017-03-26: 0.2 mg via INTRAVENOUS

## 2017-03-26 MED ORDER — HYDROMORPHONE HCL 1 MG/ML IJ SOLN: 0.2500 mg | INTRAMUSCULAR | Status: DC | PRN

## 2017-03-26 MED ORDER — SODIUM CHLORIDE 0.9 % IV SOLN
500.0000 mL | Freq: Once | INTRAVENOUS | Status: AC | PRN
  Administered 2017-03-26: 500 mL via INTRAVENOUS

## 2017-03-26 MED ORDER — MIDAZOLAM HCL 2 MG/2ML IJ SOLN
INTRAMUSCULAR | Status: AC
  Filled 2017-03-26: qty 2

## 2017-03-26 MED ORDER — FENTANYL CITRATE (PF) 100 MCG/2ML IJ SOLN
INTRAMUSCULAR | Status: AC
  Administered 2017-03-26: 50 ug via INTRAVENOUS
  Filled 2017-03-26: qty 2

## 2017-03-26 MED ORDER — DEXAMETHASONE SODIUM PHOSPHATE 10 MG/ML IJ SOLN
INTRAMUSCULAR | Status: DC | PRN
  Administered 2017-03-26: 5 mg via INTRAVENOUS

## 2017-03-26 MED ORDER — NITROGLYCERIN 0.4 MG SL SUBL: 0.4000 mg | SUBLINGUAL_TABLET | SUBLINGUAL | Status: DC | PRN

## 2017-03-26 MED ORDER — SODIUM CHLORIDE 0.9 % IV BOLUS (SEPSIS)
500.0000 mL | Freq: Once | INTRAVENOUS | Status: AC
  Administered 2017-03-26: 500 mL via INTRAVENOUS

## 2017-03-26 MED ORDER — LIDOCAINE HCL 4 % EX SOLN
CUTANEOUS | Status: DC | PRN
Start: 2017-03-26 — End: 2017-03-26
  Administered 2017-03-26: 2 mL via TOPICAL

## 2017-03-26 MED ORDER — PHENOL 1.4 % MT LIQD: 1.0000 | OROMUCOSAL | Status: DC | PRN

## 2017-03-26 MED ORDER — LIDOCAINE HCL (CARDIAC) 20 MG/ML IV SOLN
INTRAVENOUS | Status: DC | PRN
  Administered 2017-03-26: 40 mg via INTRAVENOUS

## 2017-03-26 MED ORDER — ATORVASTATIN CALCIUM 80 MG PO TABS: 80.0000 mg | ORAL_TABLET | Freq: Every day | ORAL | Status: DC

## 2017-03-26 MED ORDER — PROTAMINE SULFATE 10 MG/ML IV SOLN
INTRAVENOUS | Status: AC
  Filled 2017-03-26: qty 5

## 2017-03-26 MED ORDER — CLOPIDOGREL BISULFATE 75 MG PO TABS
75.0000 mg | ORAL_TABLET | Freq: Every day | ORAL | Status: DC
  Administered 2017-03-27: 75 mg via ORAL
  Filled 2017-03-26: qty 1

## 2017-03-26 MED ORDER — LACTATED RINGERS IV SOLN
INTRAVENOUS | Status: DC | PRN
  Administered 2017-03-26 (×2): via INTRAVENOUS

## 2017-03-26 MED ORDER — ALUM & MAG HYDROXIDE-SIMETH 200-200-20 MG/5ML PO SUSP: 15.0000 mL | ORAL | Status: DC | PRN

## 2017-03-26 MED ORDER — FENTANYL CITRATE (PF) 100 MCG/2ML IJ SOLN
INTRAMUSCULAR | Status: AC
  Filled 2017-03-26: qty 2

## 2017-03-26 MED ORDER — HYDRALAZINE HCL 20 MG/ML IJ SOLN: 5.0000 mg | INTRAMUSCULAR | Status: DC | PRN

## 2017-03-26 MED ORDER — PROTAMINE SULFATE 10 MG/ML IV SOLN
INTRAVENOUS | Status: DC | PRN
  Administered 2017-03-26: 5 mg via INTRAVENOUS
  Administered 2017-03-26: 10 mg via INTRAVENOUS

## 2017-03-26 MED ORDER — ACETAMINOPHEN 325 MG RE SUPP
325.0000 mg | RECTAL | Status: DC | PRN
  Filled 2017-03-26: qty 2

## 2017-03-26 MED ORDER — LIDOCAINE 2% (20 MG/ML) 5 ML SYRINGE
INTRAMUSCULAR | Status: AC
  Filled 2017-03-26: qty 10

## 2017-03-26 MED ORDER — FENTANYL CITRATE (PF) 100 MCG/2ML IJ SOLN
25.0000 ug | INTRAMUSCULAR | Status: DC | PRN
  Administered 2017-03-26 (×2): 50 ug via INTRAVENOUS
  Filled 2017-03-26 (×2): qty 2

## 2017-03-26 MED ORDER — POTASSIUM CHLORIDE CRYS ER 10 MEQ PO TBCR
10.0000 meq | EXTENDED_RELEASE_TABLET | Freq: Two times a day (BID) | ORAL | Status: DC
  Administered 2017-03-27: 10 meq via ORAL
  Filled 2017-03-26: qty 1

## 2017-03-26 MED ORDER — ACETAMINOPHEN 10 MG/ML IV SOLN
1000.0000 mg | Freq: Once | INTRAVENOUS | Status: AC
  Administered 2017-03-26: 1000 mg via INTRAVENOUS

## 2017-03-26 MED ORDER — MORPHINE SULFATE (PF) 4 MG/ML IV SOLN: 4.0000 mg | INTRAVENOUS | Status: DC | PRN

## 2017-03-26 MED ORDER — ACETAMINOPHEN 325 MG PO TABS
650.0000 mg | ORAL_TABLET | ORAL | Status: DC | PRN
  Administered 2017-03-27: 650 mg via ORAL
  Filled 2017-03-26: qty 2

## 2017-03-26 MED ORDER — PANTOPRAZOLE SODIUM 40 MG PO TBEC
40.0000 mg | DELAYED_RELEASE_TABLET | Freq: Every day | ORAL | Status: DC
  Administered 2017-03-27: 40 mg via ORAL
  Filled 2017-03-26: qty 1

## 2017-03-26 MED ORDER — DOCUSATE SODIUM 100 MG PO CAPS
100.0000 mg | ORAL_CAPSULE | Freq: Every day | ORAL | Status: DC
  Administered 2017-03-27: 100 mg via ORAL
  Filled 2017-03-26: qty 1

## 2017-03-26 MED ORDER — DEXAMETHASONE SODIUM PHOSPHATE 10 MG/ML IJ SOLN
INTRAMUSCULAR | Status: AC
  Filled 2017-03-26: qty 1

## 2017-03-26 MED ORDER — SODIUM CHLORIDE 0.9 % IV SOLN
INTRAVENOUS | Status: DC | PRN
  Administered 2017-03-26 (×2): 80 ug via INTRAVENOUS

## 2017-03-26 MED ORDER — CLONIDINE HCL 0.1 MG PO TABS
0.1000 mg | ORAL_TABLET | Freq: Two times a day (BID) | ORAL | Status: DC
  Administered 2017-03-27: 0.1 mg via ORAL
  Filled 2017-03-26: qty 1

## 2017-03-26 MED ORDER — ACETAMINOPHEN 10 MG/ML IV SOLN: 1000.0000 mg | Freq: Once | INTRAVENOUS | Status: DC

## 2017-03-26 MED ORDER — PROPOFOL 500 MG/50ML IV EMUL
INTRAVENOUS | Status: DC | PRN
  Administered 2017-03-26: 25 ug/kg/min via INTRAVENOUS

## 2017-03-26 MED ORDER — METOPROLOL TARTRATE 5 MG/5ML IV SOLN: 2.0000 mg | INTRAVENOUS | Status: DC | PRN

## 2017-03-26 MED ORDER — CILOSTAZOL 100 MG PO TABS
100.0000 mg | ORAL_TABLET | Freq: Two times a day (BID) | ORAL | Status: DC
  Filled 2017-03-26: qty 1

## 2017-03-26 MED ORDER — ROCURONIUM BROMIDE 10 MG/ML (PF) SYRINGE
PREFILLED_SYRINGE | INTRAVENOUS | Status: AC
  Filled 2017-03-26: qty 5

## 2017-03-26 MED ORDER — SODIUM CHLORIDE 0.9 % IV SOLN
0.0000 ug/min | INTRAVENOUS | Status: DC
  Administered 2017-03-26: 20 ug/min via INTRAVENOUS
  Filled 2017-03-26: qty 1

## 2017-03-26 MED ORDER — PHENYLEPHRINE HCL 10 MG/ML IJ SOLN: 0.4000 mg | Freq: Once | INTRAMUSCULAR | Status: DC

## 2017-03-26 MED ORDER — SODIUM CHLORIDE 0.9 % IV SOLN
INTRAVENOUS | Status: DC
  Administered 2017-03-26: 22:00:00 via INTRAVENOUS

## 2017-03-26 MED ORDER — SODIUM CHLORIDE 0.9 % IV SOLN
INTRAVENOUS | Status: DC | PRN
  Administered 2017-03-26: 500 mL

## 2017-03-26 MED ORDER — POLYETHYLENE GLYCOL 3350 17 G PO PACK: 17.0000 g | PACK | Freq: Every day | ORAL | Status: DC | PRN

## 2017-03-26 MED ORDER — ONDANSETRON HCL 4 MG/2ML IJ SOLN: 4.0000 mg | Freq: Four times a day (QID) | INTRAMUSCULAR | Status: DC | PRN

## 2017-03-26 MED ORDER — HEPARIN SODIUM (PORCINE) 1000 UNIT/ML IJ SOLN
INTRAMUSCULAR | Status: AC
  Filled 2017-03-26: qty 1

## 2017-03-26 MED ORDER — POTASSIUM CHLORIDE CRYS ER 20 MEQ PO TBCR
20.0000 meq | EXTENDED_RELEASE_TABLET | Freq: Every day | ORAL | Status: AC | PRN
  Administered 2017-03-27: 40 meq via ORAL
  Filled 2017-03-26: qty 2

## 2017-03-26 MED ORDER — LIDOCAINE-EPINEPHRINE 0.5 %-1:200000 IJ SOLN
INTRAMUSCULAR | Status: AC
  Filled 2017-03-26: qty 1

## 2017-03-26 MED ORDER — ONDANSETRON HCL 4 MG/2ML IJ SOLN
INTRAMUSCULAR | Status: AC
  Filled 2017-03-26: qty 2

## 2017-03-26 MED ORDER — ASPIRIN EC 81 MG PO TBEC
81.0000 mg | DELAYED_RELEASE_TABLET | Freq: Every day | ORAL | Status: DC
  Administered 2017-03-27: 81 mg via ORAL
  Filled 2017-03-26: qty 1

## 2017-03-26 MED ORDER — LABETALOL HCL 5 MG/ML IV SOLN: 10.0000 mg | INTRAVENOUS | Status: DC | PRN

## 2017-03-26 MED ORDER — PROPOFOL 10 MG/ML IV BOLUS
INTRAVENOUS | Status: DC | PRN
  Administered 2017-03-26: 150 mg via INTRAVENOUS
  Administered 2017-03-26: 30 mg via INTRAVENOUS

## 2017-03-26 MED ORDER — FENOFIBRATE 160 MG PO TABS
160.0000 mg | ORAL_TABLET | Freq: Every day | ORAL | Status: DC
  Administered 2017-03-27: 160 mg via ORAL
  Filled 2017-03-26: qty 1

## 2017-03-26 MED ORDER — ACETAMINOPHEN 10 MG/ML IV SOLN
INTRAVENOUS | Status: AC
  Administered 2017-03-26: 1000 mg via INTRAVENOUS
  Filled 2017-03-26: qty 100

## 2017-03-26 MED ORDER — ROCURONIUM BROMIDE 100 MG/10ML IV SOLN
INTRAVENOUS | Status: DC | PRN
  Administered 2017-03-26: 50 mg via INTRAVENOUS

## 2017-03-26 MED ORDER — FENTANYL CITRATE (PF) 100 MCG/2ML IJ SOLN
INTRAMUSCULAR | Status: DC | PRN
  Administered 2017-03-26: 50 ug via INTRAVENOUS

## 2017-03-26 MED ORDER — BUPIVACAINE-EPINEPHRINE (PF) 0.5% -1:200000 IJ SOLN
INTRAMUSCULAR | Status: AC
  Filled 2017-03-26: qty 30

## 2017-03-26 MED ORDER — DEXTROSE 5 % IV SOLN
INTRAVENOUS | Status: AC
  Filled 2017-03-26: qty 1.5

## 2017-03-26 MED ORDER — CHLORHEXIDINE GLUCONATE 4 % EX LIQD: 60.0000 mL | Freq: Once | CUTANEOUS | Status: DC

## 2017-03-26 MED ORDER — SUCCINYLCHOLINE CHLORIDE 200 MG/10ML IV SOSY
PREFILLED_SYRINGE | INTRAVENOUS | Status: AC
  Filled 2017-03-26: qty 10

## 2017-03-26 MED ORDER — ONDANSETRON HCL 4 MG/2ML IJ SOLN: 4.0000 mg | Freq: Once | INTRAMUSCULAR | Status: DC | PRN

## 2017-03-26 MED ORDER — OXYCODONE-ACETAMINOPHEN 5-325 MG PO TABS
1.0000 | ORAL_TABLET | ORAL | Status: DC | PRN
  Administered 2017-03-26: 2 via ORAL
  Administered 2017-03-26 (×2): 1 via ORAL
  Administered 2017-03-27: 2 via ORAL
  Filled 2017-03-26: qty 1
  Filled 2017-03-26: qty 2

## 2017-03-26 MED ORDER — PAROXETINE HCL 20 MG PO TABS
20.0000 mg | ORAL_TABLET | Freq: Every day | ORAL | Status: DC
  Administered 2017-03-27: 20 mg via ORAL
  Filled 2017-03-26: qty 1

## 2017-03-26 MED ORDER — MORPHINE SULFATE (PF) 4 MG/ML IV SOLN
INTRAVENOUS | Status: AC
  Filled 2017-03-26: qty 1

## 2017-03-26 MED ORDER — ONDANSETRON HCL 4 MG/2ML IJ SOLN
INTRAMUSCULAR | Status: DC | PRN
  Administered 2017-03-26: 4 mg via INTRAVENOUS

## 2017-03-26 MED ORDER — 0.9 % SODIUM CHLORIDE (POUR BTL) OPTIME
TOPICAL | Status: DC | PRN
  Administered 2017-03-26: 2000 mL

## 2017-03-26 MED ORDER — LIDOCAINE HCL (PF) 1 % IJ SOLN
INTRAMUSCULAR | Status: AC
  Filled 2017-03-26: qty 30

## 2017-03-26 MED ORDER — BUPIVACAINE-EPINEPHRINE 0.5% -1:200000 IJ SOLN
INTRAMUSCULAR | Status: DC | PRN
  Administered 2017-03-26: 30 mL

## 2017-03-26 MED ORDER — CHLORTHALIDONE 25 MG PO TABS
25.0000 mg | ORAL_TABLET | Freq: Every day | ORAL | Status: DC
  Filled 2017-03-26: qty 1

## 2017-03-26 MED ORDER — LABETALOL HCL 5 MG/ML IV SOLN
INTRAVENOUS | Status: DC | PRN
  Administered 2017-03-26: 10 mg via INTRAVENOUS

## 2017-03-26 MED ORDER — MAGNESIUM SULFATE 2 GM/50ML IV SOLN: 2.0000 g | Freq: Every day | INTRAVENOUS | Status: DC | PRN

## 2017-03-26 SURGICAL SUPPLY — 46 items
BAG DECANTER FOR FLEXI CONT (MISCELLANEOUS) ×3 IMPLANT
CANISTER SUCT 3000ML PPV (MISCELLANEOUS) ×3 IMPLANT
CANNULA VESSEL 3MM 2 BLNT TIP (CANNULA) ×6 IMPLANT
CATH ROBINSON RED A/P 18FR (CATHETERS) ×3 IMPLANT
CLIP LIGATING EXTRA MED SLVR (CLIP) ×3 IMPLANT
CLIP LIGATING EXTRA SM BLUE (MISCELLANEOUS) ×3 IMPLANT
CRADLE DONUT ADULT HEAD (MISCELLANEOUS) ×3 IMPLANT
DECANTER SPIKE VIAL GLASS SM (MISCELLANEOUS) IMPLANT
DERMABOND ADVANCED (GAUZE/BANDAGES/DRESSINGS) ×2
DERMABOND ADVANCED .7 DNX12 (GAUZE/BANDAGES/DRESSINGS) ×1 IMPLANT
DRAIN HEMOVAC 1/8 X 5 (WOUND CARE) IMPLANT
ELECT REM PT RETURN 9FT ADLT (ELECTROSURGICAL) ×3
ELECTRODE REM PT RTRN 9FT ADLT (ELECTROSURGICAL) ×1 IMPLANT
EVACUATOR SILICONE 100CC (DRAIN) IMPLANT
GLOVE BIO SURGEON STRL SZ7 (GLOVE) ×3 IMPLANT
GLOVE BIOGEL PI IND STRL 6.5 (GLOVE) ×4 IMPLANT
GLOVE BIOGEL PI INDICATOR 6.5 (GLOVE) ×8
GLOVE ECLIPSE 6.5 STRL STRAW (GLOVE) ×3 IMPLANT
GLOVE SS BIOGEL STRL SZ 7.5 (GLOVE) ×1 IMPLANT
GLOVE SUPERSENSE BIOGEL SZ 7.5 (GLOVE) ×2
GLOVE SURG SS PI 6.5 STRL IVOR (GLOVE) ×3 IMPLANT
GOWN STRL NON-REIN LRG LVL3 (GOWN DISPOSABLE) ×3 IMPLANT
GOWN STRL REUS W/ TWL LRG LVL3 (GOWN DISPOSABLE) ×3 IMPLANT
GOWN STRL REUS W/ TWL XL LVL3 (GOWN DISPOSABLE) ×1 IMPLANT
GOWN STRL REUS W/TWL LRG LVL3 (GOWN DISPOSABLE) ×6
GOWN STRL REUS W/TWL XL LVL3 (GOWN DISPOSABLE) ×2
KIT BASIN OR (CUSTOM PROCEDURE TRAY) ×3 IMPLANT
KIT ROOM TURNOVER OR (KITS) ×3 IMPLANT
KIT SHUNT ARGYLE CAROTID ART 6 (VASCULAR PRODUCTS) IMPLANT
NEEDLE 22X1 1/2 (OR ONLY) (NEEDLE) IMPLANT
NS IRRIG 1000ML POUR BTL (IV SOLUTION) ×6 IMPLANT
PACK CAROTID (CUSTOM PROCEDURE TRAY) ×3 IMPLANT
PAD ARMBOARD 7.5X6 YLW CONV (MISCELLANEOUS) ×6 IMPLANT
PATCH HEMASHIELD 8X75 (Vascular Products) ×3 IMPLANT
SHUNT CAROTID BYPASS 10 (VASCULAR PRODUCTS) ×3 IMPLANT
SHUNT CAROTID BYPASS 12FRX15.5 (VASCULAR PRODUCTS) IMPLANT
SUT ETHILON 3 0 PS 1 (SUTURE) IMPLANT
SUT PROLENE 6 0 CC (SUTURE) ×6 IMPLANT
SUT SILK 3 0 (SUTURE)
SUT SILK 3-0 18XBRD TIE 12 (SUTURE) IMPLANT
SUT VIC AB 3-0 SH 27 (SUTURE) ×4
SUT VIC AB 3-0 SH 27X BRD (SUTURE) ×2 IMPLANT
SUT VICRYL 4-0 PS2 18IN ABS (SUTURE) ×3 IMPLANT
SYR CONTROL 10ML LL (SYRINGE) IMPLANT
TOWEL GREEN STERILE (TOWEL DISPOSABLE) ×3 IMPLANT
WATER STERILE IRR 1000ML POUR (IV SOLUTION) ×3 IMPLANT

## 2017-03-26 NOTE — Anesthesia Preprocedure Evaluation (Signed)
Anesthesia Evaluation  Patient identified by MRN, date of birth, ID band Patient awake    Reviewed: Allergy & Precautions, NPO status , Patient's Chart, lab work & pertinent test results  Airway Mallampati: III  TM Distance: >3 FB Neck ROM: Full    Dental  (+) Edentulous Upper, Partial Lower   Pulmonary former smoker,    breath sounds clear to auscultation       Cardiovascular hypertension,  Rhythm:Regular Rate:Normal     Neuro/Psych    GI/Hepatic   Endo/Other    Renal/GU      Musculoskeletal   Abdominal   Peds  Hematology   Anesthesia Other Findings   Reproductive/Obstetrics                             Anesthesia Physical Anesthesia Plan  ASA: III  Anesthesia Plan: General   Post-op Pain Management:    Induction: Intravenous  PONV Risk Score and Plan: Ondansetron and Dexamethasone  Airway Management Planned: Oral ETT  Additional Equipment: Arterial line  Intra-op Plan:   Post-operative Plan: Extubation in OR  Informed Consent: I have reviewed the patients History and Physical, chart, labs and discussed the procedure including the risks, benefits and alternatives for the proposed anesthesia with the patient or authorized representative who has indicated his/her understanding and acceptance.     Plan Discussed with: CRNA and Anesthesiologist  Anesthesia Plan Comments:         Anesthesia Quick Evaluation

## 2017-03-26 NOTE — Consult Note (Signed)
Name: Earl Mueller MRN: [id number] DOB: 02-15-1936    ADMISSION DATE:  03/26/2017 CONSULTATION DATE:  03/26/2016  REFERRING MD :  Dr. 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 Donnetta Hutching.  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    Patient underwent surgery without apparent complication, EBL 245 ml, extubated to 3L [place], however post-operatively remained hypotensive in PACU requiring low-dose neo-synephrine.  Therefore, PCCM consulted for management of hypotension.  PAST MEDICAL HISTORY :   has a past medical history of Anxiety, Arthritis, Cancer ([place]), Coronary artery disease, Hypertension, Hypothyroidism, Peripheral vascular disease ([place]), and Stroke ([place]).  has a past surgical history that includes Back surgery; Stomach surgery; caritid endart; femerao bypass; Vascular surgery; Cardiac catheterization (03/06/10); Scalp laceration repair (N/A, 05/23/2013); and LEFT HEART CATH AND CORONARY ANGIOGRAPHY (N/A, 02/03/2017). Prior to Admission medications   Medication Sig Start Date End Date Taking? Authorizing Provider  aspirin EC 81 MG tablet Take 81 mg by mouth daily.   Yes [provider]  atorvastatin (LIPITOR) 80 MG tablet Take 80 mg by mouth daily at 6 PM.  12/28/16  Yes [provider]  chlorthalidone (HYGROTON) 25 MG tablet Take 25 mg by mouth daily.  01/01/17  Yes [provider]  cilostazol (PLETAL) 100 MG tablet Take 100 mg by mouth 2 (two) times daily.   Yes [provider]  cloNIDine (CATAPRES) 0.1 MG tablet Take 0.1 mg by  mouth 2 (two) times daily. 01/08/17  Yes [provider]  clopidogrel (PLAVIX) 75 MG tablet Take 75 mg by mouth daily with breakfast.   Yes [provider]  fenofibrate 160 MG tablet Take 160 mg by mouth daily.   Yes [provider]  levothyroxine (SYNTHROID, LEVOTHROID) 88 MCG tablet Take 88 mcg by mouth once daily before breakfast 12/21/16  Yes [provider]  Multiple Vitamins-Minerals (CENTRUM SILVER PO) Take 1 tablet by mouth daily.   Yes [provider]  nitroGLYCERIN (NITROSTAT) 0.4 MG SL tablet Place 0.4 mg under the tongue every 5 (five) minutes as needed for chest pain.   Yes [provider]  PARoxetine (PAXIL) 20 MG tablet Take 20 mg by mouth daily.   Yes [provider]  potassium chloride (K-DUR,KLOR-CON) 10 MEQ tablet Take 10 mEq by mouth 2 (two) times daily.   Yes [provider]  aspirin-acetaminophen-caffeine (EXCEDRIN MIGRAINE) [phone] MG tablet Take 2 tablets by mouth every 6 (six) hours as needed for headache.     [provider]   Allergies  Allergen Reactions  . Other Other (See Comments)    Other reaction(s): Delusions (intolerance) ALL NARCOTICS- Any narcotic makes him a "wild man"    FAMILY HISTORY:  family history includes Heart failure in his father.   SOCIAL HISTORY:  reports that he quit smoking about 9 years ago. he has never used smokeless tobacco. He reports that he drinks alcohol. He reports that he does not use drugs.  REVIEW OF SYSTEMS:   POSITIVES IN BOLD Constitutional: Negative for fever, chills, and diaphoresis. Throat pain Eyes: Blindness of right eye chronic  Respiratory: Negative for cough, hemoptysis, sputum  production, shortness of breath, wheezing and stridor.   Cardiovascular: Negative for chest pain, orthopnea, leg swelling.  Gastrointestinal: Negative for nausea, vomiting, abdominal pain  Skin: Negative for itching and rash.  Neurological: Negative for  dizziness, tingling, tremors, sensory change, speech change, focal weakness, weakness and headaches.   SUBJECTIVE:  Patient complains of severe throat pain.    VITAL SIGNS: Temp:  [97.2 F (36.2 C)-97.8 F (36.6 C)] 97.7 F (36.5 C) (01/11 1530) Pulse Rate:  [49-93] 62 (01/11 1640) Resp:  [7-24] 19 (01/11 1640) BP: (78-175)/(47-84) 123/49 (01/11 1630) SpO2:  [10 %-100 %] 99 % (01/11 1640) Arterial Line BP: (79-191)/(31-70) 116/45 (01/11 1640)  PHYSICAL EXAMINATION: General:  Elderly adult male sitting up in bed in NAD HEENT: MM pink/moist, pupils 3/=/ reactive, wearing glasses, incision to left neck clean/dry/ approximated, soft, no discoloration, no stridor, normal voice, swallows sips of water without difficulty Neuro: AOx 3, MAE CV: rrr, 2/6 SEM, absent pedal pulses- which is chronic for patient, distally warm  PULM: even/non-labored, lungs bilaterally CTA FF:MBWG, non-tender, bs active  Extremities: warm/dry, no edema  Skin: no rashes   Recent Labs  Lab 03/22/17 1422  NA 135  K 3.3*  CL 101  CO2 26  BUN 24*  CREATININE 1.54*  GLUCOSE 104*   Recent Labs  Lab 03/22/17 1422  HGB 13.1  HCT 40.0  WBC 8.2  PLT 259   No results found.   LINES:  PIV x2 Left radial Aline   ASSESSMENT / PLAN:  Hypotension - likely related to sedation with fentanyl prn (cumulative 150 mcg), versed 1mg  IV and 3 percocet tabs 5/325 while in PACU - EBL 120 ml in OR - afebrile - UOP 100 ml post-operatively since 11 am, s/p total 1.8L LR  - alert and oriented, no acute distress P:  tx to ICU  Tele monitoring  Continue neo for map > 65 Additional NS bolus 500 ml with clear lungs and hx of normal EF per [facility] 01/2017 CBC to rule out ABLA BMET now  Monitor UOP; assess for retention/ bladder scan if no UOP Hold home hypertensives Continue aline for now while on vasopressors   Severe asymptomatic Left carotid internal carotid stenosis s/p  left carotidendarterectomy and Dacron patch  angioplasty on 1/11 P: - per Vascular Surgery - d/c'd morphine as he states it makes him hallucinate - fentanyl 25-50 mcgs q 2 hr for severe pain - other pain medications- tylenol, percocet, tramadol per primary - chloraseptic spray for throat soreness   Remainder per Primary team   DVT prophylaxis: SCDs SUP: not indicated Diet: per VSS, sips w/meds Activity: bedrest Disposition : ICU while on vasopressors  CCT 45 mins  Kennieth Rad, AGACNP-BC [place] Pgr: [phone] or if no answer [phone] 03/26/2017, 5:57 PM

## 2017-03-26 NOTE — Anesthesia Procedure Notes (Signed)
Arterial Line Insertion Start/End1/01/2018 6:45 AM, 03/26/2017 6:55 AM Performed by: Izora Gala, CRNA, CRNA  Patient location: Pre-op. Left, radial was placed Catheter size: 20 G Hand hygiene performed  and maximum sterile barriers used   Attempts: 1 Procedure performed without using ultrasound guided technique. Following insertion, dressing applied and Biopatch. Post procedure assessment: unchanged  Patient tolerated the procedure well with no immediate complications.

## 2017-03-26 NOTE — Anesthesia Postprocedure Evaluation (Signed)
Anesthesia Post Note  Patient: Earl Mueller  Procedure(s) Performed: ENDARTERECTOMY CAROTID LEFT (Left ) PATCH ANGIOPLASTY LEFT CAROTID ARTERY (Left )     Patient location during evaluation: PACU Anesthesia Type: General Level of consciousness: awake and alert Pain management: pain level controlled Vital Signs Assessment: post-procedure vital signs reviewed and stable Respiratory status: spontaneous breathing, nonlabored ventilation, respiratory function stable and patient connected to nasal cannula oxygen Cardiovascular status: blood pressure returned to baseline and stable Postop Assessment: no apparent nausea or vomiting Anesthetic complications: no    Last Vitals:  Vitals:   03/26/17 1830 03/26/17 1835  BP:  (!) 111/94  Pulse: 67 67  Resp: 20 17  Temp:  36.5 C  SpO2: 100% 99%    Last Pain:  Vitals:   03/26/17 1500  TempSrc:   PainSc: 0-No pain                 Sayan Aldava COKER

## 2017-03-26 NOTE — H&P (Signed)
Office Visit   01/13/2017 [facility] St[facility], Arvilla Meres, MD  Vascular Surgery   Stenosis of left carotid artery  Dx   New Patient (Initial Visit)   ; Referred by Rochel Brome, MD  Reason for Visit   Additional Documentation   Vitals:   BP 123/71 (BP Location: Right Arm, Patient Position: Sitting, Cuff Size: Normal)   Pulse 65   Temp 97.4 F (36.3 C) Abnormal  (Oral)   Resp 16   Ht 5\' 10"  (1.778 m)   Wt 145 lb (65.8 kg)   SpO2 98%   BMI 20.81 kg/m   BSA 1.8 m      More Vitals   Flowsheets:   Infectious Disease Screening,   Healthcare Directives,   Clinical Intake,   Vital Signs,   MEWS Score,   Anthropometrics     Encounter Info:   Billing Info,   History,   Allergies,   Detailed Report     All Notes   Progress Notes by Rosetta Posner, MD at 01/13/2017 9:30 AM   Author: Rosetta Posner, MD Author Type: Physician Filed: 01/13/2017 11:44 AM  Note Status: Signed Cosign: Cosign Not Required Encounter Date: 01/13/2017  Editor: Rosetta Posner, MD (Physician)                                        [facility]  Patient name: Earl Mueller           MRN: [id number]        DOB: [birth year]          Sex: male  REASON FOR CONSULT: Evaluation of internal carotid artery stenosis  HPI: Earl Mueller is a 82 y.o. male, who is here today for evaluation of severe left internal carotid artery stenosis.  He is here today with his wife.  He reports that he had undergone right carotid endarterectomy and Evaro approximately 6 years ago.  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  Evaluation of the time revealed high-grade right carotid stenosis and he underwent right carotid endarterectomy.  He has had no stroke symptoms.  He has had no a aphasia or focal weakness.  He is right-handed.  He does have a history of coronary disease.  Also has a history  of peripheral vascular occlusive disease with aortobifemoral bypass grafting greater than 10 years ago.  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  No rest pain.  No tissue loss.      Past Medical History:  Diagnosis Date  . Anxiety   . Cancer ([place])    skin  . Coronary artery disease    Dr. Geraldo Pitter with [facility]  . Hypertension    dr Lennox Solders  . Hypothyroidism   . Peripheral vascular disease ([place])   . Stroke [facility])    rt eye blind         Family History  Problem Relation Age of Onset  . Heart failure Father     SOCIAL HISTORY: Social History        Social History  . Marital status: Married    Spouse name: N/A  . Number of children: N/A  . Years of education: N/A  Occupational History  . Not on file.         Social History Main Topics  . Smoking status: Former Smoker    Quit date: 12/18/2007  . Smokeless tobacco: Never Used  . Alcohol use Yes     Comment: occ  . Drug use: No  . Sexual activity: Not on file       Other Topics Concern  . Not on file      Social History Narrative  . No narrative on file         Allergies  Allergen Reactions  . Other Other (See Comments)    Other reaction(s): Delusions (intolerance) ALL NARCOTICS- Any narcotic makes him a "wild man"          Current Outpatient Prescriptions  Medication Sig Dispense Refill  . aspirin EC 81 MG tablet Take 81 mg by mouth daily.    Marland Kitchen atorvastatin (LIPITOR) 20 MG tablet Take 20 mg by mouth 2 (two) times daily.    . cilostazol (PLETAL) 100 MG tablet Take 100 mg by mouth 2 (two) times daily.    . clopidogrel (PLAVIX) 75 MG tablet Take 75 mg by mouth daily with breakfast.    . ezetimibe (ZETIA) 10 MG tablet Take 10 mg by mouth daily.    . fenofibrate 160 MG tablet Take 160 mg by mouth daily.    . isosorbide dinitrate (ISORDIL) 30 MG tablet  Take 30 mg by mouth daily.    Marland Kitchen levothyroxine (SYNTHROID, LEVOTHROID) 88 MCG tablet Take 88 mcg by mouth daily before breakfast.    . Multiple Vitamins-Minerals (CENTRUM SILVER PO) Take 1 tablet by mouth daily.    . nitroGLYCERIN (NITROSTAT) 0.4 MG SL tablet Place 0.4 mg under the tongue every 5 (five) minutes as needed for chest pain.    Marland Kitchen PARoxetine (PAXIL) 20 MG tablet Take 20 mg by mouth daily.     No current facility-administered medications for this visit.     REVIEW OF SYSTEMS:  [X]  denotes positive finding, [ ]  denotes negative finding Cardiac  Comments:  Chest pain or chest pressure:    Shortness of breath upon exertion:    Short of breath when lying flat:    Irregular heart rhythm:        Vascular    Pain in calf, thigh, or hip brought on by ambulation:    Pain in feet at night that wakes you up from your sleep:     Blood clot in your veins:    Leg swelling:         Pulmonary    Oxygen at home:    Productive cough:     Wheezing:         Neurologic    Sudden weakness in arms or legs:     Sudden numbness in arms or legs:     Sudden onset of difficulty speaking or slurred speech:    Temporary loss of vision in one eye:  x   Problems with dizziness:         Gastrointestinal    Blood in stool:     Vomited blood:         Genitourinary    Burning when urinating:     Blood in urine:        Psychiatric    Major depression:         Hematologic    Bleeding problems:    Problems with blood clotting too easily:  Skin    Rashes or ulcers:        Constitutional    Fever or chills:      PHYSICAL EXAM:     Vitals:   01/13/17 0926 01/13/17 0930  BP: (!) 143/68 123/71  Pulse: 65   Resp: 16   Temp: (!) 97.4 F (36.3 C)   TempSrc: Oral   SpO2: 98%   Weight: 145 lb (65.8 kg)   Height: 5\' 10"  (1.778 m)     GENERAL: The patient is a  well-nourished male, in no acute distress. The vital signs are documented above. CARDIOVASCULAR: Well-healed right carotid incision.  No bruits bilaterally.  2+ radial and 2+ femoral pulses.  Absent popliteal and distal pulses bilaterally. PULMONARY: There is good air exchange  ABDOMEN: Soft and non-tender.  Well-healed midline incision.  MUSCULOSKELETAL: There are no major deformities or cyanosis. NEUROLOGIC: No focal weakness or paresthesias are detected. SKIN: There are no ulcers or rashes noted.  Cyanosis both lower extremities related to venous hypertension  pSYCHIATRIC: The patient has a normal affect.  DATA:  Carotid duplex today was reviewed with the patient and his wife present.  This shows widely patent endarterectomy on the right.  He does have critical stenosis in the left internal carotid artery.  The distal internal carotid artery is patent.  MEDICAL ISSUES: I have recommended endarterectomy on the basis of his duplex showing critical disease.  Discussed the procedure would be the same as what he had approximately 6 years ago.  Explained the 1-1-1/2% risk of stroke with surgery also the possibility of cranial nerve injury in the low risk of bleeding and infection with the procedure as well.  Patient understands and is to proceed as soon as possible.  Explained that he would be admitted overnight with the plan for discharge on postop day 1   Rosetta Posner, MD [facility] [phone] Pager [phone]       Addendum:  The patient has been re-examined and re-evaluated.  The patient's history and physical has been reviewed and is unchanged.    Earl Mueller is a 82 y.o. male is being admitted with Left Carotid Stenosis. All the risks, benefits and other treatment options have been discussed with the patient. The patient has consented to proceed with Procedure(s): ENDARTERECTOMY CAROTID LEFT as a surgical  intervention.  Tyisha Cressy 03/26/2017 7:17 AM Vascular and Vein Surgery

## 2017-03-26 NOTE — Op Note (Signed)
OPERATIVE REPORT  DATE OF SURGERY: 03/26/2017  PATIENT: Earl Mueller, 82 y.o. male MRN: [id number]  DOB: [birth year]  PRE-OPERATIVE DIAGNOSIS: Severe asymptomatic left internal carotid artery stenosis  POST-OPERATIVE DIAGNOSIS:  Same  PROCEDURE: Left carotid endarterectomy and Dacron patch angioplasty  SURGEON:  Curt Jews, M.D.  PHYSICIAN ASSISTANT: Matt Eveland PA-C  ANESTHESIA: General  EBL: 120 ml  Total I/O In: 1800 [I.V.:1800] Out: 220 [Urine:100; Blood:120]  BLOOD ADMINISTERED: None  DRAINS: None  SPECIMEN: None  COUNTS CORRECT:  YES  PLAN OF CARE: PACU  PATIENT DISPOSITION:  PACU - hemodynamically stable  PROCEDURE DETAILS: The patient was taken to the operating placed in supine position where the area of the left neck was prepped and draped in usual sterile fashion.  Incision was made anterior to the sternocleidomastoid and carried down through the platysma with electrocautery.  The sternocleidomastoid reflected posteriorly and the carotid sheath was opened.  Facial vein was ligated with 3-0 silk ties and the.  The vagus and hypoglossal nerves were identified and preserved.  The common carotid artery was encircled with an umbilical tape and Rummel tourniquet.  The external carotid was encircled with a blue vessel loop.  The superior thyroid artery was encircled with a red vessel loop and a Potts tie.  Patient did have a small a Barrett vessel arising the artery and this was controlled with a 2-0 silk Potts tie.  The internal carotid was encircled with an umbilical tape and Rummel tourniquet.  The patient was given 7000 units of intravenous heparin and after adequate circulation time the internal/external and common carotid arteries were occluded.  The common carotid artery was opened with an 11 blade and sent along [place] with Potts scissors through the plaque onto the internal carotid.  A 10 shunt was passed up the internal carotid and down the internal carotid  where it was secured with a Rummel tourniquet.  There was subtotal occlusion of the internal carotid at its origin.  The endarterectomy was again on the common carotid artery and the plaque was divided proximally with Potts scissors.  The endarterectomy was extended onto the bifurcation.  Patient had been very stable from a heart rate and blood pressure standpoint.  At this part of the procedure the patient became unexplainably severely hypertensive with blood pressures into the 174B systolic and a heart rate into the 120s.  This did resolve after several minutes.  I reoccluded the shunt during this extreme hypertension and then release it as a blood pressure normalized.  The endarterectomy was completed with an eversion technique into the external carotid and internal carotid was in our.  Remaining atheromatous debris was removed from the end of a Finesse Hemashield Dacron patch was brought onto the field and was sewn as a patch angioplasty with a running 6-0 Prolene suture.  Prior to completion of the closure the usual flushing maneuvers were undertaken and the shunt was removed.  The anastomosis was completed and flow was restored first to the external and the internal carotid artery.  The she had excellent flow characteristics with hand-held Doppler in the internal and external carotid artery.  The patient was given 50 mg of protamine to reverse the heparin.  Wounds irrigated with saline.  Hemostasis elect cautery.  Wounds were closed with 3-0 Vicryl to reapproximate the sternocleidomastoid over the carotid sheath.  Next the platysma was closed a running 3-0 Vicryl suture and finally the skin was closed with 4 septic or Vicryl stitch.  Sterile  dressing was applied.  The patient was awake and neurologically intact in the operating room was transferred to the recovery room in stable condition   Earl Mueller, M.D., [facility] 03/26/2017 10:49 AM

## 2017-03-26 NOTE — Progress Notes (Signed)
Talked to [facility] PA for Dr. Donnetta Hutching about patient on Neo drip, 4E is not taking patient on neo drip, only ICU. Matt PA will put an order for ICU.

## 2017-03-26 NOTE — Anesthesia Procedure Notes (Signed)
Procedure Name: Intubation Date/Time: 03/26/2017 7:43 AM Performed by: Izora Gala, CRNA Pre-anesthesia Checklist: Patient identified, Emergency Drugs available, Suction available and Patient being monitored Patient Re-evaluated:Patient Re-evaluated prior to induction Oxygen Delivery Method: Circle system utilized Preoxygenation: Pre-oxygenation with 100% oxygen Induction Type: IV induction Ventilation: Mask ventilation without difficulty and Oral airway inserted - appropriate to patient size Laryngoscope Size: Miller and 3 Grade View: Grade I Tube size: 7.5 mm Number of attempts: 1 Airway Equipment and Method: Stylet and LTA kit utilized Placement Confirmation: ETT inserted through vocal cords under direct vision,  positive ETCO2 and breath sounds checked- equal and bilateral Secured at: 22 cm Tube secured with: Tape Dental Injury: Teeth and Oropharynx as per pre-operative assessment

## 2017-03-26 NOTE — Transfer of Care (Signed)
Immediate Anesthesia Transfer of Care Note  Patient: Earl Mueller  Procedure(s) Performed: ENDARTERECTOMY CAROTID LEFT (Left ) PATCH ANGIOPLASTY LEFT CAROTID ARTERY (Left )  Patient Location: PACU  Anesthesia Type:General  Level of Consciousness: awake, alert , oriented and patient cooperative  Airway & Oxygen Therapy: Patient Spontanous Breathing and Patient connected to nasal cannula oxygen  Post-op Assessment: Report given to RN and Post -op Vital signs reviewed and stable  Post vital signs: Reviewed and stable  Last Vitals:  Vitals:   03/26/17 1002 03/26/17 1004  BP:  (!) 98/52  Pulse:  65  Resp:  15  Temp: (!) 36.2 C   SpO2:  100%    Last Pain:  Vitals:   03/26/17 0602  TempSrc: Oral      Patients Stated Pain Goal: 4 (56/70/14 1030)  Complications: No apparent anesthesia complications

## 2017-03-26 NOTE — Anesthesia Postprocedure Evaluation (Signed)
Anesthesia Post Note  Patient: Earl Mueller  Procedure(s) Performed: ENDARTERECTOMY CAROTID LEFT (Left ) PATCH ANGIOPLASTY LEFT CAROTID ARTERY (Left )     Patient location during evaluation: PACU Anesthesia Type: General Level of consciousness: awake and alert, patient cooperative and oriented Pain management: pain level controlled Vital Signs Assessment: post-procedure vital signs reviewed and stable Respiratory status: spontaneous breathing, nonlabored ventilation, respiratory function stable and patient connected to nasal cannula oxygen Cardiovascular status: blood pressure returned to baseline and stable Postop Assessment: no apparent nausea or vomiting Anesthetic complications: no    Last Vitals:  Vitals:   03/26/17 1512 03/26/17 1515  BP: (!) 158/52   Pulse: 63 65  Resp: 10 (!) 9  Temp:    SpO2: 94% 94%    Last Pain:  Vitals:   03/26/17 1500  TempSrc:   PainSc: 0-No pain                 Antonius Hartlage,E. Dailyn Kempner

## 2017-03-27 DIAGNOSIS — I6522 Occlusion and stenosis of left carotid artery: Secondary | ICD-10-CM | POA: Diagnosis not present

## 2017-03-27 LAB — CBC
HCT: 33.3 % — ABNORMAL LOW (ref 39.0–52.0)
HEMOGLOBIN: 11.4 g/dL — AB (ref 13.0–17.0)
MCH: 32.6 pg (ref 26.0–34.0)
MCHC: 34.2 g/dL (ref 30.0–36.0)
MCV: 95.1 fL (ref 78.0–100.0)
PLATELETS: 224 10*3/uL (ref 150–400)
RBC: 3.5 MIL/uL — AB (ref 4.22–5.81)
RDW: 15.2 % (ref 11.5–15.5)
WBC: 13.2 10*3/uL — AB (ref 4.0–10.5)

## 2017-03-27 LAB — BASIC METABOLIC PANEL
ANION GAP: 8 (ref 5–15)
BUN: 15 mg/dL (ref 6–20)
CHLORIDE: 105 mmol/L (ref 101–111)
CO2: 22 mmol/L (ref 22–32)
Calcium: 8.1 mg/dL — ABNORMAL LOW (ref 8.9–10.3)
Creatinine, Ser: 1.07 mg/dL (ref 0.61–1.24)
Glucose, Bld: 176 mg/dL — ABNORMAL HIGH (ref 65–99)
POTASSIUM: 3.3 mmol/L — AB (ref 3.5–5.1)
SODIUM: 135 mmol/L (ref 135–145)

## 2017-03-27 NOTE — Progress Notes (Signed)
Earl Mueller to be D/C'd Home per MD order. Discussed with the patient and all questions fully answered.    VVS, Skin clean, dry and intact without evidence of skin break down, no evidence of skin tears noted.  IV catheter discontinued intact. Site without signs and symptoms of complications. Dressing and pressure applied.  An After Visit Summary was printed and given to the patient.  Patient escorted via [place], and D/C home via private auto.  [name]  03/27/2017 12:26 PM

## 2017-03-27 NOTE — Progress Notes (Addendum)
[facility] [place]  Subjective  - Doing well.  No complaints of dizziness or weakness.   Objective (!) 141/68 70 98.4 F (36.9 C) (Oral) 17 94%  Intake/Output Summary (Last 24 hours) at 03/27/2017 0852 Last data filed at 03/27/2017 0500 Gross per 24 hour  Intake 2207.5 ml  Output 1950 ml  Net 257.5 ml    Left neck incision healing well without hematoma. Grip 5/5 B, feet warm to touch non palpable pulses known PAD No tongue deviation and no facial droop noted Heart RRR Lungs non labored breathing  Assessment/Planning: POD # 1 left CEA  Patient was hypotensive post op.  Minimal blood loss intraoperative, good urine out put, extubated with out difficulty.  He was started on a NEO drip  And transferred to ICU for observation Cardiology was consulted.   Consult work up revealed no cardiac reason for hypotension and suggested discontinuing IV pain medication and continued Telemetry.   His BP has recovered, UO is good > 200 per Hr.  He is tolerating PO's ambulated, sitting up eating breakfast.  We will discharge him home today.  He will cont. Current home medications to include Clonidine and monitor his BP at home.  The family requested no narcotics be sent home.    No neuro deficits.  Stable disposition at baseline BP. Cont. Daily Plavix and Asa  F/U with Dr. Donnetta Hutching in 2-3 weeks  Roxy Horseman 03/27/2017 8:52 AM --  Laboratory Lab Results: Recent Labs    03/26/17 1755 03/27/17 0410  WBC 8.0 13.2*  HGB 11.9* 11.4*  HCT 35.0* 33.3*  PLT 241 224   BMET Recent Labs    03/26/17 1755 03/27/17 0410  NA 135 135  K 3.1* 3.3*  CL 103 105  CO2 23 22  GLUCOSE 166* 176*  BUN 22* 15  CREATININE 1.11 1.07  CALCIUM 8.3* 8.1*    COAG Lab Results  Component Value Date   INR 1.08 03/22/2017   INR 1.1 01/29/2017   No results found for: PTT    I have interviewed and examined patient with PA and agree with assessment and plan above.    Macari Zalesky C. Donzetta Matters, MD [facility] [place] Office: [phone] Pager: [phone]

## 2017-03-27 NOTE — Discharge Instructions (Signed)
   [facility] [place]  Discharge Instructions   Carotid Endarterectomy (CEA)  Please refer to the following instructions for your post-procedure care. Your surgeon or physician assistant will discuss any changes with you.  Activity  You are encouraged to walk as much as you can. You can slowly return to normal activities but must avoid strenuous activity and heavy lifting until your doctor tell you it's OK. Avoid activities such as vacuuming or swinging a golf club. You can drive after one week if you are comfortable and you are no longer taking prescription pain medications. It is normal to feel tired for serval weeks after your surgery. It is also normal to have difficulty with sleep habits, eating, and bowel movements after surgery. These will go away with time.  Bathing/Showering  You may shower after you come home. Do not soak in a bathtub, hot tub, or swim until the incision heals completely.  Incision Care  Shower every day. Clean your incision with mild soap and water. Pat the area dry with a clean towel. You do not need a bandage unless otherwise instructed. Do not apply any ointments or creams to your incision. You may have skin glue on your incision. Do not peel it off. It will come off on its own in about one week. Your incision may feel thickened and raised for several weeks after your surgery. This is normal and the skin will soften over time. For Men Only: It's OK to shave around the incision but do not shave the incision itself for 2 weeks. It is common to have numbness under your chin that could last for several months.  Diet  Resume your normal diet. There are no special food restrictions following this procedure. A low fat/low cholesterol diet is recommended for all patients with vascular disease. In order to heal from your surgery, it is CRITICAL to get adequate nutrition. Your body requires vitamins, minerals, and protein. Vegetables are the best  source of vitamins and minerals. Vegetables also provide the perfect balance of protein. Processed food has little nutritional value, so try to avoid this.        Medications  Resume taking all of your medications unless your doctor or physician assistant tells you not to. If your incision is causing pain, you may take over-the- counter pain relievers such as acetaminophen (Tylenol). If you were prescribed a stronger pain medication, please be aware these medications can cause nausea and constipation. Prevent nausea by taking the medication with a snack or meal. Avoid constipation by drinking plenty of fluids and eating foods with a high amount of fiber, such as fruits, vegetables, and grains. Do not take Tylenol if you are taking prescription pain medications.  Follow Up  Our office will schedule a follow up appointment 2-3 weeks following discharge.  Please call us immediately for any of the following conditions  Increased pain, redness, drainage (pus) from your incision site. Fever of 101 degrees or higher. If you should develop stroke (slurred speech, difficulty swallowing, weakness on one side of your body, loss of vision) you should call 911 and go to the nearest emergency room.  Reduce your risk of vascular disease:  Stop smoking. If you would like help call [facility] at 1-800-QUIT-NOW (1-800-784-8669) or [place] at 336-586-4000. Manage your cholesterol Maintain a desired weight Control your diabetes Keep your blood pressure down  If you have any questions, please call the office at [phone].   

## 2017-03-28 DIAGNOSIS — Z48812 Encounter for surgical aftercare following surgery on the circulatory system: Secondary | ICD-10-CM | POA: Diagnosis not present

## 2017-03-28 DIAGNOSIS — F419 Anxiety disorder, unspecified: Secondary | ICD-10-CM | POA: Diagnosis not present

## 2017-03-28 DIAGNOSIS — Z7982 Long term (current) use of aspirin: Secondary | ICD-10-CM | POA: Diagnosis not present

## 2017-03-28 DIAGNOSIS — I739 Peripheral vascular disease, unspecified: Secondary | ICD-10-CM | POA: Diagnosis not present

## 2017-03-28 DIAGNOSIS — I10 Essential (primary) hypertension: Secondary | ICD-10-CM | POA: Diagnosis not present

## 2017-03-28 DIAGNOSIS — Z87891 Personal history of nicotine dependence: Secondary | ICD-10-CM | POA: Diagnosis not present

## 2017-03-28 DIAGNOSIS — I25118 Atherosclerotic heart disease of native coronary artery with other forms of angina pectoris: Secondary | ICD-10-CM | POA: Diagnosis not present

## 2017-03-28 DIAGNOSIS — Z7902 Long term (current) use of antithrombotics/antiplatelets: Secondary | ICD-10-CM | POA: Diagnosis not present

## 2017-03-29 ENCOUNTER — Telehealth: Payer: Self-pay | Admitting: Vascular Surgery

## 2017-03-29 ENCOUNTER — Encounter (HOSPITAL_COMMUNITY): Payer: Self-pay | Admitting: Vascular Surgery

## 2017-03-29 DIAGNOSIS — I639 Cerebral infarction, unspecified: Secondary | ICD-10-CM | POA: Diagnosis present

## 2017-03-29 NOTE — Telephone Encounter (Signed)
Sched appt 04/27/17 at 2:00. Spoke to pt's wife to confirm appt.

## 2017-03-29 NOTE — Telephone Encounter (Signed)
Earl Mueller, PT with [facility] called and left voice message.  Patient is refusing to have physical therapy and they are discontinuing services.  Ashleigh E., LPN.

## 2017-03-29 NOTE — Care Management Note (Signed)
Case Management Note Marvetta Gibbons RN, BSN Unit 4E-Case Manager [phone]  Patient Details  Name: Earl Mueller MRN: [id number] Date of Birth: 1936/02/25  Subjective/Objective:   Pt admitted s/p CEA                Action/Plan: PTA pt lived at home- notified by Tiffany with [facility]- pt has pre-op referral for any HH needs- [facility] will f/u with pt post discharge.   Expected Discharge Date:  03/27/17               Expected Discharge Plan:  [place]  In-House Referral:  NA  Discharge planning Services  CM Consult  Post Acute Care Choice:  Home Health Choice offered to:  NA  DME Arranged:    DME Agency:     HH Arranged:    HH Agency:  [facility]  Status of Service:  Completed, signed off  If discussed at [place] of Stay Meetings, dates discussed:    Discharge Disposition: home/home health   Additional Comments:  Dawayne Patricia, RN 03/29/2017, 10:31 AM

## 2017-03-29 NOTE — Telephone Encounter (Signed)
-----   Message from Mena Goes, RN sent at 03/27/2017  2:07 PM EST ----- Regarding: 2-3 weeks postop CEA   ----- Message ----- From: Ulyses Amor, PA-C Sent: 03/27/2017   9:09 AM To: Vvs Charge Pool  S/P left CEA f/u with Dr. Donnetta Hutching in 2-3 weeks

## 2017-03-30 DIAGNOSIS — I739 Peripheral vascular disease, unspecified: Secondary | ICD-10-CM | POA: Diagnosis not present

## 2017-03-30 DIAGNOSIS — Z7982 Long term (current) use of aspirin: Secondary | ICD-10-CM | POA: Diagnosis not present

## 2017-03-30 DIAGNOSIS — F419 Anxiety disorder, unspecified: Secondary | ICD-10-CM | POA: Diagnosis not present

## 2017-03-30 DIAGNOSIS — I10 Essential (primary) hypertension: Secondary | ICD-10-CM | POA: Diagnosis not present

## 2017-03-30 DIAGNOSIS — I25118 Atherosclerotic heart disease of native coronary artery with other forms of angina pectoris: Secondary | ICD-10-CM | POA: Diagnosis not present

## 2017-03-30 DIAGNOSIS — Z48812 Encounter for surgical aftercare following surgery on the circulatory system: Secondary | ICD-10-CM | POA: Diagnosis not present

## 2017-03-30 DIAGNOSIS — Z7902 Long term (current) use of antithrombotics/antiplatelets: Secondary | ICD-10-CM | POA: Diagnosis not present

## 2017-03-30 DIAGNOSIS — Z87891 Personal history of nicotine dependence: Secondary | ICD-10-CM | POA: Diagnosis not present

## 2017-04-01 DIAGNOSIS — Z7902 Long term (current) use of antithrombotics/antiplatelets: Secondary | ICD-10-CM | POA: Diagnosis not present

## 2017-04-01 DIAGNOSIS — Z87891 Personal history of nicotine dependence: Secondary | ICD-10-CM | POA: Diagnosis not present

## 2017-04-01 DIAGNOSIS — I10 Essential (primary) hypertension: Secondary | ICD-10-CM | POA: Diagnosis not present

## 2017-04-01 DIAGNOSIS — F419 Anxiety disorder, unspecified: Secondary | ICD-10-CM | POA: Diagnosis not present

## 2017-04-01 DIAGNOSIS — I25118 Atherosclerotic heart disease of native coronary artery with other forms of angina pectoris: Secondary | ICD-10-CM | POA: Diagnosis not present

## 2017-04-01 DIAGNOSIS — Z7982 Long term (current) use of aspirin: Secondary | ICD-10-CM | POA: Diagnosis not present

## 2017-04-01 DIAGNOSIS — Z48812 Encounter for surgical aftercare following surgery on the circulatory system: Secondary | ICD-10-CM | POA: Diagnosis not present

## 2017-04-01 DIAGNOSIS — I739 Peripheral vascular disease, unspecified: Secondary | ICD-10-CM | POA: Diagnosis not present

## 2017-04-06 DIAGNOSIS — I739 Peripheral vascular disease, unspecified: Secondary | ICD-10-CM | POA: Diagnosis not present

## 2017-04-06 DIAGNOSIS — I25118 Atherosclerotic heart disease of native coronary artery with other forms of angina pectoris: Secondary | ICD-10-CM | POA: Diagnosis not present

## 2017-04-06 DIAGNOSIS — F419 Anxiety disorder, unspecified: Secondary | ICD-10-CM | POA: Diagnosis not present

## 2017-04-06 DIAGNOSIS — Z7902 Long term (current) use of antithrombotics/antiplatelets: Secondary | ICD-10-CM | POA: Diagnosis not present

## 2017-04-06 DIAGNOSIS — I10 Essential (primary) hypertension: Secondary | ICD-10-CM | POA: Diagnosis not present

## 2017-04-06 DIAGNOSIS — Z48812 Encounter for surgical aftercare following surgery on the circulatory system: Secondary | ICD-10-CM | POA: Diagnosis not present

## 2017-04-06 DIAGNOSIS — Z7982 Long term (current) use of aspirin: Secondary | ICD-10-CM | POA: Diagnosis not present

## 2017-04-06 DIAGNOSIS — Z87891 Personal history of nicotine dependence: Secondary | ICD-10-CM | POA: Diagnosis not present

## 2017-04-08 DIAGNOSIS — I739 Peripheral vascular disease, unspecified: Secondary | ICD-10-CM | POA: Diagnosis not present

## 2017-04-08 DIAGNOSIS — I25118 Atherosclerotic heart disease of native coronary artery with other forms of angina pectoris: Secondary | ICD-10-CM | POA: Diagnosis not present

## 2017-04-08 DIAGNOSIS — Z48812 Encounter for surgical aftercare following surgery on the circulatory system: Secondary | ICD-10-CM | POA: Diagnosis not present

## 2017-04-08 DIAGNOSIS — I10 Essential (primary) hypertension: Secondary | ICD-10-CM | POA: Diagnosis not present

## 2017-04-08 DIAGNOSIS — Z7902 Long term (current) use of antithrombotics/antiplatelets: Secondary | ICD-10-CM | POA: Diagnosis not present

## 2017-04-08 DIAGNOSIS — Z7982 Long term (current) use of aspirin: Secondary | ICD-10-CM | POA: Diagnosis not present

## 2017-04-08 DIAGNOSIS — F419 Anxiety disorder, unspecified: Secondary | ICD-10-CM | POA: Diagnosis not present

## 2017-04-08 DIAGNOSIS — Z87891 Personal history of nicotine dependence: Secondary | ICD-10-CM | POA: Diagnosis not present

## 2017-04-13 DIAGNOSIS — F419 Anxiety disorder, unspecified: Secondary | ICD-10-CM | POA: Diagnosis not present

## 2017-04-13 DIAGNOSIS — Z48812 Encounter for surgical aftercare following surgery on the circulatory system: Secondary | ICD-10-CM | POA: Diagnosis not present

## 2017-04-13 DIAGNOSIS — Z87891 Personal history of nicotine dependence: Secondary | ICD-10-CM | POA: Diagnosis not present

## 2017-04-13 DIAGNOSIS — I10 Essential (primary) hypertension: Secondary | ICD-10-CM | POA: Diagnosis not present

## 2017-04-13 DIAGNOSIS — I739 Peripheral vascular disease, unspecified: Secondary | ICD-10-CM | POA: Diagnosis not present

## 2017-04-13 DIAGNOSIS — Z7982 Long term (current) use of aspirin: Secondary | ICD-10-CM | POA: Diagnosis not present

## 2017-04-13 DIAGNOSIS — I25118 Atherosclerotic heart disease of native coronary artery with other forms of angina pectoris: Secondary | ICD-10-CM | POA: Diagnosis not present

## 2017-04-13 DIAGNOSIS — Z7902 Long term (current) use of antithrombotics/antiplatelets: Secondary | ICD-10-CM | POA: Diagnosis not present

## 2017-04-16 DIAGNOSIS — F419 Anxiety disorder, unspecified: Secondary | ICD-10-CM | POA: Diagnosis not present

## 2017-04-16 DIAGNOSIS — Z48812 Encounter for surgical aftercare following surgery on the circulatory system: Secondary | ICD-10-CM | POA: Diagnosis not present

## 2017-04-16 DIAGNOSIS — I25118 Atherosclerotic heart disease of native coronary artery with other forms of angina pectoris: Secondary | ICD-10-CM | POA: Diagnosis not present

## 2017-04-16 DIAGNOSIS — Z87891 Personal history of nicotine dependence: Secondary | ICD-10-CM | POA: Diagnosis not present

## 2017-04-16 DIAGNOSIS — Z7902 Long term (current) use of antithrombotics/antiplatelets: Secondary | ICD-10-CM | POA: Diagnosis not present

## 2017-04-16 DIAGNOSIS — I739 Peripheral vascular disease, unspecified: Secondary | ICD-10-CM | POA: Diagnosis not present

## 2017-04-16 DIAGNOSIS — I10 Essential (primary) hypertension: Secondary | ICD-10-CM | POA: Diagnosis not present

## 2017-04-16 DIAGNOSIS — Z7982 Long term (current) use of aspirin: Secondary | ICD-10-CM | POA: Diagnosis not present

## 2017-04-27 ENCOUNTER — Encounter: Payer: Self-pay | Admitting: Vascular Surgery

## 2017-04-27 ENCOUNTER — Other Ambulatory Visit: Payer: Self-pay

## 2017-04-27 ENCOUNTER — Ambulatory Visit (INDEPENDENT_AMBULATORY_CARE_PROVIDER_SITE_OTHER): Payer: Medicare HMO | Admitting: Vascular Surgery

## 2017-04-27 VITALS — BP 136/76 | HR 94 | Temp 96.7°F | Resp 14 | Ht 66.5 in | Wt 141.0 lb

## 2017-04-27 DIAGNOSIS — I6522 Occlusion and stenosis of left carotid artery: Secondary | ICD-10-CM

## 2017-04-27 NOTE — Progress Notes (Signed)
   Patient name: Earl Mueller MRN: [id number] DOB: [birth year] Sex: male  REASON FOR VISIT: Follow-up left carotid endarterectomy for severe asymptomatic disease on 03/26/2017  HPI: Earl Mueller is a 82 y.o. male here today for follow-up.  He did well with his surgery and has had no neurologic deficits or other problems.  He reports typical peri-incisional numbness.  Current Outpatient Medications  Medication Sig Dispense Refill  . aspirin EC 81 MG tablet Take 81 mg by mouth daily.    Marland Kitchen aspirin-acetaminophen-caffeine (EXCEDRIN MIGRAINE) 250-250-65 MG tablet Take 2 tablets by mouth every 6 (six) hours as needed for headache.     Marland Kitchen atorvastatin (LIPITOR) 80 MG tablet Take 80 mg by mouth daily at 6 PM.     . chlorthalidone (HYGROTON) 25 MG tablet Take 25 mg by mouth daily.     . cilostazol (PLETAL) 100 MG tablet Take 100 mg by mouth 2 (two) times daily.    . cloNIDine (CATAPRES) 0.1 MG tablet Take 0.1 mg by mouth 2 (two) times daily.    . clopidogrel (PLAVIX) 75 MG tablet Take 75 mg by mouth daily with breakfast.    . fenofibrate 160 MG tablet Take 160 mg by mouth daily.    Marland Kitchen levothyroxine (SYNTHROID, LEVOTHROID) 88 MCG tablet Take 88 mcg by mouth once daily before breakfast  1  . Multiple Vitamins-Minerals (CENTRUM SILVER PO) Take 1 tablet by mouth daily.    . nitroGLYCERIN (NITROSTAT) 0.4 MG SL tablet Place 0.4 mg under the tongue every 5 (five) minutes as needed for chest pain.    Marland Kitchen PARoxetine (PAXIL) 20 MG tablet Take 20 mg by mouth daily.    . potassium chloride (K-DUR,KLOR-CON) 10 MEQ tablet Take 10 mEq by mouth 2 (two) times daily.     No current facility-administered medications for this visit.      PHYSICAL EXAM: Vitals:   04/27/17 1355 04/27/17 1402 04/27/17 1403  BP: (!) 150/79 (!) 150/79 136/76  Pulse: 94 94 94  Resp: 14    Temp: (!) 96.7 F (35.9 C)    TempSrc: Oral    SpO2: 96%    Weight: 141 lb (64 kg)    Height: 5' 6.5" (1.689 m)       GENERAL: The patient is a well-nourished male, in no acute distress. The vital signs are documented above. Left neck incision is well-healed with no bruits bilaterally.  Neurologically intact.  MEDICAL ISSUES: Stable following left carotid endarterectomy for asymptomatic disease.  Is status post right carotid endarterectomy in [place] approximately 6 years ago.  We will see him again in 6 months with a carotid duplex at that time.  He will notify should he develop any neuro deficits or wound issues   Rosetta Posner, MD [facility] Tel [phone] Pager [phone]

## 2017-04-28 ENCOUNTER — Other Ambulatory Visit: Payer: Self-pay

## 2017-04-28 DIAGNOSIS — I6522 Occlusion and stenosis of left carotid artery: Secondary | ICD-10-CM

## 2017-06-03 ENCOUNTER — Ambulatory Visit: Payer: Medicare HMO | Admitting: Cardiology

## 2017-06-03 ENCOUNTER — Encounter: Payer: Self-pay | Admitting: Cardiology

## 2017-06-03 VITALS — BP 126/80 | HR 67 | Ht 66.5 in | Wt 144.0 lb

## 2017-06-03 DIAGNOSIS — I739 Peripheral vascular disease, unspecified: Secondary | ICD-10-CM

## 2017-06-03 DIAGNOSIS — Z8673 Personal history of transient ischemic attack (TIA), and cerebral infarction without residual deficits: Secondary | ICD-10-CM

## 2017-06-03 DIAGNOSIS — E785 Hyperlipidemia, unspecified: Secondary | ICD-10-CM | POA: Diagnosis not present

## 2017-06-03 DIAGNOSIS — I2510 Atherosclerotic heart disease of native coronary artery without angina pectoris: Secondary | ICD-10-CM | POA: Diagnosis not present

## 2017-06-03 DIAGNOSIS — I10 Essential (primary) hypertension: Secondary | ICD-10-CM

## 2017-06-03 DIAGNOSIS — I639 Cerebral infarction, unspecified: Secondary | ICD-10-CM | POA: Diagnosis not present

## 2017-06-03 DIAGNOSIS — E038 Other specified hypothyroidism: Secondary | ICD-10-CM | POA: Diagnosis not present

## 2017-06-03 DIAGNOSIS — R7301 Impaired fasting glucose: Secondary | ICD-10-CM | POA: Diagnosis not present

## 2017-06-03 DIAGNOSIS — E782 Mixed hyperlipidemia: Secondary | ICD-10-CM | POA: Diagnosis not present

## 2017-06-03 DIAGNOSIS — I119 Hypertensive heart disease without heart failure: Secondary | ICD-10-CM | POA: Diagnosis not present

## 2017-06-03 NOTE — Progress Notes (Signed)
Cardiology Office Note:    Date:  06/03/2017   ID:  Earl Mueller, DOB 1935/09/20, MRN [id number]  PCP:  Rochel Brome, MD  Cardiologist:  Jenean Lindau, MD   Referring MD: Rochel Brome, MD    ASSESSMENT:    1. Stenosis of carotid artery, unspecified laterality   2. Coronary artery disease involving native coronary artery of native heart without angina pectoris   3. Essential hypertension   4. Peripheral vascular disease ([place])   5. Cerebrovascular accident (CVA), unspecified mechanism ([place])   6. Dyslipidemia   7. History of stroke    PLAN:    In order of problems listed above:  1.  Secondary prevention stressed with patient.  Importance of compliance with diet and medications stressed and he vocalized understanding his blood pressure stable. 2.  Diet was discussed for dyslipidemia.  Importance of regular medication compliance stressed he had blood work this morning at his primary care physician and he will send [place] a copy of this.  His peripheral vascular disease is stable. Patient will be seen in follow-up appointment in 6 months or earlier if the patient has any concerns    Medication Adjustments/Labs and Tests Ordered: Current medicines are reviewed at length with the patient today.  Concerns regarding medicines are outlined above.  No orders of the defined types were placed in this encounter.  No orders of the defined types were placed in this encounter.    Chief Complaint  Patient presents with  . Follow-up  . Coronary Artery Disease     History of Present Illness:    Earl Mueller is a 82 y.o. male patient has known coronary artery disease.  He underwent carotid endarterectomy.  Subsequently has done fine.  No chest pain orthopnea or PND.  He takes care of activities of daily living and walks on a regular basis.  At the time of my evaluation, the patient is alert awake oriented and in no distress.  Past Medical History:  Diagnosis Date  . Anxiety   .  Arthritis   . Cancer ([place])    skin  . Coronary artery disease    Dr. Geraldo Pitter [facility]  . Hypertension    dr Lennox Solders  . Hypothyroidism   . Peripheral vascular disease ([place])   . 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 BACK SURGERY    . CARDIAC CATHETERIZATION  03/06/10   NL-LM, 40%pLAD, 30% mid/distal LAD, 98% ostial DIAG (small), 30%pCx, 50%mCx, 30%dCx, 30% OM2, 40%dRCA; Med RX rec ([facility])  . caritid endart    . ENDARTERECTOMY Left 03/26/2017   Procedure: ENDARTERECTOMY CAROTID LEFT;  Surgeon: Rosetta Posner, MD;  [facility];  Service: Vascular;  Laterality: Left;  . femerao bypass    . LEFT HEART CATH AND CORONARY ANGIOGRAPHY N/A 02/03/2017   Procedure: LEFT HEART CATH AND CORONARY ANGIOGRAPHY;  Surgeon: Martinique, Peter M, MD;  Location: [place] CV LAB;  Service: Cardiovascular;  Laterality: N/A;  . PATCH ANGIOPLASTY Left 03/26/2017   Procedure: PATCH ANGIOPLASTY LEFT CAROTID ARTERY;  Surgeon: Rosetta Posner, MD;  Location: [place];  Service: Vascular;  Laterality: Left;  . SCALP LACERATION REPAIR N/A 05/23/2013   Procedure: EXCISION SCALP ULCER DEBRIDEMENT OF SKIN AND BONE WITH PLACEMENT OF A-CELL;  Surgeon: Irene Limbo, MD;  Location: [place];  Service: Plastics;  Laterality: N/A;  .  STOMACH SURGERY     x2  . VASCULAR SURGERY     AFBG 10/15/05    Current Medications: Current Meds  Medication Sig  . aspirin EC 81 MG tablet Take 81 mg by mouth daily.  Marland Kitchen aspirin-acetaminophen-caffeine (EXCEDRIN MIGRAINE) 250-250-65 MG tablet Take 2 tablets by mouth every 6 (six) hours as needed for headache.   Marland Kitchen atorvastatin (LIPITOR) 80 MG tablet Take 80 mg by mouth daily at 6 PM.   . chlorthalidone (HYGROTON) 25 MG tablet Take 25 mg by mouth daily.   . cilostazol (PLETAL) 100 MG tablet Take 100 mg by mouth 2 (two) times daily.  . cloNIDine (CATAPRES) 0.1 MG tablet Take 0.1 mg  by mouth 2 (two) times daily.  . clopidogrel (PLAVIX) 75 MG tablet Take 75 mg by mouth daily with breakfast.  . fenofibrate 160 MG tablet Take 160 mg by mouth daily.  Marland Kitchen levothyroxine (SYNTHROID, LEVOTHROID) 88 MCG tablet Take 88 mcg by mouth once daily before breakfast  . Multiple Vitamins-Minerals (CENTRUM SILVER PO) Take 1 tablet by mouth daily.  . nitroGLYCERIN (NITROSTAT) 0.4 MG SL tablet Place 0.4 mg under the tongue every 5 (five) minutes as needed for chest pain.  Marland Kitchen PARoxetine (PAXIL) 20 MG tablet Take 20 mg by mouth daily.  . potassium chloride (K-DUR,KLOR-CON) 10 MEQ tablet Take 10 mEq by mouth 2 (two) times daily.     Allergies:   Other   Social History   Socioeconomic History  . Marital status: Married    Spouse name: Not on file  . Number of children: Not on file  . Years of education: Not on file  . Highest education level: Not on file  Occupational History  . Not on file  Social Needs  . Financial resource strain: Not on file  . Food insecurity:    Worry: Not on file    Inability: Not on file  . Transportation needs:    Medical: Not on file    Non-medical: Not on file  Tobacco Use  . Smoking status: Former Smoker    Last attempt to quit: 12/18/2007    Years since quitting: 9.4  . Smokeless tobacco: Never Used  Substance and Sexual Activity  . Alcohol use: Yes    Comment: occ  . Drug use: No  . Sexual activity: Not on file  Lifestyle  . Physical activity:    Days per week: Not on file    Minutes per session: Not on file  . Stress: Not on file  Relationships  . Social connections:    Talks on phone: Not on file    Gets together: Not on file    Attends religious service: Not on file    Active member of club or organization: Not on file    Attends meetings of clubs or organizations: Not on file    Relationship status: Not on file  Other Topics Concern  . Not on file  Social History Narrative  . Not on file     Family History: The patient's family  history includes Heart failure in his father.  ROS:   Please see the history of present illness.    All other systems reviewed and are negative.  EKGs/Labs/Other Studies Reviewed:    The following studies were reviewed today: I discussed findings of the lab work done in the past with the patient this was done by his primary care physician.  He has had blood work today and going to send me that copy  today as soon as he receives it.  Secondary prevention stressed with the patient.  Importance of compliance with diet and medications stressed and he vocalized understanding.  His blood pressure stable.   Recent Labs: 01/29/2017: TSH 3.150 03/22/2017: ALT 24 03/26/2017: Magnesium 1.7 03/27/2017: BUN 15; Creatinine, Ser 1.07; Hemoglobin 11.4; Platelets 224; Potassium 3.3; Sodium 135  Recent Lipid Panel No results found for: CHOL, TRIG, HDL, CHOLHDL, VLDL, LDLCALC, LDLDIRECT  Physical Exam:    VS:  BP 126/80 (BP Location: Right Arm, Patient Position: Sitting, Cuff Size: Normal)   Pulse 67   Ht 5' 6.5" (1.689 m)   Wt 144 lb (65.3 kg)   SpO2 97%   BMI 22.89 kg/m     Wt Readings from Last 3 Encounters:  06/03/17 144 lb (65.3 kg)  04/27/17 141 lb (64 kg)  03/27/17 149 lb 11.1 oz (67.9 kg)     GEN: Patient is in no acute distress HEENT: Normal NECK: No JVD; No carotid bruits LYMPHATICS: No lymphadenopathy CARDIAC: Hear sounds regular, 2/6 systolic murmur at the apex. RESPIRATORY:  Clear to auscultation without rales, wheezing or rhonchi  ABDOMEN: Soft, non-tender, non-distended MUSCULOSKELETAL:  No edema; No deformity  SKIN: Warm and dry NEUROLOGIC:  Alert and oriented x 3 PSYCHIATRIC:  Normal affect   Signed, Jenean Lindau, MD  06/03/2017 11:04 AM    [place]

## 2017-06-03 NOTE — Patient Instructions (Signed)
Medication Instructions:  Your physician recommends that you continue on your current medications as directed. Please refer to the Current Medication list given to you today.  Labwork Please have Primary care doctor  fax [place] your labs [phone]  Testing/Procedures: none  Follow-Up: Your physician recommends that you schedule a follow-up appointment in: 6 months   Any Other Special Instructions Will Be Listed Below (If Applicable).     If you need a refill on your cardiac medications before your next appointment, please call your pharmacy.

## 2017-06-07 DIAGNOSIS — E782 Mixed hyperlipidemia: Secondary | ICD-10-CM | POA: Diagnosis not present

## 2017-06-07 DIAGNOSIS — E038 Other specified hypothyroidism: Secondary | ICD-10-CM | POA: Diagnosis not present

## 2017-06-07 DIAGNOSIS — C4482 Squamous cell carcinoma of overlapping sites of skin: Secondary | ICD-10-CM | POA: Diagnosis not present

## 2017-06-07 DIAGNOSIS — I119 Hypertensive heart disease without heart failure: Secondary | ICD-10-CM | POA: Diagnosis not present

## 2017-06-07 DIAGNOSIS — R7301 Impaired fasting glucose: Secondary | ICD-10-CM | POA: Diagnosis not present

## 2017-06-21 DIAGNOSIS — C4442 Squamous cell carcinoma of skin of scalp and neck: Secondary | ICD-10-CM | POA: Diagnosis not present

## 2017-06-21 DIAGNOSIS — L219 Seborrheic dermatitis, unspecified: Secondary | ICD-10-CM | POA: Diagnosis not present

## 2017-06-23 DIAGNOSIS — H6123 Impacted cerumen, bilateral: Secondary | ICD-10-CM | POA: Diagnosis not present

## 2017-07-20 DIAGNOSIS — L570 Actinic keratosis: Secondary | ICD-10-CM | POA: Diagnosis not present

## 2017-07-20 DIAGNOSIS — L219 Seborrheic dermatitis, unspecified: Secondary | ICD-10-CM | POA: Diagnosis not present

## 2017-07-29 DIAGNOSIS — Z0000 Encounter for general adult medical examination without abnormal findings: Secondary | ICD-10-CM | POA: Diagnosis not present

## 2017-07-29 DIAGNOSIS — Z122 Encounter for screening for malignant neoplasm of respiratory organs: Secondary | ICD-10-CM | POA: Diagnosis not present

## 2017-07-29 DIAGNOSIS — Z6820 Body mass index (BMI) 20.0-20.9, adult: Secondary | ICD-10-CM | POA: Diagnosis not present

## 2017-08-04 DIAGNOSIS — J439 Emphysema, unspecified: Secondary | ICD-10-CM | POA: Diagnosis not present

## 2017-08-04 DIAGNOSIS — Z122 Encounter for screening for malignant neoplasm of respiratory organs: Secondary | ICD-10-CM | POA: Diagnosis not present

## 2017-08-04 DIAGNOSIS — I700 Atherosclerosis of aorta: Secondary | ICD-10-CM | POA: Diagnosis not present

## 2017-08-04 DIAGNOSIS — Z87891 Personal history of nicotine dependence: Secondary | ICD-10-CM | POA: Diagnosis not present

## 2017-08-04 DIAGNOSIS — R918 Other nonspecific abnormal finding of lung field: Secondary | ICD-10-CM | POA: Diagnosis not present

## 2017-08-04 DIAGNOSIS — I2510 Atherosclerotic heart disease of native coronary artery without angina pectoris: Secondary | ICD-10-CM | POA: Diagnosis not present

## 2017-08-13 DIAGNOSIS — I2510 Atherosclerotic heart disease of native coronary artery without angina pectoris: Secondary | ICD-10-CM | POA: Diagnosis not present

## 2017-08-13 DIAGNOSIS — J8489 Other specified interstitial pulmonary diseases: Secondary | ICD-10-CM | POA: Diagnosis not present

## 2017-11-02 ENCOUNTER — Encounter: Payer: Self-pay | Admitting: Vascular Surgery

## 2017-11-02 ENCOUNTER — Ambulatory Visit (INDEPENDENT_AMBULATORY_CARE_PROVIDER_SITE_OTHER): Payer: Medicare HMO | Admitting: Vascular Surgery

## 2017-11-02 ENCOUNTER — Ambulatory Visit (HOSPITAL_COMMUNITY)
Admission: RE | Admit: 2017-11-02 | Discharge: 2017-11-02 | Disposition: A | Discharge status: Home / Self Care | Payer: Medicare HMO | Source: Ambulatory Visit | Attending: Vascular Surgery | Admitting: Vascular Surgery

## 2017-11-02 ENCOUNTER — Other Ambulatory Visit: Payer: Self-pay

## 2017-11-02 VITALS — BP 139/76 | HR 79 | Temp 97.5°F | Resp 16 | Ht 70.0 in | Wt 142.0 lb

## 2017-11-02 DIAGNOSIS — Z48812 Encounter for surgical aftercare following surgery on the circulatory system: Secondary | ICD-10-CM | POA: Insufficient documentation

## 2017-11-02 DIAGNOSIS — I10 Essential (primary) hypertension: Secondary | ICD-10-CM | POA: Diagnosis not present

## 2017-11-02 DIAGNOSIS — I6522 Occlusion and stenosis of left carotid artery: Secondary | ICD-10-CM

## 2017-11-02 DIAGNOSIS — Z87891 Personal history of nicotine dependence: Secondary | ICD-10-CM | POA: Insufficient documentation

## 2017-11-02 DIAGNOSIS — Z8673 Personal history of transient ischemic attack (TIA), and cerebral infarction without residual deficits: Secondary | ICD-10-CM | POA: Insufficient documentation

## 2017-11-02 NOTE — Progress Notes (Signed)
Vitals:   11/02/17 1355 11/02/17 1358  BP: 124/78 (!) 142/79  Pulse: 79 79  Resp: 16   Temp: (!) 97.5 F (36.4 C)   TempSrc: Oral   SpO2: 94%   Weight: 142 lb (64.4 kg)   Height: 5\' 10"  (1.778 m)

## 2017-11-02 NOTE — Progress Notes (Signed)
V[facility] and Vein Specialist of [place]  Patient name: Earl Mueller MRN: [id number] DOB: 04/16/1935 Sex: male  REASON FOR VISIT: [place] left carotid endarterectomy on 03/16/2017  HPI: Earl Mueller is a 82 y.o. male here today for follow-up.  Underwent left carotid endarterectomy for severe asymptomatic disease January 2019.  He is done well and has had no new neurologic deficits.  He did have a prior history of remote right carotid endarterectomy proximally for 5 years ago in [place].  He has no new neurologic deficits.  He has no new cardiac difficulty.  Past Medical History:  Diagnosis Date  . Anxiety   . Arthritis   . Cancer ([place])    skin  . Coronary artery disease    Dr. Geraldo Pitter with [facility]  . Hypertension    dr Lennox Solders  . Hypothyroidism   . Peripheral vascular disease ([place])   . 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 Heart failure Father     SOCIAL HISTORY: Social History   Tobacco Use  . Smoking status: Former Smoker    Last attempt to quit: 12/18/2007    Years since quitting: 9.8  . Smokeless tobacco: Never Used  Substance Use Topics  . Alcohol use: Yes    Comment: occ    Allergies  Allergen Reactions  . Other Other (See Comments)    Other reaction(s): Delusions (intolerance) ALL NARCOTICS- Any narcotic makes him a "wild man" ALL NARCOTICS-    Current Outpatient Medications  Medication Sig Dispense Refill  . aspirin EC 81 MG tablet Take 81 mg by mouth daily.    Marland Kitchen aspirin-acetaminophen-caffeine (EXCEDRIN MIGRAINE) 250-250-65 MG tablet Take 2 tablets by mouth every 6 (six) hours as needed for headache.     Marland Kitchen atorvastatin (LIPITOR) 80 MG tablet Take 80 mg by mouth daily at 6 PM.     . chlorthalidone (HYGROTON) 25 MG tablet Take 25 mg by mouth daily.     . cilostazol (PLETAL) 100 MG tablet  Take 100 mg by mouth 2 (two) times daily.    . cloNIDine (CATAPRES) 0.1 MG tablet Take 0.1 mg by mouth 2 (two) times daily.    . clopidogrel (PLAVIX) 75 MG tablet Take 75 mg by mouth daily with breakfast.    . fenofibrate 160 MG tablet Take 160 mg by mouth daily.    Marland Kitchen levothyroxine (SYNTHROID, LEVOTHROID) 88 MCG tablet Take 88 mcg by mouth once daily before breakfast  1  . Multiple Vitamins-Minerals (CENTRUM SILVER PO) Take 1 tablet by mouth daily.    . nitroGLYCERIN (NITROSTAT) 0.4 MG SL tablet Place 0.4 mg under the tongue every 5 (five) minutes as needed for chest pain.    Marland Kitchen PARoxetine (PAXIL) 20 MG tablet Take 20 mg by mouth daily.    . potassium chloride (K-DUR,KLOR-CON) 10 MEQ tablet Take 10 mEq by mouth 2 (two) times daily.     No current facility-administered medications for this visit.     REVIEW OF SYSTEMS:  [X]  denotes positive finding, [ ]  denotes negative finding Cardiac  Comments:  Chest pain or chest pressure:    Shortness of breath upon exertion:    Short of breath when lying flat:    Irregular heart rhythm:        Vascular    Pain in calf, thigh, or hip brought on  by ambulation:    Pain in feet at night that wakes you up from your sleep:     Blood clot in your veins:    Leg swelling:           PHYSICAL EXAM: Vitals:   11/02/17 1355 11/02/17 1358 11/02/17 1359  BP: 124/78 (!) 142/79 139/76  Pulse: 79 79 79  Resp: 16    Temp: (!) 97.5 F (36.4 C)    TempSrc: Oral    SpO2: 94%    Weight: 142 lb (64.4 kg)    Height: 5\' 10"  (1.778 m)      GENERAL: The patient is a well-nourished male, in no acute distress. The vital signs are documented above. CARDIOVASCULAR: He has well-healed carotid incisions bilaterally.  He does not have carotid bruits. PULMONARY: There is good air exchange  MUSCULOSKELETAL: There are no major deformities or cyanosis. NEUROLOGIC: No focal weakness or paresthesias are detected. SKIN: There are no ulcers or rashes noted. PSYCHIATRIC:  The patient has a normal affect.  DATA:  His carotid duplex today was reviewed with the patient and his wife present.  This reveals widely patent endarterectomy sites bilaterally with no evidence of recurrent stenosis  MEDICAL ISSUES: Stable status post left carotid endarterectomy in January 2019 and remote right carotid endarterectomy in [place].  No evidence of significant carotid stenosis.  He will continue full activity.  We will see him again in 1 year for continued duplex surveillance follow-up    Rosetta Posner, MD [facility] Vascular and Vein Specialists of [facility] Tel [phone] Pager [phone]

## 2017-12-02 DIAGNOSIS — E038 Other specified hypothyroidism: Secondary | ICD-10-CM | POA: Diagnosis not present

## 2017-12-02 DIAGNOSIS — E782 Mixed hyperlipidemia: Secondary | ICD-10-CM | POA: Diagnosis not present

## 2017-12-02 DIAGNOSIS — R7301 Impaired fasting glucose: Secondary | ICD-10-CM | POA: Diagnosis not present

## 2017-12-02 DIAGNOSIS — I119 Hypertensive heart disease without heart failure: Secondary | ICD-10-CM | POA: Diagnosis not present

## 2017-12-06 DIAGNOSIS — I119 Hypertensive heart disease without heart failure: Secondary | ICD-10-CM | POA: Diagnosis not present

## 2017-12-06 DIAGNOSIS — E782 Mixed hyperlipidemia: Secondary | ICD-10-CM | POA: Diagnosis not present

## 2017-12-06 DIAGNOSIS — E876 Hypokalemia: Secondary | ICD-10-CM | POA: Diagnosis not present

## 2017-12-06 DIAGNOSIS — R7301 Impaired fasting glucose: Secondary | ICD-10-CM | POA: Diagnosis not present

## 2017-12-06 DIAGNOSIS — E038 Other specified hypothyroidism: Secondary | ICD-10-CM | POA: Diagnosis not present

## 2017-12-06 DIAGNOSIS — F320 Major depressive disorder, single episode, mild: Secondary | ICD-10-CM | POA: Diagnosis not present

## 2017-12-08 DIAGNOSIS — H52223 Regular astigmatism, bilateral: Secondary | ICD-10-CM | POA: Diagnosis not present

## 2017-12-09 ENCOUNTER — Encounter: Payer: Self-pay | Admitting: Cardiology

## 2017-12-09 ENCOUNTER — Ambulatory Visit: Payer: Medicare HMO | Admitting: Cardiology

## 2017-12-09 VITALS — BP 120/70 | HR 82 | Ht 70.0 in | Wt 143.0 lb

## 2017-12-09 DIAGNOSIS — I639 Cerebral infarction, unspecified: Secondary | ICD-10-CM

## 2017-12-09 DIAGNOSIS — Z8673 Personal history of transient ischemic attack (TIA), and cerebral infarction without residual deficits: Secondary | ICD-10-CM | POA: Diagnosis not present

## 2017-12-09 DIAGNOSIS — I10 Essential (primary) hypertension: Secondary | ICD-10-CM | POA: Diagnosis not present

## 2017-12-09 DIAGNOSIS — I739 Peripheral vascular disease, unspecified: Secondary | ICD-10-CM | POA: Diagnosis not present

## 2017-12-09 DIAGNOSIS — I2510 Atherosclerotic heart disease of native coronary artery without angina pectoris: Secondary | ICD-10-CM

## 2017-12-09 NOTE — Progress Notes (Signed)
Cardiology Office Note:    Date:  12/09/2017   ID:  Earl Mueller, DOB May 08, 1935, MRN [id number]  PCP:  Earl Brome, MD  Cardiologist:  Earl Lindau, MD   Referring MD: Earl Brome, MD    ASSESSMENT:    1. Coronary artery disease involving native coronary artery of native heart without angina pectoris   2. Essential hypertension   3. Peripheral vascular disease ([place])   4. Cerebrovascular accident (CVA), unspecified mechanism ([place])   5. History of stroke    PLAN:    In order of problems listed above:  1. Secondary prevention stressed with the patient.  Importance of compliance with diet and medication stressed and he vocalized understanding.  His blood pressure is stable.  Diet was discussed for dyslipidemia. 2. Importance of regular exercise stressed and he vocalized understanding. 3. Patient will be seen in follow-up appointment in 6 months or earlier if the patient has any concerns    Medication Adjustments/Labs and Tests Ordered: Current medicines are reviewed at length with the patient today.  Concerns regarding medicines are outlined above.  No orders of the defined types were placed in this encounter.  No orders of the defined types were placed in this encounter.    Chief Complaint  Patient presents with  . Follow-up     History of Present Illness:    Earl Mueller is a 82 y.o. male.  Patient has known coronary artery disease he denies any problems at this time and takes care of activities of daily living.  No chest pain orthopnea or PND.  He is an active gentleman and exercises and walks on a regular basis.  He has had a history of stroke but he is pushing himself very well.  His wife accompanies him for this visit.  Past Medical History:  Diagnosis Date  . Anxiety   . Arthritis   . Cancer ([place])    skin  . Coronary artery disease    Dr. Geraldo Mueller with [facility]  . Hypertension    dr Earl Mueller  .  Hypothyroidism   . Peripheral vascular disease ([place])   . 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 BACK SURGERY    . CARDIAC CATHETERIZATION  03/06/10   NL-LM, 40%pLAD, 30% mid/distal LAD, 98% ostial DIAG (small), 30%pCx, 50%mCx, 30%dCx, 30% OM2, 40%dRCA; Med RX rec ([facility])  . caritid endart    . ENDARTERECTOMY Left 03/26/2017   Procedure: ENDARTERECTOMY CAROTID LEFT;  Surgeon: Earl Posner, MD;  Location: [facility] OR;  Service: Vascular;  Laterality: Left;  . femerao bypass    . LEFT HEART CATH AND CORONARY ANGIOGRAPHY N/A 02/03/2017   Procedure: LEFT HEART CATH AND CORONARY ANGIOGRAPHY;  Surgeon: Martinique, Peter M, MD;  Location: [place] CV LAB;  Service: Cardiovascular;  Laterality: N/A;  . PATCH ANGIOPLASTY Left 03/26/2017   Procedure: PATCH ANGIOPLASTY LEFT CAROTID ARTERY;  Surgeon: Earl Posner, MD;  Location: [place];  Service: Vascular;  Laterality: Left;  . SCALP LACERATION REPAIR N/A 05/23/2013   Procedure: EXCISION SCALP ULCER DEBRIDEMENT OF SKIN AND BONE WITH PLACEMENT OF A-CELL;  Surgeon: Earl Limbo, MD;  Location: [place];  Service: Plastics;  Laterality: N/A;  . STOMACH SURGERY     x2  . VASCULAR SURGERY     AFBG 10/15/05    Current Medications: No outpatient medications have been marked as  taking for the 12/09/17 encounter (Office Visit) with Revankar, Reita Cliche, MD.     Allergies:   Other   Social History   Socioeconomic History  . Marital status: Married    Spouse name: Not on file  . Number of children: Not on file  . Years of education: Not on file  . Highest education level: Not on file  Occupational History  . Not on file  Social Needs  . Financial resource strain: Not on file  . Food insecurity:    Worry: Not on file    Inability: Not on file  . Transportation needs:    Medical: Not on file    Non-medical: Not on file  Tobacco Use  . Smoking status: Former Smoker    Last attempt to  quit: 12/18/2007    Years since quitting: 9.9  . Smokeless tobacco: Never Used  Substance and Sexual Activity  . Alcohol use: Yes    Comment: occ  . Drug use: No  . Sexual activity: Not on file  Lifestyle  . Physical activity:    Days per week: Not on file    Minutes per session: Not on file  . Stress: Not on file  Relationships  . Social connections:    Talks on phone: Not on file    Gets together: Not on file    Attends religious service: Not on file    Active member of club or organization: Not on file    Attends meetings of clubs or organizations: Not on file    Relationship status: Not on file  Other Topics Concern  . Not on file  Social History Narrative  . Not on file     Family History: The patient's family history includes Heart failure in his father.  ROS:   Please see the history of present illness.    All other systems reviewed and are negative.  EKGs/Labs/Other Studies Reviewed:    The following studies were reviewed today: I discussed my findings with the patient at length he has brought lab work which is excellent as far as cardiovascular evaluation is concerned   Recent Labs: 01/29/2017: TSH 3.150 03/22/2017: ALT 24 03/26/2017: Magnesium 1.7 03/27/2017: BUN 15; Creatinine, Ser 1.07; Hemoglobin 11.4; Platelets 224; Potassium 3.3; Sodium 135  Recent Lipid Panel No results found for: CHOL, TRIG, HDL, CHOLHDL, VLDL, LDLCALC, LDLDIRECT  Physical Exam:    VS:  BP 120/70 (BP Location: Left Arm, Patient Position: Sitting, Cuff Size: Normal)   Pulse 82   Ht 5\' 10"  (1.778 m)   Wt 143 lb (64.9 kg)   SpO2 97%   BMI 20.52 kg/m     Wt Readings from Last 3 Encounters:  12/09/17 143 lb (64.9 kg)  11/02/17 142 lb (64.4 kg)  06/03/17 144 lb (65.3 kg)     GEN: Patient is in no acute distress HEENT: Normal NECK: No JVD; No carotid bruits LYMPHATICS: No lymphadenopathy CARDIAC: Hear sounds regular, 2/6 systolic murmur at the apex. RESPIRATORY:  Clear to  auscultation without rales, wheezing or rhonchi  ABDOMEN: Soft, non-tender, non-distended MUSCULOSKELETAL:  No edema; No deformity  SKIN: Warm and dry NEUROLOGIC:  Alert and oriented x 3 PSYCHIATRIC:  Normal affect   Signed, Earl Lindau, MD  12/09/2017 10:34 AM    [place]

## 2017-12-09 NOTE — Patient Instructions (Signed)

## 2017-12-13 DIAGNOSIS — Z23 Encounter for immunization: Secondary | ICD-10-CM | POA: Diagnosis not present

## 2018-05-23 DIAGNOSIS — I119 Hypertensive heart disease without heart failure: Secondary | ICD-10-CM | POA: Diagnosis not present

## 2018-05-23 DIAGNOSIS — E782 Mixed hyperlipidemia: Secondary | ICD-10-CM | POA: Diagnosis not present

## 2018-05-23 DIAGNOSIS — R7301 Impaired fasting glucose: Secondary | ICD-10-CM | POA: Diagnosis not present

## 2018-06-07 ENCOUNTER — Encounter: Payer: Self-pay | Admitting: Cardiology

## 2018-06-07 DIAGNOSIS — R7301 Impaired fasting glucose: Secondary | ICD-10-CM | POA: Diagnosis not present

## 2018-06-07 DIAGNOSIS — I119 Hypertensive heart disease without heart failure: Secondary | ICD-10-CM | POA: Diagnosis not present

## 2018-06-07 DIAGNOSIS — E876 Hypokalemia: Secondary | ICD-10-CM | POA: Diagnosis not present

## 2018-06-07 DIAGNOSIS — F320 Major depressive disorder, single episode, mild: Secondary | ICD-10-CM | POA: Diagnosis not present

## 2018-06-07 DIAGNOSIS — E782 Mixed hyperlipidemia: Secondary | ICD-10-CM | POA: Diagnosis not present

## 2018-06-07 DIAGNOSIS — E038 Other specified hypothyroidism: Secondary | ICD-10-CM | POA: Diagnosis not present

## 2018-06-10 ENCOUNTER — Other Ambulatory Visit: Payer: Self-pay

## 2018-06-10 ENCOUNTER — Encounter: Payer: Self-pay | Admitting: Cardiology

## 2018-06-10 ENCOUNTER — Telehealth: Payer: Self-pay | Admitting: Cardiology

## 2018-06-10 ENCOUNTER — Ambulatory Visit (INDEPENDENT_AMBULATORY_CARE_PROVIDER_SITE_OTHER): Payer: Medicare HMO | Admitting: Cardiology

## 2018-06-10 VITALS — BP 127/79 | HR 84 | Ht 70.0 in | Wt 136.2 lb

## 2018-06-10 DIAGNOSIS — I10 Essential (primary) hypertension: Secondary | ICD-10-CM | POA: Diagnosis not present

## 2018-06-10 DIAGNOSIS — I2510 Atherosclerotic heart disease of native coronary artery without angina pectoris: Secondary | ICD-10-CM

## 2018-06-10 DIAGNOSIS — Z8673 Personal history of transient ischemic attack (TIA), and cerebral infarction without residual deficits: Secondary | ICD-10-CM | POA: Diagnosis not present

## 2018-06-10 DIAGNOSIS — E785 Hyperlipidemia, unspecified: Secondary | ICD-10-CM

## 2018-06-10 NOTE — Telephone Encounter (Signed)
YOUR CARDIOLOGY TEAM HAS ARRANGED FOR AN E-VISIT FOR YOUR APPOINTMENT - PLEASE REVIEW IMPORTANT INFORMATION BELOW SEVERAL DAYS PRIOR TO YOUR APPOINTMENT  Due to the recent COVID-19 pandemic, we are transitioning in-person office visits to tele-medicine visits in an effort to decrease unnecessary exposure to our patients and staff. Medicare and most insurances are covering these visits without a copay needed. You will need a working email and a smartphone or computer with a camera and microphone. For patients that do not have these items, we can still complete the visit using a telephone but do prefer video when possible. If possible, we also ask that you have a blood pressure cuff and scale at home to measure your blood pressure, heart rate and weight prior to your scheduled appointment. Patients with clinical needs that need an in-person evaluation and testing will still be able to come to the office if absolutely necessary. If you have any questions, feel free to call our office.     DOWNLOADING THE SOFTWARE  Download the [employer] app to enable video and telephone visits with your [facility] Provider.   Instructions for downloading [employer] WebEx: - Go to https://www.webex.com/downloads.html and follow the instructions, or download the app on your smartphone [facility] [employer] Meetings). - If you have technical difficulties with downloading WebEx, please call WebEx at [phone]. - Once the app is downloaded (can be done on either mobile or desktop computer), go to Settings in the upper left hand corner.  Be sure that camera and audio are enabled.  - You will receive an email message with a link to the meeting with a time to join for your tele-health visit.  - Please download the app and have settings configured prior to the appointment time.      2-3 DAYS BEFORE YOUR APPOINTMENT  One of our staff will call you to confirm that you have been able to set up your WebEx account. We will remind  you check your blood pressure, heart rate and weight prior to your scheduled appointment. If you have an Apple Watch or Kardia, please upload any pertinent ECG strips the day before or morning of your appointment to [place]. Our staff will also make sure you have reviewed the consent and agree to move forward with your scheduled tele-health visit.    THE DAY OF YOUR APPOINTMENT  Approximately 15-20 minutes prior to your scheduled appointment, you will receive an e-mail directly from one of our staff member's [email or web address] e-mail accounts inviting you to join a WebEx meeting.  Please do not reply to that email - simply join the [employer].  Upon joining, a member of the office staff will speak with you initially through the WebEx platform to confirm medications, vital signs for the day and any symptoms you may be experiencing.  Please have this information available prior to the time of visit start.      CONSENT FOR TELE-HEALTH VISIT - PLEASE RVIEW  I hereby voluntarily request, consent and authorize [facility] and its employed or contracted physicians, physician assistants, nurse practitioners or other licensed health care professionals (the Practitioner), to provide me with telemedicine health care services (the Services") as deemed necessary by the treating Practitioner. I acknowledge and consent to receive the Services by the Practitioner via telemedicine. I understand that the telemedicine visit will involve communicating with the Practitioner through live audiovisual communication technology and the disclosure of certain medical information by electronic transmission. I acknowledge that I have been given the opportunity to  request an in-person assessment or other available alternative prior to the telemedicine visit and am voluntarily participating in the telemedicine visit.  I understand that I have the right to withhold or withdraw my consent to the use of telemedicine in the course of  my care at any time, without affecting my right to future care or treatment, and that the Practitioner or I may terminate the telemedicine visit at any time. I understand that I have the right to inspect all information obtained and/or recorded in the course of the telemedicine visit and may receive copies of available information for a reasonable fee.  I understand that some of the potential risks of receiving the Services via telemedicine include:   Delay or interruption in medical evaluation due to technological equipment failure or disruption;  Information transmitted may not be sufficient (e.g. poor resolution of images) to allow for appropriate medical decision making by the Practitioner; and/or   In rare instances, security protocols could fail, causing a breach of personal health information.  Furthermore, I acknowledge that it is my responsibility to provide information about my medical history, conditions and care that is complete and accurate to the best of my ability. I acknowledge that Practitioner's advice, recommendations, and/or decision may be based on factors not within their control, such as incomplete or inaccurate data provided by me or distortions of diagnostic images or specimens that may result from electronic transmissions. I understand that the practice of medicine is not an exact science and that Practitioner makes no warranties or guarantees regarding treatment outcomes. I acknowledge that I will receive a copy of this consent concurrently upon execution via email to the email address I last provided but may also request a printed copy by calling the office of [place].    I understand that my insurance will be billed for this visit.   I have read or had this consent read to me.  I understand the contents of this consent, which adequately explains the benefits and risks of the Services being provided via telemedicine.   I have been provided ample opportunity to ask  questions regarding this consent and the Services and have had my questions answered to my satisfaction.  I give my informed consent for the services to be provided through the use of telemedicine in my medical care  By participating in this telemedicine visit I agree to the above.  Pt agreed to consent  Verbally. Earl Mueller

## 2018-06-10 NOTE — Patient Instructions (Signed)
Medication Instructions:  Your physician recommends that you continue on your current medications as directed. Please refer to the Current Medication list given to you today.  If you need a refill on your cardiac medications before your next appointment, please call your pharmacy.   Lab work: PLEASE ask Dr.Cox office to forward copy of June 2020 lab results to [place] when they are available at [phone].  If you have labs (blood work) drawn today and your tests are completely normal, you will receive your results only by: Marland Kitchen MyChart Message (if you have MyChart) OR . A paper copy in the mail If you have any lab test that is abnormal or we need to change your treatment, we will call you to review the results.  Testing/Procedures: NONE  Follow-Up: At [facility], you and your health needs are our priority.  As part of our continuing mission to provide you with exceptional heart care, we have created designated Provider Care Teams.  These Care Teams include your primary Cardiologist (physician) and Advanced Practice Providers (APPs -  Physician Assistants and Nurse Practitioners) who all work together to provide you with the care you need, when you need it. You will need a follow up appointment in 6 months.

## 2018-06-10 NOTE — Progress Notes (Signed)
Virtual Visit via Video Note    Evaluation Performed:  Follow-up visit  This visit type was conducted due to national recommendations for restrictions regarding the COVID-19 Pandemic (e.g. social distancing).  This format is felt to be most appropriate for this patient at this time.  All issues noted in this document were discussed and addressed.  No physical exam was performed (except for noted visual exam findings with Video Visits).  Please refer to the patient's chart (MyChart message for video visits and phone note for telephone visits) for the patient's consent to telehealth for [facility].  Date:  06/10/2018   ID:  Earl Mueller, DOB 01-31-1936, MRN [id number] + Patient Location:  [place] [address]   Provider location:   [place]  PCP:  Rochel Brome, MD  Cardiologist:  No primary care provider on file. Electrophysiologist:  None   Chief Complaint: Coronary artery disease  History of Present Illness:    Earl Mueller is a 83 y.o. male who presents via audio/video conferencing for a telehealth visit today.  This was on during video conference.  Patient has known coronary artery disease.  He denies any problems at this time and takes care of activities of daily living.  No chest pain orthopnea or PND.  He tells me that he ambulates regularly without any issues.  Recently his primary care doctor did blood work and I reviewed his anxiety discussed issues with the patient.  Patient's potassium was found to be low and replaced by primary care doctor.   The patient does not symptoms concerning for COVID-19 infection (fever, chills, cough, or new SHORTNESS OF BREATH).    Prior CV studies:   The following studies were reviewed today:  Review of systems was unremarkable.  Past Medical History:  Diagnosis Date  . Anxiety   . Arthritis   . Cancer ([place])    skin  . Coronary artery disease    Dr. Geraldo Pitter with [facility]  .  Hypertension    dr Lennox Solders  . Hypothyroidism   . Peripheral vascular disease ([place])   . 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 BACK SURGERY    . CARDIAC CATHETERIZATION  03/06/10   NL-LM, 40%pLAD, 30% mid/distal LAD, 98% ostial DIAG (small), 30%pCx, 50%mCx, 30%dCx, 30% OM2, 40%dRCA; Med RX rec ([facility])  . caritid endart    . ENDARTERECTOMY Left 03/26/2017   Procedure: ENDARTERECTOMY CAROTID LEFT;  Surgeon: Rosetta Posner, MD;  Location: [facility] OR;  Service: Vascular;  Laterality: Left;  . femerao bypass    . LEFT HEART CATH AND CORONARY ANGIOGRAPHY N/A 02/03/2017   Procedure: LEFT HEART CATH AND CORONARY ANGIOGRAPHY;  Surgeon: Martinique, Peter M, MD;  Location: [place] CV LAB;  Service: Cardiovascular;  Laterality: N/A;  . PATCH ANGIOPLASTY Left 03/26/2017   Procedure: PATCH ANGIOPLASTY LEFT CAROTID ARTERY;  Surgeon: Rosetta Posner, MD;  Location: [place];  Service: Vascular;  Laterality: Left;  . SCALP LACERATION REPAIR N/A 05/23/2013   Procedure: EXCISION SCALP ULCER DEBRIDEMENT OF SKIN AND BONE WITH PLACEMENT OF A-CELL;  Surgeon: Irene Limbo, MD;  Location: [place];  Service: Plastics;  Laterality: N/A;  . STOMACH SURGERY     x2  . VASCULAR SURGERY     AFBG 10/15/05     Current Meds  Medication Sig  . Acetaminophen 500 MG coapsule Take by  mouth every 4 (four) hours as needed.   Marland Kitchen aspirin EC 81 MG tablet Take 81 mg by mouth daily.  Marland Kitchen atorvastatin (LIPITOR) 80 MG tablet Take 80 mg by mouth daily at 6 PM.   . chlorthalidone (HYGROTON) 25 MG tablet Take 25 mg by mouth daily.   . cilostazol (PLETAL) 100 MG tablet Take 100 mg by mouth 2 (two) times daily.  . cloNIDine (CATAPRES) 0.1 MG tablet Take 0.1 mg by mouth 2 (two) times daily.  . clopidogrel (PLAVIX) 75 MG tablet Take 75 mg by mouth daily with breakfast.  . fenofibrate 160 MG tablet Take 160 mg by mouth daily.  Marland Kitchen levothyroxine (SYNTHROID, LEVOTHROID) 88 MCG  tablet Take 88 mcg by mouth once daily before breakfast  . Multiple Vitamins-Minerals (CENTRUM SILVER PO) Take 1 tablet by mouth daily.  . nitroGLYCERIN (NITROSTAT) 0.4 MG SL tablet Place 0.4 mg under the tongue every 5 (five) minutes as needed for chest pain.  Marland Kitchen PARoxetine (PAXIL) 20 MG tablet Take 20 mg by mouth daily.  . potassium chloride (K-DUR,KLOR-CON) 10 MEQ tablet Take 10 mEq by mouth 3 (three) times daily.      Allergies:   Other   Social History   Tobacco Use  . Smoking status: Former Smoker    Last attempt to quit: 12/18/2007    Years since quitting: 10.4  . Smokeless tobacco: Never Used  Substance Use Topics  . Alcohol use: Yes    Comment: occ  . Drug use: No     Family Hx: The patient's family history includes Heart failure in his father.  ROS:   Please see the history of present illness.    Review of systems was unremarkable. All other systems reviewed and are negative.   Labs/Other Tests and Data Reviewed:    Recent Labs: No results found for requested labs within last 8760 hours.   Recent Lipid Panel No results found for: CHOL, TRIG, HDL, CHOLHDL, LDLCALC, LDLDIRECT  Wt Readings from Last 3 Encounters:  06/10/18 136 lb 3.2 oz (61.8 kg)  12/09/17 143 lb (64.9 kg)  11/02/17 142 lb (64.4 kg)     Exam:    Vital Signs:  BP 127/79 (BP Location: Left Arm, Patient Position: Sitting, Cuff Size: Normal)   Pulse 84   Ht 5\' 10"  (1.778 m)   Wt 136 lb 3.2 oz (61.8 kg)   BMI 19.54 kg/m    Well nourished, well developed male in no acute distress. Patient appeared comfortable.  No respiratory distress.  No shortness of breath during conversation of this meeting.  He tells me that he was walking fine regularly.  ASSESSMENT & PLAN:    1.  Coronary artery disease and stable angina pectoris.  Secondary prevention stressed with the patient.  Importance of compliance with diet and medication stressed and he vocalized understanding.  His blood pressure is stable.   Diet was discussed for dyslipidemia.  Importance of regular exercise stressed and he is walking on a regular basis. 2.  His blood pressure is stable.  3.  Hypokalemia: His potassium was low.  I told him to see his primary care doctor in the next 2 to 3 weeks for a follow-up potassium check and he is agreeable. 4.  Patient will be seen in follow-up appointment in 6 months or earlier if the patient has any concerns   COVID-19 Education: The signs and symptoms of COVID-19 were discussed with the patient and how to seek care for testing (follow up with  PCP or arrange E-visit).  The importance of social distancing was discussed today.  Patient Risk:   After full review of this patients clinical status, I feel that they are at least moderate risk at this time.  Time:   Today, I have spent 8 low minutes with the patient with telehealth technology discussing.     Medication Adjustments/Labs and Tests Ordered: Current medicines are reviewed at length with the patient today.  Concerns regarding medicines are outlined above.  Tests Ordered: No orders of the defined types were placed in this encounter.  Medication Changes: No orders of the defined types were placed in this encounter.   Disposition:  in 6 month(s)  Signed, Jenean Lindau, MD  06/10/2018 10:55 AM    [place]

## 2018-06-14 ENCOUNTER — Ambulatory Visit: Payer: Medicare HMO | Admitting: Cardiology

## 2018-08-11 DIAGNOSIS — H52209 Unspecified astigmatism, unspecified eye: Secondary | ICD-10-CM | POA: Diagnosis not present

## 2018-08-11 DIAGNOSIS — H5213 Myopia, bilateral: Secondary | ICD-10-CM | POA: Diagnosis not present

## 2018-08-11 DIAGNOSIS — H524 Presbyopia: Secondary | ICD-10-CM | POA: Diagnosis not present

## 2018-08-12 IMAGING — DX DG CHEST 2V
2 series · 2 of 2 positions shown · non-contrast
Comparison: July 08, 2014

CLINICAL DATA: Coronary artery disease.  Hypertension.

EXAM:
CHEST  2 VIEW

[chest pa]
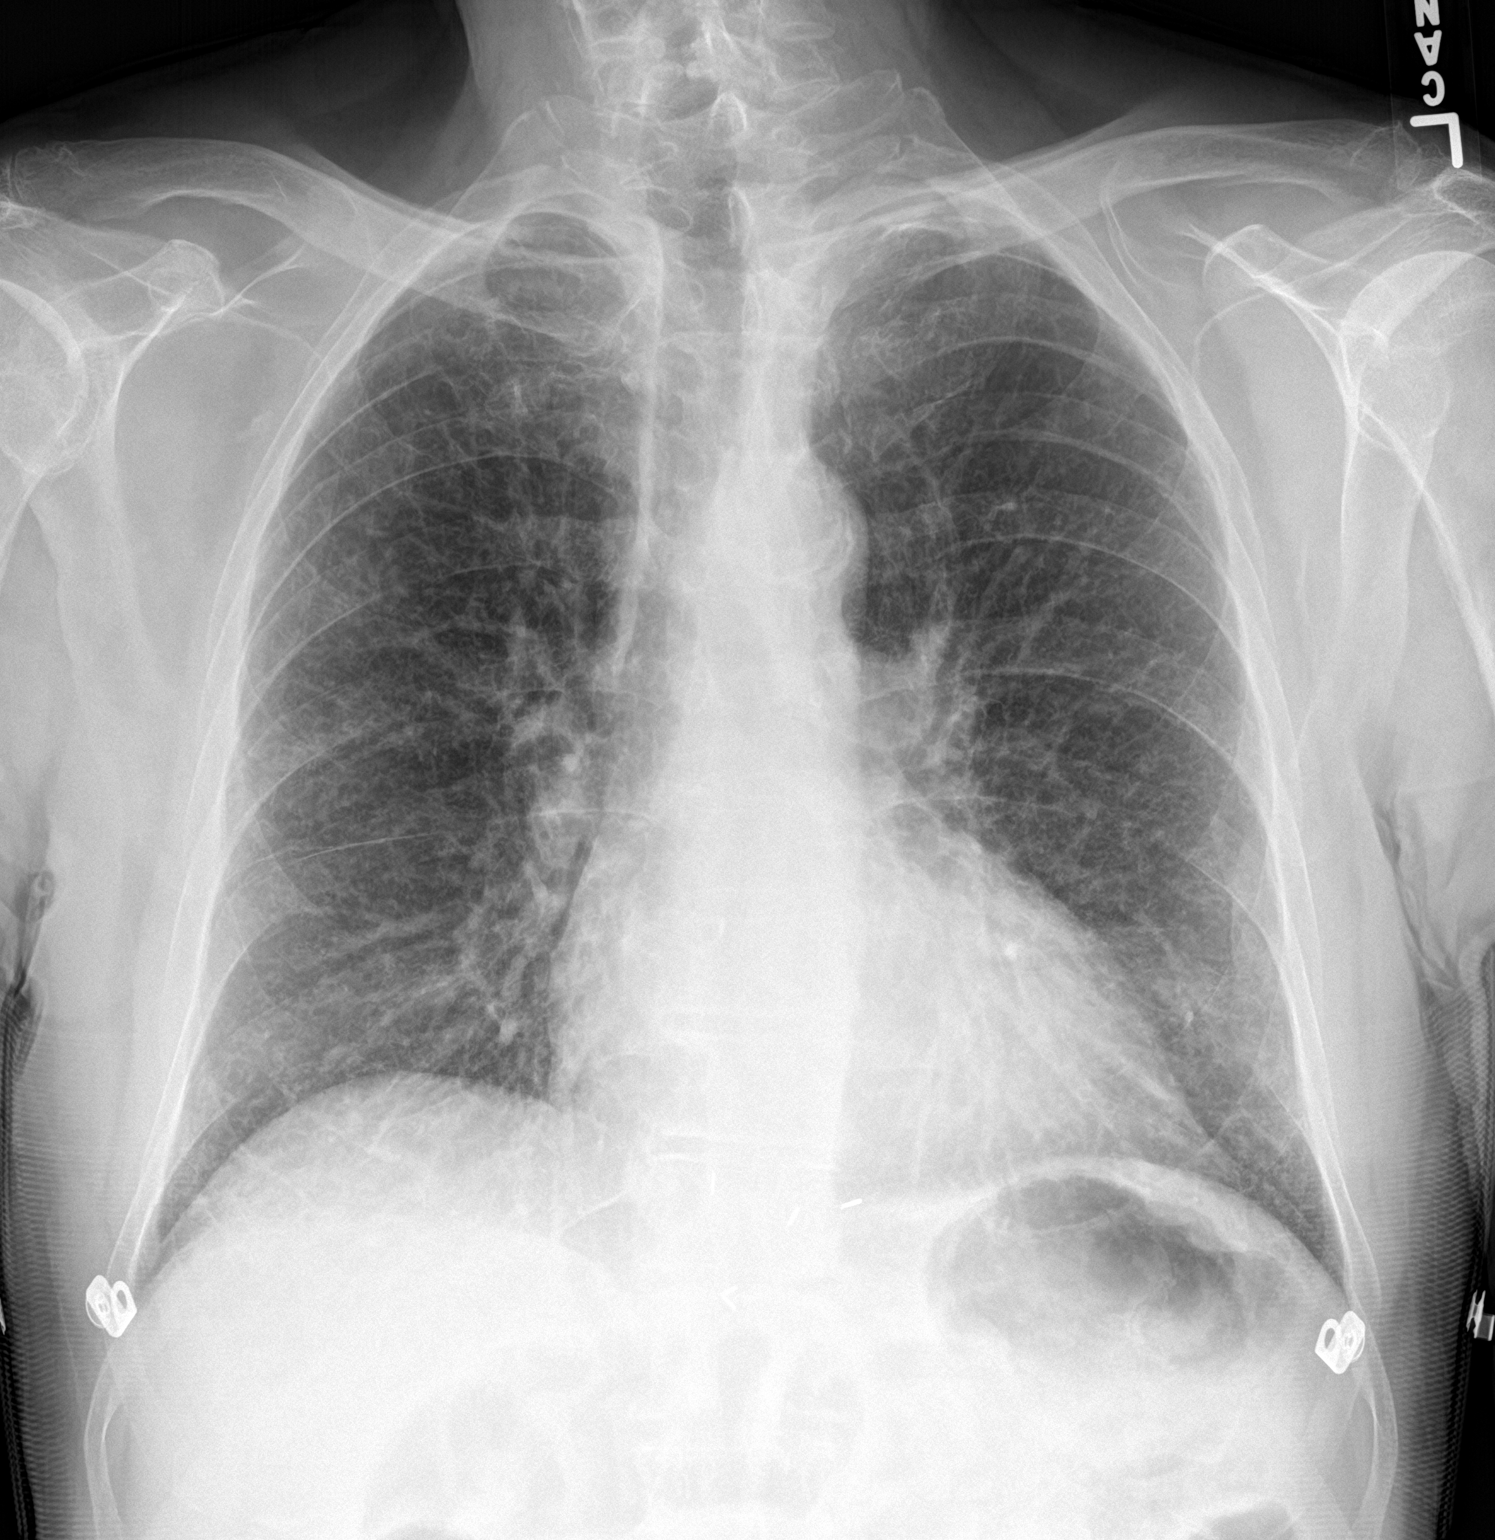

[chest lat]
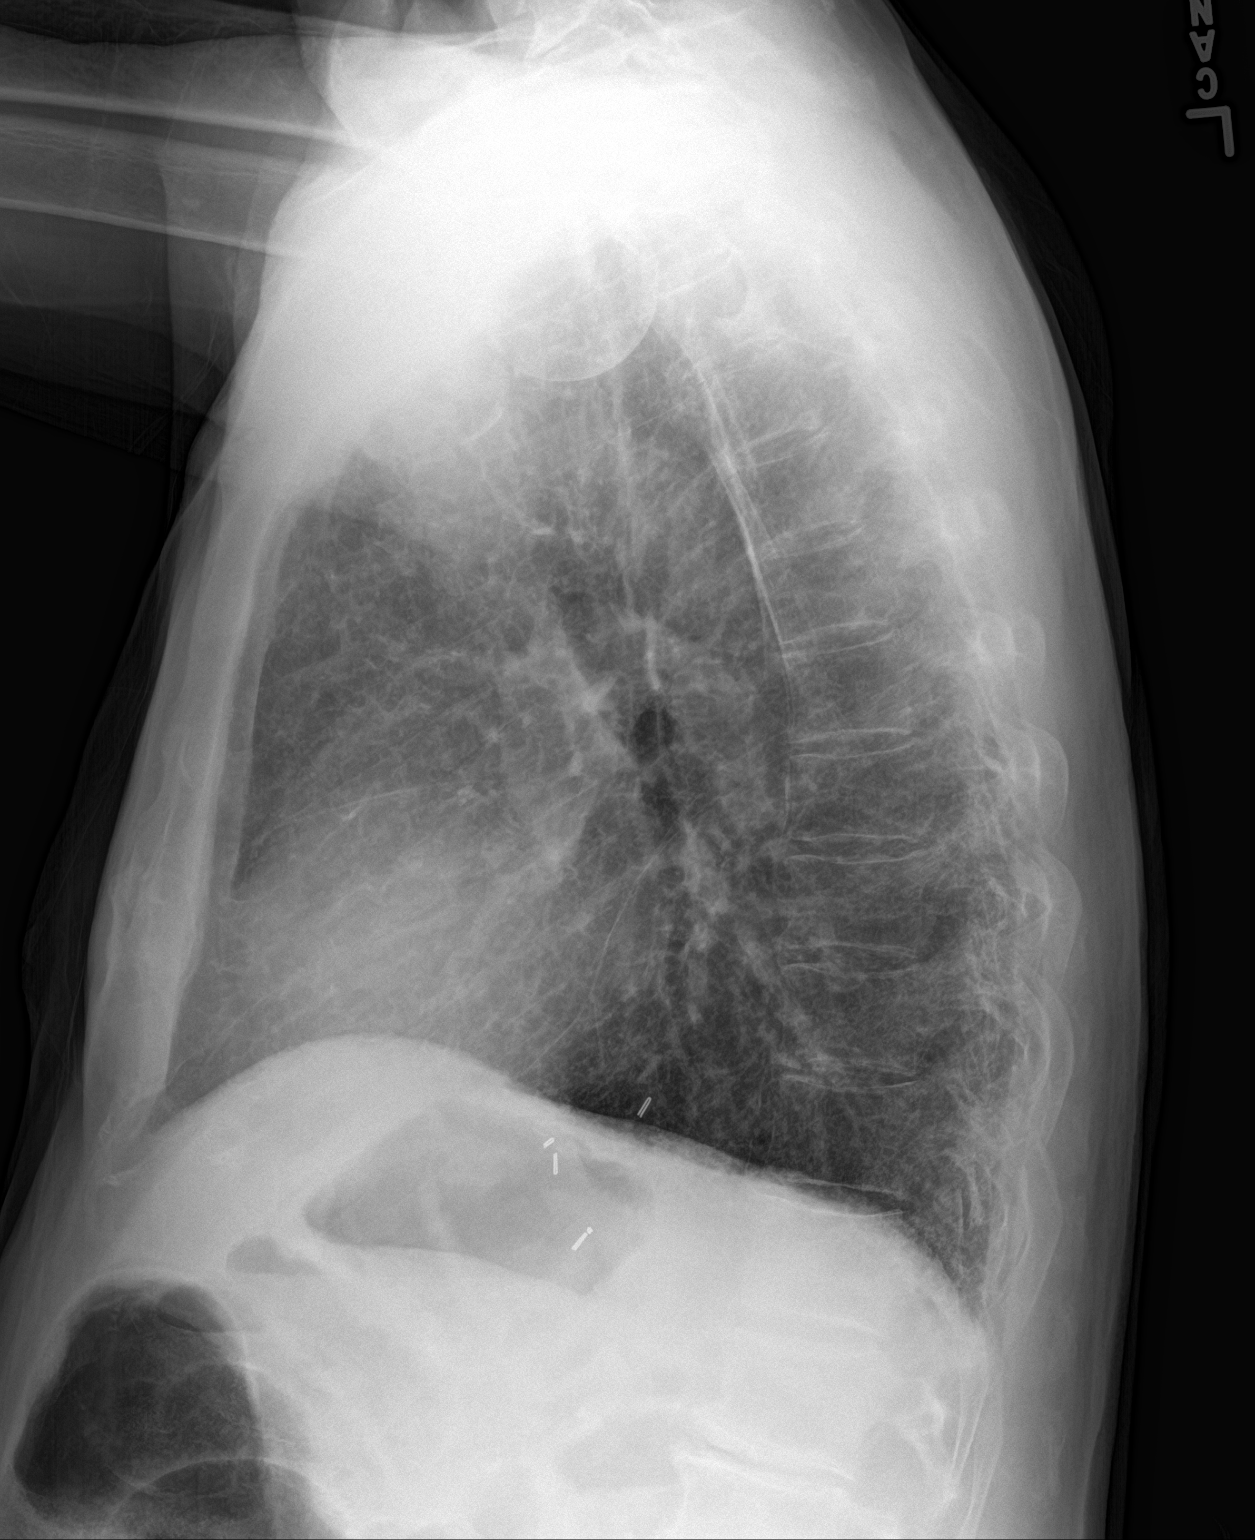

[2 of 2 positions shown; findings below may reference images not displayed]

FINDINGS: There is interstitial thickening which is stable. There is no edema
or consolidation. Heart size and pulmonary vascularity are normal.
No adenopathy. There is aortic atherosclerosis. There are small
clips at the gastroesophageal junction. There is evidence of mild
pleural thickening in the lateral left base, benign-appearing.
IMPRESSION: Stable interstitial thickening, likely due to chronic inflammatory
type change. No frank edema or consolidation. The heart size is
normal. There is aortic atherosclerosis. Surgical clips at the
gastroesophageal junction noted.

Aortic Atherosclerosis (COXYK-8G1.1).

## 2018-09-06 DIAGNOSIS — E782 Mixed hyperlipidemia: Secondary | ICD-10-CM | POA: Diagnosis not present

## 2018-09-06 DIAGNOSIS — Z0000 Encounter for general adult medical examination without abnormal findings: Secondary | ICD-10-CM | POA: Diagnosis not present

## 2018-09-06 DIAGNOSIS — E1149 Type 2 diabetes mellitus with other diabetic neurological complication: Secondary | ICD-10-CM | POA: Diagnosis not present

## 2018-09-12 DIAGNOSIS — E038 Other specified hypothyroidism: Secondary | ICD-10-CM | POA: Diagnosis not present

## 2018-09-12 DIAGNOSIS — R7301 Impaired fasting glucose: Secondary | ICD-10-CM | POA: Diagnosis not present

## 2018-09-12 DIAGNOSIS — F320 Major depressive disorder, single episode, mild: Secondary | ICD-10-CM | POA: Diagnosis not present

## 2018-09-12 DIAGNOSIS — I119 Hypertensive heart disease without heart failure: Secondary | ICD-10-CM | POA: Diagnosis not present

## 2018-09-12 DIAGNOSIS — E876 Hypokalemia: Secondary | ICD-10-CM | POA: Diagnosis not present

## 2018-09-12 DIAGNOSIS — E782 Mixed hyperlipidemia: Secondary | ICD-10-CM | POA: Diagnosis not present

## 2018-09-12 DIAGNOSIS — N183 Chronic kidney disease, stage 3 (moderate): Secondary | ICD-10-CM | POA: Diagnosis not present

## 2018-10-03 DIAGNOSIS — H6123 Impacted cerumen, bilateral: Secondary | ICD-10-CM | POA: Diagnosis not present

## 2018-10-12 DIAGNOSIS — H6123 Impacted cerumen, bilateral: Secondary | ICD-10-CM | POA: Diagnosis not present

## 2018-11-10 DIAGNOSIS — H6123 Impacted cerumen, bilateral: Secondary | ICD-10-CM | POA: Diagnosis not present

## 2018-11-17 DIAGNOSIS — R51 Headache: Secondary | ICD-10-CM | POA: Diagnosis not present

## 2018-12-14 DIAGNOSIS — E782 Mixed hyperlipidemia: Secondary | ICD-10-CM | POA: Diagnosis not present

## 2018-12-16 ENCOUNTER — Ambulatory Visit: Payer: Medicare HMO | Admitting: Cardiology

## 2018-12-19 DIAGNOSIS — R4181 Age-related cognitive decline: Secondary | ICD-10-CM | POA: Diagnosis not present

## 2018-12-19 DIAGNOSIS — E782 Mixed hyperlipidemia: Secondary | ICD-10-CM | POA: Diagnosis not present

## 2018-12-19 DIAGNOSIS — R7301 Impaired fasting glucose: Secondary | ICD-10-CM | POA: Diagnosis not present

## 2018-12-19 DIAGNOSIS — Z23 Encounter for immunization: Secondary | ICD-10-CM | POA: Diagnosis not present

## 2018-12-19 DIAGNOSIS — E038 Other specified hypothyroidism: Secondary | ICD-10-CM | POA: Diagnosis not present

## 2018-12-19 DIAGNOSIS — E876 Hypokalemia: Secondary | ICD-10-CM | POA: Diagnosis not present

## 2018-12-19 DIAGNOSIS — N182 Chronic kidney disease, stage 2 (mild): Secondary | ICD-10-CM | POA: Diagnosis not present

## 2018-12-19 DIAGNOSIS — F320 Major depressive disorder, single episode, mild: Secondary | ICD-10-CM | POA: Diagnosis not present

## 2018-12-19 DIAGNOSIS — I119 Hypertensive heart disease without heart failure: Secondary | ICD-10-CM | POA: Diagnosis not present

## 2018-12-21 ENCOUNTER — Ambulatory Visit (INDEPENDENT_AMBULATORY_CARE_PROVIDER_SITE_OTHER): Payer: Medicare HMO | Admitting: Cardiology

## 2018-12-21 ENCOUNTER — Encounter: Payer: Self-pay | Admitting: Cardiology

## 2018-12-21 ENCOUNTER — Other Ambulatory Visit: Payer: Self-pay

## 2018-12-21 VITALS — BP 116/60 | HR 90 | Ht 70.0 in | Wt 137.2 lb

## 2018-12-21 DIAGNOSIS — I2510 Atherosclerotic heart disease of native coronary artery without angina pectoris: Secondary | ICD-10-CM | POA: Diagnosis not present

## 2018-12-21 DIAGNOSIS — Z8673 Personal history of transient ischemic attack (TIA), and cerebral infarction without residual deficits: Secondary | ICD-10-CM

## 2018-12-21 DIAGNOSIS — E785 Hyperlipidemia, unspecified: Secondary | ICD-10-CM

## 2018-12-21 DIAGNOSIS — I10 Essential (primary) hypertension: Secondary | ICD-10-CM

## 2018-12-21 NOTE — Progress Notes (Signed)
Cardiology Office Note:    Date:  12/21/2018   ID:  Earl Mueller, DOB 1935-09-26, MRN [id number]  PCP:  Rochel Brome, MD  Cardiologist:  Jenean Lindau, MD   Referring MD: Rochel Brome, MD    ASSESSMENT:    1. Coronary artery disease involving native coronary artery of native heart without angina pectoris   2. Essential hypertension   3. History of stroke   4. Dyslipidemia    PLAN:    In order of problems listed above:  1. Coronary artery disease: Secondary prevention stressed with the patient.  Importance of compliance with diet and medication stressed and he vocalized understanding. 2. Essential hypertension: Blood pressure stable 3. Mixed dyslipidemia: Diet was discussed.  Labs from primary care office was reviewed and it is an excellent report. 4. Patient will be seen in follow-up appointment in 6 months or earlier if the patient has any concerns    Medication Adjustments/Labs and Tests Ordered: Current medicines are reviewed at length with the patient today.  Concerns regarding medicines are outlined above.  No orders of the defined types were placed in this encounter.  No orders of the defined types were placed in this encounter.    Chief Complaint  Patient presents with  . Follow-up     History of Present Illness:    Earl Mueller is a 83 y.o. male.  Patient has past medical history of coronary artery disease, essential hypertension and dyslipidemia.  He has history of peripheral vascular disease.  He denies any problems at this time and takes care of activities of daily living.  No chest pain orthopnea or PND.  At the time of my evaluation, the patient is alert awake oriented and in no distress.  He walks on a regular basis.  Past Medical History:  Diagnosis Date  . Anxiety   . Arthritis   . Cancer ([place])    skin  . Coronary artery disease    Dr. Geraldo Pitter with [facility]  . Hypertension    dr Lennox Solders  .  Hypothyroidism   . Peripheral vascular disease ([place])   . 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 BACK SURGERY    . CARDIAC CATHETERIZATION  03/06/10   NL-LM, 40%pLAD, 30% mid/distal LAD, 98% ostial DIAG (small), 30%pCx, 50%mCx, 30%dCx, 30% OM2, 40%dRCA; Med RX rec ([facility])  . caritid endart    . ENDARTERECTOMY Left 03/26/2017   Procedure: ENDARTERECTOMY CAROTID LEFT;  Surgeon: Rosetta Posner, MD;  [facility];  Service: Vascular;  Laterality: Left;  . femerao bypass    . LEFT HEART CATH AND CORONARY ANGIOGRAPHY N/A 02/03/2017   Procedure: LEFT HEART CATH AND CORONARY ANGIOGRAPHY;  Surgeon: Martinique, Peter M, MD;  Location: [place] CV LAB;  Service: Cardiovascular;  Laterality: N/A;  . PATCH ANGIOPLASTY Left 03/26/2017   Procedure: PATCH ANGIOPLASTY LEFT CAROTID ARTERY;  Surgeon: Rosetta Posner, MD;  Location: [place];  Service: Vascular;  Laterality: Left;  . SCALP LACERATION REPAIR N/A 05/23/2013   Procedure: EXCISION SCALP ULCER DEBRIDEMENT OF SKIN AND BONE WITH PLACEMENT OF A-CELL;  Surgeon: Irene Limbo, MD;  Location: [place];  Service: Plastics;  Laterality: N/A;  . STOMACH SURGERY     x2  . VASCULAR SURGERY     AFBG 10/15/05    Current Medications: Current Meds  Medication Sig  . Acetaminophen 500  MG coapsule Take by mouth every 4 (four) hours as needed.   Marland Kitchen aspirin EC 81 MG tablet Take 81 mg by mouth daily.  Marland Kitchen atorvastatin (LIPITOR) 80 MG tablet Take 80 mg by mouth daily at 6 PM.   . chlorthalidone (HYGROTON) 25 MG tablet Take 25 mg by mouth daily.   . cilostazol (PLETAL) 100 MG tablet Take 100 mg by mouth 2 (two) times daily.  . cloNIDine (CATAPRES) 0.1 MG tablet Take 0.1 mg by mouth 2 (two) times daily.  . clopidogrel (PLAVIX) 75 MG tablet Take 75 mg by mouth daily with breakfast.  . fenofibrate 160 MG tablet Take 160 mg by mouth daily.  Marland Kitchen levothyroxine (SYNTHROID, LEVOTHROID) 88 MCG tablet Take 88 mcg by  mouth once daily before breakfast  . Multiple Vitamins-Minerals (CENTRUM SILVER PO) Take 1 tablet by mouth daily.  . nitroGLYCERIN (NITROSTAT) 0.4 MG SL tablet Place 0.4 mg under the tongue every 5 (five) minutes as needed for chest pain.  Marland Kitchen PARoxetine (PAXIL) 20 MG tablet Take 20 mg by mouth daily.  . potassium chloride (K-DUR,KLOR-CON) 10 MEQ tablet Take 30 mEq by mouth 2 (two) times daily.      Allergies:   Other   Social History   Socioeconomic History  . Marital status: Married    Spouse name: Not on file  . Number of children: Not on file  . Years of education: Not on file  . Highest education level: Not on file  Occupational History  . Not on file  Social Needs  . Financial resource strain: Not on file  . Food insecurity    Worry: Not on file    Inability: Not on file  . Transportation needs    Medical: Not on file    Non-medical: Not on file  Tobacco Use  . Smoking status: Former Smoker    Quit date: 12/18/2007    Years since quitting: 11.0  . Smokeless tobacco: Never Used  Substance and Sexual Activity  . Alcohol use: Yes    Comment: occ  . Drug use: No  . Sexual activity: Not on file  Lifestyle  . Physical activity    Days per week: Not on file    Minutes per session: Not on file  . Stress: Not on file  Relationships  . Social Herbalist on phone: Not on file    Gets together: Not on file    Attends religious service: Not on file    Active member of club or organization: Not on file    Attends meetings of clubs or organizations: Not on file    Relationship status: Not on file  Other Topics Concern  . Not on file  Social History Narrative  . Not on file     Family History: The patient's family history includes Heart failure in his father.  ROS:   Please see the history of present illness.    All other systems reviewed and are negative.  EKGs/Labs/Other Studies Reviewed:    The following studies were reviewed today: Study date:  02/03/17  Physicians  Panel Physicians Referring Physician Case Authorizing Physician  Martinique, Peter M, MD (Primary)    Procedures  LEFT HEART CATH AND CORONARY ANGIOGRAPHY  Conclusion    Prox RCA lesion is 25% stenosed.  Mid RCA to Dist RCA lesion is 25% stenosed.  Ost LAD to Prox LAD lesion is 80% stenosed.  Mid LAD lesion is 60% stenosed.  1st Diag lesion is 100%  stenosed.  Ost Cx to Prox Cx lesion is 75% stenosed.  1st Mrg lesion is 50% stenosed.  The left ventricular systolic function is normal.  LV end diastolic pressure is normal.  The left ventricular ejection fraction is 55-65% by visual estimate.   1. Left dominant circulation 2. 2 vessel obstructive coronary artery disease    - 80% segmental proximal LAD- severely calcified    - 100% mid first diagonal with left to left collaterals.    - 75% focal proximal LCx - moderately calcified    - 50% mid OM1 3. Good overall LV function with focal mid anterior hypokinesis c/w diagonal occlusion. 4. Normal LVEDP       Recent Labs: No results found for requested labs within last 8760 hours.  Recent Lipid Panel No results found for: CHOL, TRIG, HDL, CHOLHDL, VLDL, LDLCALC, LDLDIRECT  Physical Exam:    VS:  BP 116/60   Pulse 90   Ht 5\' 10"  (1.778 m)   Wt 137 lb 3.2 oz (62.2 kg)   SpO2 98%   BMI 19.69 kg/m     Wt Readings from Last 3 Encounters:  12/21/18 137 lb 3.2 oz (62.2 kg)  06/10/18 136 lb 3.2 oz (61.8 kg)  12/09/17 143 lb (64.9 kg)     GEN: Patient is in no acute distress HEENT: Normal NECK: No JVD; No carotid bruits LYMPHATICS: No lymphadenopathy CARDIAC: Hear sounds regular, 2/6 systolic murmur at the apex. RESPIRATORY:  Clear to auscultation without rales, wheezing or rhonchi  ABDOMEN: Soft, non-tender, non-distended MUSCULOSKELETAL:  No edema; No deformity  SKIN: Warm and dry NEUROLOGIC:  Alert and oriented x 3 PSYCHIATRIC:  Normal affect   Signed, Jenean Lindau, MD   12/21/2018 1:33 PM    [facility]

## 2018-12-21 NOTE — Patient Instructions (Signed)

## 2019-02-14 DIAGNOSIS — H5212 Myopia, left eye: Secondary | ICD-10-CM | POA: Diagnosis not present

## 2019-03-23 DIAGNOSIS — R7301 Impaired fasting glucose: Secondary | ICD-10-CM | POA: Diagnosis not present

## 2019-03-23 DIAGNOSIS — E782 Mixed hyperlipidemia: Secondary | ICD-10-CM | POA: Diagnosis not present

## 2019-03-23 DIAGNOSIS — Z0100 Encounter for examination of eyes and vision without abnormal findings: Secondary | ICD-10-CM | POA: Diagnosis not present

## 2019-03-23 DIAGNOSIS — E038 Other specified hypothyroidism: Secondary | ICD-10-CM | POA: Diagnosis not present

## 2019-03-23 DIAGNOSIS — I119 Hypertensive heart disease without heart failure: Secondary | ICD-10-CM | POA: Diagnosis not present

## 2019-03-30 DIAGNOSIS — I129 Hypertensive chronic kidney disease with stage 1 through stage 4 chronic kidney disease, or unspecified chronic kidney disease: Secondary | ICD-10-CM | POA: Diagnosis not present

## 2019-03-30 DIAGNOSIS — F320 Major depressive disorder, single episode, mild: Secondary | ICD-10-CM | POA: Diagnosis not present

## 2019-03-30 DIAGNOSIS — R7301 Impaired fasting glucose: Secondary | ICD-10-CM | POA: Diagnosis not present

## 2019-03-30 DIAGNOSIS — I70213 Atherosclerosis of native arteries of extremities with intermittent claudication, bilateral legs: Secondary | ICD-10-CM | POA: Diagnosis not present

## 2019-03-30 DIAGNOSIS — E038 Other specified hypothyroidism: Secondary | ICD-10-CM | POA: Diagnosis not present

## 2019-03-30 DIAGNOSIS — N182 Chronic kidney disease, stage 2 (mild): Secondary | ICD-10-CM | POA: Diagnosis not present

## 2019-03-30 DIAGNOSIS — E782 Mixed hyperlipidemia: Secondary | ICD-10-CM | POA: Diagnosis not present

## 2019-05-02 ENCOUNTER — Other Ambulatory Visit: Payer: Self-pay | Admitting: Family Medicine

## 2019-06-03 ENCOUNTER — Other Ambulatory Visit: Payer: Self-pay | Admitting: Family Medicine

## 2019-06-09 ENCOUNTER — Other Ambulatory Visit: Payer: Self-pay

## 2019-06-09 MED ORDER — CILOSTAZOL 100 MG PO TABS: 100.0000 mg | ORAL_TABLET | Freq: Two times a day (BID) | ORAL | 0 refills | Status: DC

## 2019-06-12 ENCOUNTER — Other Ambulatory Visit: Payer: Self-pay

## 2019-06-12 MED ORDER — ZOSTER VAC RECOMB ADJUVANTED 50 MCG/0.5ML IM SUSR: 0.5000 mL | Freq: Once | INTRAMUSCULAR | 0 refills | Status: AC

## 2019-06-13 ENCOUNTER — Other Ambulatory Visit: Payer: Self-pay

## 2019-06-13 MED ORDER — CILOSTAZOL 100 MG PO TABS: 100.0000 mg | ORAL_TABLET | Freq: Two times a day (BID) | ORAL | 0 refills | Status: DC

## 2019-07-05 ENCOUNTER — Encounter: Payer: Self-pay | Admitting: Cardiology

## 2019-07-05 ENCOUNTER — Telehealth: Payer: Self-pay | Admitting: Family Medicine

## 2019-07-05 ENCOUNTER — Ambulatory Visit: Payer: Medicare HMO | Admitting: Cardiology

## 2019-07-05 ENCOUNTER — Other Ambulatory Visit: Payer: Self-pay

## 2019-07-05 VITALS — BP 120/70 | HR 76 | Temp 97.3°F | Ht 70.5 in | Wt 147.8 lb

## 2019-07-05 DIAGNOSIS — I2510 Atherosclerotic heart disease of native coronary artery without angina pectoris: Secondary | ICD-10-CM | POA: Diagnosis not present

## 2019-07-05 DIAGNOSIS — E785 Hyperlipidemia, unspecified: Secondary | ICD-10-CM

## 2019-07-05 DIAGNOSIS — I10 Essential (primary) hypertension: Secondary | ICD-10-CM

## 2019-07-05 DIAGNOSIS — Z8673 Personal history of transient ischemic attack (TIA), and cerebral infarction without residual deficits: Secondary | ICD-10-CM | POA: Diagnosis not present

## 2019-07-05 DIAGNOSIS — N289 Disorder of kidney and ureter, unspecified: Secondary | ICD-10-CM | POA: Diagnosis not present

## 2019-07-05 DIAGNOSIS — Z79899 Other long term (current) drug therapy: Secondary | ICD-10-CM | POA: Diagnosis not present

## 2019-07-05 NOTE — Patient Instructions (Signed)
Medication Instructions:  Your physician recommends that you continue on your current medications as directed. Please refer to the Current Medication list given to you today.  *If you need a refill on your cardiac medications before your next appointment, please call your pharmacy*   Lab Work: Your physician recommends that you return for lab work in: TODAY BMP, CBC, TSH, LFT's, Lipids If you have labs (blood work) drawn today and your tests are completely normal, you will receive your results only by: Marland Kitchen MyChart Message (if you have MyChart) OR . A paper copy in the mail If you have any lab test that is abnormal or we need to change your treatment, we will call you to review the results.   Testing/Procedures: None   Follow-Up: At [facility], you and your health needs are our priority.  As part of our continuing mission to provide you with exceptional heart care, we have created designated Provider Care Teams.  These Care Teams include your primary Cardiologist (physician) and Advanced Practice Providers (APPs -  Physician Assistants and Nurse Practitioners) who all work together to provide you with the care you need, when you need it.  We recommend signing up for the patient portal called "MyChart".  Sign up information is provided on this After Visit Summary.  MyChart is used to connect with patients for Virtual Visits (Telemedicine).  Patients are able to view lab/test results, encounter notes, upcoming appointments, etc.  Non-urgent messages can be sent to your provider as well.   To learn more about what you can do with MyChart, go to [email or web address].    Your next appointment:   6 month(s)  The format for your next appointment:   In Person  Provider:   Jyl Heinz, MD   Other Instructions

## 2019-07-05 NOTE — Progress Notes (Signed)
  Chronic Care Management   Note  07/05/2019 Name: AANAY MARRISON MRN: [id number] DOB: 1935/05/30  PRIEST GARG is a 84 y.o. year old male who is a primary care patient of Cox, Kirsten, MD. I reached out to [name] by phone today in response to a referral sent by Mr. Dudley Major PCP, CoxElnita Maxwell, MD.   Mr. Crismon was given information about Chronic Care Management services today including:  1. CCM service includes personalized support from designated clinical staff supervised by his physician, including individualized plan of care and coordination with other care providers 2. 24/7 contact phone numbers for assistance for urgent and routine care needs. 3. Service will only be billed when office clinical staff spend 20 minutes or more in a month to coordinate care. 4. Only one practitioner may furnish and bill the service in a calendar month. 5. The patient may stop CCM services at any time (effective at the end of the month) by phone call to the office staff.   Patient agreed to services and verbal consent obtained.   Follow up plan:   Earney Hamburg Upstream Scheduler

## 2019-07-05 NOTE — Progress Notes (Signed)
Cardiology Office Note:    Date:  07/05/2019   ID:  [name], DOB February 11, 1936, MRN [id number]  PCP:  Rochel Brome, MD  Cardiologist:  Jenean Lindau, MD   Referring MD: Rochel Brome, MD    ASSESSMENT:    1. Coronary artery disease involving native coronary artery of native heart without angina pectoris   2. Essential hypertension   3. History of stroke   4. Dyslipidemia    PLAN:    In order of problems listed above:  1. Coronary artery disease: Secondary prevention stressed to the patient.  Importance of compliance with diet and medication stressed and he vocalized understanding and he promises to comply.  He was advised to walk at least half an hour a day 5 days a week and he promises to follow this meticulously. 2. Essential hypertension: Blood pressure stable.  Patient is on multiple medications and will have blood work today including electrolytes 3. Mixed dyslipidemia: Diet was emphasized.  He is fasting and will have a lipid check among other lab work.  History of stroke: Stable at this time and managed by primary care physician. 4. Patient will be seen in follow-up appointment in 6 months or earlier if the patient has any concerns   Medication Adjustments/Labs and Tests Ordered: Current medicines are reviewed at length with the patient today.  Concerns regarding medicines are outlined above.  No orders of the defined types were placed in this encounter.  No orders of the defined types were placed in this encounter.    No chief complaint on file.    History of Present Illness:    Earl Mueller is a 84 y.o. male.  Patient has past medical history of coronary artery disease, essential hypertension dyslipidemia and history of stroke.  He denies any problems at this time and takes care of activities of daily living.  No chest pain orthopnea or PND.  At the time of my evaluation, the patient is alert awake oriented and in no distress.  Past Medical History:    Diagnosis Date  . Anxiety   . Arthritis   . Cancer ([place])    skin  . Carotid artery stenosis 03/26/2017  . Carotid bruit 11/25/2016  . Coronary artery disease    Dr. Geraldo Pitter with [facility] [place]  . Coronary artery disease involving native coronary artery of native heart without angina pectoris 11/20/2014  . Dyslipidemia 11/20/2014  . Essential hypertension 11/20/2014  . History of stroke 11/20/2014  . Hypertension    dr Lennox Solders  . Hypothyroidism   . Peripheral vascular disease ([place])   . Pre-operative clearance 01/18/2017  . Skin ulcer of scalp ([place]) 05/10/2013  . 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 Symptomatic carotid artery stenosis 03/05/2017    Past Surgical History:  Procedure Laterality Date  . BACK SURGERY    . CARDIAC CATHETERIZATION  03/06/10   NL-LM, 40%pLAD, 30% mid/distal LAD, 98% ostial DIAG (small), 30%pCx, 50%mCx, 30%dCx, 30% OM2, 40%dRCA; Med RX rec ([facility])  . caritid endart    . ENDARTERECTOMY Left 03/26/2017   Procedure: ENDARTERECTOMY CAROTID LEFT;  Surgeon: Rosetta Posner, MD;  Location: [facility] OR;  Service: Vascular;  Laterality: Left;  . femerao bypass    . LEFT HEART CATH AND CORONARY ANGIOGRAPHY N/A 02/03/2017   Procedure: LEFT HEART CATH AND CORONARY ANGIOGRAPHY;  Surgeon: Martinique, Peter M, MD;  Location: [facility] LAB;  Service: Cardiovascular;  Laterality: N/A;  . PATCH ANGIOPLASTY Left 03/26/2017   Procedure: PATCH ANGIOPLASTY LEFT CAROTID ARTERY;  Surgeon: Rosetta Posner, MD;  Location: [facility];  Service: Vascular;  Laterality: Left;  . SCALP LACERATION REPAIR N/A 05/23/2013   Procedure: EXCISION SCALP ULCER DEBRIDEMENT OF SKIN AND BONE WITH PLACEMENT OF A-CELL;  Surgeon: Irene Limbo, MD;  Location: [facility];  Service: Plastics;  Laterality: N/A;  . STOMACH SURGERY     x2  . VASCULAR SURGERY     [facility] 10/15/05    Current Medications: Current Meds  Medication Sig  . Acetaminophen 500 MG coapsule Take by  mouth every 4 (four) hours as needed.   Marland Kitchen aspirin EC 81 MG tablet Take 81 mg by mouth daily.  Marland Kitchen atorvastatin (LIPITOR) 80 MG tablet Take 80 mg by mouth daily at 6 PM.   . chlorthalidone (HYGROTON) 25 MG tablet Take 25 mg by mouth daily.   . cilostazol (PLETAL) 100 MG tablet Take 1 tablet (100 mg total) by mouth 2 (two) times daily.  . cloNIDine (CATAPRES) 0.1 MG tablet Take 0.1 mg by mouth 2 (two) times daily.  . clopidogrel (PLAVIX) 75 MG tablet TAKE 1 TABLET EVERY DAY  . fenofibrate 160 MG tablet Take 160 mg by mouth daily.  Marland Kitchen levothyroxine (SYNTHROID, LEVOTHROID) 88 MCG tablet Take 88 mcg by mouth once daily before breakfast  . Multiple Vitamins-Minerals (CENTRUM SILVER PO) Take 1 tablet by mouth daily.  . nitroGLYCERIN (NITROSTAT) 0.4 MG SL tablet Place 0.4 mg under the tongue every 5 (five) minutes as needed for chest pain.  Marland Kitchen PARoxetine (PAXIL) 20 MG tablet Take 20 mg by mouth daily.  . potassium chloride (KLOR-CON) 10 MEQ tablet TAKE 3 TABLETS TWICE DAILY     Allergies:   Other   Social History   Socioeconomic History  . Marital status: Married    Spouse name: Not on file  . Number of children: Not on file  . Years of education: Not on file  . Highest education level: Not on file  Occupational History  . Not on file  Tobacco Use  . Smoking status: Former Smoker    Quit date: 12/18/2007    Years since quitting: 11.5  . Smokeless tobacco: Never Used  Substance and Sexual Activity  . Alcohol use: Yes    Comment: occ  . Drug use: No  . Sexual activity: Not on file  Other Topics Concern  . Not on file  Social History Narrative  . Not on file   Social Determinants of Health   Financial Resource Strain:   . Difficulty of Paying Living Expenses:   Food Insecurity:   . Worried About Charity fundraiser in the Last Year:   . Arboriculturist in the Last Year:   Transportation Needs:   . Film/video editor (Medical):   Marland Kitchen Lack of Transportation (Non-Medical):    Physical Activity:   . Days of Exercise per Week:   . Minutes of Exercise per Session:   Stress:   . Feeling of Stress :   Social Connections:   . Frequency of Communication with Friends and Family:   . Frequency of Social Gatherings with Friends and Family:   . Attends Religious Services:   . Active Member of Clubs or Organizations:   . Attends Archivist Meetings:   Marland Kitchen Marital Status:      Family History: The patient's family history includes Heart failure in his father.  ROS:  Please see the history of present illness.    All other systems reviewed and are negative.  EKGs/Labs/Other Studies Reviewed:    The following studies were reviewed today: I discussed my findings with the patient at extensive length   Recent Labs: No results found for requested labs within last 8760 hours.  Recent Lipid Panel No results found for: CHOL, TRIG, HDL, CHOLHDL, VLDL, LDLCALC, LDLDIRECT  Physical Exam:    VS:  BP 120/70   Pulse 76   Temp (!) 97.3 F (36.3 C)   Ht 5' 10.5" (1.791 m)   Wt 147 lb 12.8 oz (67 kg)   SpO2 95%   BMI 20.91 kg/m     Wt Readings from Last 3 Encounters:  07/05/19 147 lb 12.8 oz (67 kg)  12/21/18 137 lb 3.2 oz (62.2 kg)  06/10/18 136 lb 3.2 oz (61.8 kg)     GEN: Patient is in no acute distress HEENT: Normal NECK: No JVD; No carotid bruits LYMPHATICS: No lymphadenopathy CARDIAC: Hear sounds regular, 2/6 systolic murmur at the apex. RESPIRATORY:  Clear to auscultation without rales, wheezing or rhonchi  ABDOMEN: Soft, non-tender, non-distended MUSCULOSKELETAL:  No edema; No deformity  SKIN: Warm and dry NEUROLOGIC:  Alert and oriented x 3 PSYCHIATRIC:  Normal affect   Signed, Jenean Lindau, MD  07/05/2019 9:43 AM    [place]

## 2019-07-06 LAB — LIPID PANEL
Chol/HDL Ratio: 2.6 ratio (ref 0.0–5.0)
Cholesterol, Total: 148 mg/dL (ref 100–199)
HDL: 56 mg/dL (ref >39)
LDL Chol Calc (NIH): 79 mg/dL (ref 0–99)
Triglycerides: 67 mg/dL (ref 0–149)
VLDL Cholesterol Cal: 13 mg/dL (ref 5–40)

## 2019-07-06 LAB — HEPATIC FUNCTION PANEL
ALT: 25 IU/L (ref 0–44)
AST: 35 IU/L (ref 0–40)
Albumin: 4.5 g/dL (ref 3.6–4.6)
Alkaline Phosphatase: 87 IU/L (ref 39–117)
Bilirubin Total: 0.6 mg/dL (ref 0.0–1.2)
Bilirubin, Direct: 0.21 mg/dL (ref 0.00–0.40)
Total Protein: 7.8 g/dL (ref 6.0–8.5)

## 2019-07-06 LAB — BASIC METABOLIC PANEL
BUN/Creatinine Ratio: 16 (ref 10–24)
BUN: 22 mg/dL (ref 8–27)
CO2: 25 mmol/L (ref 20–29)
Calcium: 9.8 mg/dL (ref 8.6–10.2)
Chloride: 97 mmol/L (ref 96–106)
Creatinine, Ser: 1.35 mg/dL — ABNORMAL HIGH (ref 0.76–1.27)
GFR calc Af Amer: 57 mL/min/{1.73_m2} — ABNORMAL LOW (ref >59)
GFR calc non Af Amer: 49 mL/min/{1.73_m2} — ABNORMAL LOW (ref >59)
Glucose: 91 mg/dL (ref 65–99)
Potassium: 3.8 mmol/L (ref 3.5–5.2)
Sodium: 137 mmol/L (ref 134–144)

## 2019-07-06 LAB — CBC
Hematocrit: 47.1 % (ref 37.5–51.0)
Hemoglobin: 15.9 g/dL (ref 13.0–17.7)
MCH: 33 pg (ref 26.6–33.0)
MCHC: 33.8 g/dL (ref 31.5–35.7)
MCV: 98 fL — ABNORMAL HIGH (ref 79–97)
Platelets: 324 10*3/uL (ref 150–450)
RBC: 4.82 x10E6/uL (ref 4.14–5.80)
RDW: 12.9 % (ref 11.6–15.4)
WBC: 12.6 10*3/uL — ABNORMAL HIGH (ref 3.4–10.8)

## 2019-07-06 LAB — TSH: TSH: 10.8 u[IU]/mL — ABNORMAL HIGH (ref 0.450–4.500)

## 2019-07-06 NOTE — Addendum Note (Signed)
Addended by: Truddie Hidden on: 07/06/2019 04:09 PM   Modules accepted: Orders

## 2019-07-11 ENCOUNTER — Other Ambulatory Visit: Payer: Self-pay

## 2019-07-11 DIAGNOSIS — E039 Hypothyroidism, unspecified: Secondary | ICD-10-CM

## 2019-07-11 MED ORDER — LEVOTHYROXINE SODIUM 100 MCG PO TABS: 100.0000 ug | ORAL_TABLET | Freq: Every day | ORAL | 1 refills | Status: DC

## 2019-08-06 NOTE — Chronic Care Management (AMB) (Signed)
Chronic Care Management Pharmacy  Name: Earl Mueller  MRN: [id number] DOB: Jul 17, 1935  Chief Complaint/ HPI  Earl Mueller,  84 y.o. , male presents for their Initial CCM visit with the clinical pharmacist via telephone due to COVID-19 Pandemic.  PCP : Rochel Brome, MD  Their chronic conditions include: HTN, Peripheral vascular disease, Carotid artery stenosis, CAD, Hx of stroke, HLD, anxiety, arthritis, hypothyroidism.  Office Visits: 03/30/2019 - No change in medications. Mild depression. CBC normal/CMP.  GFR 48. Normal lipid panel.  Consult Visit: 07/05/2019 - Dr. Higinio Roger - secondary stroke prevention stressed for diet and medications. 03/23/2019 - Dr. Gilford Rile for eye exam.   Medications: Outpatient Encounter Medications as of 08/07/2019  Medication Sig  . aspirin EC 81 MG tablet Take 81 mg by mouth daily.  Marland Kitchen atorvastatin (LIPITOR) 80 MG tablet Take 80 mg by mouth daily at 6 PM.   . chlorthalidone (HYGROTON) 25 MG tablet Take 25 mg by mouth daily.   . cilostazol (PLETAL) 100 MG tablet Take 1 tablet (100 mg total) by mouth 2 (two) times daily.  . clopidogrel (PLAVIX) 75 MG tablet TAKE 1 TABLET EVERY DAY  . fenofibrate 160 MG tablet Take 160 mg by mouth daily.  Marland Kitchen levothyroxine (SYNTHROID) 100 MCG tablet Take 1 tablet (100 mcg total) by mouth daily before breakfast.  . Multiple Vitamins-Minerals (CENTRUM SILVER PO) Take 1 tablet by mouth daily.  . nitroGLYCERIN (NITROSTAT) 0.4 MG SL tablet Place 0.4 mg under the tongue every 5 (five) minutes as needed for chest pain.  Marland Kitchen PARoxetine (PAXIL) 20 MG tablet Take 20 mg by mouth daily.  . potassium chloride (KLOR-CON) 10 MEQ tablet TAKE 3 TABLETS TWICE DAILY  . Acetaminophen 500 MG coapsule Take by mouth every 4 (four) hours as needed.   . cloNIDine (CATAPRES) 0.1 MG tablet Take 0.1 mg by mouth 2 (two) times daily.   No facility-administered encounter medications on file as of 08/07/2019.     Current Diagnosis/Assessment:  Goals  Addressed            This Visit's Progress   . Pharmacy Care Plan       CARE PLAN ENTRY  Current Barriers:  . Chronic Disease Management support, education, and care coordination needs related to Hypertension and Hyperlipidemia   Hypertension . Pharmacist Clinical Goal(s): o Over the next 90 days, patient will work with PharmD and providers to maintain BP goal <130/80 . Current regimen:  o Chlorthalidone 25 mg daily . Interventions: o Removed clonidine from patient's med list due to no longer taking.  o Recommended patient continue to eat heart-healthy diet.  . Patient self care activities - Over the next 90 days, patient will: o Check BP with symptoms, document, and provide at future appointments o Ensure daily salt intake < 2300 mg/day  Hyperlipidemia . Pharmacist Clinical Goal(s): o Over the next 90 days, patient will work with PharmD and providers to maintain LDL goal < 100 . Current regimen:  o Atorvastatin 80 mg daily and Fenofibrate 160 mg daily . Interventions: o Recommend patient continue to eat heart healthy diet.  . Patient self care activities - Over the next 90 days, patient will: o Continue to take medications as prescribed and follow-up with MD as directed.   Medication management . Pharmacist Clinical Goal(s): o Over the next 90 days, patient will work with PharmD and providers to maintain optimal medication adherence . Current pharmacy: [facility] . Interventions o Comprehensive medication review performed. o Continue  current medication management strategy . Patient self care activities - Over the next 90 days, patient will: o Focus on medication adherence by continuing to use pill box.  o Take medications as prescribed o Report any questions or concerns to PharmD and/or provider(s)  Initial goal documentation       Hyperlipidemia   Lipid Panel     Component Value Date/Time   CHOL 148 07/05/2019 1003   TRIG 67 07/05/2019 1003   HDL 56  07/05/2019 1003   CHOLHDL 2.6 07/05/2019 1003   LDLCALC 79 07/05/2019 1003   LABVLDL 13 07/05/2019 1003     The ASCVD Risk score (Goff DC Jr., et al., 2013) failed to calculate for the following reasons:   The 2013 ASCVD risk score is only valid for ages 1 to 64   The patient has a prior MI or stroke diagnosis   Patient has failed these meds in past: zetia Patient is currently controlled on the following medications: aspirin ec 81 mg daily, atorvastatin 80 mg daily, fenofibrate 160 mg daily, nitroglycerin 0.4 mg prn, cilostazole 100 mg bid, clopidogrel 75 mg daily  We discussed:  diet and exercise extensively. Patient does not exercise. Wife reports that he eats what she cooks but she has been sick and not cooking. She has started cooking again so is back on track eating healthier.   Plan  Continue current medications   Hypertension   BP today is:  <130/80  Office blood pressures are  BP Readings from Last 3 Encounters:  07/05/19 120/70  12/21/18 116/60  06/10/18 127/79    Patient has failed these meds in the past: isosorbide dn 30 mg dialy Patient is currently controlled on the following medications: chlorthalidone 25 mg daily, potassium 10 meq bid  Patient checks BP at home infrequently  Patient home BP readings are ranging: in goal per patient  We discussed diet and exercise extensively  Plan  Continue current medications   Hypothyroidism   TSH  Date Value Ref Range Status  07/05/2019 10.800 (H) 0.450 - 4.500 uIU/mL Final     Patient has failed these meds in past: n/a Patient is currently uncontrolled on the following medications: levothyroxine 100 mcg daily  We discussed: Patient will have thyroid levels rechecked in June 2021 to assess new incrased dose.   Plan  Continue current medications     and  Other Diagnosis:Anxiety    Patient has failed these meds in past: n/a Patient is currently controlled on the following medications: paroxetine 20 mg  daily  We discussed: Wife indicates that patient is well managed and the same as he always is.   Plan  Continue current medications     Health Maintenance   Patient is currently controlled on the following medications:  Multivitamin daily - general health We discussed:  Patient's wife indicates   Plan  Continue current medications  Vaccines   Reviewed and discussed patient's vaccination history.  Patient's last TDAP was 11/17. Penumovax 23 in 2010.  Patient has had both COVID vaccines. Has had first Shingrix shot and second one will be in July.   Immunization History  Administered Date(s) Administered  . Zoster Recombinat (Shingrix) 06/12/2019    Plan  Recommended patient receive annual flu vaccine in office.   Medication Management   Pt uses [facility] mail order pharmacy for all medications Uses pill box? Yes Pt endorses good compliance - may miss an occasional evening dose.   We discussed: Patient is doing well and wife  fills his pill box.   Plan  Continue current medication management strategy    Follow up: 6 month phone visit

## 2019-08-07 ENCOUNTER — Ambulatory Visit: Payer: Medicare HMO

## 2019-08-07 DIAGNOSIS — E785 Hyperlipidemia, unspecified: Secondary | ICD-10-CM

## 2019-08-07 DIAGNOSIS — I10 Essential (primary) hypertension: Secondary | ICD-10-CM

## 2019-08-07 NOTE — Patient Instructions (Addendum)
Visit Information  Thank you for your time discussing your medications. I look forward to working with you to achieve your health care goals. Below is a summary of what we talked about during our visit.   Goals Addressed            This Visit's Progress   . Pharmacy Care Plan       CARE PLAN ENTRY  Current Barriers:  . Chronic Disease Management support, education, and care coordination needs related to Hypertension and Hyperlipidemia   Hypertension . Pharmacist Clinical Goal(s): o Over the next 90 days, patient will work with PharmD and providers to maintain BP goal <130/80 . Current regimen:  o Chlorthalidone 25 mg daily . Interventions: o Removed clonidine from patient's med list due to no longer taking.  o Recommended patient continue to eat heart-healthy diet.  . Patient self care activities - Over the next 90 days, patient will: o Check BP with symptoms, document, and provide at future appointments o Ensure daily salt intake < 2300 mg/day  Hyperlipidemia . Pharmacist Clinical Goal(s): o Over the next 90 days, patient will work with PharmD and providers to maintain LDL goal < 100 . Current regimen:  o Atorvastatin 80 mg daily and Fenofibrate 160 mg daily . Interventions: o Recommend patient continue to eat heart healthy diet.  . Patient self care activities - Over the next 90 days, patient will: o Continue to take medications as prescribed and follow-up with MD as directed.   Medication management . Pharmacist Clinical Goal(s): o Over the next 90 days, patient will work with PharmD and providers to maintain optimal medication adherence . Current pharmacy: [facility] . Interventions o Comprehensive medication review performed. o Continue current medication management strategy . Patient self care activities - Over the next 90 days, patient will: o Focus on medication adherence by continuing to use pill box.  o Take medications as prescribed o Report any  questions or concerns to PharmD and/or provider(s)  Initial goal documentation        Mr. Henslee was given information about Chronic Care Management services today including:  1. CCM service includes personalized support from designated clinical staff supervised by his physician, including individualized plan of care and coordination with other care providers 2. 24/7 contact phone numbers for assistance for urgent and routine care needs. 3. Standard insurance, coinsurance, copays and deductibles apply for chronic care management only during months in which we provide at least 20 minutes of these services. Most insurances cover these services at 100%, however patients may be responsible for any copay, coinsurance and/or deductible if applicable. This service may help you avoid the need for more expensive face-to-face services. 4. Only one practitioner may furnish and bill the service in a calendar month. 5. The patient may stop CCM services at any time (effective at the end of the month) by phone call to the office staff.  Patient agreed to services and verbal consent obtained.   The patient verbalized understanding of instructions provided today and agreed to receive a mailed copy of patient instruction and/or educational materials. Telephone follow up appointment with pharmacy team member scheduled for: November 2021  Sherre Poot, PharmD Clinical Pharmacist [place] [phone] (office) [phone] (mobile)  Exercises To Do While Sitting  Exercises that you do while sitting (chair exercises) can give you many of the same benefits as full exercise. Benefits include strengthening your heart, burning calories, and keeping muscles and joints healthy. Exercise can also  improve your mood and help with depression and anxiety. You may benefit from chair exercises if you are unable to do standing exercises because of:  Diabetic foot  pain.  Obesity.  Illness.  Arthritis.  Recovery from surgery or injury.  Breathing problems.  Balance problems.  Another type of disability. Before starting chair exercises, check with your health care provider or a physical therapist to find out how much exercise you can tolerate and which exercises are safe for you. If your health care provider approves:  Start out slowly and build up over time. Aim to work up to about 10-20 minutes for each exercise session.  Make exercise part of your daily routine.  Drink water when you exercise. Do not wait until you are thirsty. Drink every 10-15 minutes.  Stop exercising right away if you have pain, nausea, shortness of breath, or dizziness.  If you are exercising in a wheelchair, make sure to lock the wheels.  Ask your health care provider whether you can do tai chi or yoga. Many positions in these mind-body exercises can be modified to do while seated. Warm-up Before starting other exercises: 1. Sit up as straight as you can. Have your knees bent at 90 degrees, which is the shape of the capital letter "L." Keep your feet flat on the floor. 2. Sit at the front edge of your chair, if you can. 3. Pull in (tighten) the muscles in your abdomen and stretch your spine and neck as straight as you can. Hold this position for a few minutes. 4. Breathe in and out evenly. Try to concentrate on your breathing, and relax your mind. Stretching Exercise A: Arm stretch 1. Hold your arms out straight in front of your body. 2. Bend your hands at the wrist with your fingers pointing up, as if signaling someone to stop. Notice the slight tension in your forearms as you hold the position. 3. Keeping your arms out and your hands bent, rotate your hands outward as far as you can and hold this stretch. Aim to have your thumbs pointing up and your pinkie fingers pointing down. Slowly repeat arm stretches for one minute as tolerated. Exercise B: Leg  stretch 1. If you can move your legs, try to "draw" letters on the floor with the toes of your foot. Write your name with one foot. 2. Write your name with the toes of your other foot. Slowly repeat the movements for one minute as tolerated. Exercise C: Reach for the sky 1. Reach your hands as far over your head as you can to stretch your spine. 2. Move your hands and arms as if you are climbing a rope. Slowly repeat the movements for one minute as tolerated. Range of motion exercises Exercise A: Shoulder roll 1. Let your arms hang loosely at your sides. 2. Lift just your shoulders up toward your ears, then let them relax back down. 3. When your shoulders feel loose, rotate your shoulders in backward and forward circles. Do shoulder rolls slowly for one minute as tolerated. Exercise B: March in place 1. As if you are marching, pump your arms and lift your legs up and down. Lift your knees as high as you can. ? If you are unable to lift your knees, just pump your arms and move your ankles and feet up and down. March in place for one minute as tolerated. Exercise C: Seated jumping jacks 1. Let your arms hang down straight. 2. Keeping your arms straight, lift them  up over your head. Aim to point your fingers to the ceiling. 3. While you lift your arms, straighten your legs and slide your heels along the floor to your sides, as wide as you can. 4. As you bring your arms back down to your sides, slide your legs back together. ? If you are unable to use your legs, just move your arms. Slowly repeat seated jumping jacks for one minute as tolerated. Strengthening exercises Exercise A: Shoulder squeeze 1. Hold your arms straight out from your body to your sides, with your elbows bent and your fists pointed at the ceiling. 2. Keeping your arms in the bent position, move them forward so your elbows and forearms meet in front of your face. 3. Open your arms back out as wide as you can with your  elbows still bent, until you feel your shoulder blades squeezing together. Hold for 5 seconds. Slowly repeat the movements forward and backward for one minute as tolerated. Contact a health care provider if you:  Had to stop exercising due to any of the following: ? Pain. ? Nausea. ? Shortness of breath. ? Dizziness. ? Fatigue.  Have significant pain or soreness after exercising. Get help right away if you have:  Chest pain.  Difficulty breathing. These symptoms may represent a serious problem that is an emergency. Do not wait to see if the symptoms will go away. Get medical help right away. Call your local emergency services (911 in the [place] Do not drive yourself to the hospital. This information is not intended to replace advice given to you by your health care provider. Make sure you discuss any questions you have with your health care provider. Document Revised: 06/23/2018 Document Reviewed: 01/13/2017 Elsevier Patient Education  [employer].  Exercises To Do While Sitting  Exercises that you do while sitting (chair exercises) can give you many of the same benefits as full exercise. Benefits include strengthening your heart, burning calories, and keeping muscles and joints healthy. Exercise can also improve your mood and help with depression and anxiety. You may benefit from chair exercises if you are unable to do standing exercises because of:  Diabetic foot pain.  Obesity.  Illness.  Arthritis.  Recovery from surgery or injury.  Breathing problems.  Balance problems.  Another type of disability. Before starting chair exercises, check with your health care provider or a physical therapist to find out how much exercise you can tolerate and which exercises are safe for you. If your health care provider approves:  Start out slowly and build up over time. Aim to work up to about 10-20 minutes for each exercise session.  Make exercise part of your daily  routine.  Drink water when you exercise. Do not wait until you are thirsty. Drink every 10-15 minutes.  Stop exercising right away if you have pain, nausea, shortness of breath, or dizziness.  If you are exercising in a wheelchair, make sure to lock the wheels.  Ask your health care provider whether you can do tai chi or yoga. Many positions in these mind-body exercises can be modified to do while seated. Warm-up Before starting other exercises: 5. Sit up as straight as you can. Have your knees bent at 90 degrees, which is the shape of the capital letter "L." Keep your feet flat on the floor. 6. Sit at the front edge of your chair, if you can. 7. Pull in (tighten) the muscles in your abdomen and stretch your spine and neck as  straight as you can. Hold this position for a few minutes. 8. Breathe in and out evenly. Try to concentrate on your breathing, and relax your mind. Stretching Exercise A: Arm stretch 4. Hold your arms out straight in front of your body. 5. Bend your hands at the wrist with your fingers pointing up, as if signaling someone to stop. Notice the slight tension in your forearms as you hold the position. 6. Keeping your arms out and your hands bent, rotate your hands outward as far as you can and hold this stretch. Aim to have your thumbs pointing up and your pinkie fingers pointing down. Slowly repeat arm stretches for one minute as tolerated. Exercise B: Leg stretch 3. If you can move your legs, try to "draw" letters on the floor with the toes of your foot. Write your name with one foot. 4. Write your name with the toes of your other foot. Slowly repeat the movements for one minute as tolerated. Exercise C: Reach for the sky 3. Reach your hands as far over your head as you can to stretch your spine. 4. Move your hands and arms as if you are climbing a rope. Slowly repeat the movements for one minute as tolerated. Range of motion exercises Exercise A: Shoulder  roll 4. Let your arms hang loosely at your sides. 5. Lift just your shoulders up toward your ears, then let them relax back down. 6. When your shoulders feel loose, rotate your shoulders in backward and forward circles. Do shoulder rolls slowly for one minute as tolerated. Exercise B: March in place 2. As if you are marching, pump your arms and lift your legs up and down. Lift your knees as high as you can. ? If you are unable to lift your knees, just pump your arms and move your ankles and feet up and down. March in place for one minute as tolerated. Exercise C: Seated jumping jacks 5. Let your arms hang down straight. 6. Keeping your arms straight, lift them up over your head. Aim to point your fingers to the ceiling. 7. While you lift your arms, straighten your legs and slide your heels along the floor to your sides, as wide as you can. 8. As you bring your arms back down to your sides, slide your legs back together. ? If you are unable to use your legs, just move your arms. Slowly repeat seated jumping jacks for one minute as tolerated. Strengthening exercises Exercise A: Shoulder squeeze 4. Hold your arms straight out from your body to your sides, with your elbows bent and your fists pointed at the ceiling. 5. Keeping your arms in the bent position, move them forward so your elbows and forearms meet in front of your face. 6. Open your arms back out as wide as you can with your elbows still bent, until you feel your shoulder blades squeezing together. Hold for 5 seconds. Slowly repeat the movements forward and backward for one minute as tolerated. Contact a health care provider if you:  Had to stop exercising due to any of the following: ? Pain. ? Nausea. ? Shortness of breath. ? Dizziness. ? Fatigue.  Have significant pain or soreness after exercising. Get help right away if you have:  Chest pain.  Difficulty breathing. These symptoms may represent a serious problem that is  an emergency. Do not wait to see if the symptoms will go away. Get medical help right away. Call your local emergency services (911 in the [place] Do  not drive yourself to the hospital. This information is not intended to replace advice given to you by your health care provider. Make sure you discuss any questions you have with your health care provider. Document Revised: 06/23/2018 Document Reviewed: 01/13/2017 Elsevier Patient Education  2020 [employer].

## 2019-08-15 ENCOUNTER — Other Ambulatory Visit: Payer: Self-pay

## 2019-08-15 DIAGNOSIS — I10 Essential (primary) hypertension: Secondary | ICD-10-CM

## 2019-08-15 DIAGNOSIS — E785 Hyperlipidemia, unspecified: Secondary | ICD-10-CM

## 2019-08-15 NOTE — Progress Notes (Signed)
Appt was on 08/07/2019 with Sherre Poot, PharmD.

## 2019-08-22 ENCOUNTER — Other Ambulatory Visit: Payer: Self-pay

## 2019-08-22 ENCOUNTER — Ambulatory Visit: Payer: Medicare HMO

## 2019-08-22 DIAGNOSIS — E039 Hypothyroidism, unspecified: Secondary | ICD-10-CM | POA: Diagnosis not present

## 2019-08-23 LAB — TSH: TSH: 5.24 u[IU]/mL — ABNORMAL HIGH (ref 0.450–4.500)

## 2019-08-28 ENCOUNTER — Other Ambulatory Visit: Payer: Self-pay

## 2019-08-28 MED ORDER — LEVOTHYROXINE SODIUM 112 MCG PO TABS
112.0000 ug | ORAL_TABLET | Freq: Every day | ORAL | 3 refills | Status: DC
Start: 2019-08-28 — End: 2019-08-31

## 2019-08-31 ENCOUNTER — Other Ambulatory Visit: Payer: Self-pay

## 2019-08-31 MED ORDER — LEVOTHYROXINE SODIUM 112 MCG PO TABS: 112.0000 ug | ORAL_TABLET | Freq: Every day | ORAL | 0 refills | Status: DC

## 2019-09-07 ENCOUNTER — Other Ambulatory Visit: Payer: Self-pay | Admitting: Family Medicine

## 2019-09-13 ENCOUNTER — Other Ambulatory Visit: Payer: Self-pay

## 2019-09-13 ENCOUNTER — Encounter (HOSPITAL_COMMUNITY): Payer: Self-pay | Admitting: Pediatrics

## 2019-09-13 ENCOUNTER — Emergency Department (HOSPITAL_COMMUNITY): Payer: Medicare HMO

## 2019-09-13 ENCOUNTER — Encounter: Payer: Self-pay | Admitting: Legal Medicine

## 2019-09-13 ENCOUNTER — Ambulatory Visit (INDEPENDENT_AMBULATORY_CARE_PROVIDER_SITE_OTHER): Payer: Medicare HMO | Admitting: Legal Medicine

## 2019-09-13 ENCOUNTER — Inpatient Hospital Stay (HOSPITAL_COMMUNITY)
Admission: EM | Admit: 2019-09-13 | Discharge: 2019-09-16 | DRG: 246 | Disposition: A | Discharge status: Home Health | Payer: Medicare HMO | Source: Ambulatory Visit | Attending: Internal Medicine | Admitting: Internal Medicine

## 2019-09-13 VITALS — BP 150/90 | HR 115 | Temp 97.7°F | Resp 20 | Ht 70.0 in | Wt 149.3 lb

## 2019-09-13 DIAGNOSIS — Z23 Encounter for immunization: Secondary | ICD-10-CM

## 2019-09-13 DIAGNOSIS — R0600 Dyspnea, unspecified: Secondary | ICD-10-CM

## 2019-09-13 DIAGNOSIS — Z79890 Hormone replacement therapy (postmenopausal): Secondary | ICD-10-CM | POA: Diagnosis not present

## 2019-09-13 DIAGNOSIS — I259 Chronic ischemic heart disease, unspecified: Secondary | ICD-10-CM | POA: Insufficient documentation

## 2019-09-13 DIAGNOSIS — Z7902 Long term (current) use of antithrombotics/antiplatelets: Secondary | ICD-10-CM

## 2019-09-13 DIAGNOSIS — J439 Emphysema, unspecified: Secondary | ICD-10-CM | POA: Diagnosis present

## 2019-09-13 DIAGNOSIS — Z8249 Family history of ischemic heart disease and other diseases of the circulatory system: Secondary | ICD-10-CM | POA: Diagnosis not present

## 2019-09-13 DIAGNOSIS — J449 Chronic obstructive pulmonary disease, unspecified: Secondary | ICD-10-CM | POA: Diagnosis not present

## 2019-09-13 DIAGNOSIS — R0602 Shortness of breath: Secondary | ICD-10-CM

## 2019-09-13 DIAGNOSIS — Z87891 Personal history of nicotine dependence: Secondary | ICD-10-CM

## 2019-09-13 DIAGNOSIS — I6529 Occlusion and stenosis of unspecified carotid artery: Secondary | ICD-10-CM | POA: Diagnosis present

## 2019-09-13 DIAGNOSIS — I739 Peripheral vascular disease, unspecified: Secondary | ICD-10-CM | POA: Diagnosis present

## 2019-09-13 DIAGNOSIS — E039 Hypothyroidism, unspecified: Secondary | ICD-10-CM | POA: Diagnosis present

## 2019-09-13 DIAGNOSIS — I25110 Atherosclerotic heart disease of native coronary artery with unstable angina pectoris: Secondary | ICD-10-CM | POA: Diagnosis present

## 2019-09-13 DIAGNOSIS — I10 Essential (primary) hypertension: Secondary | ICD-10-CM | POA: Diagnosis not present

## 2019-09-13 DIAGNOSIS — Z85820 Personal history of malignant melanoma of skin: Secondary | ICD-10-CM | POA: Diagnosis not present

## 2019-09-13 DIAGNOSIS — Z955 Presence of coronary angioplasty implant and graft: Secondary | ICD-10-CM

## 2019-09-13 DIAGNOSIS — Z79899 Other long term (current) drug therapy: Secondary | ICD-10-CM | POA: Diagnosis not present

## 2019-09-13 DIAGNOSIS — I5041 Acute combined systolic (congestive) and diastolic (congestive) heart failure: Secondary | ICD-10-CM | POA: Diagnosis present

## 2019-09-13 DIAGNOSIS — E785 Hyperlipidemia, unspecified: Secondary | ICD-10-CM | POA: Diagnosis present

## 2019-09-13 DIAGNOSIS — I110 Hypertensive heart disease with heart failure: Secondary | ICD-10-CM | POA: Diagnosis present

## 2019-09-13 DIAGNOSIS — F329 Major depressive disorder, single episode, unspecified: Secondary | ICD-10-CM | POA: Diagnosis present

## 2019-09-13 DIAGNOSIS — Z95828 Presence of other vascular implants and grafts: Secondary | ICD-10-CM

## 2019-09-13 DIAGNOSIS — R0789 Other chest pain: Secondary | ICD-10-CM | POA: Diagnosis not present

## 2019-09-13 DIAGNOSIS — R072 Precordial pain: Secondary | ICD-10-CM

## 2019-09-13 DIAGNOSIS — R9431 Abnormal electrocardiogram [ECG] [EKG]: Secondary | ICD-10-CM | POA: Diagnosis not present

## 2019-09-13 DIAGNOSIS — R0609 Other forms of dyspnea: Secondary | ICD-10-CM

## 2019-09-13 DIAGNOSIS — Z8673 Personal history of transient ischemic attack (TIA), and cerebral infarction without residual deficits: Secondary | ICD-10-CM | POA: Diagnosis not present

## 2019-09-13 DIAGNOSIS — F419 Anxiety disorder, unspecified: Secondary | ICD-10-CM | POA: Diagnosis present

## 2019-09-13 DIAGNOSIS — Z7982 Long term (current) use of aspirin: Secondary | ICD-10-CM

## 2019-09-13 DIAGNOSIS — I2584 Coronary atherosclerosis due to calcified coronary lesion: Secondary | ICD-10-CM | POA: Diagnosis present

## 2019-09-13 DIAGNOSIS — I200 Unstable angina: Secondary | ICD-10-CM | POA: Diagnosis not present

## 2019-09-13 DIAGNOSIS — Z885 Allergy status to narcotic agent status: Secondary | ICD-10-CM

## 2019-09-13 DIAGNOSIS — I255 Ischemic cardiomyopathy: Secondary | ICD-10-CM | POA: Diagnosis present

## 2019-09-13 DIAGNOSIS — R0603 Acute respiratory distress: Secondary | ICD-10-CM

## 2019-09-13 DIAGNOSIS — Z8616 Personal history of COVID-19: Secondary | ICD-10-CM

## 2019-09-13 DIAGNOSIS — Z20822 Contact with and (suspected) exposure to covid-19: Secondary | ICD-10-CM | POA: Diagnosis not present

## 2019-09-13 DIAGNOSIS — R079 Chest pain, unspecified: Secondary | ICD-10-CM

## 2019-09-13 DIAGNOSIS — I25119 Atherosclerotic heart disease of native coronary artery with unspecified angina pectoris: Secondary | ICD-10-CM | POA: Diagnosis present

## 2019-09-13 DIAGNOSIS — R0902 Hypoxemia: Secondary | ICD-10-CM | POA: Diagnosis not present

## 2019-09-13 DIAGNOSIS — I5031 Acute diastolic (congestive) heart failure: Secondary | ICD-10-CM | POA: Diagnosis not present

## 2019-09-13 HISTORY — DX: Other forms of dyspnea: R06.09

## 2019-09-13 HISTORY — DX: Chronic ischemic heart disease, unspecified: I25.9

## 2019-09-13 HISTORY — DX: Dyspnea, unspecified: R06.00

## 2019-09-13 LAB — SARS CORONAVIRUS 2 BY RT PCR (HOSPITAL ORDER, PERFORMED IN ~~LOC~~ HOSPITAL LAB): SARS Coronavirus 2: NEGATIVE

## 2019-09-13 LAB — CBC
HCT: 46 % (ref 39.0–52.0)
Hemoglobin: 15.1 g/dL (ref 13.0–17.0)
MCH: 32.9 pg (ref 26.0–34.0)
MCHC: 32.8 g/dL (ref 30.0–36.0)
MCV: 100.2 fL — ABNORMAL HIGH (ref 80.0–100.0)
Platelets: 302 10*3/uL (ref 150–400)
RBC: 4.59 MIL/uL (ref 4.22–5.81)
RDW: 15.1 % (ref 11.5–15.5)
WBC: 9.7 10*3/uL (ref 4.0–10.5)
nRBC: 0 % (ref 0.0–0.2)

## 2019-09-13 LAB — BASIC METABOLIC PANEL
Anion gap: 11 (ref 5–15)
BUN: 9 mg/dL (ref 8–23)
CO2: 22 mmol/L (ref 22–32)
Calcium: 9.2 mg/dL (ref 8.9–10.3)
Chloride: 104 mmol/L (ref 98–111)
Creatinine, Ser: 1.08 mg/dL (ref 0.61–1.24)
GFR calc Af Amer: >60 mL/min (ref >60)
GFR calc non Af Amer: >60 mL/min (ref >60)
Glucose, Bld: 107 mg/dL — ABNORMAL HIGH (ref 70–99)
Potassium: 4 mmol/L (ref 3.5–5.1)
Sodium: 137 mmol/L (ref 135–145)

## 2019-09-13 LAB — TROPONIN I (HIGH SENSITIVITY)
Troponin I (High Sensitivity): 12 ng/L (ref <18)
Troponin I (High Sensitivity): 13 ng/L (ref <18)

## 2019-09-13 LAB — D-DIMER, QUANTITATIVE: D-Dimer, Quant: 7.55 ug/mL-FEU — ABNORMAL HIGH (ref 0.00–0.50)

## 2019-09-13 LAB — BRAIN NATRIURETIC PEPTIDE: B Natriuretic Peptide: 235.6 pg/mL — ABNORMAL HIGH (ref 0.0–100.0)

## 2019-09-13 MED ORDER — ASPIRIN EC 81 MG PO TBEC
81.0000 mg | DELAYED_RELEASE_TABLET | Freq: Every day | ORAL | Status: DC
  Administered 2019-09-14 – 2019-09-15 (×2): 81 mg via ORAL
  Filled 2019-09-13 (×2): qty 1

## 2019-09-13 MED ORDER — CLONIDINE HCL 0.1 MG PO TABS
0.1000 mg | ORAL_TABLET | Freq: Two times a day (BID) | ORAL | Status: DC
  Administered 2019-09-14 (×3): 0.1 mg via ORAL
  Filled 2019-09-13 (×3): qty 1

## 2019-09-13 MED ORDER — ACETAMINOPHEN 325 MG PO TABS: 650.0000 mg | ORAL_TABLET | ORAL | Status: DC | PRN

## 2019-09-13 MED ORDER — NITROGLYCERIN 0.4 MG SL SUBL: 0.4000 mg | SUBLINGUAL_TABLET | SUBLINGUAL | Status: DC | PRN

## 2019-09-13 MED ORDER — IOHEXOL 350 MG/ML SOLN
75.0000 mL | Freq: Once | INTRAVENOUS | Status: AC | PRN
  Administered 2019-09-13: 75 mL via INTRAVENOUS

## 2019-09-13 MED ORDER — POTASSIUM CHLORIDE CRYS ER 10 MEQ PO TBCR
10.0000 meq | EXTENDED_RELEASE_TABLET | Freq: Two times a day (BID) | ORAL | Status: DC
  Administered 2019-09-14 – 2019-09-16 (×6): 10 meq via ORAL
  Filled 2019-09-13 (×6): qty 1

## 2019-09-13 MED ORDER — LEVOTHYROXINE SODIUM 112 MCG PO TABS
112.0000 ug | ORAL_TABLET | Freq: Every day | ORAL | Status: DC
  Administered 2019-09-14 – 2019-09-16 (×3): 112 ug via ORAL
  Filled 2019-09-13 (×3): qty 1

## 2019-09-13 MED ORDER — FUROSEMIDE 10 MG/ML IJ SOLN
20.0000 mg | Freq: Once | INTRAMUSCULAR | Status: AC
  Administered 2019-09-14: 20 mg via INTRAVENOUS
  Filled 2019-09-13: qty 2

## 2019-09-13 MED ORDER — ENOXAPARIN SODIUM 40 MG/0.4ML ~~LOC~~ SOLN: 40.0000 mg | Freq: Every day | SUBCUTANEOUS | Status: DC

## 2019-09-13 MED ORDER — ONDANSETRON HCL 4 MG/2ML IJ SOLN: 4.0000 mg | Freq: Four times a day (QID) | INTRAMUSCULAR | Status: DC | PRN

## 2019-09-13 MED ORDER — ATORVASTATIN CALCIUM 80 MG PO TABS
80.0000 mg | ORAL_TABLET | Freq: Every day | ORAL | Status: DC
  Administered 2019-09-14 – 2019-09-16 (×3): 80 mg via ORAL
  Filled 2019-09-13 (×3): qty 1

## 2019-09-13 MED ORDER — CILOSTAZOL 100 MG PO TABS
50.0000 mg | ORAL_TABLET | Freq: Two times a day (BID) | ORAL | Status: DC
  Administered 2019-09-14 – 2019-09-16 (×5): 50 mg via ORAL
  Filled 2019-09-13 (×7): qty 1

## 2019-09-13 MED ORDER — CLOPIDOGREL BISULFATE 75 MG PO TABS
75.0000 mg | ORAL_TABLET | Freq: Every day | ORAL | Status: DC
  Administered 2019-09-14 – 2019-09-16 (×3): 75 mg via ORAL
  Filled 2019-09-13 (×3): qty 1

## 2019-09-13 MED ORDER — PAROXETINE HCL 20 MG PO TABS
20.0000 mg | ORAL_TABLET | Freq: Every day | ORAL | Status: DC
  Administered 2019-09-15 – 2019-09-16 (×2): 20 mg via ORAL
  Filled 2019-09-13 (×3): qty 1

## 2019-09-13 MED ORDER — FENOFIBRATE 160 MG PO TABS
160.0000 mg | ORAL_TABLET | Freq: Every day | ORAL | Status: DC
  Administered 2019-09-15 – 2019-09-16 (×2): 160 mg via ORAL
  Filled 2019-09-13 (×3): qty 1

## 2019-09-13 MED ORDER — SODIUM CHLORIDE 0.9% FLUSH
3.0000 mL | Freq: Once | INTRAVENOUS | Status: AC
  Administered 2019-09-15: 3 mL via INTRAVENOUS

## 2019-09-13 NOTE — ED Provider Notes (Signed)
[facility] EMERGENCY DEPARTMENT Provider Note   CSN: [id number] Arrival date & time: 09/13/19  1149     History Chief Complaint  Patient presents with  . Chest Pain    Earl Mueller is a 84 y.o. male with past medical history of CAD, PVD, CVA, dyslipidemia, hypertension, presenting to the emergency department from PCP office for shortness of breath and chest pain.  Per PCP documentation, he was found to be hypoxic at 88% on arrival, and placed on 3 L with improvement.  He went to his PCP for 1 month of progressively worsening shortness of breath.  He has dyspnea on exertion and orthopnea.  When he exerts himself he also has a central burning chest pain.  Both chest pain and shortness of breath are relieved with rest.  He also takes an Alka-Seltzer which relieves his chest pain.  He did not take nitroglycerin for the symptoms.  He has had cough as well.  He endorses about 10 pound weight gain over the course of a few months.  Per PCP note, he had abnormal EKG as well. Per chart review, patient had catheterization in 2018 with multivessel CAD.  No known history of heart failure.  Prior history of tobacco use.  The history is provided by the patient and medical records.       Past Medical History:  Diagnosis Date  . Anxiety   . Arthritis   . Cancer ([place])    skin  . Carotid artery stenosis 03/26/2017  . Carotid bruit 11/25/2016  . Coronary artery disease involving native coronary artery of native heart without angina pectoris 11/20/2014  . Dyslipidemia 11/20/2014  . Essential hypertension 11/20/2014  . History of stroke 11/20/2014  . Hypertension    dr Lennox Solders  . Hypothyroidism   . Peripheral vascular disease ([place])   . Pre-operative clearance 01/18/2017  . Skin ulcer of scalp ([place]) 05/10/2013  . 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 Symptomatic carotid artery stenosis 03/05/2017    Patient Active Problem List   Diagnosis Date Noted  .  Ischemic heart disease 09/13/2019  . Stroke ([place])   . Carotid artery stenosis 03/26/2017  . Symptomatic carotid artery stenosis 03/05/2017  . Pre-operative clearance 01/18/2017  . Carotid bruit 11/25/2016  . Coronary artery disease involving native coronary artery of native heart without angina pectoris 11/20/2014  . Dyslipidemia 11/20/2014  . Essential hypertension 11/20/2014  . Peripheral vascular disease ([place]) 11/20/2014  . History of stroke 11/20/2014  . Skin ulcer of scalp ([place]) 05/10/2013    Past Surgical History:  Procedure Laterality Date  . BACK SURGERY    . CARDIAC CATHETERIZATION  03/06/10   NL-LM, 40%pLAD, 30% mid/distal LAD, 98% ostial DIAG (small), 30%pCx, 50%mCx, 30%dCx, 30% OM2, 40%dRCA; Med RX rec ([facility])  . caritid endart    . ENDARTERECTOMY Left 03/26/2017   Procedure: ENDARTERECTOMY CAROTID LEFT;  Surgeon: Rosetta Posner, MD;  Location: [facility] OR;  Service: Vascular;  Laterality: Left;  . femerao bypass    . LEFT HEART CATH AND CORONARY ANGIOGRAPHY N/A 02/03/2017   Procedure: LEFT HEART CATH AND CORONARY ANGIOGRAPHY;  Surgeon: Martinique, Peter M, MD;  Location: [facility] CV LAB;  Service: Cardiovascular;  Laterality: N/A;  . PATCH ANGIOPLASTY Left 03/26/2017   Procedure: PATCH ANGIOPLASTY LEFT CAROTID ARTERY;  Surgeon: Rosetta Posner, MD;  Location: [facility];  Service: Vascular;  Laterality: Left;  . SCALP LACERATION REPAIR N/A  05/23/2013   Procedure: EXCISION SCALP ULCER DEBRIDEMENT OF SKIN AND BONE WITH PLACEMENT OF A-CELL;  Surgeon: Irene Limbo, MD;  Location: [place] [facility];  Service: Plastics;  Laterality: N/A;  . STOMACH SURGERY     x2  . VASCULAR SURGERY     [facility] 10/15/05       Family History  Problem Relation Age of Onset  . Heart failure Father     Social History   Tobacco Use  . Smoking status: Former Smoker    Quit date: 12/18/2007    Years since quitting: 11.7  . Smokeless tobacco: Never Used  Vaping Use  . Vaping Use: Never used  Substance Use Topics  .  Alcohol use: Yes    Comment: occ  . Drug use: No    Home Medications Prior to Admission medications   Medication Sig Start Date End Date Taking? Authorizing Provider  Acetaminophen 500 MG coapsule Take 1,000 mg by mouth every 4 (four) hours as needed for fever or pain.  08/20/17  Yes [provider]  aspirin EC 81 MG tablet Take 81 mg by mouth daily.   Yes [provider]  atorvastatin (LIPITOR) 80 MG tablet TAKE 1 TABLET EVERY DAY Patient taking differently: Take 80 mg by mouth daily.  09/07/19  Yes Marge Duncans, PA-C  cilostazol (PLETAL) 100 MG tablet Take 1 tablet (100 mg total) by mouth 2 (two) times daily. Patient taking differently: Take 50 mg by mouth 2 (two) times daily.  06/13/19  Yes Cox, Kirsten, MD  cloNIDine (CATAPRES) 0.1 MG tablet TAKE 1 TABLET TWICE DAILY 09/07/19  Yes Marge Duncans, PA-C  clopidogrel (PLAVIX) 75 MG tablet TAKE 1 TABLET EVERY DAY Patient taking differently: Take 75 mg by mouth daily.  05/02/19  Yes Cox, Kirsten, MD  fenofibrate 160 MG tablet Take 160 mg by mouth daily.   Yes [provider]  levothyroxine (SYNTHROID) 112 MCG tablet Take 1 tablet (112 mcg total) by mouth daily. 08/31/19  Yes Cox, Kirsten, MD  Multiple Vitamins-Minerals (CENTRUM SILVER PO) Take 1 tablet by mouth daily.   Yes [provider]  nitroGLYCERIN (NITROSTAT) 0.4 MG SL tablet Place 0.4 mg under the tongue every 5 (five) minutes as needed for chest pain.   Yes [provider]  PARoxetine (PAXIL) 20 MG tablet Take 20 mg by mouth daily.   Yes [provider]  potassium chloride (KLOR-CON) 10 MEQ tablet TAKE 3 TABLETS TWICE DAILY Patient taking differently: Take 10 mEq by mouth 2 (two) times daily.  06/04/19  Yes Cox, Kirsten, MD    Allergies    Other  Review of Systems   Review of Systems  Constitutional: Positive for unexpected weight change.  Respiratory: Positive for cough and shortness of breath.   Cardiovascular: Positive for chest  pain. Negative for leg swelling.  All other systems reviewed and are negative.   Physical Exam Updated Vital Signs BP 140/64 (BP Location: Left Arm)   Pulse 95   Temp 98 F (36.7 C) (Oral)   Resp 14   Ht 5\' 10"  (1.778 m)   Wt 67.8 kg   SpO2 94%   BMI 21.44 kg/m   Physical Exam Vitals and nursing note reviewed.  Constitutional:      General: He is not in acute distress.    Appearance: He is well-developed.  HENT:     Head: Normocephalic and atraumatic.  Eyes:     Conjunctiva/sclera: Conjunctivae normal.  Cardiovascular:     Rate and Rhythm:  Normal rate and regular rhythm.     Heart sounds: Murmur heard.  Crescendo systolic (harsh/high-pitched, best heard left upper sternal border) murmur is present.   Pulmonary:     Effort: Pulmonary effort is normal.     Breath sounds: Examination of the right-lower field reveals rales. Examination of the left-lower field reveals rales. Rales present.  Abdominal:     General: Bowel sounds are normal.     Palpations: Abdomen is soft.     Tenderness: There is no abdominal tenderness.  Musculoskeletal:     Right lower leg: No edema.     Left lower leg: No edema.  Skin:    General: Skin is warm.  Neurological:     Mental Status: He is alert.  Psychiatric:        Behavior: Behavior normal.     ED Results / Procedures / Treatments   Labs (all labs ordered are listed, but only abnormal results are displayed) Labs Reviewed  BASIC METABOLIC PANEL - Abnormal; Notable for the following components:      Result Value   Glucose, Bld 107 (*)    All other components within normal limits  CBC - Abnormal; Notable for the following components:   MCV 100.2 (*)    All other components within normal limits  BRAIN NATRIURETIC PEPTIDE - Abnormal; Notable for the following components:   B Natriuretic Peptide 235.6 (*)    All other components within normal limits  SARS CORONAVIRUS 2 BY RT PCR (HOSPITAL ORDER, [place] [facility]  LAB)  D-DIMER, QUANTITATIVE (NOT AT [facility])  TROPONIN I (HIGH SENSITIVITY)  TROPONIN I (HIGH SENSITIVITY)    EKG EKG Interpretation  Date/Time:  Wednesday September 13 2019 12:11:00 EDT Ventricular Rate:  88 PR Interval:  134 QRS Duration: 84 QT Interval:  394 QTC Calculation: 476 R Axis:   10 Text Interpretation: Normal sinus rhythm Nonspecific ST abnormality Abnormal ECG Confirmed by Quintella Reichert [phone]) on 09/13/2019 5:47:23 PM   Radiology DG Chest 2 View  Result Date: 09/13/2019 CLINICAL DATA:  Chest pain EXAM: CHEST - 2 VIEW COMPARISON:  2018 FINDINGS: Emphysema with superimposed prominent interstitial changes. This appears slightly increased compared to 2018. No pleural effusion. No pneumothorax. Normal heart size. No acute osseous abnormality. IMPRESSION: Emphysema with slight increase in superimposed prominent interstitial changes. This may reflect progression of underlying interstitial lung disease or superimposed mild edema. Electronically Signed   By: Macy Mis M.D.   On: 09/13/2019 12:36    Procedures Procedures (including critical care time)  Medications Ordered in ED Medications  sodium chloride flush (NS) 0.9 % injection 3 mL (has no administration in time range)    ED Course  I have reviewed the triage vital signs and the nursing notes.  Pertinent labs & imaging results that were available during my care of the patient were reviewed by me and considered in my medical decision making (see chart for details).    MDM Rules/Calculators/A&P                          Pt presenting with 1 month of gradually worsening SOB and CP on exertion. Symptoms are relieved with rest. He was seen at PCP today with abnormal EKG and hypoxia at 88%. He is not having active pain while in the ED. Fine crackles in the bases, new systolic murmur present. No LE edema. BNP slightly elevated. Troponin 12 and 13. Concern for anginal cause of pt's  symptoms and new hypoxia? Suspect hypoxia  is 2/t to chronic respiratory cause. Believe patient needs admission for cardiac workup.   Pt discussed with and evaluated by Dr. Ralene Bathe, agrees with plan.  Discussed with Dr. Hal Hope, regarding hypoxia d-dimer ordered, accepting admission. Final Clinical Impression(s) / ED Diagnoses Final diagnoses:  Hypoxia  Precordial chest pain  SOB (shortness of breath)    Rx / DC Orders ED Discharge Orders    None       Axxel Gude, [place] N, PA-C 09/13/19 2035    Quintella Reichert, MD 09/18/19 [phone]

## 2019-09-13 NOTE — ED Triage Notes (Signed)
Patient arrived from doctor's office. Concern for abnormal EKG; EMS endorsed patient c/o ongoing chest burning and shortness of breath for the last 2 weeks, worst with movement. Patient denies chest pain. Reported given NTG at the doctor's office, however do not have chest pain.

## 2019-09-13 NOTE — Progress Notes (Signed)
Subjective:  Patient ID: Earl Mueller, male    DOB: 08/15/1935  Age: 84 y.o. MRN: [id number]  Chief Complaint  Patient presents with  . Cough  . Shortness of Breath    HPI: SOB and cough fr one month.  Cannot walk to kitchen.  No pedal edema, He has chest burning did not use nitroglycerine. He has gained 10 lbs in last month   Current Outpatient Medications on File Prior to Visit  Medication Sig Dispense Refill  . Acetaminophen 500 MG coapsule Take by mouth every 4 (four) hours as needed.     Marland Kitchen aspirin EC 81 MG tablet Take 81 mg by mouth daily.    Marland Kitchen atorvastatin (LIPITOR) 80 MG tablet TAKE 1 TABLET EVERY DAY 90 tablet 0  . chlorthalidone (HYGROTON) 25 MG tablet Take 25 mg by mouth daily.     . cilostazol (PLETAL) 100 MG tablet Take 1 tablet (100 mg total) by mouth 2 (two) times daily. 180 tablet 0  . cloNIDine (CATAPRES) 0.1 MG tablet TAKE 1 TABLET TWICE DAILY 180 tablet 0  . clopidogrel (PLAVIX) 75 MG tablet TAKE 1 TABLET EVERY DAY 90 tablet 3  . fenofibrate 160 MG tablet Take 160 mg by mouth daily.    Marland Kitchen levothyroxine (SYNTHROID) 112 MCG tablet Take 1 tablet (112 mcg total) by mouth daily. 90 tablet 0  . Multiple Vitamins-Minerals (CENTRUM SILVER PO) Take 1 tablet by mouth daily.    . nitroGLYCERIN (NITROSTAT) 0.4 MG SL tablet Place 0.4 mg under the tongue every 5 (five) minutes as needed for chest pain.    Marland Kitchen PARoxetine (PAXIL) 20 MG tablet Take 20 mg by mouth daily.    . potassium chloride (KLOR-CON) 10 MEQ tablet TAKE 3 TABLETS TWICE DAILY 540 tablet 0   No current facility-administered medications on file prior to visit.   Past Medical History:  Diagnosis Date  . Anxiety   . Arthritis   . Cancer ([place])    skin  . Carotid artery stenosis 03/26/2017  . Carotid bruit 11/25/2016  . Coronary artery disease involving native coronary artery of native heart without angina pectoris 11/20/2014  . Dyslipidemia 11/20/2014  . Essential hypertension 11/20/2014  . History of stroke 11/20/2014   . Hypertension    dr Lennox Solders  . Hypothyroidism   . Peripheral vascular disease ([place])   . Pre-operative clearance 01/18/2017  . Skin ulcer of scalp ([employer]) 05/10/2013  . 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 Symptomatic carotid artery stenosis 03/05/2017   Past Surgical History:  Procedure Laterality Date  . BACK SURGERY    . CARDIAC CATHETERIZATION  03/06/10   NL-LM, 40%pLAD, 30% mid/distal LAD, 98% ostial DIAG (small), 30%pCx, 50%mCx, 30%dCx, 30% OM2, 40%dRCA; Med RX rec ([facility])  . caritid endart    . ENDARTERECTOMY Left 03/26/2017   Procedure: ENDARTERECTOMY CAROTID LEFT;  Surgeon: Rosetta Posner, MD;  Location: [facility] OR;  Service: Vascular;  Laterality: Left;  . femerao bypass    . LEFT HEART CATH AND CORONARY ANGIOGRAPHY N/A 02/03/2017   Procedure: LEFT HEART CATH AND CORONARY ANGIOGRAPHY;  Surgeon: Martinique, Peter M, MD;  Location: [place] [facility] LAB;  Service: Cardiovascular;  Laterality: N/A;  . PATCH ANGIOPLASTY Left 03/26/2017   Procedure: PATCH ANGIOPLASTY LEFT CAROTID ARTERY;  Surgeon: Rosetta Posner, MD;  Location: [place];  Service: Vascular;  Laterality: Left;  . SCALP LACERATION REPAIR N/A 05/23/2013   Procedure: EXCISION SCALP ULCER  DEBRIDEMENT OF SKIN AND BONE WITH PLACEMENT OF A-CELL;  Surgeon: Irene Limbo, MD;  Location: [place];  Service: Plastics;  Laterality: N/A;  . STOMACH SURGERY     x2  . VASCULAR SURGERY     AFBG 10/15/05    Family History  Problem Relation Age of Onset  . Heart failure Father    Social History   Socioeconomic History  . Marital status: Married    Spouse name: Not on file  . Number of children: Not on file  . Years of education: Not on file  . Highest education level: Not on file  Occupational History  . Not on file  Tobacco Use  . Smoking status: Former Smoker    Quit date: 12/18/2007    Years since quitting: 11.7  . Smokeless tobacco: Never Used  Vaping Use  . Vaping Use: Never used  Substance and Sexual  Activity  . Alcohol use: Yes    Comment: occ  . Drug use: No  . Sexual activity: Not on file  Other Topics Concern  . Not on file  Social History Narrative  . Not on file   Social Determinants of Health   Financial Resource Strain:   . Difficulty of Paying Living Expenses:   Food Insecurity: No Food Insecurity  . Worried About Charity fundraiser in the Last Year: Never true  . Ran Out of Food in the Last Year: Never true  Transportation Needs: No Transportation Needs  . Lack of Transportation (Medical): No  . Lack of Transportation (Non-Medical): No  Physical Activity: Inactive  . Days of Exercise per Week: 0 days  . Minutes of Exercise per Session: 0 min  Stress:   . Feeling of Stress :   Social Connections:   . Frequency of Communication with Friends and Family:   . Frequency of Social Gatherings with Friends and Family:   . Attends Religious Services:   . Active Member of Clubs or Organizations:   . Attends Archivist Meetings:   Marland Kitchen Marital Status:     Review of Systems  Constitutional: Negative.   HENT: Negative.   Eyes: Negative.   Respiratory: Positive for shortness of breath.   Cardiovascular: Positive for chest pain.  Gastrointestinal: Negative.   Endocrine: Negative.   Genitourinary: Negative.   Musculoskeletal: Negative.   Skin: Negative.   Neurological: Negative.   Psychiatric/Behavioral: Negative.      Objective:  BP (!) 150/90   Pulse (!) 115   Temp 97.7 F (36.5 C)   Resp 20   Ht 5\' 10"  (1.778 m)   Wt 149 lb 4.8 oz (67.7 kg)   SpO2 (!) 88%   BMI 21.42 kg/m   BP/Weight 09/13/2019 07/05/2019 41/11/6220  Systolic BP 979 892 119  Diastolic BP 90 70 60  Wt. (Lbs) 149.3 147.8 137.2  BMI 21.42 20.91 19.69    Physical Exam Constitutional:      General: He is awake.     Appearance: He is overweight. He is ill-appearing.  HENT:     Head: Normocephalic and atraumatic.     Right Ear: Tympanic membrane, ear canal and external ear  normal.     Left Ear: Tympanic membrane, ear canal and external ear normal.     Mouth/Throat:     Mouth: Mucous membranes are dry.  Eyes:     Extraocular Movements: Extraocular movements intact.     Pupils: Pupils are equal, round, and reactive to light.  Neck:  Vascular: Carotid bruit present.  Cardiovascular:     Rate and Rhythm: Normal rate and regular rhythm.     Pulses: Normal pulses.     Heart sounds: Normal heart sounds.     Comments: Grade 2/6 SEM " squeeking" at apex some radiation to left carotid Pulmonary:     Effort: Pulmonary effort is normal.     Breath sounds: Normal breath sounds.  Abdominal:     General: Abdomen is flat. Bowel sounds are normal.     Palpations: Abdomen is soft.  Musculoskeletal:        General: Normal range of motion.  Skin:    General: Skin is warm and dry.     Capillary Refill: Capillary refill takes 2 to 3 seconds.  Neurological:     General: No focal deficit present.     Mental Status: He is alert and oriented to person, place, and time.  Psychiatric:        Behavior: Behavior is cooperative.   EKG: NSR rate 92BPM, PR 195msec, QRS 78msec, QTC 430, axis -4, diffuse anterior ST changes suggestive of myocardial injury/ischemia    Lab Results  Component Value Date   WBC 12.6 (H) 07/05/2019   HGB 15.9 07/05/2019   HCT 47.1 07/05/2019   PLT 324 07/05/2019   GLUCOSE 91 07/05/2019   CHOL 148 07/05/2019   TRIG 67 07/05/2019   HDL 56 07/05/2019   LDLCALC 79 07/05/2019   ALT 25 07/05/2019   AST 35 07/05/2019   NA 137 07/05/2019   K 3.8 07/05/2019   CL 97 07/05/2019   CREATININE 1.35 (H) 07/05/2019   BUN 22 07/05/2019   CO2 25 07/05/2019   TSH 5.240 (H) 08/22/2019   INR 1.08 03/22/2017  He was given 1 NTG 10:30 and 2 ASA, started on O2 2L/min    Assessment & Plan:   1. Acute respiratory distress - DG Chest 2 View Progressive respiratory failure with O2 sat on RA of 88%  2. Ischemic heart disease EKG demonstrated diffuse  anterior ischemia- I discussed with Dr. Agustin Cree and he will be transferred to [facility] cardiac. Via EMS    No orders of the defined types were placed in this encounter.   Orders Placed This Encounter  Procedures  . DG Chest 2 View     Follow-up: No follow-ups on file.  An After Visit Summary was printed and given to the patient.  [place] [phone]

## 2019-09-13 NOTE — ED Triage Notes (Signed)
Per EMS; initial Sp02 was 88 % on arrival to clinic. 3 L supplemental o2 applied and Sp02 improved.

## 2019-09-13 NOTE — ED Notes (Signed)
Called for pt x3 for vital signs with no response  

## 2019-09-13 NOTE — Addendum Note (Signed)
Addended by: Thompson Caul I on: 09/13/2019 10:44 AM   Modules accepted: Orders

## 2019-09-13 NOTE — H&P (Signed)
History and Physical    Earl Mueller JGG:[id number] DOB: 1935-04-11 DOA: 09/13/2019  PCP: Rochel Brome, MD  Patient coming from: Home.  Chief Complaint: Chest pain and shortness of breath.  HPI: Earl Mueller is a 84 y.o. male with history of office CAD managed medically with history of peripheral vascular disease status post aortofemoral bypass, history of carotid endarterectomy, hypertension previous history of cigarette smoking has been experiencing exertional chest pain and shortness of breath for the last 1 month.  Since the frequency and pain has increased patient presents to the ER.  At rest patient is not having any pain.  Denies any fever chills or productive cough.  ED Course: In the ER labs show unremarkable metabolic panel and CBC high sensitive troponins were 12 and 13 with BNP of 235.6.  Covid test was negative.  EKG shows rhythm with nonspecific T changes.  Since patient had elevated D-dimer CT angiogram of the chest was done which showed description edema and mild pleural effusion.  Patient admitted for further management of exertional chest pain and shortness of breath concerning for angina.  Review of Systems: As per HPI, rest all negative.   Past Medical History:  Diagnosis Date  . Anxiety   . Arthritis   . Cancer ([place])    skin  . Carotid artery stenosis 03/26/2017  . Carotid bruit 11/25/2016  . Coronary artery disease involving native coronary artery of native heart without angina pectoris 11/20/2014  . Dyslipidemia 11/20/2014  . Essential hypertension 11/20/2014  . History of stroke 11/20/2014  . Hypertension    dr Lennox Solders  . Hypothyroidism   . Peripheral vascular disease ([place])   . Pre-operative clearance 01/18/2017  . Skin ulcer of scalp ([place]) 05/10/2013  . 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 Symptomatic carotid artery stenosis 03/05/2017    Past Surgical History:  Procedure Laterality Date  . BACK SURGERY    . CARDIAC  CATHETERIZATION  03/06/10   NL-LM, 40%pLAD, 30% mid/distal LAD, 98% ostial DIAG (small), 30%pCx, 50%mCx, 30%dCx, 30% OM2, 40%dRCA; Med RX rec ([facility])  . caritid endart    . ENDARTERECTOMY Left 03/26/2017   Procedure: ENDARTERECTOMY CAROTID LEFT;  Surgeon: Rosetta Posner, MD;  Location: [facility] OR;  Service: Vascular;  Laterality: Left;  . femerao bypass    . LEFT HEART CATH AND CORONARY ANGIOGRAPHY N/A 02/03/2017   Procedure: LEFT HEART CATH AND CORONARY ANGIOGRAPHY;  Surgeon: Martinique, Peter M, MD;  Location: [place] [facility];  Service: Cardiovascular;  Laterality: N/A;  . PATCH ANGIOPLASTY Left 03/26/2017   Procedure: PATCH ANGIOPLASTY LEFT CAROTID ARTERY;  Surgeon: Rosetta Posner, MD;  Location: [place];  Service: Vascular;  Laterality: Left;  . SCALP LACERATION REPAIR N/A 05/23/2013   Procedure: EXCISION SCALP ULCER DEBRIDEMENT OF SKIN AND BONE WITH PLACEMENT OF A-CELL;  Surgeon: Irene Limbo, MD;  Location: [place];  Service: Plastics;  Laterality: N/A;  . STOMACH SURGERY     x2  . VASCULAR SURGERY     AFBG 10/15/05     reports that he quit smoking about 11 years ago. He has never used smokeless tobacco. He reports current alcohol use. He reports that he does not use drugs.  Allergies  Allergen Reactions  . Other Other (See Comments)    Any narcotic makes him a "wild man"  Delusions (intolerance)    Family History  Problem Relation Age of Onset  . Heart failure  Father     Prior to Admission medications   Medication Sig Start Date End Date Taking? Authorizing Provider  Acetaminophen 500 MG coapsule Take 1,000 mg by mouth every 4 (four) hours as needed for fever or pain.  08/20/17  Yes [provider]  aspirin EC 81 MG tablet Take 81 mg by mouth daily.   Yes [provider]  atorvastatin (LIPITOR) 80 MG tablet TAKE 1 TABLET EVERY DAY Patient taking differently: Take 80 mg by mouth daily.  09/07/19  Yes Marge Duncans, PA-C  cilostazol (PLETAL) 100 MG tablet Take 1 tablet (100  mg total) by mouth 2 (two) times daily. Patient taking differently: Take 50 mg by mouth 2 (two) times daily.  06/13/19  Yes Cox, Kirsten, MD  cloNIDine (CATAPRES) 0.1 MG tablet TAKE 1 TABLET TWICE DAILY 09/07/19  Yes Marge Duncans, PA-C  clopidogrel (PLAVIX) 75 MG tablet TAKE 1 TABLET EVERY DAY Patient taking differently: Take 75 mg by mouth daily.  05/02/19  Yes Cox, Kirsten, MD  fenofibrate 160 MG tablet Take 160 mg by mouth daily.   Yes [provider]  levothyroxine (SYNTHROID) 112 MCG tablet Take 1 tablet (112 mcg total) by mouth daily. 08/31/19  Yes Cox, Kirsten, MD  Multiple Vitamins-Minerals (CENTRUM SILVER PO) Take 1 tablet by mouth daily.   Yes [provider]  nitroGLYCERIN (NITROSTAT) 0.4 MG SL tablet Place 0.4 mg under the tongue every 5 (five) minutes as needed for chest pain.   Yes [provider]  PARoxetine (PAXIL) 20 MG tablet Take 20 mg by mouth daily.   Yes [provider]  potassium chloride (KLOR-CON) 10 MEQ tablet TAKE 3 TABLETS TWICE DAILY Patient taking differently: Take 10 mEq by mouth 2 (two) times daily.  06/04/19  Yes CoxElnita Maxwell, MD    Physical Exam: Constitutional: Moderately built and nourished. Vitals:   09/13/19 1159 09/13/19 1512 09/13/19 1608 09/13/19 1653  BP:  (!) 189/95 (!) 185/92 140/64  Pulse:  89 88 95  Resp:  18 12 14   Temp:      TempSrc:      SpO2:  99% 94% 94%  Weight: 67.8 kg     Height: 5\' 10"  (1.778 m)      Eyes: Anicteric no pallor. ENMT: No discharge from the ears eyes nose or mouth. Neck: No mass felt.  No neck rigidity. Respiratory: No rhonchi or crepitations.   Cardiovascular: S1-S2 heard. Abdomen: Soft nontender bowel sounds present. Musculoskeletal: No edema. Skin: No rash. Neurologic: Alert awake oriented to time place and person.  Moves all extremities. Psychiatric: Appears normal.   Labs on Admission: I have personally reviewed following labs and imaging studies  CBC: Recent Labs  Lab  09/13/19 1201  WBC 9.7  HGB 15.1  HCT 46.0  MCV 100.2*  PLT 245   Basic Metabolic Panel: Recent Labs  Lab 09/13/19 1201  NA 137  K 4.0  CL 104  CO2 22  GLUCOSE 107*  BUN 9  CREATININE 1.08  CALCIUM 9.2   GFR: Estimated Creatinine Clearance: 50.6 mL/min (by C-G formula based on SCr of 1.08 mg/dL). Liver Function Tests: No results for input(s): AST, ALT, ALKPHOS, BILITOT, PROT, ALBUMIN in the last 168 hours. No results for input(s): LIPASE, AMYLASE in the last 168 hours. No results for input(s): AMMONIA in the last 168 hours. Coagulation Profile: No results for input(s): INR, PROTIME in the last 168 hours. Cardiac Enzymes: No results for input(s): CKTOTAL, CKMB, CKMBINDEX, TROPONINI in the last 168  hours. BNP (last 3 results) No results for input(s): PROBNP in the last 8760 hours. HbA1C: No results for input(s): HGBA1C in the last 72 hours. CBG: No results for input(s): GLUCAP in the last 168 hours. Lipid Profile: No results for input(s): CHOL, HDL, LDLCALC, TRIG, CHOLHDL, LDLDIRECT in the last 72 hours. Thyroid Function Tests: No results for input(s): TSH, T4TOTAL, FREET4, T3FREE, THYROIDAB in the last 72 hours. Anemia Panel: No results for input(s): VITAMINB12, FOLATE, FERRITIN, TIBC, IRON, RETICCTPCT in the last 72 hours. Urine analysis:    Component Value Date/Time   COLORURINE YELLOW 03/22/2017 [place] 03/22/2017 1422   LABSPEC 1.017 03/22/2017 1422   PHURINE 5.0 03/22/2017 1422   GLUCOSEU NEGATIVE 03/22/2017 1422   HGBUR NEGATIVE 03/22/2017 1422   BILIRUBINUR NEGATIVE 03/22/2017 1422   KETONESUR NEGATIVE 03/22/2017 1422   PROTEINUR NEGATIVE 03/22/2017 1422   NITRITE NEGATIVE 03/22/2017 1422   LEUKOCYTESUR NEGATIVE 03/22/2017 1422   Sepsis Labs: @LABRCNTIP (procalcitonin:4,lacticidven:4) ) Recent Results (from the past 240 hour(s))  SARS Coronavirus 2 by RT PCR (hospital order, performed in [place] hospital lab) Nasopharyngeal  Nasopharyngeal Swab     Status: None   Collection Time: 09/13/19  8:38 PM   Specimen: Nasopharyngeal Swab  Result Value Ref Range Status   SARS Coronavirus 2 NEGATIVE NEGATIVE Final    Comment: (NOTE) SARS-CoV-2 target nucleic acids are NOT DETECTED.  The SARS-CoV-2 RNA is generally detectable in upper and lower respiratory specimens during the acute phase of infection. The lowest concentration of SARS-CoV-2 viral copies this assay can detect is 250 copies / mL. A negative result does not preclude SARS-CoV-2 infection and should not be used as the sole basis for treatment or other patient management decisions.  A negative result may occur with improper specimen collection / handling, submission of specimen other than nasopharyngeal swab, presence of viral mutation(s) within the areas targeted by this assay, and inadequate number of viral copies (<250 copies / mL). A negative result must be combined with clinical observations, patient history, and epidemiological information.  Fact Sheet for Patients:   StrictlyIdeas.no  Fact Sheet for Healthcare Providers: BankingDealers.co.za  This test is not yet approved or  cleared by the [place] FDA and has been authorized for detection and/or diagnosis of SARS-CoV-2 by FDA under an Emergency Use Authorization (EUA).  This EUA will remain in effect (meaning this test can be used) for the duration of the COVID-19 declaration under Section 564(b)(1) of the Act, 21 U.S.C. section 360bbb-3(b)(1), unless the authorization is terminated or revoked sooner.  Performed at [facility], [place] [address], [place], [place] [address]      Radiological Exams on Admission: DG Chest 2 View  Result Date: 09/13/2019 CLINICAL DATA:  Chest pain EXAM: CHEST - 2 VIEW COMPARISON:  2018 FINDINGS: Emphysema with superimposed prominent interstitial changes. This appears slightly increased compared to 2018.  No pleural effusion. No pneumothorax. Normal heart size. No acute osseous abnormality. IMPRESSION: Emphysema with slight increase in superimposed prominent interstitial changes. This may reflect progression of underlying interstitial lung disease or superimposed mild edema. Electronically Signed   By: Macy Mis M.D.   On: 09/13/2019 12:36   CT Angio Chest PE W/Cm &/Or Wo Cm  Result Date: 09/13/2019 CLINICAL DATA:  Chest burning and shortness of breath for 2 weeks, abnormal EKG EXAM: CT ANGIOGRAPHY CHEST WITH CONTRAST TECHNIQUE: Multidetector CT imaging of the chest was performed using the standard protocol during bolus administration of intravenous contrast. Multiplanar  CT image reconstructions and MIPs were obtained to evaluate the vascular anatomy. CONTRAST:  66mL OMNIPAQUE IOHEXOL 350 MG/ML SOLN COMPARISON:  08/04/2017, 09/13/2019 FINDINGS: Cardiovascular: This is a technically adequate evaluation of the pulmonary vasculature. No filling defects or pulmonary emboli. The heart is unremarkable without pericardial effusion. There is diffuse atherosclerosis throughout the aorta and coronary vessels. Mediastinum/Nodes: Borderline enlarged mediastinal and hilar lymph nodes are seen, largest measuring 11 mm in short axis in the precarinal region. These are likely reactive. Thyroid, trachea, and esophagus are grossly unremarkable. Lungs/Pleura: Extensive emphysema is again identified, upper lobe predominant. Since the prior exam, diffuse interlobular septal thickening has developed along with trace bilateral pleural effusions. No airspace disease or pneumothorax. The central airways are patent. Upper Abdomen: No acute abnormality. Musculoskeletal: No acute or destructive bony lesions. Reconstructed images demonstrate no additional findings. Review of the MIP images confirms the above findings. IMPRESSION: 1. No evidence of pulmonary embolus. 2. Interstitial edema and trace bilateral pleural effusions,  superimposed upon severe emphysema. 3. Aortic Atherosclerosis (ICD10-I70.0) and Emphysema (ICD10-J43.9). Electronically Signed   By: Randa Ngo M.D.   On: 09/13/2019 23:03    EKG: Independently reviewed.  Normal sinus rhythm.  Assessment/Plan Principal Problem:   Chest pain Active Problems:   Coronary artery disease involving native coronary artery of native heart without angina pectoris   Essential hypertension   Symptomatic carotid artery stenosis   Exertional dyspnea    1. Exertional chest pain and shortness of breath concerning for unstable angina -we will consult cardiology.  Patient is on aspirin statins which will be continued.  Since patient has mild diffuse edema with mild pleural effusion and slightly elevated BNP I have ordered 1 dose of IV Lasix. 2. Uncontrolled hypertension -patient blood pressure was elevated at admission.  Patient is on clonidine I have also ordered 1 dose of IV Lasix.  Follow blood pressure trends. 3. History of stroke and carotid endarterectomy on aspirin and statins. 4. History of peripheral vascular disease status post aortofemoral bypass on aspirin and statins. 5. COPD not actively wheezing.   DVT prophylaxis: Lovenox. Code Status: Full code. Family Communication: Discussed with patient. Disposition Plan: Home. Consults called: Cardiology. Admission status: Observation.   Rise Patience MD [facility] Pager [phone].  If 7PM-7AM, please contact night-coverage [email or web address] Password [facility]  09/13/2019, 11:44 PM

## 2019-09-14 ENCOUNTER — Encounter (HOSPITAL_COMMUNITY)
Admission: EM | Disposition: A | Discharge status: Home Health | Payer: Self-pay | Source: Ambulatory Visit | Attending: Internal Medicine

## 2019-09-14 ENCOUNTER — Observation Stay (HOSPITAL_COMMUNITY): Payer: Medicare HMO

## 2019-09-14 ENCOUNTER — Encounter (HOSPITAL_COMMUNITY): Payer: Self-pay | Admitting: Internal Medicine

## 2019-09-14 DIAGNOSIS — I5031 Acute diastolic (congestive) heart failure: Secondary | ICD-10-CM | POA: Diagnosis not present

## 2019-09-14 DIAGNOSIS — F419 Anxiety disorder, unspecified: Secondary | ICD-10-CM | POA: Diagnosis not present

## 2019-09-14 DIAGNOSIS — Z23 Encounter for immunization: Secondary | ICD-10-CM | POA: Diagnosis present

## 2019-09-14 DIAGNOSIS — Z79899 Other long term (current) drug therapy: Secondary | ICD-10-CM | POA: Diagnosis not present

## 2019-09-14 DIAGNOSIS — R072 Precordial pain: Secondary | ICD-10-CM | POA: Diagnosis present

## 2019-09-14 DIAGNOSIS — I200 Unstable angina: Secondary | ICD-10-CM

## 2019-09-14 DIAGNOSIS — E039 Hypothyroidism, unspecified: Secondary | ICD-10-CM | POA: Diagnosis not present

## 2019-09-14 DIAGNOSIS — Z8616 Personal history of COVID-19: Secondary | ICD-10-CM | POA: Diagnosis not present

## 2019-09-14 DIAGNOSIS — I739 Peripheral vascular disease, unspecified: Secondary | ICD-10-CM | POA: Diagnosis not present

## 2019-09-14 DIAGNOSIS — Z87891 Personal history of nicotine dependence: Secondary | ICD-10-CM | POA: Diagnosis not present

## 2019-09-14 DIAGNOSIS — Z79890 Hormone replacement therapy (postmenopausal): Secondary | ICD-10-CM | POA: Diagnosis not present

## 2019-09-14 DIAGNOSIS — I2584 Coronary atherosclerosis due to calcified coronary lesion: Secondary | ICD-10-CM | POA: Diagnosis not present

## 2019-09-14 DIAGNOSIS — F329 Major depressive disorder, single episode, unspecified: Secondary | ICD-10-CM | POA: Diagnosis not present

## 2019-09-14 DIAGNOSIS — Z85820 Personal history of malignant melanoma of skin: Secondary | ICD-10-CM | POA: Diagnosis not present

## 2019-09-14 DIAGNOSIS — Z885 Allergy status to narcotic agent status: Secondary | ICD-10-CM | POA: Diagnosis not present

## 2019-09-14 DIAGNOSIS — Z8249 Family history of ischemic heart disease and other diseases of the circulatory system: Secondary | ICD-10-CM | POA: Diagnosis not present

## 2019-09-14 DIAGNOSIS — J439 Emphysema, unspecified: Secondary | ICD-10-CM | POA: Diagnosis present

## 2019-09-14 DIAGNOSIS — Z95828 Presence of other vascular implants and grafts: Secondary | ICD-10-CM | POA: Diagnosis not present

## 2019-09-14 DIAGNOSIS — I110 Hypertensive heart disease with heart failure: Secondary | ICD-10-CM | POA: Diagnosis not present

## 2019-09-14 DIAGNOSIS — Z7902 Long term (current) use of antithrombotics/antiplatelets: Secondary | ICD-10-CM | POA: Diagnosis not present

## 2019-09-14 DIAGNOSIS — I5041 Acute combined systolic (congestive) and diastolic (congestive) heart failure: Secondary | ICD-10-CM | POA: Diagnosis not present

## 2019-09-14 DIAGNOSIS — Z7982 Long term (current) use of aspirin: Secondary | ICD-10-CM | POA: Diagnosis not present

## 2019-09-14 DIAGNOSIS — Z8673 Personal history of transient ischemic attack (TIA), and cerebral infarction without residual deficits: Secondary | ICD-10-CM | POA: Diagnosis not present

## 2019-09-14 DIAGNOSIS — I255 Ischemic cardiomyopathy: Secondary | ICD-10-CM | POA: Diagnosis present

## 2019-09-14 DIAGNOSIS — I25110 Atherosclerotic heart disease of native coronary artery with unstable angina pectoris: Secondary | ICD-10-CM | POA: Diagnosis not present

## 2019-09-14 DIAGNOSIS — E785 Hyperlipidemia, unspecified: Secondary | ICD-10-CM | POA: Diagnosis present

## 2019-09-14 HISTORY — PX: LEFT HEART CATH AND CORONARY ANGIOGRAPHY: CATH118249

## 2019-09-14 HISTORY — PX: CORONARY STENT INTERVENTION: CATH118234

## 2019-09-14 HISTORY — PX: CORONARY ATHERECTOMY: CATH118238

## 2019-09-14 LAB — ECHOCARDIOGRAM COMPLETE
Height: 70 in
Weight: 2390.56 oz

## 2019-09-14 LAB — POCT ACTIVATED CLOTTING TIME: Activated Clotting Time: 334 seconds

## 2019-09-14 LAB — CBC
HCT: 43.1 % (ref 39.0–52.0)
Hemoglobin: 14.4 g/dL (ref 13.0–17.0)
MCH: 33.6 pg (ref 26.0–34.0)
MCHC: 33.4 g/dL (ref 30.0–36.0)
MCV: 100.5 fL — ABNORMAL HIGH (ref 80.0–100.0)
Platelets: 294 10*3/uL (ref 150–400)
RBC: 4.29 MIL/uL (ref 4.22–5.81)
RDW: 15.2 % (ref 11.5–15.5)
WBC: 9.2 10*3/uL (ref 4.0–10.5)
nRBC: 0 % (ref 0.0–0.2)

## 2019-09-14 LAB — CREATININE, SERUM
Creatinine, Ser: 1.05 mg/dL (ref 0.61–1.24)
GFR calc Af Amer: >60 mL/min (ref >60)
GFR calc non Af Amer: >60 mL/min (ref >60)

## 2019-09-14 SURGERY — LEFT HEART CATH AND CORONARY ANGIOGRAPHY
Anesthesia: LOCAL

## 2019-09-14 MED ORDER — SODIUM CHLORIDE 0.9% FLUSH: 3.0000 mL | Freq: Two times a day (BID) | INTRAVENOUS | Status: DC

## 2019-09-14 MED ORDER — HEPARIN (PORCINE) IN NACL 1000-0.9 UT/500ML-% IV SOLN
INTRAVENOUS | Status: AC
  Filled 2019-09-14: qty 500

## 2019-09-14 MED ORDER — IOHEXOL 350 MG/ML SOLN
INTRAVENOUS | Status: DC | PRN
  Administered 2019-09-14: 225 mL

## 2019-09-14 MED ORDER — SODIUM CHLORIDE 0.9 % IV SOLN: 250.0000 mL | INTRAVENOUS | Status: DC | PRN

## 2019-09-14 MED ORDER — MIDAZOLAM HCL 2 MG/2ML IJ SOLN
INTRAMUSCULAR | Status: AC
  Filled 2019-09-14: qty 2

## 2019-09-14 MED ORDER — ASPIRIN 81 MG PO CHEW: 81.0000 mg | CHEWABLE_TABLET | ORAL | Status: DC

## 2019-09-14 MED ORDER — VERAPAMIL HCL 2.5 MG/ML IV SOLN
INTRAVENOUS | Status: DC | PRN
  Administered 2019-09-14: 10 mL via INTRA_ARTERIAL

## 2019-09-14 MED ORDER — METOPROLOL SUCCINATE ER 25 MG PO TB24
25.0000 mg | ORAL_TABLET | Freq: Every day | ORAL | Status: DC
  Administered 2019-09-14 – 2019-09-16 (×2): 25 mg via ORAL
  Filled 2019-09-14 (×2): qty 1

## 2019-09-14 MED ORDER — LIDOCAINE HCL (PF) 1 % IJ SOLN
INTRAMUSCULAR | Status: DC | PRN
  Administered 2019-09-14: 2 mL

## 2019-09-14 MED ORDER — SODIUM CHLORIDE 0.9 % IV SOLN: INTRAVENOUS | Status: DC

## 2019-09-14 MED ORDER — HEPARIN SODIUM (PORCINE) 5000 UNIT/ML IJ SOLN
5000.0000 [IU] | Freq: Three times a day (TID) | INTRAMUSCULAR | Status: DC
  Administered 2019-09-14 – 2019-09-15 (×2): 5000 [IU] via SUBCUTANEOUS
  Filled 2019-09-14 (×2): qty 1

## 2019-09-14 MED ORDER — PNEUMOCOCCAL VAC POLYVALENT 25 MCG/0.5ML IJ INJ
0.5000 mL | INJECTION | INTRAMUSCULAR | Status: AC
  Administered 2019-09-15: 0.5 mL via INTRAMUSCULAR
  Filled 2019-09-14: qty 0.5

## 2019-09-14 MED ORDER — HYDRALAZINE HCL 20 MG/ML IJ SOLN: 10.0000 mg | INTRAMUSCULAR | Status: AC | PRN

## 2019-09-14 MED ORDER — FENTANYL CITRATE (PF) 100 MCG/2ML IJ SOLN
INTRAMUSCULAR | Status: AC
  Filled 2019-09-14: qty 2

## 2019-09-14 MED ORDER — SODIUM CHLORIDE 0.9 % WEIGHT BASED INFUSION: 1.0000 mL/kg/h | INTRAVENOUS | Status: DC

## 2019-09-14 MED ORDER — MIDAZOLAM HCL 2 MG/2ML IJ SOLN
INTRAMUSCULAR | Status: DC | PRN
  Administered 2019-09-14: 1 mg via INTRAVENOUS

## 2019-09-14 MED ORDER — SODIUM CHLORIDE 0.9% FLUSH: 3.0000 mL | INTRAVENOUS | Status: DC | PRN

## 2019-09-14 MED ORDER — NITROGLYCERIN 1 MG/10 ML FOR IR/CATH LAB
INTRA_ARTERIAL | Status: DC | PRN
  Administered 2019-09-14 (×3): 200 ug via INTRACORONARY

## 2019-09-14 MED ORDER — VERAPAMIL HCL 2.5 MG/ML IV SOLN
INTRAVENOUS | Status: AC
  Filled 2019-09-14: qty 2

## 2019-09-14 MED ORDER — HEPARIN SODIUM (PORCINE) 1000 UNIT/ML IJ SOLN
INTRAMUSCULAR | Status: AC
  Filled 2019-09-14: qty 1

## 2019-09-14 MED ORDER — HEPARIN (PORCINE) IN NACL 1000-0.9 UT/500ML-% IV SOLN
INTRAVENOUS | Status: DC | PRN
  Administered 2019-09-14 (×2): 500 mL

## 2019-09-14 MED ORDER — HEPARIN SODIUM (PORCINE) 1000 UNIT/ML IJ SOLN
INTRAMUSCULAR | Status: DC | PRN
  Administered 2019-09-14 (×2): 3500 [IU] via INTRAVENOUS

## 2019-09-14 MED ORDER — VIPERSLIDE LUBRICANT OPTIME: TOPICAL | Status: DC | PRN

## 2019-09-14 MED ORDER — FENTANYL CITRATE (PF) 100 MCG/2ML IJ SOLN
INTRAMUSCULAR | Status: DC | PRN
  Administered 2019-09-14: 25 ug via INTRAVENOUS

## 2019-09-14 MED ORDER — NITROGLYCERIN 1 MG/10 ML FOR IR/CATH LAB
INTRA_ARTERIAL | Status: AC
  Filled 2019-09-14: qty 10

## 2019-09-14 MED ORDER — IOHEXOL 350 MG/ML SOLN
INTRAVENOUS | Status: AC
  Filled 2019-09-14: qty 1

## 2019-09-14 MED ORDER — SODIUM CHLORIDE 0.9 % IV SOLN
INTRAVENOUS | Status: DC
  Administered 2019-09-15: 250 mL via INTRAVENOUS

## 2019-09-14 SURGICAL SUPPLY — 20 items
BALLN SAPPHIRE 2.5X15 (BALLOONS) ×2
BALLN SAPPHIRE ~~LOC~~ 3.0X18 (BALLOONS) ×2 IMPLANT
BALLOON SAPPHIRE 2.5X15 (BALLOONS) ×1 IMPLANT
CATH 5FR JL3.5 JR4 ANG PIG MP (CATHETERS) ×2 IMPLANT
CATH VISTA GUIDE 6FR XBLAD3.5 (CATHETERS) ×2 IMPLANT
CROWN DIAMONDBACK CLASSIC 1.25 (BURR) ×2 IMPLANT
DEVICE RAD COMP TR BAND LRG (VASCULAR PRODUCTS) ×2 IMPLANT
ELECT DEFIB PAD ADLT CADENCE (PAD) ×2 IMPLANT
GLIDESHEATH SLEND SS 6F .021 (SHEATH) ×2 IMPLANT
GUIDEWIRE INQWIRE 1.5J.035X260 (WIRE) ×1 IMPLANT
INQWIRE 1.5J .035X260CM (WIRE) ×2
KIT ENCORE 26 ADVANTAGE (KITS) ×2 IMPLANT
KIT HEART LEFT (KITS) ×2 IMPLANT
LUBRICANT VIPERSLIDE CORONARY (MISCELLANEOUS) ×2 IMPLANT
PACK CARDIAC CATHETERIZATION (CUSTOM PROCEDURE TRAY) ×2 IMPLANT
STENT RESOLUTE ONYX 3.0X34 (Permanent Stent) ×2 IMPLANT
TRANSDUCER W/STOPCOCK (MISCELLANEOUS) ×2 IMPLANT
TUBING CIL FLEX 10 FLL-RA (TUBING) ×2 IMPLANT
WIRE ASAHI PROWATER 180CM (WIRE) ×2 IMPLANT
WIRE VIPERWIRE COR FLEX .012 (WIRE) ×2 IMPLANT

## 2019-09-14 NOTE — Progress Notes (Signed)
   09/14/19 1855  Assess: MEWS Score  Temp (!) 97.4 F (36.3 C)  Pulse Rate 78  ECG Heart Rate 79  Resp (!) 21  SpO2 92 %  O2 Device Nasal Cannula  O2 Flow Rate (L/min) 2 L/min  Assess: MEWS Score  MEWS Temp 0  MEWS Systolic 1  MEWS Pulse 0  MEWS RR 1  MEWS LOC 0  MEWS Score 2  MEWS Score Color Yellow  Assess: if the MEWS score is Yellow or Red  Were vital signs taken at a resting state? Yes  Focused Assessment Documented focused assessment  Early Detection of Sepsis Score *See Row Information* Low  MEWS guidelines implemented *See Row Information* No, vital signs rechecked

## 2019-09-14 NOTE — Progress Notes (Signed)
Patient off floor to cath lab.  

## 2019-09-14 NOTE — Progress Notes (Signed)
PROGRESS NOTE    Earl Mueller  PXT:[id number] DOB: 1935-11-17 DOA: 09/13/2019 PCP: Rochel Brome, MD   Brief Narrative: hpi per dr Hal Hope 09/13/2019  Earl Mueller is a 84 y.o. male with history of office CAD managed medically with history of peripheral vascular disease status post aortofemoral bypass, history of carotid endarterectomy, hypertension previous history of cigarette smoking has been experiencing exertional chest pain and shortness of breath for the last 1 month.  Since the frequency and pain has increased patient presents to the ER.  At rest patient is not having any pain.  Denies any fever chills or productive cough.  ED Course: In the ER labs show unremarkable metabolic panel and CBC high sensitive troponins were 12 and 13 with BNP of 235.6.  Covid test was negative.  EKG shows rhythm with nonspecific T changes.  Since patient had elevated D-dimer CT angiogram of the chest was done which showed description edema and mild pleural effusion.  Patient admitted for further management of exertional chest pain and shortness of breath concerning for angina.  Assessment & Plan:   Principal Problem:   Chest pain Active Problems:   Coronary artery disease involving native coronary artery of native heart without angina pectoris   Essential hypertension   Symptomatic carotid artery stenosis   Exertional dyspnea   Unstable angina (HCC)    #1  Exertional angina/systolic heart failure acute- Chest CT no pulmonary embolism, interstitial edema and trace bilateral pleural effusion superimposed upon severe emphysema.  Troponin negative BNP 235 Echocardiogram-ejection fraction 39%.  Left ventricle moderately decreased function.  Left ventricle demonstrates global hypokinesis severe left ventricular hypertrophy. Cardiology following cath today  #2 uncontrolled hypertension improved on Toprol and Catapres.  #3 history of stroke and carotid endarterectomy on aspirin and statins  #4  history of COPD stable  #5 history of peripheral vascular disease on aspirin and statin  #6 hypothyroidism continue Synthroid  #7 depression on Paxil     Estimated body mass index is 20.28 kg/m as calculated from the following:   Height as of this encounter: 5\' 10"  (1.778 m).   Weight as of this encounter: 64.1 kg.  DVT prophylaxis: Lovenox Code Status-full code Family Communication: Discussed with patient none at bedside  disposition Plan:  Status is: Inpatient   Dispo: The patient is from: Home              Anticipated d/c is to: Home              Anticipated d/c date is: > 3 days              Patient currently is not medically stable to d/c.  Admitted with unstable angina work-up ongoing    Consultants: Cardiology  Procedures: None Antimicrobials: None  Subjective: Patient resting in bed Denies any chest pain feels better  Objective: Vitals:   09/14/19 1502 09/14/19 1530 09/14/19 1531 09/14/19 1603  BP: 118/70  122/65 129/67  Pulse: 70 71 68 71  Resp: 18 17 16 18   Temp:   98 F (36.7 C)   TempSrc:   Oral   SpO2: 94% 96% 96% 95%  Weight:      Height:        Intake/Output Summary (Last 24 hours) at 09/14/2019 1627 Last data filed at 09/14/2019 0240 Gross per 24 hour  Intake --  Output 1625 ml  Net -1625 ml   Filed Weights   09/13/19 1159 09/14/19 1131  Weight: 67.8 kg 64.1 kg  Examination:  General exam: Appears calm and comfortable  Respiratory system: Crackles to auscultation. Respiratory effort normal. Cardiovascular system: S1 & S2 heard, RRR. No JVD, murmurs, rubs, gallops or clicks. No pedal edema. Gastrointestinal system: Abdomen is nondistended, soft and nontender. No organomegaly or masses felt. Normal bowel sounds heard. Central nervous system: Alert and oriented. No focal neurological deficits. Extremities: Trace bilateral pitting edema Skin: No rashes, lesions or ulcers Psychiatry: Judgement and insight appear normal. Mood & affect  appropriate.     Data Reviewed: I have personally reviewed following labs and imaging studies  CBC: Recent Labs  Lab 09/13/19 1201 09/14/19 0047  WBC 9.7 9.2  HGB 15.1 14.4  HCT 46.0 43.1  MCV 100.2* 100.5*  PLT 302 161   Basic Metabolic Panel: Recent Labs  Lab 09/13/19 1201 09/14/19 0047  NA 137  --   K 4.0  --   CL 104  --   CO2 22  --   GLUCOSE 107*  --   BUN 9  --   CREATININE 1.08 1.05  CALCIUM 9.2  --    GFR: Estimated Creatinine Clearance: 49.2 mL/min (by C-G formula based on SCr of 1.05 mg/dL). Liver Function Tests: No results for input(s): AST, ALT, ALKPHOS, BILITOT, PROT, ALBUMIN in the last 168 hours. No results for input(s): LIPASE, AMYLASE in the last 168 hours. No results for input(s): AMMONIA in the last 168 hours. Coagulation Profile: No results for input(s): INR, PROTIME in the last 168 hours. Cardiac Enzymes: No results for input(s): CKTOTAL, CKMB, CKMBINDEX, TROPONINI in the last 168 hours. BNP (last 3 results) No results for input(s): PROBNP in the last 8760 hours. HbA1C: No results for input(s): HGBA1C in the last 72 hours. CBG: No results for input(s): GLUCAP in the last 168 hours. Lipid Profile: No results for input(s): CHOL, HDL, LDLCALC, TRIG, CHOLHDL, LDLDIRECT in the last 72 hours. Thyroid Function Tests: No results for input(s): TSH, T4TOTAL, FREET4, T3FREE, THYROIDAB in the last 72 hours. Anemia Panel: No results for input(s): VITAMINB12, FOLATE, FERRITIN, TIBC, IRON, RETICCTPCT in the last 72 hours. Sepsis Labs: No results for input(s): PROCALCITON, LATICACIDVEN in the last 168 hours.  Recent Results (from the past 240 hour(s))  SARS Coronavirus 2 by RT PCR (hospital order, performed in [facility] hospital lab) Nasopharyngeal Nasopharyngeal Swab     Status: None   Collection Time: 09/13/19  8:38 PM   Specimen: Nasopharyngeal Swab  Result Value Ref Range Status   SARS Coronavirus 2 NEGATIVE NEGATIVE Final    Comment:  (NOTE) SARS-CoV-2 target nucleic acids are NOT DETECTED.  The SARS-CoV-2 RNA is generally detectable in upper and lower respiratory specimens during the acute phase of infection. The lowest concentration of SARS-CoV-2 viral copies this assay can detect is 250 copies / mL. A negative result does not preclude SARS-CoV-2 infection and should not be used as the sole basis for treatment or other patient management decisions.  A negative result may occur with improper specimen collection / handling, submission of specimen other than nasopharyngeal swab, presence of viral mutation(s) within the areas targeted by this assay, and inadequate number of viral copies (<250 copies / mL). A negative result must be combined with clinical observations, patient history, and epidemiological information.  Fact Sheet for Patients:   StrictlyIdeas.no  Fact Sheet for Healthcare Providers: BankingDealers.co.za  This test is not yet approved or  cleared by the [place] FDA and has been authorized for detection and/or diagnosis of SARS-CoV-2 by FDA under an  Emergency Use Authorization (EUA).  This EUA will remain in effect (meaning this test can be used) for the duration of the COVID-19 declaration under Section 564(b)(1) of the Act, 21 U.S.C. section 360bbb-3(b)(1), unless the authorization is terminated or revoked sooner.  Performed at [facility], [place] [address], [place], [place] [address]          Radiology Studies: DG Chest 2 View  Result Date: 09/13/2019 CLINICAL DATA:  Chest pain EXAM: CHEST - 2 VIEW COMPARISON:  2018 FINDINGS: Emphysema with superimposed prominent interstitial changes. This appears slightly increased compared to 2018. No pleural effusion. No pneumothorax. Normal heart size. No acute osseous abnormality. IMPRESSION: Emphysema with slight increase in superimposed prominent interstitial changes. This may reflect  progression of underlying interstitial lung disease or superimposed mild edema. Electronically Signed   By: Macy Mis M.D.   On: 09/13/2019 12:36   CT Angio Chest PE W/Cm &/Or Wo Cm  Result Date: 09/13/2019 CLINICAL DATA:  Chest burning and shortness of breath for 2 weeks, abnormal EKG EXAM: CT ANGIOGRAPHY CHEST WITH CONTRAST TECHNIQUE: Multidetector CT imaging of the chest was performed using the standard protocol during bolus administration of intravenous contrast. Multiplanar CT image reconstructions and MIPs were obtained to evaluate the vascular anatomy. CONTRAST:  48mL OMNIPAQUE IOHEXOL 350 MG/ML SOLN COMPARISON:  08/04/2017, 09/13/2019 FINDINGS: Cardiovascular: This is a technically adequate evaluation of the pulmonary vasculature. No filling defects or pulmonary emboli. The heart is unremarkable without pericardial effusion. There is diffuse atherosclerosis throughout the aorta and coronary vessels. Mediastinum/Nodes: Borderline enlarged mediastinal and hilar lymph nodes are seen, largest measuring 11 mm in short axis in the precarinal region. These are likely reactive. Thyroid, trachea, and esophagus are grossly unremarkable. Lungs/Pleura: Extensive emphysema is again identified, upper lobe predominant. Since the prior exam, diffuse interlobular septal thickening has developed along with trace bilateral pleural effusions. No airspace disease or pneumothorax. The central airways are patent. Upper Abdomen: No acute abnormality. Musculoskeletal: No acute or destructive bony lesions. Reconstructed images demonstrate no additional findings. Review of the MIP images confirms the above findings. IMPRESSION: 1. No evidence of pulmonary embolus. 2. Interstitial edema and trace bilateral pleural effusions, superimposed upon severe emphysema. 3. Aortic Atherosclerosis (ICD10-I70.0) and Emphysema (ICD10-J43.9). Electronically Signed   By: Randa Ngo M.D.   On: 09/13/2019 23:03   CARDIAC  CATHETERIZATION  Result Date: 09/14/2019  Ost LAD to Prox LAD lesion is 85% stenosed.  Mid LAD lesion is 65% stenosed.  1st Diag lesion is 100% stenosed.  Ost Cx to Prox Cx lesion is 80% stenosed.  1st Mrg lesion is 50% stenosed.  Prox RCA lesion is 25% stenosed.  Mid RCA to Dist RCA lesion is 25% stenosed.  Post intervention, there is a 0% residual stenosis.  A drug-eluting stent was successfully placed using a STENT RESOLUTE ONYX 3.0X34.  LV end diastolic pressure is normal.  1. Severe 2 vessel obstructive CAD with left dominant circulation. Severely calcified vessels. 2. Low LV EDP 5 mm Hg 3. Successful orbital atherectomy and stenting of the proximal LAD with DES x 1 Plan: Continue DAPT with ASA and Plavix indefinitely. Planned staged atherectomy and stenting of the proximal LCx tomorrow if stable.   ECHOCARDIOGRAM COMPLETE  Result Date: 09/14/2019    ECHOCARDIOGRAM REPORT   Patient Name:   VASHAUN OSMON Date of Exam: 09/14/2019 Medical Rec #:  [id number]      Height:       70.0 in Accession #:  [id number]     Weight:       149.4 lb Date of Birth:  [birth year]      BSA:          1.844 m Patient Age:    34 years       BP:           139/92 mmHg Patient Gender: M              HR:           84 bpm. Exam Location:  Inpatient Procedure: 2D Echo, Cardiac Doppler and Color Doppler Indications:    CHF-Acute Diastolic  History:        Patient has no prior history of Echocardiogram examinations. CAD                 and Angina; Risk Factors:Hypertension, Dyslipidemia and Former                 Smoker. DOE. H/o stroke.  Sonographer:    Clayton Lefort RDCS (AE) Referring Phys: [place]  Sonographer Comments: Suboptimal subcostal window. Image acquisition challenging due to respiratory motion. On stretcher in ED. IMPRESSIONS  1. Left ventricular ejection fraction by 3D volume is 39 %. The left ventricle has moderately decreased function. The left ventricle demonstrates global hypokinesis. There is  severe left ventricular hypertrophy. Indeterminate diastolic filling due to E-A fusion. Lateral LV wall appears severely hypokinetic.  2. Right ventricular systolic function is normal. The right ventricular size is normal.  3. The mitral valve is degenerative. Mild mitral valve regurgitation. No evidence of mitral stenosis.  4. The aortic valve is abnormal. Aortic valve regurgitation is mild. No aortic stenosis is present. FINDINGS  Left Ventricle: Left ventricular ejection fraction by 3D volume is 39 %. The left ventricle has moderately decreased function. The left ventricle demonstrates global hypokinesis. The left ventricular internal cavity size was normal in size. There is severe left ventricular hypertrophy. Indeterminate diastolic filling due to E-A fusion.  LV Wall Scoring: Lateral LV wall appears severely hypokinetic. Right Ventricle: The right ventricular size is normal. No increase in right ventricular wall thickness. Right ventricular systolic function is normal. Left Atrium: Left atrial size was normal in size. Right Atrium: Right atrial size was normal in size. Pericardium: There is no evidence of pericardial effusion. Presence of pericardial fat pad. Mitral Valve: Borderline mitral valve prolapse, incompletely assessed on this exam. The mitral valve is degenerative in appearance. There is mild calcification of the mitral valve leaflet(s). Mild mitral annular calcification. Mild mitral valve regurgitation. No evidence of mitral valve stenosis. Tricuspid Valve: The tricuspid valve is normal in structure. Tricuspid valve regurgitation is trivial. No evidence of tricuspid stenosis. Aortic Valve: The aortic valve is abnormal. Aortic valve regurgitation is mild. Aortic regurgitation PHT measures 415 msec. No aortic stenosis is present. There is moderate calcification of the aortic valve. Aortic valve mean gradient measures 5.0 mmHg. Aortic valve peak gradient measures 9.6 mmHg. Aortic valve area, by VTI  measures 2.24 cm. Pulmonic Valve: The pulmonic valve was not well visualized. Pulmonic valve regurgitation is trivial. No evidence of pulmonic stenosis. Aorta: The aortic root is normal in size and structure. Venous: The inferior vena cava was not well visualized. IAS/Shunts: The interatrial septum was not well visualized.  LEFT VENTRICLE PLAX 2D LVIDd:         4.50 cm         Diastology LVIDs:  3.30 cm         LV e' lateral:   3.60 cm/s LV PW:         1.50 cm         LV E/e' lateral: 15.0 LV IVS:        1.70 cm         LV e' medial:    3.26 cm/s LVOT diam:     2.10 cm         LV E/e' medial:  16.5 LV SV:         65 LV SV Index:   35 LVOT Area:     3.46 cm        3D Volume EF                                LV 3D EF:    Left                                             ventricular LV Volumes (MOD)                            ejection LV vol d, MOD    73.6 ml                    fraction by A2C:                                        3D volume LV vol d, MOD    75.8 ml                    is 39 %. A4C: LV vol s, MOD    43.8 ml A2C:                           3D Volume EF: LV vol s, MOD    45.4 ml       3D EF:        39 % A4C:                           LV EDV:       95 ml LV SV MOD A2C:   29.8 ml       LV ESV:       58 ml LV SV MOD A4C:   75.8 ml       LV SV:        37 ml LV SV MOD BP:    27.5 ml RIGHT VENTRICLE RV Basal diam:  2.60 cm RV S prime:     14.00 cm/s TAPSE (M-mode): 1.8 cm LEFT ATRIUM             Index       RIGHT ATRIUM           Index LA diam:        3.40 cm 1.84 cm/m  RA Area:     11.40 cm LA Vol (A2C):   47.0 ml 25.49 ml/m RA Volume:   20.20 ml  10.95 ml/m LA Vol (A4C):  50.5 ml 27.39 ml/m LA Biplane Vol: 48.8 ml 26.46 ml/m  AORTIC VALVE AV Area (Vmax):    1.74 cm AV Area (Vmean):   1.81 cm AV Area (VTI):     2.24 cm AV Vmax:           155.00 cm/s AV Vmean:          104.000 cm/s AV VTI:            0.292 m AV Peak Grad:      9.6 mmHg AV Mean Grad:      5.0 mmHg LVOT Vmax:         77.90 cm/s  LVOT Vmean:        54.400 cm/s LVOT VTI:          0.189 m LVOT/AV VTI ratio: 0.65 AI PHT:            415 msec  AORTA Ao Root diam: 3.10 cm MITRAL VALVE                TRICUSPID VALVE MV Area (PHT): 2.36 cm     TR Peak grad:   26.4 mmHg MV Decel Time: 322 msec     TR Vmax:        257.00 cm/s MV E velocity: 53.90 cm/s MV A velocity: 115.00 cm/s  SHUNTS MV E/A ratio:  0.47         Systemic VTI:  0.19 m                             Systemic Diam: 2.10 cm Cherlynn Kaiser MD Electronically signed by Cherlynn Kaiser MD Signature Date/Time: 09/14/2019/2:40:55 PM    Final         Scheduled Meds: . aspirin EC  81 mg Oral Daily  . atorvastatin  80 mg Oral Daily  . cilostazol  50 mg Oral BID  . cloNIDine  0.1 mg Oral BID  . clopidogrel  75 mg Oral Daily  . fenofibrate  160 mg Oral Daily  . heparin  5,000 Units Subcutaneous Q8H  . levothyroxine  112 mcg Oral Daily  . metoprolol succinate  25 mg Oral Daily  . PARoxetine  20 mg Oral Daily  . potassium chloride  10 mEq Oral BID  . sodium chloride flush  3 mL Intravenous Once  . [START ON 09/15/2019] sodium chloride flush  3 mL Intravenous Q12H   Continuous Infusions: . [START ON 09/15/2019] sodium chloride    . sodium chloride       LOS: 0 days     Georgette Shell, MD 09/14/2019, 4:27 PM

## 2019-09-14 NOTE — Consult Note (Addendum)
Cardiology Consultation:   Patient ID: [name]; [id number]; 25-Nov-1935   Admit date: 09/13/2019 Date of Consult: 09/14/2019  Primary Care Provider: Rochel Brome, MD Primary Cardiologist: Jenean Lindau, MD 07/05/2019 Primary Electrophysiologist:  None   Patient Profile:   Earl Mueller is a 84 y.o. male with a hx of CAD w/ med rx 2011 cath, HTN, CVA, carotid dz, skin CA, PAD w/ AoBifem, who is being seen today for the evaluation of chest pain at the request of Dr Zigmund Daniel.  History of Present Illness:   Mr. [name] noticed a change in his condition starting about a month ago.  He started noticing increasing shortness of breath.  He would get short of breath at first walking to the mailbox.  His symptoms progressed to the point that he cannot get out of the chair and walk to the kitchen without having to stop and catch his breath.  He denies orthopnea or PND, but remembers that his wife has told him she has heard him breathing hard during the night.  He has not had lower extremity edema.  He thinks he has gained about 10 pounds.  He also started having chest pain about the same time.  He describes the chest pain as a burning.  Approximately 6/10.  It occurs only with exertion.  He gets it every time he walks to the mailbox which is about 100 yards away.  It is relieved by rest in less than 5 minutes.  After he gets the mail, he walks back to the house and will get chest pain again by the time he gets there.  The episodes are always relieved in a short period of time by rest.  He does not remember ever having pain like this before.  He has problems with claudication symptoms, is aware that he does not have very good circulation in his feet.  He quit smoking 12 years ago.  He has not had any palpitations, no presyncope or syncope.  He has not had any acute illnesses, fevers or chills.  He initially went to [facility], and was transferred here.   Past Medical History:    Diagnosis Date  . Anxiety   . Arthritis   . Cancer ([place])    skin  . Carotid artery stenosis 03/26/2017  . Coronary artery disease involving native coronary artery of native heart without angina pectoris 11/20/2014  . Dyslipidemia 11/20/2014  . Essential hypertension 11/20/2014   Dr Geraldo Pitter, [facility]  . Hypothyroidism   . Peripheral vascular disease ([place])   . Pre-operative clearance 01/18/2017  . Skin ulcer of scalp ([place]) 05/10/2013  . 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 Symptomatic carotid artery stenosis 03/05/2017    Past Surgical History:  Procedure Laterality Date  . BACK SURGERY    . CARDIAC CATHETERIZATION  03/06/10   NL-LM, 40%pLAD, 30% mid/distal LAD, 98% ostial DIAG (small), 30%pCx, 50%mCx, 30%dCx, 30% OM2, 40%dRCA; Med RX rec ([facility])  . caritid endart    . ENDARTERECTOMY Left 03/26/2017   Procedure: ENDARTERECTOMY CAROTID LEFT;  Surgeon: Rosetta Posner, MD;  Location: [facility] OR;  Service: Vascular;  Laterality: Left;  . femerao bypass    . LEFT HEART CATH AND CORONARY ANGIOGRAPHY N/A 02/03/2017   Procedure: LEFT HEART CATH AND CORONARY ANGIOGRAPHY;  Surgeon: Martinique, Peter M, MD;  Location: [place] CV LAB;  Service: Cardiovascular;  Laterality: N/A;  . PATCH ANGIOPLASTY Left 03/26/2017   Procedure: PATCH ANGIOPLASTY  LEFT CAROTID ARTERY;  Surgeon: Rosetta Posner, MD;  Location: [facility];  Service: Vascular;  Laterality: Left;  . SCALP LACERATION REPAIR N/A 05/23/2013   Procedure: EXCISION SCALP ULCER DEBRIDEMENT OF SKIN AND BONE WITH PLACEMENT OF A-CELL;  Surgeon: Irene Limbo, MD;  Location: [facility];  Service: Plastics;  Laterality: N/A;  . STOMACH SURGERY     x2  . VASCULAR SURGERY     [facility] 10/15/05     Prior to Admission medications   Medication Sig Start Date End Date Taking? Authorizing Provider  Acetaminophen 500 MG coapsule Take 1,000 mg by mouth every 4 (four) hours as needed for fever or pain.  08/20/17  Yes [provider]  aspirin EC 81  MG tablet Take 81 mg by mouth daily.   Yes [provider]  atorvastatin (LIPITOR) 80 MG tablet TAKE 1 TABLET EVERY DAY Patient taking differently: Take 80 mg by mouth daily.  09/07/19  Yes Marge Duncans, PA-C  cilostazol (PLETAL) 100 MG tablet Take 1 tablet (100 mg total) by mouth 2 (two) times daily. Patient taking differently: Take 50 mg by mouth 2 (two) times daily.  06/13/19  Yes Cox, Kirsten, MD  cloNIDine (CATAPRES) 0.1 MG tablet TAKE 1 TABLET TWICE DAILY 09/07/19  Yes Marge Duncans, PA-C  clopidogrel (PLAVIX) 75 MG tablet TAKE 1 TABLET EVERY DAY Patient taking differently: Take 75 mg by mouth daily.  05/02/19  Yes Cox, Kirsten, MD  fenofibrate 160 MG tablet Take 160 mg by mouth daily.   Yes [provider]  levothyroxine (SYNTHROID) 112 MCG tablet Take 1 tablet (112 mcg total) by mouth daily. 08/31/19  Yes Cox, Kirsten, MD  Multiple Vitamins-Minerals (CENTRUM SILVER PO) Take 1 tablet by mouth daily.   Yes [provider]  nitroGLYCERIN (NITROSTAT) 0.4 MG SL tablet Place 0.4 mg under the tongue every 5 (five) minutes as needed for chest pain.   Yes [provider]  PARoxetine (PAXIL) 20 MG tablet Take 20 mg by mouth daily.   Yes [provider]  potassium chloride (KLOR-CON) 10 MEQ tablet TAKE 3 TABLETS TWICE DAILY Patient taking differently: Take 10 mEq by mouth 2 (two) times daily.  06/04/19  Yes Cox, Kirsten, MD    Inpatient Medications: Scheduled Meds: . aspirin EC  81 mg Oral Daily  . atorvastatin  80 mg Oral Daily  . cilostazol  50 mg Oral BID  . cloNIDine  0.1 mg Oral BID  . clopidogrel  75 mg Oral Daily  . enoxaparin (LOVENOX) injection  40 mg Subcutaneous Daily  . fenofibrate  160 mg Oral Daily  . levothyroxine  112 mcg Oral Daily  . PARoxetine  20 mg Oral Daily  . potassium chloride  10 mEq Oral BID  . sodium chloride flush  3 mL Intravenous Once   Continuous Infusions:  PRN Meds: acetaminophen, nitroGLYCERIN, ondansetron (ZOFRAN)  IV  Allergies:    Allergies  Allergen Reactions  . Other Other (See Comments)    Any narcotic makes him a "wild man"  Delusions (intolerance)    Social History:   Social History   Socioeconomic History  . Marital status: Married    Spouse name: Not on file  . Number of children: Not on file  . Years of education: Not on file  . Highest education level: Not on file  Occupational History  . Not on file  Tobacco Use  . Smoking status: Former Smoker    Quit date: 12/18/2007    Years since  quitting: 11.7  . Smokeless tobacco: Never Used  Vaping Use  . Vaping Use: Never used  Substance and Sexual Activity  . Alcohol use: Yes    Comment: occ  . Drug use: No  . Sexual activity: Not on file  Other Topics Concern  . Not on file  Social History Narrative  . Not on file   Social Determinants of Health   Financial Resource Strain:   . Difficulty of Paying Living Expenses:   Food Insecurity: No Food Insecurity  . Worried About Charity fundraiser in the Last Year: Never true  . Ran Out of Food in the Last Year: Never true  Transportation Needs: No Transportation Needs  . Lack of Transportation (Medical): No  . Lack of Transportation (Non-Medical): No  Physical Activity: Inactive  . Days of Exercise per Week: 0 days  . Minutes of Exercise per Session: 0 min  Stress:   . Feeling of Stress :   Social Connections:   . Frequency of Communication with Friends and Family:   . Frequency of Social Gatherings with Friends and Family:   . Attends Religious Services:   . Active Member of Clubs or Organizations:   . Attends Archivist Meetings:   Marland Kitchen Marital Status:   Intimate Partner Violence:   . Fear of Current or Ex-Partner:   . Emotionally Abused:   Marland Kitchen Physically Abused:   . Sexually Abused:     Family History:   Family History  Problem Relation Age of Onset  . Heart failure Father    Family Status:  Family Status  Relation Name Status  . Father  Deceased  .  Mother  Deceased    ROS:  Please see the history of present illness.  All other ROS reviewed and negative.     Physical Exam/Data:   Vitals:   09/14/19 0400 09/14/19 0430 09/14/19 0500 09/14/19 0530  BP: (!) 143/71 139/71 135/75 139/78  Pulse: 83 82 83 84  Resp: 13 18 17 15   Temp:      TempSrc:      SpO2: 91% 94% 93% 96%  Weight:      Height:        Intake/Output Summary (Last 24 hours) at 09/14/2019 1034 Last data filed at 09/14/2019 0240 Gross per 24 hour  Intake --  Output 1625 ml  Net -1625 ml    Last 3 Weights 09/13/2019 09/13/2019 07/05/2019  Weight (lbs) 149 lb 6.6 oz 149 lb 4.8 oz 147 lb 12.8 oz  Weight (kg) 67.772 kg 67.722 kg 67.042 kg     Body mass index is 21.44 kg/m.   General:  Well nourished, well developed, male in no acute distress HEENT: normal Lymph: no adenopathy Neck: JVD - 10 cm Endocrine:  No thryomegaly Vascular: No carotid bruits; upper extremity pulses 2+, lower extremity pulses decreased but palpable, capillary refill normal on the right, slightly delayed on the left Cardiac:  normal S1, S2; RRR; 2/6 high-pitched murmur Lungs: Rales bases bilaterally, no wheezing, rhonchi  Abd: soft, nontender, no hepatomegaly  Ext: no edema Musculoskeletal:  No deformities, BUE and BLE strength normal and equal Skin: warm and dry  Neuro:  CNs 2-12 intact, no focal abnormalities noted Psych:  Normal affect   EKG:  The EKG was personally reviewed and demonstrates: Sinus rhythm, heart rate 85, minimal ST depression in lead II only Telemetry:  Telemetry was personally reviewed and demonstrates: Sinus rhythm   CV studies:   ECHO: Pending  CATH: 02/03/2017 (after MV done w/ anterolateral ischemia)  Prox RCA lesion is 25% stenosed.  Mid RCA to Dist RCA lesion is 25% stenosed.  Ost LAD to Prox LAD lesion is 80% stenosed.  Mid LAD lesion is 60% stenosed.  1st Diag lesion is 100% stenosed.  Ost Cx to Prox Cx lesion is 75% stenosed.  1st Mrg lesion is  50% stenosed.  The left ventricular systolic function is normal.  LV end diastolic pressure is normal.  The left ventricular ejection fraction is 55-65% by visual estimate.   1. Left dominant circulation 2. 2 vessel obstructive coronary artery disease    - 80% segmental proximal LAD- severely calcified    - 100% mid first diagonal with left to left collaterals.    - 75% focal proximal LCx - moderately calcified    - 50% mid OM1 3. Good overall LV function with focal mid anterior hypokinesis c/w diagonal occlusion. 4. Normal LVEDP  Plan: Patient has moderate obstructive disease in proximal LAD and LCx. The diagonal is small and appears chronically occluded. LV function looks good. Although CT FFR indicates impaired flow in LAD and LCx the patient is asymptomatic so I would recommend medical management. If he were to develop symptoms of angina PCI could be considered. With calcification this would require modification of plaque with atherectomy. Based on these findings I feel he is a suitable risk to proceed with carotid endarterectomy. Diagnostic Dominance: Left    CATH: 2011 at [facility] NL-LM, 40%pLAD, 30% mid/distal LAD, 98% ostial DIAG (small), 30%pCx, 50%mCx, 30%dCx, 30% OM2, 40%dRCA; Med RX rec ([facility])  Laboratory Data:   Chemistry Recent Labs  Lab 09/13/19 1201 09/14/19 0047  NA 137  --   K 4.0  --   CL 104  --   CO2 22  --   GLUCOSE 107*  --   BUN 9  --   CREATININE 1.08 1.05  CALCIUM 9.2  --   GFRNONAA >60 >60  GFRAA >60 >60  ANIONGAP 11  --     Lab Results  Component Value Date   ALT 25 07/05/2019   AST 35 07/05/2019   ALKPHOS 87 07/05/2019   BILITOT 0.6 07/05/2019   Hematology Recent Labs  Lab 09/13/19 1201 09/14/19 0047  WBC 9.7 9.2  RBC 4.59 4.29  HGB 15.1 14.4  HCT 46.0 43.1  MCV 100.2* 100.5*  MCH 32.9 33.6  MCHC 32.8 33.4  RDW 15.1 15.2  PLT 302 294   Cardiac Enzymes High Sensitivity Troponin:   Recent Labs  Lab 09/13/19 1201  09/13/19 1825  TROPONINIHS 12 13      BNP Recent Labs  Lab 09/13/19 1825  BNP 235.6*    DDimer  Recent Labs  Lab 09/13/19 2102  DDIMER 7.55*   TSH:  Lab Results  Component Value Date   TSH 5.240 (H) 08/22/2019   Lipids: Lab Results  Component Value Date   CHOL 148 07/05/2019   HDL 56 07/05/2019   LDLCALC 79 07/05/2019   TRIG 67 07/05/2019   CHOLHDL 2.6 07/05/2019   HgbA1c:No results found for: HGBA1C Magnesium:  Magnesium  Date Value Ref Range Status  03/26/2017 1.7 1.7 - 2.4 mg/dL Final     Radiology/Studies:  DG Chest 2 View  Result Date: 09/13/2019 CLINICAL DATA:  Chest pain EXAM: CHEST - 2 VIEW COMPARISON:  2018 FINDINGS: Emphysema with superimposed prominent interstitial changes. This appears slightly increased compared to 2018. No pleural effusion. No pneumothorax. Normal heart size. No acute  osseous abnormality. IMPRESSION: Emphysema with slight increase in superimposed prominent interstitial changes. This may reflect progression of underlying interstitial lung disease or superimposed mild edema. Electronically Signed   By: Macy Mis M.D.   On: 09/13/2019 12:36   CT Angio Chest PE W/Cm &/Or Wo Cm  Result Date: 09/13/2019 CLINICAL DATA:  Chest burning and shortness of breath for 2 weeks, abnormal EKG EXAM: CT ANGIOGRAPHY CHEST WITH CONTRAST TECHNIQUE: Multidetector CT imaging of the chest was performed using the standard protocol during bolus administration of intravenous contrast. Multiplanar CT image reconstructions and MIPs were obtained to evaluate the vascular anatomy. CONTRAST:  72mL OMNIPAQUE IOHEXOL 350 MG/ML SOLN COMPARISON:  08/04/2017, 09/13/2019 FINDINGS: Cardiovascular: This is a technically adequate evaluation of the pulmonary vasculature. No filling defects or pulmonary emboli. The heart is unremarkable without pericardial effusion. There is diffuse atherosclerosis throughout the aorta and coronary vessels. Mediastinum/Nodes: Borderline enlarged  mediastinal and hilar lymph nodes are seen, largest measuring 11 mm in short axis in the precarinal region. These are likely reactive. Thyroid, trachea, and esophagus are grossly unremarkable. Lungs/Pleura: Extensive emphysema is again identified, upper lobe predominant. Since the prior exam, diffuse interlobular septal thickening has developed along with trace bilateral pleural effusions. No airspace disease or pneumothorax. The central airways are patent. Upper Abdomen: No acute abnormality. Musculoskeletal: No acute or destructive bony lesions. Reconstructed images demonstrate no additional findings. Review of the MIP images confirms the above findings. IMPRESSION: 1. No evidence of pulmonary embolus. 2. Interstitial edema and trace bilateral pleural effusions, superimposed upon severe emphysema. 3. Aortic Atherosclerosis (ICD10-I70.0) and Emphysema (ICD10-J43.9). Electronically Signed   By: Randa Ngo M.D.   On: 09/13/2019 23:03    Assessment and Plan:   1.  Acute CHF:  -Type unclear, echo pending -Chest x-ray was not that bad, but BNP is up and he had interstitial edema as well as trace pleural effusions on chest CT -He put out approximately 500 cc after dose of Lasix 20 mg IV -Discuss with MD continuing Lasix at 40 mg IV twice daily -Monitor renal function, daily weights and intake/output  2.  Chest pain: -He has known coronary artery disease. -Per Dr. Doug Sou note 2018, if he has additional symptoms, consider atherectomy and PCI -ECG is not acute and cardiac enzymes are negative -However, because he has been having consistent exertional angina, consider cath at some point during this hospitalization  3.  Hyperlipidemia: -Prior to admission, he was on Lipitor 80 mg daily -In April, LDL was still above goal at 2. -Discuss with MD if med changes are needed  4.  PAD: -He has some claudication symptoms, but distal pulses are intact and capillary refill is within normal limits.    -Continue to follow  Otherwise, per IM Principal Problem:   Chest pain Active Problems:   Coronary artery disease involving native coronary artery of native heart without angina pectoris   Essential hypertension   Symptomatic carotid artery stenosis   Exertional dyspnea  For questions or updates, please contact [facility] Please consult [email or web address] for contact info under Cardiology/STEMI.   Signed, Rosaria Ferries, PA-C  09/14/2019 10:34 AM As above, patient seen and examined.  Briefly he is an 84 year old male with past medical history of coronary artery disease, hypertension, prior CVA, peripheral vascular disease, melanoma for evaluation of acute combined systolic/diastolic congestive heart failure and unstable angina.  Last cardiac catheterization in 2018 showed an 80% ostial to proximal LAD, 60% mid LAD, occluded first diagonal, 75% ostial proximal circumflex and  normal LV function.  Patient treated medically.  Patient states that for the past month he has noticed progressive dyspnea on exertion.  He denies orthopnea, PND or pedal edema but does state he has gained approximately 10 pounds.  He also describes a burning substernal chest pain that occurs with exertion and is relieved with rest.  He has had no risk symptoms.  Because of his symptoms he presented for further evaluation and cardiology now asked to evaluate.  Chest CT showed no pulmonary embolus.  There was evidence of congestive heart failure and severe emphysema.  Troponin is 12 and 13.  BNP 235.  Electrocardiogram shows sinus rhythm with nonspecific ST changes.  1 acute combined systolic/diastolic congestive heart failure-patient presents with increasing dyspnea on exertion and there is evidence of CHF on a CT scan.  We will diurese with Lasix 40 mg IV daily.  Follow renal function.  Schedule echocardiogram to assess LV function.  2 unstable angina-patient describes a burning substernal chest pain with exertion relieved  with rest that could also be contributing to his dyspnea.  We will continue with aspirin, Plavix and statin.  Add low-dose Toprol at 25 mg daily.  Proceed with cardiac catheterization.  The risks and benefits including myocardial infarction, CVA and death discussed and he agrees to proceed.  3 hyperlipidemia-continue statin.  4 history of coronary artery disease-patient was on aspirin, Plavix we will continue with statin.  5 hypertension-continue present blood pressure medications and adjust regimen as needed.  Kirk Ruths, MD

## 2019-09-14 NOTE — Progress Notes (Signed)
Patient admitted to room, VS stable and patient free from pain. Patient oriented to room and daughter at bedside.

## 2019-09-14 NOTE — ED Notes (Signed)
Cards at bedside

## 2019-09-14 NOTE — ED Notes (Signed)
Tele  Breakfast Ordered 

## 2019-09-14 NOTE — Progress Notes (Signed)
°  Echocardiogram 2D Echocardiogram has been performed.  Earl Mueller 09/14/2019, 10:05 AM

## 2019-09-15 ENCOUNTER — Encounter (HOSPITAL_COMMUNITY): Payer: Self-pay | Admitting: Cardiology

## 2019-09-15 ENCOUNTER — Encounter (HOSPITAL_COMMUNITY)
Admission: EM | Disposition: A | Discharge status: Home Health | Payer: Self-pay | Source: Ambulatory Visit | Attending: Internal Medicine

## 2019-09-15 HISTORY — PX: CORONARY ATHERECTOMY: CATH118238

## 2019-09-15 HISTORY — PX: CORONARY STENT INTERVENTION: CATH118234

## 2019-09-15 LAB — POCT ACTIVATED CLOTTING TIME: Activated Clotting Time: 296 seconds

## 2019-09-15 LAB — BASIC METABOLIC PANEL
Anion gap: 9 (ref 5–15)
BUN: 17 mg/dL (ref 8–23)
CO2: 21 mmol/L — ABNORMAL LOW (ref 22–32)
Calcium: 8.4 mg/dL — ABNORMAL LOW (ref 8.9–10.3)
Chloride: 106 mmol/L (ref 98–111)
Creatinine, Ser: 1.35 mg/dL — ABNORMAL HIGH (ref 0.61–1.24)
GFR calc Af Amer: 56 mL/min — ABNORMAL LOW (ref >60)
GFR calc non Af Amer: 49 mL/min — ABNORMAL LOW (ref >60)
Glucose, Bld: 94 mg/dL (ref 70–99)
Potassium: 3.6 mmol/L (ref 3.5–5.1)
Sodium: 136 mmol/L (ref 135–145)

## 2019-09-15 LAB — CBC
HCT: 38.5 % — ABNORMAL LOW (ref 39.0–52.0)
HCT: 42.4 % (ref 39.0–52.0)
Hemoglobin: 12.5 g/dL — ABNORMAL LOW (ref 13.0–17.0)
Hemoglobin: 13.7 g/dL (ref 13.0–17.0)
MCH: 32.9 pg (ref 26.0–34.0)
MCH: 33.3 pg (ref 26.0–34.0)
MCHC: 32.5 g/dL (ref 30.0–36.0)
MCHC: 32.3 g/dL (ref 30.0–36.0)
MCV: 101.3 fL — ABNORMAL HIGH (ref 80.0–100.0)
MCV: 102.9 fL — ABNORMAL HIGH (ref 80.0–100.0)
Platelets: 278 10*3/uL (ref 150–400)
Platelets: 301 10*3/uL (ref 150–400)
RBC: 3.8 MIL/uL — ABNORMAL LOW (ref 4.22–5.81)
RBC: 4.12 MIL/uL — ABNORMAL LOW (ref 4.22–5.81)
RDW: 15.2 % (ref 11.5–15.5)
RDW: 15.5 % (ref 11.5–15.5)
WBC: 8.3 10*3/uL (ref 4.0–10.5)
WBC: 8.7 10*3/uL (ref 4.0–10.5)
nRBC: 0 % (ref 0.0–0.2)
nRBC: 0 % (ref 0.0–0.2)

## 2019-09-15 LAB — CREATININE, SERUM
Creatinine, Ser: 1.28 mg/dL — ABNORMAL HIGH (ref 0.61–1.24)
GFR calc Af Amer: >60 mL/min (ref >60)
GFR calc non Af Amer: 52 mL/min — ABNORMAL LOW (ref >60)

## 2019-09-15 SURGERY — CORONARY ATHERECTOMY
Anesthesia: LOCAL

## 2019-09-15 MED ORDER — VIPERSLIDE LUBRICANT OPTIME: TOPICAL | Status: DC | PRN

## 2019-09-15 MED ORDER — NITROGLYCERIN 1 MG/10 ML FOR IR/CATH LAB
INTRA_ARTERIAL | Status: DC | PRN
  Administered 2019-09-15: 200 ug via INTRACORONARY

## 2019-09-15 MED ORDER — SODIUM CHLORIDE 0.9% FLUSH
3.0000 mL | Freq: Two times a day (BID) | INTRAVENOUS | Status: DC
  Administered 2019-09-15: 3 mL via INTRAVENOUS

## 2019-09-15 MED ORDER — LIDOCAINE HCL (PF) 1 % IJ SOLN
INTRAMUSCULAR | Status: AC
  Filled 2019-09-15: qty 30

## 2019-09-15 MED ORDER — MIDAZOLAM HCL 2 MG/2ML IJ SOLN
INTRAMUSCULAR | Status: DC | PRN
  Administered 2019-09-15: 2 mg via INTRAVENOUS

## 2019-09-15 MED ORDER — IOHEXOL 350 MG/ML SOLN
INTRAVENOUS | Status: AC
  Filled 2019-09-15: qty 1

## 2019-09-15 MED ORDER — SODIUM CHLORIDE 0.9 % IV SOLN
325.0000 mg | INTRAVENOUS | Status: AC
  Administered 2019-09-15: 325 mg via INTRAVENOUS
  Filled 2019-09-15 (×2): qty 13

## 2019-09-15 MED ORDER — MIDAZOLAM HCL 2 MG/2ML IJ SOLN
INTRAMUSCULAR | Status: AC
  Filled 2019-09-15: qty 2

## 2019-09-15 MED ORDER — VERAPAMIL HCL 2.5 MG/ML IV SOLN
INTRAVENOUS | Status: AC
  Filled 2019-09-15: qty 2

## 2019-09-15 MED ORDER — HEPARIN (PORCINE) IN NACL 1000-0.9 UT/500ML-% IV SOLN
INTRAVENOUS | Status: DC | PRN
  Administered 2019-09-15: 500 mL

## 2019-09-15 MED ORDER — VERAPAMIL HCL 2.5 MG/ML IV SOLN
INTRAVENOUS | Status: DC | PRN
  Administered 2019-09-15: 10 mL via INTRA_ARTERIAL

## 2019-09-15 MED ORDER — NITROGLYCERIN 1 MG/10 ML FOR IR/CATH LAB
INTRA_ARTERIAL | Status: AC
  Filled 2019-09-15: qty 10

## 2019-09-15 MED ORDER — IOHEXOL 350 MG/ML SOLN
INTRAVENOUS | Status: DC | PRN
  Administered 2019-09-15: 105 mL

## 2019-09-15 MED ORDER — HEPARIN SODIUM (PORCINE) 1000 UNIT/ML IJ SOLN
INTRAMUSCULAR | Status: DC | PRN
  Administered 2019-09-15: 7000 [IU] via INTRAVENOUS

## 2019-09-15 MED ORDER — HEPARIN SODIUM (PORCINE) 1000 UNIT/ML IJ SOLN
INTRAMUSCULAR | Status: AC
  Filled 2019-09-15: qty 1

## 2019-09-15 MED ORDER — HEPARIN (PORCINE) IN NACL 1000-0.9 UT/500ML-% IV SOLN
INTRAVENOUS | Status: AC
  Filled 2019-09-15: qty 1500

## 2019-09-15 MED ORDER — SODIUM CHLORIDE 0.9% FLUSH: 3.0000 mL | INTRAVENOUS | Status: DC | PRN

## 2019-09-15 MED ORDER — HYDRALAZINE HCL 20 MG/ML IJ SOLN: 10.0000 mg | INTRAMUSCULAR | Status: AC | PRN

## 2019-09-15 MED ORDER — SODIUM CHLORIDE 0.9 % WEIGHT BASED INFUSION: 1.0000 mL/kg/h | INTRAVENOUS | Status: AC

## 2019-09-15 MED ORDER — SODIUM CHLORIDE 0.9 % IV SOLN: 250.0000 mL | INTRAVENOUS | Status: DC | PRN

## 2019-09-15 MED ORDER — LIDOCAINE HCL (PF) 1 % IJ SOLN
INTRAMUSCULAR | Status: DC | PRN
  Administered 2019-09-15: 2 mL

## 2019-09-15 MED ORDER — ASPIRIN 81 MG PO CHEW
81.0000 mg | CHEWABLE_TABLET | Freq: Every day | ORAL | Status: DC
  Administered 2019-09-15 – 2019-09-16 (×2): 81 mg via ORAL
  Filled 2019-09-15 (×2): qty 1

## 2019-09-15 MED ORDER — ENOXAPARIN SODIUM 40 MG/0.4ML ~~LOC~~ SOLN
40.0000 mg | SUBCUTANEOUS | Status: DC
  Administered 2019-09-16: 40 mg via SUBCUTANEOUS
  Filled 2019-09-15: qty 0.4

## 2019-09-15 SURGICAL SUPPLY — 20 items
BALLN SAPPHIRE 2.5X15 (BALLOONS) ×2
BALLN SAPPHIRE ~~LOC~~ 3.5X15 (BALLOONS) ×1 IMPLANT
BALLOON SAPPHIRE 2.5X15 (BALLOONS) IMPLANT
CATH LAUNCHER 6FR EBU3.5 (CATHETERS) ×1 IMPLANT
CROWN DIAMONDBACK CLASSIC 1.25 (BURR) ×1 IMPLANT
DEVICE RAD COMP TR BAND LRG (VASCULAR PRODUCTS) ×1 IMPLANT
ELECT DEFIB PAD ADLT CADENCE (PAD) ×1 IMPLANT
GLIDESHEATH SLEND SS 6F .021 (SHEATH) ×1 IMPLANT
GUIDEWIRE INQWIRE 1.5J.035X260 (WIRE) IMPLANT
INQWIRE 1.5J .035X260CM (WIRE) ×2
KIT ENCORE 26 ADVANTAGE (KITS) ×1 IMPLANT
KIT HEART LEFT (KITS) ×2 IMPLANT
LUBRICANT VIPERSLIDE CORONARY (MISCELLANEOUS) ×1 IMPLANT
PACK CARDIAC CATHETERIZATION (CUSTOM PROCEDURE TRAY) ×2 IMPLANT
STENT SYNERGY XD 3.50X20 (Permanent Stent) IMPLANT
SYNERGY XD 3.50X20 (Permanent Stent) ×2 IMPLANT
TRANSDUCER W/STOPCOCK (MISCELLANEOUS) ×2 IMPLANT
TUBING CIL FLEX 10 FLL-RA (TUBING) ×2 IMPLANT
WIRE ASAHI PROWATER 180CM (WIRE) ×1 IMPLANT
WIRE VIPERWIRE COR FLEX .012 (WIRE) ×1 IMPLANT

## 2019-09-15 NOTE — H&P (View-Only) (Signed)
Progress Note  Patient Name: Earl Mueller Date of Encounter: 09/15/2019  [facility] Cardiologist: Jenean Lindau, MD   Subjective   No CP or dyspnea  Inpatient Medications    Scheduled Meds: . aspirin  81 mg Oral Daily  . atorvastatin  80 mg Oral Daily  . cilostazol  50 mg Oral BID  . clopidogrel  75 mg Oral Daily  . fenofibrate  160 mg Oral Daily  . heparin  5,000 Units Subcutaneous Q8H  . levothyroxine  112 mcg Oral Daily  . metoprolol succinate  25 mg Oral Daily  . PARoxetine  20 mg Oral Daily  . pneumococcal 23 valent vaccine  0.5 mL Intramuscular Tomorrow-1000  . potassium chloride  10 mEq Oral BID  . sodium chloride flush  3 mL Intravenous Once  . sodium chloride flush  3 mL Intravenous Q12H   Continuous Infusions: . sodium chloride    . sodium chloride    . sodium chloride 50 mL/hr at 09/15/19 0754  . aminophylline (Cath Lab Atherectomy) bolus     PRN Meds: sodium chloride, sodium chloride, acetaminophen, nitroGLYCERIN, ondansetron (ZOFRAN) IV, sodium chloride flush, sodium chloride flush   Vital Signs    Vitals:   09/14/19 1855 09/14/19 1918 09/14/19 2022 09/15/19 0336  BP:  104/69 107/68 99/65  Pulse: 78 77 75 86  Resp: (!) 21 (!) 22 18 16   Temp: (!) 97.4 F (36.3 C)  97.7 F (36.5 C) (!) 97.5 F (36.4 C)  TempSrc:   Oral Oral  SpO2: 92% 95% 92% 95%  Weight:    65.5 kg  Height:        Intake/Output Summary (Last 24 hours) at 09/15/2019 0818 Last data filed at 09/14/2019 2022 Gross per 24 hour  Intake 240 ml  Output --  Net 240 ml   Last 3 Weights 09/15/2019 09/14/2019 09/13/2019  Weight (lbs) 144 lb 4.8 oz 141 lb 5 oz 149 lb 6.6 oz  Weight (kg) 65.454 kg 64.1 kg 67.772 kg      Telemetry    Sinus with 3 beats NSVT - Personally Reviewed  Physical Exam   GEN: No acute distress.   Neck: No JVD Cardiac: RRR, no murmurs, rubs, or gallops.  Respiratory: Clear to auscultation bilaterally. GI: Soft, nontender, non-distended  MS: No edema;  radial cath site with no hematoma Neuro:  Nonfocal  Psych: Normal affect   Labs    High Sensitivity Troponin:   Recent Labs  Lab 09/13/19 1201 09/13/19 1825  TROPONINIHS 12 13      Chemistry Recent Labs  Lab 09/13/19 1201 09/14/19 0047 09/15/19 0321  NA 137  --  136  K 4.0  --  3.6  CL 104  --  106  CO2 22  --  21*  GLUCOSE 107*  --  94  BUN 9  --  17  CREATININE 1.08 1.05 1.35*  CALCIUM 9.2  --  8.4*  GFRNONAA >60 >60 49*  GFRAA >60 >60 56*  ANIONGAP 11  --  9     Hematology Recent Labs  Lab 09/13/19 1201 09/14/19 0047 09/15/19 0321  WBC 9.7 9.2 8.3  RBC 4.59 4.29 3.80*  HGB 15.1 14.4 12.5*  HCT 46.0 43.1 38.5*  MCV 100.2* 100.5* 101.3*  MCH 32.9 33.6 32.9  MCHC 32.8 33.4 32.5  RDW 15.1 15.2 15.2  PLT 302 294 278    BNP Recent Labs  Lab 09/13/19 1825  BNP 235.6*     DDimer  Recent Labs  Lab 09/13/19 2102  DDIMER 7.55*     Radiology    DG Chest 2 View  Result Date: 09/13/2019 CLINICAL DATA:  Chest pain EXAM: CHEST - 2 VIEW COMPARISON:  2018 FINDINGS: Emphysema with superimposed prominent interstitial changes. This appears slightly increased compared to 2018. No pleural effusion. No pneumothorax. Normal heart size. No acute osseous abnormality. IMPRESSION: Emphysema with slight increase in superimposed prominent interstitial changes. This may reflect progression of underlying interstitial lung disease or superimposed mild edema. Electronically Signed   By: Macy Mis M.D.   On: 09/13/2019 12:36   CT Angio Chest PE W/Cm &/Or Wo Cm  Result Date: 09/13/2019 CLINICAL DATA:  Chest burning and shortness of breath for 2 weeks, abnormal EKG EXAM: CT ANGIOGRAPHY CHEST WITH CONTRAST TECHNIQUE: Multidetector CT imaging of the chest was performed using the standard protocol during bolus administration of intravenous contrast. Multiplanar CT image reconstructions and MIPs were obtained to evaluate the vascular anatomy. CONTRAST:  9mL OMNIPAQUE IOHEXOL  350 MG/ML SOLN COMPARISON:  08/04/2017, 09/13/2019 FINDINGS: Cardiovascular: This is a technically adequate evaluation of the pulmonary vasculature. No filling defects or pulmonary emboli. The heart is unremarkable without pericardial effusion. There is diffuse atherosclerosis throughout the aorta and coronary vessels. Mediastinum/Nodes: Borderline enlarged mediastinal and hilar lymph nodes are seen, largest measuring 11 mm in short axis in the precarinal region. These are likely reactive. Thyroid, trachea, and esophagus are grossly unremarkable. Lungs/Pleura: Extensive emphysema is again identified, upper lobe predominant. Since the prior exam, diffuse interlobular septal thickening has developed along with trace bilateral pleural effusions. No airspace disease or pneumothorax. The central airways are patent. Upper Abdomen: No acute abnormality. Musculoskeletal: No acute or destructive bony lesions. Reconstructed images demonstrate no additional findings. Review of the MIP images confirms the above findings. IMPRESSION: 1. No evidence of pulmonary embolus. 2. Interstitial edema and trace bilateral pleural effusions, superimposed upon severe emphysema. 3. Aortic Atherosclerosis (ICD10-I70.0) and Emphysema (ICD10-J43.9). Electronically Signed   By: Randa Ngo M.D.   On: 09/13/2019 23:03   CARDIAC CATHETERIZATION  Result Date: 09/14/2019  Ost LAD to Prox LAD lesion is 85% stenosed.  Mid LAD lesion is 65% stenosed.  1st Diag lesion is 100% stenosed.  Ost Cx to Prox Cx lesion is 80% stenosed.  1st Mrg lesion is 50% stenosed.  Prox RCA lesion is 25% stenosed.  Mid RCA to Dist RCA lesion is 25% stenosed.  Post intervention, there is a 0% residual stenosis.  A drug-eluting stent was successfully placed using a STENT RESOLUTE ONYX 3.0X34.  LV end diastolic pressure is normal.  1. Severe 2 vessel obstructive CAD with left dominant circulation. Severely calcified vessels. 2. Low LV EDP 5 mm Hg 3. Successful  orbital atherectomy and stenting of the proximal LAD with DES x 1 Plan: Continue DAPT with ASA and Plavix indefinitely. Planned staged atherectomy and stenting of the proximal LCx tomorrow if stable.   ECHOCARDIOGRAM COMPLETE  Result Date: 09/14/2019    ECHOCARDIOGRAM REPORT   Patient Name:   Earl Mueller Date of Exam: 09/14/2019 Medical Rec #:  [id number]      Height:       70.0 in Accession #:    [id number]     Weight:       149.4 lb Date of Birth:  07/07/1935      BSA:          1.844 m Patient Age:    84 years       BP:  139/92 mmHg Patient Gender: M              HR:           84 bpm. Exam Location:  Inpatient Procedure: 2D Echo, Cardiac Doppler and Color Doppler Indications:    CHF-Acute Diastolic  History:        Patient has no prior history of Echocardiogram examinations. CAD                 and Angina; Risk Factors:Hypertension, Dyslipidemia and Former                 Smoker. DOE. H/o stroke.  Sonographer:    Clayton Lefort RDCS (AE) Referring Phys: [place]  Sonographer Comments: Suboptimal subcostal window. Image acquisition challenging due to respiratory motion. On stretcher in ED. IMPRESSIONS  1. Left ventricular ejection fraction by 3D volume is 39 %. The left ventricle has moderately decreased function. The left ventricle demonstrates global hypokinesis. There is severe left ventricular hypertrophy. Indeterminate diastolic filling due to E-A fusion. Lateral LV wall appears severely hypokinetic.  2. Right ventricular systolic function is normal. The right ventricular size is normal.  3. The mitral valve is degenerative. Mild mitral valve regurgitation. No evidence of mitral stenosis.  4. The aortic valve is abnormal. Aortic valve regurgitation is mild. No aortic stenosis is present. FINDINGS  Left Ventricle: Left ventricular ejection fraction by 3D volume is 39 %. The left ventricle has moderately decreased function. The left ventricle demonstrates global hypokinesis. The left  ventricular internal cavity size was normal in size. There is severe left ventricular hypertrophy. Indeterminate diastolic filling due to E-A fusion.  LV Wall Scoring: Lateral LV wall appears severely hypokinetic. Right Ventricle: The right ventricular size is normal. No increase in right ventricular wall thickness. Right ventricular systolic function is normal. Left Atrium: Left atrial size was normal in size. Right Atrium: Right atrial size was normal in size. Pericardium: There is no evidence of pericardial effusion. Presence of pericardial fat pad. Mitral Valve: Borderline mitral valve prolapse, incompletely assessed on this exam. The mitral valve is degenerative in appearance. There is mild calcification of the mitral valve leaflet(s). Mild mitral annular calcification. Mild mitral valve regurgitation. No evidence of mitral valve stenosis. Tricuspid Valve: The tricuspid valve is normal in structure. Tricuspid valve regurgitation is trivial. No evidence of tricuspid stenosis. Aortic Valve: The aortic valve is abnormal. Aortic valve regurgitation is mild. Aortic regurgitation PHT measures 415 msec. No aortic stenosis is present. There is moderate calcification of the aortic valve. Aortic valve mean gradient measures 5.0 mmHg. Aortic valve peak gradient measures 9.6 mmHg. Aortic valve area, by VTI measures 2.24 cm. Pulmonic Valve: The pulmonic valve was not well visualized. Pulmonic valve regurgitation is trivial. No evidence of pulmonic stenosis. Aorta: The aortic root is normal in size and structure. Venous: The inferior vena cava was not well visualized. IAS/Shunts: The interatrial septum was not well visualized.  LEFT VENTRICLE PLAX 2D LVIDd:         4.50 cm         Diastology LVIDs:         3.30 cm         LV e' lateral:   3.60 cm/s LV PW:         1.50 cm         LV E/e' lateral: 15.0 LV IVS:        1.70 cm  LV e' medial:    3.26 cm/s LVOT diam:     2.10 cm         LV E/e' medial:  16.5 LV SV:          65 LV SV Index:   35 LVOT Area:     3.46 cm        3D Volume EF                                LV 3D EF:    Left                                             ventricular LV Volumes (MOD)                            ejection LV vol d, MOD    73.6 ml                    fraction by A2C:                                        3D volume LV vol d, MOD    75.8 ml                    is 39 %. A4C: LV vol s, MOD    43.8 ml A2C:                           3D Volume EF: LV vol s, MOD    45.4 ml       3D EF:        39 % A4C:                           LV EDV:       95 ml LV SV MOD A2C:   29.8 ml       LV ESV:       58 ml LV SV MOD A4C:   75.8 ml       LV SV:        37 ml LV SV MOD BP:    27.5 ml RIGHT VENTRICLE RV Basal diam:  2.60 cm RV S prime:     14.00 cm/s TAPSE (M-mode): 1.8 cm LEFT ATRIUM             Index       RIGHT ATRIUM           Index LA diam:        3.40 cm 1.84 cm/m  RA Area:     11.40 cm LA Vol (A2C):   47.0 ml 25.49 ml/m RA Volume:   20.20 ml  10.95 ml/m LA Vol (A4C):   50.5 ml 27.39 ml/m LA Biplane Vol: 48.8 ml 26.46 ml/m  AORTIC VALVE AV Area (Vmax):    1.74 cm AV Area (Vmean):   1.81 cm AV Area (VTI):     2.24 cm AV Vmax:           155.00 cm/s AV Vmean:  104.000 cm/s AV VTI:            0.292 m AV Peak Grad:      9.6 mmHg AV Mean Grad:      5.0 mmHg LVOT Vmax:         77.90 cm/s LVOT Vmean:        54.400 cm/s LVOT VTI:          0.189 m LVOT/AV VTI ratio: 0.65 AI PHT:            415 msec  AORTA Ao Root diam: 3.10 cm MITRAL VALVE                TRICUSPID VALVE MV Area (PHT): 2.36 cm     TR Peak grad:   26.4 mmHg MV Decel Time: 322 msec     TR Vmax:        257.00 cm/s MV E velocity: 53.90 cm/s MV A velocity: 115.00 cm/s  SHUNTS MV E/A ratio:  0.47         Systemic VTI:  0.19 m                             Systemic Diam: 2.10 cm Cherlynn Kaiser MD Electronically signed by Cherlynn Kaiser MD Signature Date/Time: 09/14/2019/2:40:55 PM    Final     Patient Profile     84 year old male with past  medical history of coronary artery disease, hypertension, prior CVA, peripheral vascular disease, melanoma for evaluation of acute combined systolic/diastolic congestive heart failure and unstable angina.  Chest CT showed no pulmonary embolus.  There was evidence of congestive heart failure and severe emphysema.    Cardiac catheterization revealed 85% ostial LAD, 65% mid LAD, occluded diagonal, 80% circumflex and otherwise nonobstructive disease.  Patient had PCI of the LAD with drug-eluting stent.  Plan will be staged PCI of left circumflex.  Echocardiogram shows ejection fraction 39%, severe left ventricular hypertrophy, mild aortic and mitral regurgitation.  Assessment & Plan    1 UA/coronary artery disease-continue aspirin, Plavix and statin.  Patient is for PCI of left circumflex today.  2 acute combined systolic/diastolic congestive heart failure-patient is euvolemic on examination today.  Left ventricular end-diastolic pressure low at time of catheterization yesterday.  Will hold on further diuresis.  3 ischemic cardiomyopathy-LV function reduced compared to previous.  Continue Toprol.  Will add ARB later if blood pressure allows.  4 hypertension-blood pressure is borderline.  Discontinue clonidine.  Follow blood pressure and will add ARB for LV dysfunction later if blood pressure allows.  5 hyperlipidemia-continue statin.  For questions or updates, please contact [place] Please consult [email or web address] for contact info under        Signed, Kirk Ruths, MD  09/15/2019, 8:18 AM

## 2019-09-15 NOTE — Progress Notes (Signed)
Progress Note  Patient Name: Earl Mueller Date of Encounter: 09/15/2019  [facility] Cardiologist: Jenean Lindau, MD   Subjective   No CP or dyspnea  Inpatient Medications    Scheduled Meds: . aspirin  81 mg Oral Daily  . atorvastatin  80 mg Oral Daily  . cilostazol  50 mg Oral BID  . clopidogrel  75 mg Oral Daily  . fenofibrate  160 mg Oral Daily  . heparin  5,000 Units Subcutaneous Q8H  . levothyroxine  112 mcg Oral Daily  . metoprolol succinate  25 mg Oral Daily  . PARoxetine  20 mg Oral Daily  . pneumococcal 23 valent vaccine  0.5 mL Intramuscular Tomorrow-1000  . potassium chloride  10 mEq Oral BID  . sodium chloride flush  3 mL Intravenous Once  . sodium chloride flush  3 mL Intravenous Q12H   Continuous Infusions: . sodium chloride    . sodium chloride    . sodium chloride 50 mL/hr at 09/15/19 0754  . aminophylline (Cath Lab Atherectomy) bolus     PRN Meds: sodium chloride, sodium chloride, acetaminophen, nitroGLYCERIN, ondansetron (ZOFRAN) IV, sodium chloride flush, sodium chloride flush   Vital Signs    Vitals:   09/14/19 1855 09/14/19 1918 09/14/19 2022 09/15/19 0336  BP:  104/69 107/68 99/65  Pulse: 78 77 75 86  Resp: (!) 21 (!) 22 18 16   Temp: (!) 97.4 F (36.3 C)  97.7 F (36.5 C) (!) 97.5 F (36.4 C)  TempSrc:   Oral Oral  SpO2: 92% 95% 92% 95%  Weight:    65.5 kg  Height:        Intake/Output Summary (Last 24 hours) at 09/15/2019 0818 Last data filed at 09/14/2019 2022 Gross per 24 hour  Intake 240 ml  Output --  Net 240 ml   Last 3 Weights 09/15/2019 09/14/2019 09/13/2019  Weight (lbs) 144 lb 4.8 oz 141 lb 5 oz 149 lb 6.6 oz  Weight (kg) 65.454 kg 64.1 kg 67.772 kg      Telemetry    Sinus with 3 beats NSVT - Personally Reviewed  Physical Exam   GEN: No acute distress.   Neck: No JVD Cardiac: RRR, no murmurs, rubs, or gallops.  Respiratory: Clear to auscultation bilaterally. GI: Soft, nontender, non-distended  MS: No edema;  radial cath site with no hematoma Neuro:  Nonfocal  Psych: Normal affect   Labs    High Sensitivity Troponin:   Recent Labs  Lab 09/13/19 1201 09/13/19 1825  TROPONINIHS 12 13      Chemistry Recent Labs  Lab 09/13/19 1201 09/14/19 0047 09/15/19 0321  NA 137  --  136  K 4.0  --  3.6  CL 104  --  106  CO2 22  --  21*  GLUCOSE 107*  --  94  BUN 9  --  17  CREATININE 1.08 1.05 1.35*  CALCIUM 9.2  --  8.4*  GFRNONAA >60 >60 49*  GFRAA >60 >60 56*  ANIONGAP 11  --  9     Hematology Recent Labs  Lab 09/13/19 1201 09/14/19 0047 09/15/19 0321  WBC 9.7 9.2 8.3  RBC 4.59 4.29 3.80*  HGB 15.1 14.4 12.5*  HCT 46.0 43.1 38.5*  MCV 100.2* 100.5* 101.3*  MCH 32.9 33.6 32.9  MCHC 32.8 33.4 32.5  RDW 15.1 15.2 15.2  PLT 302 294 278    BNP Recent Labs  Lab 09/13/19 1825  BNP 235.6*     DDimer  Recent Labs  Lab 09/13/19 2102  DDIMER 7.55*     Radiology    DG Chest 2 View  Result Date: 09/13/2019 CLINICAL DATA:  Chest pain EXAM: CHEST - 2 VIEW COMPARISON:  2018 FINDINGS: Emphysema with superimposed prominent interstitial changes. This appears slightly increased compared to 2018. No pleural effusion. No pneumothorax. Normal heart size. No acute osseous abnormality. IMPRESSION: Emphysema with slight increase in superimposed prominent interstitial changes. This may reflect progression of underlying interstitial lung disease or superimposed mild edema. Electronically Signed   By: Macy Mis M.D.   On: 09/13/2019 12:36   CT Angio Chest PE W/Cm &/Or Wo Cm  Result Date: 09/13/2019 CLINICAL DATA:  Chest burning and shortness of breath for 2 weeks, abnormal EKG EXAM: CT ANGIOGRAPHY CHEST WITH CONTRAST TECHNIQUE: Multidetector CT imaging of the chest was performed using the standard protocol during bolus administration of intravenous contrast. Multiplanar CT image reconstructions and MIPs were obtained to evaluate the vascular anatomy. CONTRAST:  47mL OMNIPAQUE IOHEXOL  350 MG/ML SOLN COMPARISON:  08/04/2017, 09/13/2019 FINDINGS: Cardiovascular: This is a technically adequate evaluation of the pulmonary vasculature. No filling defects or pulmonary emboli. The heart is unremarkable without pericardial effusion. There is diffuse atherosclerosis throughout the aorta and coronary vessels. Mediastinum/Nodes: Borderline enlarged mediastinal and hilar lymph nodes are seen, largest measuring 11 mm in short axis in the precarinal region. These are likely reactive. Thyroid, trachea, and esophagus are grossly unremarkable. Lungs/Pleura: Extensive emphysema is again identified, upper lobe predominant. Since the prior exam, diffuse interlobular septal thickening has developed along with trace bilateral pleural effusions. No airspace disease or pneumothorax. The central airways are patent. Upper Abdomen: No acute abnormality. Musculoskeletal: No acute or destructive bony lesions. Reconstructed images demonstrate no additional findings. Review of the MIP images confirms the above findings. IMPRESSION: 1. No evidence of pulmonary embolus. 2. Interstitial edema and trace bilateral pleural effusions, superimposed upon severe emphysema. 3. Aortic Atherosclerosis (ICD10-I70.0) and Emphysema (ICD10-J43.9). Electronically Signed   By: Randa Ngo M.D.   On: 09/13/2019 23:03   CARDIAC CATHETERIZATION  Result Date: 09/14/2019  Ost LAD to Prox LAD lesion is 85% stenosed.  Mid LAD lesion is 65% stenosed.  1st Diag lesion is 100% stenosed.  Ost Cx to Prox Cx lesion is 80% stenosed.  1st Mrg lesion is 50% stenosed.  Prox RCA lesion is 25% stenosed.  Mid RCA to Dist RCA lesion is 25% stenosed.  Post intervention, there is a 0% residual stenosis.  A drug-eluting stent was successfully placed using a STENT RESOLUTE ONYX 3.0X34.  LV end diastolic pressure is normal.  1. Severe 2 vessel obstructive CAD with left dominant circulation. Severely calcified vessels. 2. Low LV EDP 5 mm Hg 3. Successful  orbital atherectomy and stenting of the proximal LAD with DES x 1 Plan: Continue DAPT with ASA and Plavix indefinitely. Planned staged atherectomy and stenting of the proximal LCx tomorrow if stable.   ECHOCARDIOGRAM COMPLETE  Result Date: 09/14/2019    ECHOCARDIOGRAM REPORT   Patient Name:   Earl Mueller Date of Exam: 09/14/2019 Medical Rec #:  [id number]      Height:       70.0 in Accession #:    [id number]     Weight:       149.4 lb Date of Birth:  17-Feb-1936      BSA:          1.844 m Patient Age:    62 years       BP:  139/92 mmHg Patient Gender: M              HR:           84 bpm. Exam Location:  Inpatient Procedure: 2D Echo, Cardiac Doppler and Color Doppler Indications:    CHF-Acute Diastolic  History:        Patient has no prior history of Echocardiogram examinations. CAD                 and Angina; Risk Factors:Hypertension, Dyslipidemia and Former                 Smoker. DOE. H/o stroke.  Sonographer:    Clayton Lefort RDCS (AE) Referring Phys: [place]  Sonographer Comments: Suboptimal subcostal window. Image acquisition challenging due to respiratory motion. On stretcher in ED. IMPRESSIONS  1. Left ventricular ejection fraction by 3D volume is 39 %. The left ventricle has moderately decreased function. The left ventricle demonstrates global hypokinesis. There is severe left ventricular hypertrophy. Indeterminate diastolic filling due to E-A fusion. Lateral LV wall appears severely hypokinetic.  2. Right ventricular systolic function is normal. The right ventricular size is normal.  3. The mitral valve is degenerative. Mild mitral valve regurgitation. No evidence of mitral stenosis.  4. The aortic valve is abnormal. Aortic valve regurgitation is mild. No aortic stenosis is present. FINDINGS  Left Ventricle: Left ventricular ejection fraction by 3D volume is 39 %. The left ventricle has moderately decreased function. The left ventricle demonstrates global hypokinesis. The left  ventricular internal cavity size was normal in size. There is severe left ventricular hypertrophy. Indeterminate diastolic filling due to E-A fusion.  LV Wall Scoring: Lateral LV wall appears severely hypokinetic. Right Ventricle: The right ventricular size is normal. No increase in right ventricular wall thickness. Right ventricular systolic function is normal. Left Atrium: Left atrial size was normal in size. Right Atrium: Right atrial size was normal in size. Pericardium: There is no evidence of pericardial effusion. Presence of pericardial fat pad. Mitral Valve: Borderline mitral valve prolapse, incompletely assessed on this exam. The mitral valve is degenerative in appearance. There is mild calcification of the mitral valve leaflet(s). Mild mitral annular calcification. Mild mitral valve regurgitation. No evidence of mitral valve stenosis. Tricuspid Valve: The tricuspid valve is normal in structure. Tricuspid valve regurgitation is trivial. No evidence of tricuspid stenosis. Aortic Valve: The aortic valve is abnormal. Aortic valve regurgitation is mild. Aortic regurgitation PHT measures 415 msec. No aortic stenosis is present. There is moderate calcification of the aortic valve. Aortic valve mean gradient measures 5.0 mmHg. Aortic valve peak gradient measures 9.6 mmHg. Aortic valve area, by VTI measures 2.24 cm. Pulmonic Valve: The pulmonic valve was not well visualized. Pulmonic valve regurgitation is trivial. No evidence of pulmonic stenosis. Aorta: The aortic root is normal in size and structure. Venous: The inferior vena cava was not well visualized. IAS/Shunts: The interatrial septum was not well visualized.  LEFT VENTRICLE PLAX 2D LVIDd:         4.50 cm         Diastology LVIDs:         3.30 cm         LV e' lateral:   3.60 cm/s LV PW:         1.50 cm         LV E/e' lateral: 15.0 LV IVS:        1.70 cm  LV e' medial:    3.26 cm/s LVOT diam:     2.10 cm         LV E/e' medial:  16.5 LV SV:          65 LV SV Index:   35 LVOT Area:     3.46 cm        3D Volume EF                                LV 3D EF:    Left                                             ventricular LV Volumes (MOD)                            ejection LV vol d, MOD    73.6 ml                    fraction by A2C:                                        3D volume LV vol d, MOD    75.8 ml                    is 39 %. A4C: LV vol s, MOD    43.8 ml A2C:                           3D Volume EF: LV vol s, MOD    45.4 ml       3D EF:        39 % A4C:                           LV EDV:       95 ml LV SV MOD A2C:   29.8 ml       LV ESV:       58 ml LV SV MOD A4C:   75.8 ml       LV SV:        37 ml LV SV MOD BP:    27.5 ml RIGHT VENTRICLE RV Basal diam:  2.60 cm RV S prime:     14.00 cm/s TAPSE (M-mode): 1.8 cm LEFT ATRIUM             Index       RIGHT ATRIUM           Index LA diam:        3.40 cm 1.84 cm/m  RA Area:     11.40 cm LA Vol (A2C):   47.0 ml 25.49 ml/m RA Volume:   20.20 ml  10.95 ml/m LA Vol (A4C):   50.5 ml 27.39 ml/m LA Biplane Vol: 48.8 ml 26.46 ml/m  AORTIC VALVE AV Area (Vmax):    1.74 cm AV Area (Vmean):   1.81 cm AV Area (VTI):     2.24 cm AV Vmax:           155.00 cm/s AV Vmean:  104.000 cm/s AV VTI:            0.292 m AV Peak Grad:      9.6 mmHg AV Mean Grad:      5.0 mmHg LVOT Vmax:         77.90 cm/s LVOT Vmean:        54.400 cm/s LVOT VTI:          0.189 m LVOT/AV VTI ratio: 0.65 AI PHT:            415 msec  AORTA Ao Root diam: 3.10 cm MITRAL VALVE                TRICUSPID VALVE MV Area (PHT): 2.36 cm     TR Peak grad:   26.4 mmHg MV Decel Time: 322 msec     TR Vmax:        257.00 cm/s MV E velocity: 53.90 cm/s MV A velocity: 115.00 cm/s  SHUNTS MV E/A ratio:  0.47         Systemic VTI:  0.19 m                             Systemic Diam: 2.10 cm Cherlynn Kaiser MD Electronically signed by Cherlynn Kaiser MD Signature Date/Time: 09/14/2019/2:40:55 PM    Final     Patient Profile     84 year old male with past  medical history of coronary artery disease, hypertension, prior CVA, peripheral vascular disease, melanoma for evaluation of acute combined systolic/diastolic congestive heart failure and unstable angina.  Chest CT showed no pulmonary embolus.  There was evidence of congestive heart failure and severe emphysema.    Cardiac catheterization revealed 85% ostial LAD, 65% mid LAD, occluded diagonal, 80% circumflex and otherwise nonobstructive disease.  Patient had PCI of the LAD with drug-eluting stent.  Plan will be staged PCI of left circumflex.  Echocardiogram shows ejection fraction 39%, severe left ventricular hypertrophy, mild aortic and mitral regurgitation.  Assessment & Plan    1 UA/coronary artery disease-continue aspirin, Plavix and statin.  Patient is for PCI of left circumflex today.  2 acute combined systolic/diastolic congestive heart failure-patient is euvolemic on examination today.  Left ventricular end-diastolic pressure low at time of catheterization yesterday.  Will hold on further diuresis.  3 ischemic cardiomyopathy-LV function reduced compared to previous.  Continue Toprol.  Will add ARB later if blood pressure allows.  4 hypertension-blood pressure is borderline.  Discontinue clonidine.  Follow blood pressure and will add ARB for LV dysfunction later if blood pressure allows.  5 hyperlipidemia-continue statin.  For questions or updates, please contact [place] Please consult [email or web address] for contact info under        Signed, Kirk Ruths, MD  09/15/2019, 8:18 AM

## 2019-09-15 NOTE — Progress Notes (Signed)
Discussed stents, Plavix, restrictions, diet, exercise, NTG and CRPII with pt and wife. Pt receptive. He has claudication sx but knows he needs to walk. Will refer to [place]. Pt c/o feeling poorly after this cath today. C/o right arm pain and grogginess. We will f/u tomorrow for ambulation.  [place], ACSM 3:18 PM 09/15/2019

## 2019-09-15 NOTE — Interval H&P Note (Signed)
History and Physical Interval Note:  09/15/2019 11:11 AM  Earl Mueller  has presented today for surgery, with the diagnosis of CAD.  The various methods of treatment have been discussed with the patient and family. After consideration of risks, benefits and other options for treatment, the patient has consented to  Procedure(s): CORONARY ATHERECTOMY (N/A) CORONARY STENT INTERVENTION (N/A) as a surgical intervention.  The patient's history has been reviewed, patient examined, no change in status, stable for surgery.  I have reviewed the patient's chart and labs.  Questions were answered to the patient's satisfaction.   Cath Lab Visit (complete for each Cath Lab visit)  Clinical Evaluation Leading to the Procedure:   ACS: Yes.    Non-ACS:    Anginal Classification: CCS III  Anti-ischemic medical therapy: Maximal Therapy (2 or more classes of medications)  Non-Invasive Test Results: No non-invasive testing performed  Prior CABG: No previous CABG        Earl Mueller [facility] 09/15/2019 11:11 AM

## 2019-09-15 NOTE — Progress Notes (Signed)
PROGRESS NOTE    Earl Mueller  UVO:[id number] DOB: 1935/07/22 DOA: 09/13/2019 PCP: Rochel Brome, MD   Brief Narrative: hpi per dr Hal Hope 09/13/2019  Earl Mueller is a 84 y.o. male with history of office CAD managed medically with history of peripheral vascular disease status post aortofemoral bypass, history of carotid endarterectomy, hypertension previous history of cigarette smoking has been experiencing exertional chest pain and shortness of breath for the last 1 month.  Since the frequency and pain has increased patient presents to the ER.  At rest patient is not having any pain.  Denies any fever chills or productive cough.  ED Course: In the ER labs show unremarkable metabolic panel and CBC high sensitive troponins were 12 and 13 with BNP of 235.6.  Covid test was negative.  EKG shows rhythm with nonspecific T changes.  Since patient had elevated D-dimer CT angiogram of the chest was done which showed description edema and mild pleural effusion.  Patient admitted for further management of exertional chest pain and shortness of breath concerning for angina.  Assessment & Plan:   Principal Problem:   Chest pain Active Problems:   Coronary artery disease involving native coronary artery of native heart without angina pectoris   Essential hypertension   Symptomatic carotid artery stenosis   Exertional dyspnea   Unstable angina (HCC)    #1  Exertional angina/systolic heart failure acute- Chest CT no pulmonary embolism, interstitial edema and trace bilateral pleural effusion superimposed upon severe emphysema.  Troponin negative BNP 235 Echocardiogram-ejection fraction 39%.  Left ventricle moderately decreased function.  Left ventricle demonstrates global hypokinesis severe left ventricular hypertrophy. Cath-Ost Cx to Prox Cx lesion is 80% stenosed. Post intervention, there is a 0% residual stenosis. A drug-eluting stent was successfully placed using a SYNERGY XD  3.50X20  Recommending dual antiplatelet agents for at least 1 year. Status post successful PCI of left circumflex.  #2 uncontrolled hypertension Earl Mueller continue Toprol.  Catapres stopped by cardiology.   #3 history of stroke and carotid endarterectomy on aspirin and statins  #4 history of COPD stable  #5 history of peripheral vascular disease on aspirin and statin  #6 hypothyroidism continue Synthroid  #7 depression on Paxil     Estimated body mass index is 20.7 kg/m as calculated from the following:   Height as of this encounter: 5\' 10"  (1.778 m).   Weight as of this encounter: 65.5 kg.  DVT prophylaxis: Lovenox Code Status-full code Family Communication: Discussed with patient none at bedside  disposition Plan:  Status is: Inpatient   Dispo: The patient is from: Home              Anticipated d/c is to: Home              Anticipated d/c date is 1 day              Patient currently is not medically stable to d/c.  Admitted with unstable angina work-up ongoing status post CABG today and PCI of the left circumflex need to observe overnight if stable discharge in a.m.   Consultants: Cardiology  Procedures: None Antimicrobials: None  Subjective: Patient resting in bed Denies any chest pain feels better  Objective: Vitals:   09/15/19 1202 09/15/19 1207 09/15/19 1211 09/15/19 1240  BP: (!) 95/55 (!) 102/59 119/75 (!) 158/70  Pulse: 70 71 69 67  Resp: 11 15 18 18   Temp:    97.7 F (36.5 C)  TempSrc:    Axillary  SpO2: 96% 97% 98% 99%  Weight:      Height:        Intake/Output Summary (Last 24 hours) at 09/15/2019 1610 Last data filed at 09/14/2019 2022 Gross per 24 hour  Intake 240 ml  Output --  Net 240 ml   Filed Weights   09/13/19 1159 09/14/19 1131 09/15/19 0336  Weight: 67.8 kg 64.1 kg 65.5 kg    Examination:  General exam: Appears calm and comfortable  Respiratory system: Crackles to auscultation. Respiratory effort normal. Cardiovascular system:  S1 & S2 heard, RRR. No JVD, murmurs, rubs, gallops or clicks. No pedal edema. Gastrointestinal system: Abdomen is nondistended, soft and nontender. No organomegaly or masses felt. Normal bowel sounds heard. Central nervous system: Alert and oriented. No focal neurological deficits. Extremities: Trace bilateral pitting edema Skin: No rashes, lesions or ulcers Psychiatry: Judgement and insight appear normal. Mood & affect appropriate.     Data Reviewed: I have personally reviewed following labs and imaging studies  CBC: Recent Labs  Lab 09/13/19 1201 09/14/19 0047 09/15/19 0321 09/15/19 1303  WBC 9.7 9.2 8.3 8.7  HGB 15.1 14.4 12.5* 13.7  HCT 46.0 43.1 38.5* 42.4  MCV 100.2* 100.5* 101.3* 102.9*  PLT 302 294 278 409   Basic Metabolic Panel: Recent Labs  Lab 09/13/19 1201 09/14/19 0047 09/15/19 0321 09/15/19 1303  NA 137  --  136  --   K 4.0  --  3.6  --   CL 104  --  106  --   CO2 22  --  21*  --   GLUCOSE 107*  --  94  --   BUN 9  --  17  --   CREATININE 1.08 1.05 1.35* 1.28*  CALCIUM 9.2  --  8.4*  --    GFR: Estimated Creatinine Clearance: 41.2 mL/min (A) (by C-G formula based on SCr of 1.28 mg/dL (H)). Liver Function Tests: No results for input(s): AST, ALT, ALKPHOS, BILITOT, PROT, ALBUMIN in the last 168 hours. No results for input(s): LIPASE, AMYLASE in the last 168 hours. No results for input(s): AMMONIA in the last 168 hours. Coagulation Profile: No results for input(s): INR, PROTIME in the last 168 hours. Cardiac Enzymes: No results for input(s): CKTOTAL, CKMB, CKMBINDEX, TROPONINI in the last 168 hours. BNP (last 3 results) No results for input(s): PROBNP in the last 8760 hours. HbA1C: No results for input(s): HGBA1C in the last 72 hours. CBG: No results for input(s): GLUCAP in the last 168 hours. Lipid Profile: No results for input(s): CHOL, HDL, LDLCALC, TRIG, CHOLHDL, LDLDIRECT in the last 72 hours. Thyroid Function Tests: No results for input(s):  TSH, T4TOTAL, FREET4, T3FREE, THYROIDAB in the last 72 hours. Anemia Panel: No results for input(s): VITAMINB12, FOLATE, FERRITIN, TIBC, IRON, RETICCTPCT in the last 72 hours. Sepsis Labs: No results for input(s): PROCALCITON, LATICACIDVEN in the last 168 hours.  Recent Results (from the past 240 hour(s))  SARS Coronavirus 2 by RT PCR (hospital order, performed in [facility] hospital lab) Nasopharyngeal Nasopharyngeal Swab     Status: None   Collection Time: 09/13/19  8:38 PM   Specimen: Nasopharyngeal Swab  Result Value Ref Range Status   SARS Coronavirus 2 NEGATIVE NEGATIVE Final    Comment: (NOTE) SARS-CoV-2 target nucleic acids are NOT DETECTED.  The SARS-CoV-2 RNA is generally detectable in upper and lower respiratory specimens during the acute phase of infection. The lowest concentration of SARS-CoV-2 viral copies this assay can detect is 250 copies / mL.  A negative result does not preclude SARS-CoV-2 infection and should not be used as the sole basis for treatment or other patient management decisions.  A negative result may occur with improper specimen collection / handling, submission of specimen other than nasopharyngeal swab, presence of viral mutation(s) within the areas targeted by this assay, and inadequate number of viral copies (<250 copies / mL). A negative result must be combined with clinical observations, patient history, and epidemiological information.  Fact Sheet for Patients:   StrictlyIdeas.no  Fact Sheet for Healthcare Providers: BankingDealers.co.za  This test is not yet approved or  cleared by the [place] FDA and has been authorized for detection and/or diagnosis of SARS-CoV-2 by FDA under an Emergency Use Authorization (EUA).  This EUA will remain in effect (meaning this test can be used) for the duration of the COVID-19 declaration under Section 564(b)(1) of the Act, 21 U.S.C. section  360bbb-3(b)(1), unless the authorization is terminated or revoked sooner.  Performed at [facility], [place] [address], [place], [place] [address]          Radiology Studies: CT Angio Chest PE W/Cm &/Or Wo Cm  Result Date: 09/13/2019 CLINICAL DATA:  Chest burning and shortness of breath for 2 weeks, abnormal EKG EXAM: CT ANGIOGRAPHY CHEST WITH CONTRAST TECHNIQUE: Multidetector CT imaging of the chest was performed using the standard protocol during bolus administration of intravenous contrast. Multiplanar CT image reconstructions and MIPs were obtained to evaluate the vascular anatomy. CONTRAST:  24mL OMNIPAQUE IOHEXOL 350 MG/ML SOLN COMPARISON:  08/04/2017, 09/13/2019 FINDINGS: Cardiovascular: This is a technically adequate evaluation of the pulmonary vasculature. No filling defects or pulmonary emboli. The heart is unremarkable without pericardial effusion. There is diffuse atherosclerosis throughout the aorta and coronary vessels. Mediastinum/Nodes: Borderline enlarged mediastinal and hilar lymph nodes are seen, largest measuring 11 mm in short axis in the precarinal region. These are likely reactive. Thyroid, trachea, and esophagus are grossly unremarkable. Lungs/Pleura: Extensive emphysema is again identified, upper lobe predominant. Since the prior exam, diffuse interlobular septal thickening has developed along with trace bilateral pleural effusions. No airspace disease or pneumothorax. The central airways are patent. Upper Abdomen: No acute abnormality. Musculoskeletal: No acute or destructive bony lesions. Reconstructed images demonstrate no additional findings. Review of the MIP images confirms the above findings. IMPRESSION: 1. No evidence of pulmonary embolus. 2. Interstitial edema and trace bilateral pleural effusions, superimposed upon severe emphysema. 3. Aortic Atherosclerosis (ICD10-I70.0) and Emphysema (ICD10-J43.9). Electronically Signed   By: Randa Ngo M.D.   On:  09/13/2019 23:03   CARDIAC CATHETERIZATION  Result Date: 09/15/2019  [place]on [place] Cx to Prox Cx lesion is 80% stenosed.  Post intervention, there is a 0% residual stenosis.  A drug-eluting stent was successfully placed using a SYNERGY XD 3.50X20.  1. Successful PCI of the ostial/proximal LCx with orbital atherectomy and DES x 1. Plan: DAPT for at least one year. Anticipate DC in am.   CARDIAC CATHETERIZATION  Result Date: 09/14/2019  Ost LAD to Prox LAD lesion is 85% stenosed.  Mid LAD lesion is 65% stenosed.  1st Diag lesion is 100% stenosed.  Ost Cx to Prox Cx lesion is 80% stenosed.  1st Mrg lesion is 50% stenosed.  Prox RCA lesion is 25% stenosed.  Mid RCA to Dist RCA lesion is 25% stenosed.  Post intervention, there is a 0% residual stenosis.  A drug-eluting stent was successfully placed using a STENT RESOLUTE ONYX 3.0X34.  LV end diastolic pressure is normal.  1. Severe 2  vessel obstructive CAD with left dominant circulation. Severely calcified vessels. 2. Low LV EDP 5 mm Hg 3. Successful orbital atherectomy and stenting of the proximal LAD with DES x 1 Plan: Continue DAPT with ASA and Plavix indefinitely. Planned staged atherectomy and stenting of the proximal LCx tomorrow if stable.   ECHOCARDIOGRAM COMPLETE  Result Date: 09/14/2019    ECHOCARDIOGRAM REPORT   Patient Name:   Earl Mueller Date of Exam: 09/14/2019 Medical Rec #:  [id number]      Height:       70.0 in Accession #:    [id number]     Weight:       149.4 lb Date of Birth:  [birth year]      BSA:          1.844 m Patient Age:    16 years       BP:           139/92 mmHg Patient Gender: M              HR:           84 bpm. Exam Location:  Inpatient Procedure: 2D Echo, Cardiac Doppler and Color Doppler Indications:    CHF-Acute Diastolic  History:        Patient has no prior history of Echocardiogram examinations. CAD                 and Angina; Risk Factors:Hypertension, Dyslipidemia and Former                 Smoker. DOE. H/o stroke.   Sonographer:    Clayton Lefort RDCS (AE) Referring Phys: [place]  Sonographer Comments: Suboptimal subcostal window. Image acquisition challenging due to respiratory motion. On stretcher in ED. IMPRESSIONS  1. Left ventricular ejection fraction by 3D volume is 39 %. The left ventricle has moderately decreased function. The left ventricle demonstrates global hypokinesis. There is severe left ventricular hypertrophy. Indeterminate diastolic filling due to E-A fusion. Lateral LV wall appears severely hypokinetic.  2. Right ventricular systolic function is normal. The right ventricular size is normal.  3. The mitral valve is degenerative. Mild mitral valve regurgitation. No evidence of mitral stenosis.  4. The aortic valve is abnormal. Aortic valve regurgitation is mild. No aortic stenosis is present. FINDINGS  Left Ventricle: Left ventricular ejection fraction by 3D volume is 39 %. The left ventricle has moderately decreased function. The left ventricle demonstrates global hypokinesis. The left ventricular internal cavity size was normal in size. There is severe left ventricular hypertrophy. Indeterminate diastolic filling due to E-A fusion.  LV Wall Scoring: Lateral LV wall appears severely hypokinetic. Right Ventricle: The right ventricular size is normal. No increase in right ventricular wall thickness. Right ventricular systolic function is normal. Left Atrium: Left atrial size was normal in size. Right Atrium: Right atrial size was normal in size. Pericardium: There is no evidence of pericardial effusion. Presence of pericardial fat pad. Mitral Valve: Borderline mitral valve prolapse, incompletely assessed on this exam. The mitral valve is degenerative in appearance. There is mild calcification of the mitral valve leaflet(s). Mild mitral annular calcification. Mild mitral valve regurgitation. No evidence of mitral valve stenosis. Tricuspid Valve: The tricuspid valve is normal in structure. Tricuspid  valve regurgitation is trivial. No evidence of tricuspid stenosis. Aortic Valve: The aortic valve is abnormal. Aortic valve regurgitation is mild. Aortic regurgitation PHT measures 415 msec. No aortic stenosis is present. There is moderate calcification of the aortic valve.  Aortic valve mean gradient measures 5.0 mmHg. Aortic valve peak gradient measures 9.6 mmHg. Aortic valve area, by VTI measures 2.24 cm. Pulmonic Valve: The pulmonic valve was not well visualized. Pulmonic valve regurgitation is trivial. No evidence of pulmonic stenosis. Aorta: The aortic root is normal in size and structure. Venous: The inferior vena cava was not well visualized. IAS/Shunts: The interatrial septum was not well visualized.  LEFT VENTRICLE PLAX 2D LVIDd:         4.50 cm         Diastology LVIDs:         3.30 cm         LV e' lateral:   3.60 cm/s LV PW:         1.50 cm         LV E/e' lateral: 15.0 LV IVS:        1.70 cm         LV e' medial:    3.26 cm/s LVOT diam:     2.10 cm         LV E/e' medial:  16.5 LV SV:         65 LV SV Index:   35 LVOT Area:     3.46 cm        3D Volume EF                                LV 3D EF:    Left                                             ventricular LV Volumes (MOD)                            ejection LV vol d, MOD    73.6 ml                    fraction by A2C:                                        3D volume LV vol d, MOD    75.8 ml                    is 39 %. A4C: LV vol s, MOD    43.8 ml A2C:                           3D Volume EF: LV vol s, MOD    45.4 ml       3D EF:        39 % A4C:                           LV EDV:       95 ml LV SV MOD A2C:   29.8 ml       LV ESV:       58 ml LV SV MOD A4C:   75.8 ml       LV SV:        37 ml LV SV MOD BP:  27.5 ml RIGHT VENTRICLE RV Basal diam:  2.60 cm RV S prime:     14.00 cm/s TAPSE (M-mode): 1.8 cm LEFT ATRIUM             Index       RIGHT ATRIUM           Index LA diam:        3.40 cm 1.84 cm/m  RA Area:     11.40 cm LA Vol (A2C):   47.0 ml  25.49 ml/m RA Volume:   20.20 ml  10.95 ml/m LA Vol (A4C):   50.5 ml 27.39 ml/m LA Biplane Vol: 48.8 ml 26.46 ml/m  AORTIC VALVE AV Area (Vmax):    1.74 cm AV Area (Vmean):   1.81 cm AV Area (VTI):     2.24 cm AV Vmax:           155.00 cm/s AV Vmean:          104.000 cm/s AV VTI:            0.292 m AV Peak Grad:      9.6 mmHg AV Mean Grad:      5.0 mmHg LVOT Vmax:         77.90 cm/s LVOT Vmean:        54.400 cm/s LVOT VTI:          0.189 m LVOT/AV VTI ratio: 0.65 AI PHT:            415 msec  AORTA Ao Root diam: 3.10 cm MITRAL VALVE                TRICUSPID VALVE MV Area (PHT): 2.36 cm     TR Peak grad:   26.4 mmHg MV Decel Time: 322 msec     TR Vmax:        257.00 cm/s MV E velocity: 53.90 cm/s MV A velocity: 115.00 cm/s  SHUNTS MV E/A ratio:  0.47         Systemic VTI:  0.19 m                             Systemic Diam: 2.10 cm Cherlynn Kaiser MD Electronically signed by Cherlynn Kaiser MD Signature Date/Time: 09/14/2019/2:40:55 PM    Final         Scheduled Meds: . aspirin  81 mg Oral Daily  . atorvastatin  80 mg Oral Daily  . cilostazol  50 mg Oral BID  . clopidogrel  75 mg Oral Daily  . [START ON 09/16/2019] enoxaparin (LOVENOX) injection  40 mg Subcutaneous Q24H  . fenofibrate  160 mg Oral Daily  . levothyroxine  112 mcg Oral Daily  . metoprolol succinate  25 mg Oral Daily  . PARoxetine  20 mg Oral Daily  . potassium chloride  10 mEq Oral BID  . sodium chloride flush  3 mL Intravenous Once  . sodium chloride flush  3 mL Intravenous Q12H   Continuous Infusions: . sodium chloride    . sodium chloride    . sodium chloride       LOS: 1 day     Georgette Shell, MD 09/15/2019, 4:10 PM

## 2019-09-16 ENCOUNTER — Inpatient Hospital Stay (HOSPITAL_COMMUNITY): Payer: Medicare HMO

## 2019-09-16 LAB — BASIC METABOLIC PANEL
Anion gap: 9 (ref 5–15)
BUN: 11 mg/dL (ref 8–23)
CO2: 23 mmol/L (ref 22–32)
Calcium: 8.6 mg/dL — ABNORMAL LOW (ref 8.9–10.3)
Chloride: 105 mmol/L (ref 98–111)
Creatinine, Ser: 1.17 mg/dL (ref 0.61–1.24)
GFR calc Af Amer: >60 mL/min (ref >60)
GFR calc non Af Amer: 58 mL/min — ABNORMAL LOW (ref >60)
Glucose, Bld: 105 mg/dL — ABNORMAL HIGH (ref 70–99)
Potassium: 3.7 mmol/L (ref 3.5–5.1)
Sodium: 137 mmol/L (ref 135–145)

## 2019-09-16 LAB — CBC
HCT: 39.9 % (ref 39.0–52.0)
Hemoglobin: 13.1 g/dL (ref 13.0–17.0)
MCH: 33.3 pg (ref 26.0–34.0)
MCHC: 32.8 g/dL (ref 30.0–36.0)
MCV: 101.5 fL — ABNORMAL HIGH (ref 80.0–100.0)
Platelets: 269 10*3/uL (ref 150–400)
RBC: 3.93 MIL/uL — ABNORMAL LOW (ref 4.22–5.81)
RDW: 15.1 % (ref 11.5–15.5)
WBC: 7 10*3/uL (ref 4.0–10.5)
nRBC: 0 % (ref 0.0–0.2)

## 2019-09-16 MED ORDER — METOPROLOL SUCCINATE ER 25 MG PO TB24: 25.0000 mg | ORAL_TABLET | Freq: Every day | ORAL | 4 refills | Status: DC

## 2019-09-16 MED ORDER — LOSARTAN POTASSIUM 25 MG PO TABS: 25.0000 mg | ORAL_TABLET | Freq: Every day | ORAL | 4 refills | Status: DC

## 2019-09-16 MED ORDER — LOSARTAN POTASSIUM 25 MG PO TABS
25.0000 mg | ORAL_TABLET | Freq: Every day | ORAL | Status: DC
  Administered 2019-09-16: 25 mg via ORAL
  Filled 2019-09-16: qty 1

## 2019-09-16 NOTE — Evaluation (Signed)
Physical Therapy Evaluation Patient Details Name: Earl Mueller MRN: [id number] DOB: 08-05-1935 Today's Date: 09/16/2019   History of Present Illness  Pt is an 84 year old male with past medical history of coronary artery disease, hypertension, prior CVA, peripheral vascular disease, melanoma for evaluation of acute combined systolic/diastolic congestive heart failure and unstable angina.  Chest CT showed no pulmonary embolus.  There was evidence of congestive heart failure and severe emphysema.    Cardiac catheterization revealed 85% ostial LAD, 65% mid LAD, occluded diagonal, 80% circumflex and otherwise nonobstructive disease.  Patient had PCI of the LAD with drug-eluting stent. Pt subsequently had stage PCI of Lcx.  Echocardiogram shows ejection fraction 39%, severe left ventricular hypertrophy, mild aortic and mitral regurgitation.    Clinical Impression  Pt presented supine in bed with HOB elevated, awake and willing to participate in therapy session. Pt's spouse and daughter present throughout session as well. Prior to admission, pt reported that he was independent with all functional mobility and ADLs. Pt lives with his wife in a two level house with one small step to enter (chair lift for stairs inside). At the time of evaluation, pt required min A for bed mobility, min guard for transfers and min guard for hallway ambulation without use of an AD. Of note, pt's SpO2 decreasing as low as 79% on RA with activity. Pt completely asymptomatic. He required reapplication of 2L of supplemental O2 via [place] for his SpO2 to increase back to >92%. RN was notified. HR stable and pt denying chest pain throughout. PT will continue to f/u with pt acutely to progress mobility as tolerated per PT POC.    Follow Up Recommendations No PT follow up    Equipment Recommendations  None recommended by PT    Recommendations for Other Services       Precautions / Restrictions Precautions Precautions:  Fall Restrictions Weight Bearing Restrictions: No      Mobility  Bed Mobility Overal bed mobility: Needs Assistance Bed Mobility: Supine to Sit;Sit to Supine     Supine to sit: Min assist Sit to supine: Supervision   General bed mobility comments: assistance needed for trunk elevation to achieve upright sitting at EOB  Transfers Overall transfer level: Needs assistance Equipment used: None Transfers: Sit to/from Stand Sit to Stand: Min guard         General transfer comment: min guard for safety and stability with transitional movement  Ambulation/Gait Ambulation/Gait assistance: Min guard Gait Distance (Feet): 75 Feet Assistive device: None Gait Pattern/deviations: Step-through pattern;Decreased stride length Gait velocity: decreased   General Gait Details: pt overall steady with hallway ambulation without use of an AD  Stairs            Wheelchair Mobility    Modified Rankin (Stroke Patients Only)       Balance Overall balance assessment: Needs assistance Sitting-balance support: Feet supported Sitting balance-Leahy Scale: Good     Standing balance support: During functional activity;No upper extremity supported Standing balance-Leahy Scale: Fair                               Pertinent Vitals/Pain Pain Assessment: No/denies pain    Home Living Family/patient expects to be discharged to:: Private residence Living Arrangements: Spouse/significant other Available Help at Discharge: Friend(s) Type of Home: House Home Access: Stairs to enter   [employer] of Steps: 1 Home Layout: Two level;Other (Comment) (lift chair) Home Equipment: Walker - 2  wheels;Cane - single point;Shower seat      Prior Function Level of Independence: Independent               Hand Dominance        Extremity/Trunk Assessment   Upper Extremity Assessment Upper Extremity Assessment: Overall WFL for tasks assessed    Lower  Extremity Assessment Lower Extremity Assessment: Overall WFL for tasks assessed       Communication   Communication: HOH  Cognition Arousal/Alertness: Awake/alert Behavior During Therapy: WFL for tasks assessed/performed Overall Cognitive Status: Within Functional Limits for tasks assessed                                        General Comments      Exercises     Assessment/Plan    PT Assessment Patient needs continued PT services  PT Problem List Cardiopulmonary status limiting activity;Decreased safety awareness;Decreased knowledge of precautions       PT Treatment Interventions DME instruction;Gait training;Stair training;Functional mobility training;Therapeutic activities;Therapeutic exercise;Balance training;Neuromuscular re-education;Patient/family education    PT Goals (Current goals can be found in the Care Plan section)  Acute Rehab PT Goals Patient Stated Goal: "to go home today" PT Goal Formulation: With patient/family Time For Goal Achievement: 09/30/19 Potential to Achieve Goals: Good    Frequency Min 3X/week   Barriers to discharge        Co-evaluation               AM-PAC PT "6 Clicks" Mobility  Outcome Measure Help needed turning from your back to your side while in a flat bed without using bedrails?: None Help needed moving from lying on your back to sitting on the side of a flat bed without using bedrails?: A Little Help needed moving to and from a bed to a chair (including a wheelchair)?: None Help needed standing up from a chair using your arms (e.g., wheelchair or bedside chair)?: None Help needed to walk in hospital room?: None Help needed climbing 3-5 steps with a railing? : A Little 6 Click Score: 22    End of Session Equipment Utilized During Treatment: Gait belt;Oxygen Activity Tolerance: Patient tolerated treatment well Patient left: in bed;with call bell/phone within reach;with family/visitor present Nurse  Communication: Mobility status PT Visit Diagnosis: Other abnormalities of gait and mobility (R26.89)    Time: 3825-0539 PT Time Calculation (min) (ACUTE ONLY): 18 min   Charges:   PT Evaluation $PT Eval Moderate Complexity: 1 Mod          Eduard Clos, PT, DPT  Acute Rehabilitation Services Pager [phone] Office [place] 09/16/2019, 12:54 PM

## 2019-09-16 NOTE — Discharge Summary (Signed)
Physician Discharge Summary  Earl Mueller QQP:[id number] DOB: 22-Jul-1935 DOA: 09/13/2019  PCP: Rochel Brome, MD  Admit date: 09/13/2019 Discharge date: 09/16/2019  Admitted From: Home Disposition: Home Recommendations for Outpatient Follow-up:  1. Follow up with PCP in 1-2 weeks 2. Please obtain BMP/CBC in one week 3    Please follow up with cardiologist at [facility] 4    Please check BMP 09/20/2019 to monitor renal functions and electrolytes.  He has been started on losartan which is a new medication for him.  I have also stopped his potassium tablets since he was started on losartan.  We need to make sure his electrolytes are normal.  Home Health physical therapy Equipment/Devices 3 L of oxygen Discharge Condition stable CODE STATUS: Full code Diet recommendation: Cardiac diet Brief/Interim Summary:Earl L Tuckeris a 84 y.o.malewithhistory of office CAD managed medically with history of peripheral vascular disease status post aortofemoral bypass, history of carotid endarterectomy, hypertension previous history of cigarette smoking has been experiencing exertional chest pain and shortness of breath for the last 1 month. Since the frequency and pain has increased patient presents to the ER. At rest patient is not having any pain. Denies any fever chills or productive cough.  ED Course:In the ER labs show unremarkable metabolic panel and CBC high sensitive troponins were 12 and 13 with BNP of 235.6. Covid test was negative. EKG shows rhythm with nonspecific T changes. Since patient had elevated D-dimer CT angiogram of the chest was done which showed description edema and mild pleural effusion. Patient admitted for further management of exertional chest pain and shortness of breath concerning for angina.  Discharge Diagnoses:  Principal Problem:   Chest pain Active Problems:   Coronary artery disease involving native coronary artery of native heart without angina pectoris    Essential hypertension   Symptomatic carotid artery stenosis   Exertional dyspnea   Unstable angina (HCC)   #1  Exertional angina/systolic heart failure acute-patient was initially treated with IV diuretics.  Cardiac cath showed left ventricular end-diastolic pressure low at that time Lasix was stopped. Chest CT no pulmonary embolism, interstitial edema and trace bilateral pleural effusion superimposed upon severe emphysema.   Echocardiogram-ejection fraction 39%.  Left ventricle moderately decreased function.  Left ventricle demonstrates global hypokinesis severe left ventricular hypertrophy.   Cath 09/14/2019-Ost LAD to Prox LAD lesion is 85% stenosed.  Mid LAD lesion is 65% stenosed.  1st Diag lesion is 100% stenosed.  Ost Cx to Prox Cx lesion is 80% stenosed.  1st Mrg lesion is 50% stenosed.  Prox RCA lesion is 25% stenosed.  Mid RCA to Dist RCA lesion is 25% stenosed.  Post intervention, there is a 0% residual stenosis.  A drug-eluting stent was successfully placed using a STENT RESOLUTE ONYX 3.0X34.  LV end diastolic pressure is normal.   1. Severe 2 vessel obstructive CAD with left dominant circulation. Severely calcified vessels. 2. Low LV EDP 5 mm Hg 3. Successful orbital atherectomy and stenting of the proximal LAD with DES x 1  Plan: Continue DAPT with ASA and Plavix indefinitely. Planned staged atherectomy and stenting of the proximal LCx tomorrow if stable.    Cath 09/15/2019-Ost Cx to Prox Cx lesion is 80% stenosed.  Post intervention, there is a 0% residual stenosis.  A drug-eluting stent was successfully placed using a SYNERGY XD 3.50X20.   1. Successful PCI of the ostial/proximal LCx with orbital atherectomy and DES x 1.  Plan: DAPT for at least one year  #2 uncontrolled hypertension  continue Toprol and ace . Catapres stopped by cardiology.   #3 history of stroke and carotid endarterectomy on aspirin and statins  #4 history of COPD-patient  ambulated on the day of discharge with sats dropping to 83% on room air.  He will be discharged on 3 L of oxygen to keep his sats above 90%.  Please reevaluate this as an outpatient.  #5 history of peripheral vascular disease on aspirin and statin  #6 hypothyroidism continue Synthroid  #7 depression on Paxil  Estimated body mass index is 20.7 kg/m as calculated from the following:   Height as of this encounter: 5\' 10"  (1.778 m).   Weight as of this encounter: 65.5 kg.  Discharge Instructions  Discharge Instructions    Amb Referral to Cardiac Rehabilitation   Complete by: As directed    Diagnosis:  Coronary Stents PTCA     After initial evaluation and assessments completed: Virtual Based Care may be provided alone or in conjunction with Phase 2 Cardiac Rehab based on patient barriers.: Yes   Diet - low sodium heart healthy   Complete by: As directed    Increase activity slowly   Complete by: As directed      Allergies as of 09/16/2019      Reactions   Other Other (See Comments)   Any narcotic makes him a "wild man"  Delusions (intolerance)      Medication List    STOP taking these medications   cloNIDine 0.1 MG tablet Commonly known as: CATAPRES   potassium chloride 10 MEQ tablet Commonly known as: KLOR-CON     TAKE these medications   Acetaminophen 500 MG capsule Take 1,000 mg by mouth every 4 (four) hours as needed for fever or pain.   aspirin EC 81 MG tablet Take 81 mg by mouth daily.   atorvastatin 80 MG tablet Commonly known as: LIPITOR TAKE 1 TABLET EVERY DAY   CENTRUM SILVER PO Take 1 tablet by mouth daily.   cilostazol 100 MG tablet Commonly known as: PLETAL Take 1 tablet (100 mg total) by mouth 2 (two) times daily. What changed: how much to take   clopidogrel 75 MG tablet Commonly known as: PLAVIX TAKE 1 TABLET EVERY DAY   fenofibrate 160 MG tablet Take 160 mg by mouth daily.   levothyroxine 112 MCG tablet Commonly known as:  SYNTHROID Take 1 tablet (112 mcg total) by mouth daily.   losartan 25 MG tablet Commonly known as: COZAAR Take 1 tablet (25 mg total) by mouth daily. Start taking on: September 17, 2019   metoprolol succinate 25 MG 24 hr tablet Commonly known as: TOPROL-XL Take 1 tablet (25 mg total) by mouth daily. Start taking on: September 17, 2019   nitroGLYCERIN 0.4 MG SL tablet Commonly known as: NITROSTAT Place 0.4 mg under the tongue every 5 (five) minutes as needed for chest pain.   PARoxetine 20 MG tablet Commonly known as: PAXIL Take 20 mg by mouth daily.            Durable Medical Equipment  (From admission, onward)         Start     Ordered   09/16/19 1241  DME Oxygen  Once       Question Answer Comment  Length of Need Lifetime   Mode or (Route) Nasal cannula   Liters per Minute 3   Frequency Continuous (stationary and portable oxygen unit needed)   Oxygen conserving device Yes   Oxygen delivery system Gas  09/16/19 1240          Follow-up Information    Revankar, Reita Cliche, MD Follow up in 1 week(s).   Specialty: Cardiology Why: We will arrange for follow-up and contact you. Contact information: [address] [place] [address] [phone]        Rochel Brome, MD Follow up.   Specialties: Family Medicine, Interventional Cardiology, Radiology, Anesthesiology Contact information: [address] [address] [phone]        Revankar, Reita Cliche, MD Follow up.   Specialty: Cardiology Why: Please check BMP next week 09/19/2019 Contact information: [place] [address] [phone]              Allergies  Allergen Reactions  . Other Other (See Comments)    Any narcotic makes him a "wild man"  Delusions (intolerance)    Consultations:  Cardiology   Procedures/Studies: DG Chest 1 View  Result Date: 09/16/2019 CLINICAL DATA:  Hypoxemia EXAM: CHEST  1 VIEW COMPARISON:  September 13, 2019 FINDINGS: The  mediastinal contour and cardiac silhouette are normal. Increased pulmonary interstitium is identified unchanged compared prior exam. The lungs are hyperinflated. The bony structures are stable. IMPRESSION: Chronic interstitial lung disease, unchanged compared prior exam. Electronically Signed   By: Abelardo Diesel M.D.   On: 09/16/2019 10:49   DG Chest 2 View  Result Date: 09/13/2019 CLINICAL DATA:  Chest pain EXAM: CHEST - 2 VIEW COMPARISON:  2018 FINDINGS: Emphysema with superimposed prominent interstitial changes. This appears slightly increased compared to 2018. No pleural effusion. No pneumothorax. Normal heart size. No acute osseous abnormality. IMPRESSION: Emphysema with slight increase in superimposed prominent interstitial changes. This may reflect progression of underlying interstitial lung disease or superimposed mild edema. Electronically Signed   By: Macy Mis M.D.   On: 09/13/2019 12:36   CT Angio Chest PE W/Cm &/Or Wo Cm  Result Date: 09/13/2019 CLINICAL DATA:  Chest burning and shortness of breath for 2 weeks, abnormal EKG EXAM: CT ANGIOGRAPHY CHEST WITH CONTRAST TECHNIQUE: Multidetector CT imaging of the chest was performed using the standard protocol during bolus administration of intravenous contrast. Multiplanar CT image reconstructions and MIPs were obtained to evaluate the vascular anatomy. CONTRAST:  31mL OMNIPAQUE IOHEXOL 350 MG/ML SOLN COMPARISON:  08/04/2017, 09/13/2019 FINDINGS: Cardiovascular: This is a technically adequate evaluation of the pulmonary vasculature. No filling defects or pulmonary emboli. The heart is unremarkable without pericardial effusion. There is diffuse atherosclerosis throughout the aorta and coronary vessels. Mediastinum/Nodes: Borderline enlarged mediastinal and hilar lymph nodes are seen, largest measuring 11 mm in short axis in the precarinal region. These are likely reactive. Thyroid, trachea, and esophagus are grossly unremarkable. Lungs/Pleura:  Extensive emphysema is again identified, upper lobe predominant. Since the prior exam, diffuse interlobular septal thickening has developed along with trace bilateral pleural effusions. No airspace disease or pneumothorax. The central airways are patent. Upper Abdomen: No acute abnormality. Musculoskeletal: No acute or destructive bony lesions. Reconstructed images demonstrate no additional findings. Review of the MIP images confirms the above findings. IMPRESSION: 1. No evidence of pulmonary embolus. 2. Interstitial edema and trace bilateral pleural effusions, superimposed upon severe emphysema. 3. Aortic Atherosclerosis (ICD10-I70.0) and Emphysema (ICD10-J43.9). Electronically Signed   By: Randa Ngo M.D.   On: 09/13/2019 23:03   CARDIAC CATHETERIZATION  Result Date: 09/15/2019  Colon Flattery Cx to Prox Cx lesion is 80% stenosed.  Post intervention, there is a 0% residual stenosis.  A drug-eluting stent was successfully placed  using a SYNERGY XD 3.50X20.  1. Successful PCI of the ostial/proximal LCx with orbital atherectomy and DES x 1. Plan: DAPT for at least one year. Anticipate DC in am.   CARDIAC CATHETERIZATION  Result Date: 09/14/2019  Ost LAD to Prox LAD lesion is 85% stenosed.  Mid LAD lesion is 65% stenosed.  1st Diag lesion is 100% stenosed.  Ost Cx to Prox Cx lesion is 80% stenosed.  1st Mrg lesion is 50% stenosed.  Prox RCA lesion is 25% stenosed.  Mid RCA to Dist RCA lesion is 25% stenosed.  Post intervention, there is a 0% residual stenosis.  A drug-eluting stent was successfully placed using a STENT RESOLUTE ONYX 3.0X34.  LV end diastolic pressure is normal.  1. Severe 2 vessel obstructive CAD with left dominant circulation. Severely calcified vessels. 2. Low LV EDP 5 mm Hg 3. Successful orbital atherectomy and stenting of the proximal LAD with DES x 1 Plan: Continue DAPT with ASA and Plavix indefinitely. Planned staged atherectomy and stenting of the proximal LCx tomorrow if stable.    ECHOCARDIOGRAM COMPLETE  Result Date: 09/14/2019    ECHOCARDIOGRAM REPORT   Patient Name:   Earl Mueller Date of Exam: 09/14/2019 Medical Rec #:  [id number]      Height:       70.0 in Accession #:    [id number]     Weight:       149.4 lb Date of Birth:  12/11/1935      BSA:          1.844 m Patient Age:    25 years       BP:           139/92 mmHg Patient Gender: M              HR:           84 bpm. Exam Location:  Inpatient Procedure: 2D Echo, Cardiac Doppler and Color Doppler Indications:    CHF-Acute Diastolic  History:        Patient has no prior history of Echocardiogram examinations. CAD                 and Angina; Risk Factors:Hypertension, Dyslipidemia and Former                 Smoker. DOE. H/o stroke.  Sonographer:    Clayton Lefort RDCS (AE) Referring Phys: [place]  Sonographer Comments: Suboptimal subcostal window. Image acquisition challenging due to respiratory motion. On stretcher in ED. IMPRESSIONS  1. Left ventricular ejection fraction by 3D volume is 39 %. The left ventricle has moderately decreased function. The left ventricle demonstrates global hypokinesis. There is severe left ventricular hypertrophy. Indeterminate diastolic filling due to E-A fusion. Lateral LV wall appears severely hypokinetic.  2. Right ventricular systolic function is normal. The right ventricular size is normal.  3. The mitral valve is degenerative. Mild mitral valve regurgitation. No evidence of mitral stenosis.  4. The aortic valve is abnormal. Aortic valve regurgitation is mild. No aortic stenosis is present. FINDINGS  Left Ventricle: Left ventricular ejection fraction by 3D volume is 39 %. The left ventricle has moderately decreased function. The left ventricle demonstrates global hypokinesis. The left ventricular internal cavity size was normal in size. There is severe left ventricular hypertrophy. Indeterminate diastolic filling due to E-A fusion.  LV Wall Scoring: Lateral LV wall appears severely  hypokinetic. Right Ventricle: The right ventricular size is normal. No increase in right ventricular  wall thickness. Right ventricular systolic function is normal. Left Atrium: Left atrial size was normal in size. Right Atrium: Right atrial size was normal in size. Pericardium: There is no evidence of pericardial effusion. Presence of pericardial fat pad. Mitral Valve: Borderline mitral valve prolapse, incompletely assessed on this exam. The mitral valve is degenerative in appearance. There is mild calcification of the mitral valve leaflet(s). Mild mitral annular calcification. Mild mitral valve regurgitation. No evidence of mitral valve stenosis. Tricuspid Valve: The tricuspid valve is normal in structure. Tricuspid valve regurgitation is trivial. No evidence of tricuspid stenosis. Aortic Valve: The aortic valve is abnormal. Aortic valve regurgitation is mild. Aortic regurgitation PHT measures 415 msec. No aortic stenosis is present. There is moderate calcification of the aortic valve. Aortic valve mean gradient measures 5.0 mmHg. Aortic valve peak gradient measures 9.6 mmHg. Aortic valve area, by VTI measures 2.24 cm. Pulmonic Valve: The pulmonic valve was not well visualized. Pulmonic valve regurgitation is trivial. No evidence of pulmonic stenosis. Aorta: The aortic root is normal in size and structure. Venous: The inferior vena cava was not well visualized. IAS/Shunts: The interatrial septum was not well visualized.  LEFT VENTRICLE PLAX 2D LVIDd:         4.50 cm         Diastology LVIDs:         3.30 cm         LV e' lateral:   3.60 cm/s LV PW:         1.50 cm         LV E/e' lateral: 15.0 LV IVS:        1.70 cm         LV e' medial:    3.26 cm/s LVOT diam:     2.10 cm         LV E/e' medial:  16.5 LV SV:         65 LV SV Index:   35 LVOT Area:     3.46 cm        3D Volume EF                                LV 3D EF:    Left                                             ventricular LV Volumes (MOD)                             ejection LV vol d, MOD    73.6 ml                    fraction by A2C:                                        3D volume LV vol d, MOD    75.8 ml                    is 39 %. A4C: LV vol s, MOD    43.8 ml A2C:  3D Volume EF: LV vol s, MOD    45.4 ml       3D EF:        39 % A4C:                           LV EDV:       95 ml LV SV MOD A2C:   29.8 ml       LV ESV:       58 ml LV SV MOD A4C:   75.8 ml       LV SV:        37 ml LV SV MOD BP:    27.5 ml RIGHT VENTRICLE RV Basal diam:  2.60 cm RV S prime:     14.00 cm/s TAPSE (M-mode): 1.8 cm LEFT ATRIUM             Index       RIGHT ATRIUM           Index LA diam:        3.40 cm 1.84 cm/m  RA Area:     11.40 cm LA Vol (A2C):   47.0 ml 25.49 ml/m RA Volume:   20.20 ml  10.95 ml/m LA Vol (A4C):   50.5 ml 27.39 ml/m LA Biplane Vol: 48.8 ml 26.46 ml/m  AORTIC VALVE AV Area (Vmax):    1.74 cm AV Area (Vmean):   1.81 cm AV Area (VTI):     2.24 cm AV Vmax:           155.00 cm/s AV Vmean:          104.000 cm/s AV VTI:            0.292 m AV Peak Grad:      9.6 mmHg AV Mean Grad:      5.0 mmHg LVOT Vmax:         77.90 cm/s LVOT Vmean:        54.400 cm/s LVOT VTI:          0.189 m LVOT/AV VTI ratio: 0.65 AI PHT:            415 msec  AORTA Ao Root diam: 3.10 cm MITRAL VALVE                TRICUSPID VALVE MV Area (PHT): 2.36 cm     TR Peak grad:   26.4 mmHg MV Decel Time: 322 msec     TR Vmax:        257.00 cm/s MV E velocity: 53.90 cm/s MV A velocity: 115.00 cm/s  SHUNTS MV E/A ratio:  0.47         Systemic VTI:  0.19 m                             Systemic Diam: 2.10 cm Cherlynn Kaiser MD Electronically signed by Cherlynn Kaiser MD Signature Date/Time: 09/14/2019/2:40:55 PM    Final     (Echo, Carotid, EGD, Colonoscopy, ERCP)    Subjective: Bed in no acute distress complains of some dyspnea on exertion stat chest x-ray showed no evidence of pleural effusion or fluid overload.  Interstitial lung disease noted.  On the day of discharge  ambulation sats dropped to 83%.  Discharge Exam: Vitals:   09/16/19 0822 09/16/19 0836  BP: (!) 153/82   Pulse: 84   Resp: 20 19  Temp:  97.8  F (36.6 C)  SpO2:  92%   Vitals:   09/15/19 2050 09/16/19 0329 09/16/19 0822 09/16/19 0836  BP:   (!) 153/82   Pulse:  85 84   Resp: 15 18 20 19   Temp:  98 F (36.7 C)  97.8 F (36.6 C)  TempSrc:  Other (Comment) Oral Oral  SpO2:  100%  92%  Weight:      Height:        General: Pt is alert, awake, not in acute distress Cardiovascular: RRR, S1/S2 +, no rubs, no gallops Respiratory: CTA bilaterally, no wheezing, no rhonchi Abdominal: Soft, NT, ND, bowel sounds + Extremities: no edema, no cyanosis    The results of significant diagnostics from this hospitalization (including imaging, microbiology, ancillary and laboratory) are listed below for reference.     Microbiology: Recent Results (from the past 240 hour(s))  SARS Coronavirus 2 by RT PCR (hospital order, performed in [facility] hospital lab) Nasopharyngeal Nasopharyngeal Swab     Status: None   Collection Time: 09/13/19  8:38 PM   Specimen: Nasopharyngeal Swab  Result Value Ref Range Status   SARS Coronavirus 2 NEGATIVE NEGATIVE Final    Comment: (NOTE) SARS-CoV-2 target nucleic acids are NOT DETECTED.  The SARS-CoV-2 RNA is generally detectable in upper and lower respiratory specimens during the acute phase of infection. The lowest concentration of SARS-CoV-2 viral copies this assay can detect is 250 copies / mL. A negative result does not preclude SARS-CoV-2 infection and should not be used as the sole basis for treatment or other patient management decisions.  A negative result may occur with improper specimen collection / handling, submission of specimen other than nasopharyngeal swab, presence of viral mutation(s) within the areas targeted by this assay, and inadequate number of viral copies (<250 copies / mL). A negative result must be combined with  clinical observations, patient history, and epidemiological information.  Fact Sheet for Patients:   StrictlyIdeas.no  Fact Sheet for Healthcare Providers: BankingDealers.co.za  This test is not yet approved or  cleared by the [place] FDA and has been authorized for detection and/or diagnosis of SARS-CoV-2 by FDA under an Emergency Use Authorization (EUA).  This EUA will remain in effect (meaning this test can be used) for the duration of the COVID-19 declaration under Section 564(b)(1) of the Act, 21 U.S.C. section 360bbb-3(b)(1), unless the authorization is terminated or revoked sooner.  Performed at [facility], [place] [address], [place], [place] [address]      Labs: BNP (last 3 results) Recent Labs    09/13/19 1825  BNP 751.0*   Basic Metabolic Panel: Recent Labs  Lab 09/13/19 1201 09/14/19 0047 09/15/19 0321 09/15/19 1303 09/16/19 0431  NA 137  --  136  --  137  K 4.0  --  3.6  --  3.7  CL 104  --  106  --  105  CO2 22  --  21*  --  23  GLUCOSE 107*  --  94  --  105*  BUN 9  --  17  --  11  CREATININE 1.08 1.05 1.35* 1.28* 1.17  CALCIUM 9.2  --  8.4*  --  8.6*   Liver Function Tests: No results for input(s): AST, ALT, ALKPHOS, BILITOT, PROT, ALBUMIN in the last 168 hours. No results for input(s): LIPASE, AMYLASE in the last 168 hours. No results for input(s): AMMONIA in the last 168 hours. CBC: Recent Labs  Lab 09/13/19 1201 09/14/19 0047 09/15/19  0321 09/15/19 1303 09/16/19 0431  WBC 9.7 9.2 8.3 8.7 7.0  HGB 15.1 14.4 12.5* 13.7 13.1  HCT 46.0 43.1 38.5* 42.4 39.9  MCV 100.2* 100.5* 101.3* 102.9* 101.5*  PLT 302 294 278 301 269   Cardiac Enzymes: No results for input(s): CKTOTAL, CKMB, CKMBINDEX, TROPONINI in the last 168 hours. BNP: Invalid input(s): POCBNP CBG: No results for input(s): GLUCAP in the last 168 hours. D-Dimer Recent Labs    09/13/19 2102  DDIMER 7.55*   Hgb  A1c No results for input(s): HGBA1C in the last 72 hours. Lipid Profile No results for input(s): CHOL, HDL, LDLCALC, TRIG, CHOLHDL, LDLDIRECT in the last 72 hours. Thyroid function studies No results for input(s): TSH, T4TOTAL, T3FREE, THYROIDAB in the last 72 hours.  Invalid input(s): FREET3 Anemia work up No results for input(s): VITAMINB12, FOLATE, FERRITIN, TIBC, IRON, RETICCTPCT in the last 72 hours. Urinalysis    Component Value Date/Time   COLORURINE YELLOW 03/22/2017 1422   APPEARANCEUR CLEAR 03/22/2017 1422   LABSPEC 1.017 03/22/2017 1422   PHURINE 5.0 03/22/2017 1422   GLUCOSEU NEGATIVE 03/22/2017 1422   HGBUR NEGATIVE 03/22/2017 1422   BILIRUBINUR NEGATIVE 03/22/2017 1422   KETONESUR NEGATIVE 03/22/2017 1422   PROTEINUR NEGATIVE 03/22/2017 1422   NITRITE NEGATIVE 03/22/2017 1422   LEUKOCYTESUR NEGATIVE 03/22/2017 1422   Sepsis Labs Invalid input(s): PROCALCITONIN,  WBC,  LACTICIDVEN Microbiology Recent Results (from the past 240 hour(s))  SARS Coronavirus 2 by RT PCR (hospital order, performed in [place] hospital lab) Nasopharyngeal Nasopharyngeal Swab     Status: None   Collection Time: 09/13/19  8:38 PM   Specimen: Nasopharyngeal Swab  Result Value Ref Range Status   SARS Coronavirus 2 NEGATIVE NEGATIVE Final    Comment: (NOTE) SARS-CoV-2 target nucleic acids are NOT DETECTED.  The SARS-CoV-2 RNA is generally detectable in upper and lower respiratory specimens during the acute phase of infection. The lowest concentration of SARS-CoV-2 viral copies this assay can detect is 250 copies / mL. A negative result does not preclude SARS-CoV-2 infection and should not be used as the sole basis for treatment or other patient management decisions.  A negative result may occur with improper specimen collection / handling, submission of specimen other than nasopharyngeal swab, presence of viral mutation(s) within the areas targeted by this assay, and inadequate number  of viral copies (<250 copies / mL). A negative result must be combined with clinical observations, patient history, and epidemiological information.  Fact Sheet for Patients:   StrictlyIdeas.no  Fact Sheet for Healthcare Providers: BankingDealers.co.za  This test is not yet approved or  cleared by the [place] FDA and has been authorized for detection and/or diagnosis of SARS-CoV-2 by FDA under an Emergency Use Authorization (EUA).  This EUA will remain in effect (meaning this test can be used) for the duration of the COVID-19 declaration under Section 564(b)(1) of the Act, 21 U.S.C. section 360bbb-3(b)(1), unless the authorization is terminated or revoked sooner.  Performed at [facility], [place] [address] [place], [place] [address]      Time coordinating discharge: 39 minutes  SIGNED:   Georgette Shell, MD  [facility] 09/16/2019, 12:41 PM Pager   If 7PM-7AM, please contact night-coverage [email or web address] Password [email or web address]

## 2019-09-16 NOTE — Progress Notes (Signed)
Progress Note  Patient Name: FRANCES JOYNT Date of Encounter: 09/16/2019  [facility] Cardiologist: Jenean Lindau, MD   Subjective   Mild dyspnea on exertion.  No chest pain.  Inpatient Medications    Scheduled Meds: . aspirin  81 mg Oral Daily  . atorvastatin  80 mg Oral Daily  . cilostazol  50 mg Oral BID  . clopidogrel  75 mg Oral Daily  . enoxaparin (LOVENOX) injection  40 mg Subcutaneous Q24H  . fenofibrate  160 mg Oral Daily  . levothyroxine  112 mcg Oral Daily  . metoprolol succinate  25 mg Oral Daily  . PARoxetine  20 mg Oral Daily  . potassium chloride  10 mEq Oral BID  . sodium chloride flush  3 mL Intravenous Q12H   Continuous Infusions: . sodium chloride    . sodium chloride     PRN Meds: sodium chloride, sodium chloride, acetaminophen, nitroGLYCERIN, ondansetron (ZOFRAN) IV, sodium chloride flush   Vital Signs    Vitals:   09/15/19 1240 09/15/19 1930 09/15/19 2050 09/16/19 0329  BP: (!) 158/70 135/75    Pulse: 67 70  85  Resp: 18  15 18   Temp: 97.7 F (36.5 C) 98 F (36.7 C)  98 F (36.7 C)  TempSrc: Axillary Oral  Other (Comment)  SpO2: 99% 100%  100%  Weight:      Height:       No intake or output data in the 24 hours ending 09/16/19 0815 Last 3 Weights 09/15/2019 09/14/2019 09/13/2019  Weight (lbs) 144 lb 4.8 oz 141 lb 5 oz 149 lb 6.6 oz  Weight (kg) 65.454 kg 64.1 kg 67.772 kg      Telemetry    Sinus with 6 beats nonsustained ventricular tachycardia- Personally Reviewed  Physical Exam   GEN: WD WN NAD Neck: supple Cardiac: RRR Respiratory: CTA GI: Soft, NT/ND MS: No edema; radial cath site with with small hematoma and ecchymosis.  Neurovascularly intact. Neuro:  Grossly intact  Psych: Normal affect   Labs    High Sensitivity Troponin:   Recent Labs  Lab 09/13/19 1201 09/13/19 1825  TROPONINIHS 12 13      Chemistry Recent Labs  Lab 09/13/19 1201 09/14/19 0047 09/15/19 0321 09/15/19 1303 09/16/19 0431  NA 137   --  136  --  137  K 4.0  --  3.6  --  3.7  CL 104  --  106  --  105  CO2 22  --  21*  --  23  GLUCOSE 107*  --  94  --  105*  BUN 9  --  17  --  11  CREATININE 1.08   < > 1.35* 1.28* 1.17  CALCIUM 9.2  --  8.4*  --  8.6*  GFRNONAA >60   < > 49* 52* 58*  GFRAA >60   < > 56* >60 >60  ANIONGAP 11  --  9  --  9   < > = values in this interval not displayed.     Hematology Recent Labs  Lab 09/15/19 0321 09/15/19 1303 09/16/19 0431  WBC 8.3 8.7 7.0  RBC 3.80* 4.12* 3.93*  HGB 12.5* 13.7 13.1  HCT 38.5* 42.4 39.9  MCV 101.3* 102.9* 101.5*  MCH 32.9 33.3 33.3  MCHC 32.5 32.3 32.8  RDW 15.2 15.5 15.1  PLT 278 301 269    BNP Recent Labs  Lab 09/13/19 1825  BNP 235.6*     DDimer  Recent Labs  Lab 09/13/19 2102  DDIMER 7.55*     Radiology    CARDIAC CATHETERIZATION  Result Date: 09/15/2019  Colon Flattery Cx to Prox Cx lesion is 80% stenosed.  Post intervention, there is a 0% residual stenosis.  A drug-eluting stent was successfully placed using a SYNERGY XD 3.50X20.  1. Successful PCI of the ostial/proximal LCx with orbital atherectomy and DES x 1. Plan: DAPT for at least one year. Anticipate DC in am.   CARDIAC CATHETERIZATION  Result Date: 09/14/2019  Ost LAD to Prox LAD lesion is 85% stenosed.  Mid LAD lesion is 65% stenosed.  1st Diag lesion is 100% stenosed.  Ost Cx to Prox Cx lesion is 80% stenosed.  1st Mrg lesion is 50% stenosed.  Prox RCA lesion is 25% stenosed.  Mid RCA to Dist RCA lesion is 25% stenosed.  Post intervention, there is a 0% residual stenosis.  A drug-eluting stent was successfully placed using a STENT RESOLUTE ONYX 3.0X34.  LV end diastolic pressure is normal.  1. Severe 2 vessel obstructive CAD with left dominant circulation. Severely calcified vessels. 2. Low LV EDP 5 mm Hg 3. Successful orbital atherectomy and stenting of the proximal LAD with DES x 1 Plan: Continue DAPT with ASA and Plavix indefinitely. Planned staged atherectomy and stenting of  the proximal LCx tomorrow if stable.   ECHOCARDIOGRAM COMPLETE  Result Date: 09/14/2019    ECHOCARDIOGRAM REPORT   Patient Name:   CROCKETT RALLO Date of Exam: 09/14/2019 Medical Rec #:  [id number]      Height:       70.0 in Accession #:    [id number]     Weight:       149.4 lb Date of Birth:  18-Aug-1935      BSA:          1.844 m Patient Age:    84 years       BP:           139/92 mmHg Patient Gender: M              HR:           84 bpm. Exam Location:  Inpatient Procedure: 2D Echo, Cardiac Doppler and Color Doppler Indications:    CHF-Acute Diastolic  History:        Patient has no prior history of Echocardiogram examinations. CAD                 and Angina; Risk Factors:Hypertension, Dyslipidemia and Former                 Smoker. DOE. H/o stroke.  Sonographer:    Clayton Lefort RDCS (AE) Referring Phys: [place]  Sonographer Comments: Suboptimal subcostal window. Image acquisition challenging due to respiratory motion. On stretcher in ED. IMPRESSIONS  1. Left ventricular ejection fraction by 3D volume is 39 %. The left ventricle has moderately decreased function. The left ventricle demonstrates global hypokinesis. There is severe left ventricular hypertrophy. Indeterminate diastolic filling due to E-A fusion. Lateral LV wall appears severely hypokinetic.  2. Right ventricular systolic function is normal. The right ventricular size is normal.  3. The mitral valve is degenerative. Mild mitral valve regurgitation. No evidence of mitral stenosis.  4. The aortic valve is abnormal. Aortic valve regurgitation is mild. No aortic stenosis is present. FINDINGS  Left Ventricle: Left ventricular ejection fraction by 3D volume is 39 %. The left ventricle has moderately decreased function. The left ventricle demonstrates global hypokinesis. The  left ventricular internal cavity size was normal in size. There is severe left ventricular hypertrophy. Indeterminate diastolic filling due to E-A fusion.  LV Wall Scoring:  Lateral LV wall appears severely hypokinetic. Right Ventricle: The right ventricular size is normal. No increase in right ventricular wall thickness. Right ventricular systolic function is normal. Left Atrium: Left atrial size was normal in size. Right Atrium: Right atrial size was normal in size. Pericardium: There is no evidence of pericardial effusion. Presence of pericardial fat pad. Mitral Valve: Borderline mitral valve prolapse, incompletely assessed on this exam. The mitral valve is degenerative in appearance. There is mild calcification of the mitral valve leaflet(s). Mild mitral annular calcification. Mild mitral valve regurgitation. No evidence of mitral valve stenosis. Tricuspid Valve: The tricuspid valve is normal in structure. Tricuspid valve regurgitation is trivial. No evidence of tricuspid stenosis. Aortic Valve: The aortic valve is abnormal. Aortic valve regurgitation is mild. Aortic regurgitation PHT measures 415 msec. No aortic stenosis is present. There is moderate calcification of the aortic valve. Aortic valve mean gradient measures 5.0 mmHg. Aortic valve peak gradient measures 9.6 mmHg. Aortic valve area, by VTI measures 2.24 cm. Pulmonic Valve: The pulmonic valve was not well visualized. Pulmonic valve regurgitation is trivial. No evidence of pulmonic stenosis. Aorta: The aortic root is normal in size and structure. Venous: The inferior vena cava was not well visualized. IAS/Shunts: The interatrial septum was not well visualized.  LEFT VENTRICLE PLAX 2D LVIDd:         4.50 cm         Diastology LVIDs:         3.30 cm         LV e' lateral:   3.60 cm/s LV PW:         1.50 cm         LV E/e' lateral: 15.0 LV IVS:        1.70 cm         LV e' medial:    3.26 cm/s LVOT diam:     2.10 cm         LV E/e' medial:  16.5 LV SV:         65 LV SV Index:   35 LVOT Area:     3.46 cm        3D Volume EF                                LV 3D EF:    Left                                              ventricular LV Volumes (MOD)                            ejection LV vol d, MOD    73.6 ml                    fraction by A2C:                                        3D volume LV vol d, MOD    75.8 ml  is 39 %. A4C: LV vol s, MOD    43.8 ml A2C:                           3D Volume EF: LV vol s, MOD    45.4 ml       3D EF:        39 % A4C:                           LV EDV:       95 ml LV SV MOD A2C:   29.8 ml       LV ESV:       58 ml LV SV MOD A4C:   75.8 ml       LV SV:        37 ml LV SV MOD BP:    27.5 ml RIGHT VENTRICLE RV Basal diam:  2.60 cm RV S prime:     14.00 cm/s TAPSE (M-mode): 1.8 cm LEFT ATRIUM             Index       RIGHT ATRIUM           Index LA diam:        3.40 cm 1.84 cm/m  RA Area:     11.40 cm LA Vol (A2C):   47.0 ml 25.49 ml/m RA Volume:   20.20 ml  10.95 ml/m LA Vol (A4C):   50.5 ml 27.39 ml/m LA Biplane Vol: 48.8 ml 26.46 ml/m  AORTIC VALVE AV Area (Vmax):    1.74 cm AV Area (Vmean):   1.81 cm AV Area (VTI):     2.24 cm AV Vmax:           155.00 cm/s AV Vmean:          104.000 cm/s AV VTI:            0.292 m AV Peak Grad:      9.6 mmHg AV Mean Grad:      5.0 mmHg LVOT Vmax:         77.90 cm/s LVOT Vmean:        54.400 cm/s LVOT VTI:          0.189 m LVOT/AV VTI ratio: 0.65 AI PHT:            415 msec  AORTA Ao Root diam: 3.10 cm MITRAL VALVE                TRICUSPID VALVE MV Area (PHT): 2.36 cm     TR Peak grad:   26.4 mmHg MV Decel Time: 322 msec     TR Vmax:        257.00 cm/s MV E velocity: 53.90 cm/s MV A velocity: 115.00 cm/s  SHUNTS MV E/A ratio:  0.47         Systemic VTI:  0.19 m                             Systemic Diam: 2.10 cm Cherlynn Kaiser MD Electronically signed by Cherlynn Kaiser MD Signature Date/Time: 09/14/2019/2:40:55 PM    Final     Patient Profile     84 year old male with past medical history of coronary artery disease, hypertension, prior CVA, peripheral vascular disease, melanoma for evaluation of acute combined systolic/diastolic  congestive heart failure and unstable angina.  Chest CT showed no pulmonary embolus.  There was evidence of congestive heart failure and severe emphysema.    Cardiac catheterization revealed 85% ostial LAD, 65% mid LAD, occluded diagonal, 80% circumflex and otherwise nonobstructive disease.  Patient had PCI of the LAD with drug-eluting stent. Pt subsequently had stage PCI of Lcx.  Echocardiogram shows ejection fraction 39%, severe left ventricular hypertrophy, mild aortic and mitral regurgitation.  Assessment & Plan    1 UA/coronary artery disease-status post PCI of LAD and left circumflex.  No chest pain.  Continue aspirin, Plavix and statin.    2 acute combined systolic/diastolic congestive heart failure-left ventricular end-diastolic pressure low at time of catheterization.  Will hold on further diuresis.  3 ischemic cardiomyopathy-LV function reduced compared to previous.  Continue Toprol.  Add losartan 25 mg daily.  Increase medications as tolerated as an outpatient.  4 hypertension-blood pressure is mildly elevated this morning.  Continue Toprol.  Add losartan.  Follow-up as an outpatient and adjust medications as needed.  5 hyperlipidemia-continue statin.  6 severe COPD-noted on recent chest CT.  Patient ambulated this morning and his saturations decreased to 84%.  May need home oxygen.  We will leave to primary care.  Patient can be discharged from a cardiac standpoint.  Would arrange follow-up with Dr. Geraldo Pitter in [place] in approximately 1 week.  Check potassium and renal function at that time.  We will sign off.  Please call with questions.  For questions or updates, please contact [place] Please consult [email or web address] for contact info under        Signed, Kirk Ruths, MD  09/16/2019, 8:15 AM

## 2019-09-16 NOTE — Progress Notes (Signed)
SATURATION QUALIFICATIONS: (This note is used to comply with regulatory documentation for home oxygen)  Patient Saturations on Room Air at Rest = 92 %  Patient Saturations on Room Air while Ambulating =  83 %  Patient Saturations on  3  Liters of oxygen while Ambulating =  93 %  Please briefly explain why patient needs home oxygen:

## 2019-09-16 NOTE — Progress Notes (Signed)
CARDIAC REHAB PHASE I   PRE:  Rate/Rhythm: NSR/89  BP:  Sitting: 142/79       SaO2: 93% RA  MODE:  Ambulation: 300 ft SaO2 : 84% RA  HR: 110  POST:  Rate/Rhythm: ST/101  BP:  Sitting: 160/79      SaO2: 92 RA  [id number]  Pt received in bed and agrees to ambulate. Pt assisted to bathroom. Pt voices that he normally has low SaO2. Gait belt used, pt ambulates with steady gait. Pt was noted to desaturate to 84 % on RA. Pt voices feeling SOB. Pt assisted back to bedside. Pt denies any other complaints, just the SOB when walking. The pt's SaO2 was reported to the primary RN and the physician. Pt in bed with call bell in reach.   Lesly Rubenstein, MS, ACSM EP-C, [facility] 09/16/2019  8:29 AM

## 2019-09-16 NOTE — Progress Notes (Signed)
Patient had a 6 beat run of V Tach.  Patient was asleep and was asymptomatic.

## 2019-09-16 NOTE — TOC Transition Note (Signed)
Transition of Care [facility]) - CM/SW Discharge Note   Patient Details  Name: Earl Mueller MRN: [id number] Date of Birth: 16-Dec-1935  Transition of Care [facility]) CM/SW Contact:  Bartholomew Crews, RN Phone Number: [phone] 09/16/2019, 2:09 PM   Clinical Narrative:     Spoke with patient, spouse, and daughter at bedside. Discussed need for oxygen. Offered choice of DME agency. Referral called to [facility] ([employer]). DME order and qualifying note faxed to [phone]. [employer] to deliver travel tank to bedside. Discussed need for [facility] PT - declined. PT evaluation recommendation no f/u. No further TOC needs identified.   Final next level of care: Home/Self Care Barriers to Discharge: No Barriers Identified   Patient Goals and CMS Choice Patient states their goals for this hospitalization and ongoing recovery are:: return home CMS Medicare.gov Compare Post Acute Care list provided to:: Patient Choice offered to / list presented to : Patient  Discharge Placement                       Discharge Plan and Services                DME Arranged: Oxygen DME Agency: [facility] Date DME Agency Contacted: 09/16/19 Time DME Agency Contacted: [phone]   [place] Arranged: NA HH Agency: NA        Social Determinants of Health ([place]) Interventions     Readmission Risk Interventions No flowsheet data found.

## 2019-09-20 ENCOUNTER — Encounter: Payer: Self-pay | Admitting: Cardiology

## 2019-09-20 ENCOUNTER — Ambulatory Visit (INDEPENDENT_AMBULATORY_CARE_PROVIDER_SITE_OTHER): Payer: Medicare HMO | Admitting: Cardiology

## 2019-09-20 ENCOUNTER — Other Ambulatory Visit: Payer: Self-pay

## 2019-09-20 VITALS — BP 166/88 | HR 100 | Ht 70.0 in | Wt 147.6 lb

## 2019-09-20 DIAGNOSIS — Z8673 Personal history of transient ischemic attack (TIA), and cerebral infarction without residual deficits: Secondary | ICD-10-CM | POA: Diagnosis not present

## 2019-09-20 DIAGNOSIS — I10 Essential (primary) hypertension: Secondary | ICD-10-CM

## 2019-09-20 DIAGNOSIS — E785 Hyperlipidemia, unspecified: Secondary | ICD-10-CM

## 2019-09-20 DIAGNOSIS — I2510 Atherosclerotic heart disease of native coronary artery without angina pectoris: Secondary | ICD-10-CM

## 2019-09-20 NOTE — Progress Notes (Signed)
Cardiology Office Note:    Date:  09/20/2019   ID:  Earl Mueller, DOB 11-Jul-1935, MRN [id number]  PCP:  Rochel Brome, MD  Cardiologist:  Jenean Lindau, MD   Referring MD: Rochel Brome, MD    ASSESSMENT:    1. Coronary artery disease involving native coronary artery of native heart without angina pectoris   2. Essential hypertension   3. Dyslipidemia   4. History of stroke    PLAN:    In order of problems listed above:  1. Coronary artery disease: Secondary prevention stressed with the patient.  Importance of compliance with diet medication stressed and he vocalized understanding.  Coronary angiography report was detailed to him at length.  Questions were answered to his satisfaction.  I reviewed the details of this extensively. 2. Essential hypertension: Blood pressure stable 3. Mixed dyslipidemia: Patient on statin therapy and we will keep a track of it.  He will be back in a month for blood work and will be seen in follow-up appointment in 3 months or earlier if has any concerns.  Patient and wife had multiple questions which were answered to their satisfaction.  They are oxygen use and other issues will be followed by primary care physician.   Medication Adjustments/Labs and Tests Ordered: Current medicines are reviewed at length with the patient today.  Concerns regarding medicines are outlined above.  No orders of the defined types were placed in this encounter.  No orders of the defined types were placed in this encounter.    No chief complaint on file.    History of Present Illness:    Earl Mueller is a 84 y.o. male.  Patient has past medical history of coronary artery disease, essential hypertension dyslipidemia diabetes mellitus.  He was admitted to the hospital with shortness of breath on exertion and underwent coronary angiography and intervention.  The details are mentioned below.  Currently is fine.  He has been told to be on oxygen especially when he exerts  himself and he is following this instructions meticulously.  At the time of my evaluation, the patient is alert awake oriented and in no distress.  Past Medical History:  Diagnosis Date  . Anxiety   . Arthritis   . Cancer ([place])    skin  . Carotid artery stenosis 03/26/2017  . Coronary artery disease involving native coronary artery of native heart without angina pectoris 11/20/2014  . Dyslipidemia 11/20/2014  . Essential hypertension 11/20/2014   Dr Geraldo Pitter, [place]  . Hypothyroidism   . Peripheral vascular disease ([place])   . Pre-operative clearance 01/18/2017  . Skin ulcer of scalp ([place]) 05/10/2013  . 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 Symptomatic carotid artery stenosis 03/05/2017    Past Surgical History:  Procedure Laterality Date  . BACK SURGERY    . CARDIAC CATHETERIZATION  03/06/10   NL-LM, 40%pLAD, 30% mid/distal LAD, 98% ostial DIAG (small), 30%pCx, 50%mCx, 30%dCx, 30% OM2, 40%dRCA; Med RX rec ([facility])  . caritid endart    . CORONARY ATHERECTOMY N/A 09/14/2019   Procedure: CORONARY ATHERECTOMY;  Surgeon: Martinique, Peter M, MD;  Location: [place] CV LAB;  Service: Cardiovascular;  Laterality: N/A;  . CORONARY ATHERECTOMY N/A 09/15/2019   Procedure: CORONARY ATHERECTOMY;  Surgeon: Martinique, Peter M, MD;  Location: [place] CV LAB;  Service: Cardiovascular;  Laterality: N/A;  . CORONARY STENT INTERVENTION N/A 09/14/2019   Procedure: CORONARY STENT INTERVENTION;  Surgeon: Martinique, Peter M, MD;  Location: [place] [facility];  Service: Cardiovascular;  Laterality: N/A;  . CORONARY STENT INTERVENTION N/A 09/15/2019   Procedure: CORONARY STENT INTERVENTION;  Surgeon: Martinique, Peter M, MD;  Location: [place] CV LAB;  Service: Cardiovascular;  Laterality: N/A;  . ENDARTERECTOMY Left 03/26/2017   Procedure: ENDARTERECTOMY CAROTID LEFT;  Surgeon: Rosetta Posner, MD;  Location: [facility] OR;  Service: Vascular;  Laterality: Left;  . femerao bypass    . LEFT HEART CATH AND  CORONARY ANGIOGRAPHY N/A 02/03/2017   Procedure: LEFT HEART CATH AND CORONARY ANGIOGRAPHY;  Surgeon: Martinique, Peter M, MD;  Location: [place] CV LAB;  Service: Cardiovascular;  Laterality: N/A;  . LEFT HEART CATH AND CORONARY ANGIOGRAPHY N/A 09/14/2019   Procedure: LEFT HEART CATH AND CORONARY ANGIOGRAPHY;  Surgeon: Martinique, Peter M, MD;  Location: [place] CV LAB;  Service: Cardiovascular;  Laterality: N/A;  . PATCH ANGIOPLASTY Left 03/26/2017   Procedure: PATCH ANGIOPLASTY LEFT CAROTID ARTERY;  Surgeon: Rosetta Posner, MD;  Location: [place];  Service: Vascular;  Laterality: Left;  . SCALP LACERATION REPAIR N/A 05/23/2013   Procedure: EXCISION SCALP ULCER DEBRIDEMENT OF SKIN AND BONE WITH PLACEMENT OF A-CELL;  Surgeon: Irene Limbo, MD;  Location: [place];  Service: Plastics;  Laterality: N/A;  . STOMACH SURGERY     x2  . VASCULAR SURGERY     [facility] 10/15/05    Current Medications: Current Meds  Medication Sig  . Acetaminophen 500 MG coapsule Take 1,000 mg by mouth every 4 (four) hours as needed for fever or pain.   Marland Kitchen aspirin EC 81 MG tablet Take 81 mg by mouth daily.  Marland Kitchen atorvastatin (LIPITOR) 80 MG tablet TAKE 1 TABLET EVERY DAY  . cilostazol (PLETAL) 50 MG tablet Take 50 mg by mouth 2 (two) times daily.  . clopidogrel (PLAVIX) 75 MG tablet TAKE 1 TABLET EVERY DAY  . fenofibrate 160 MG tablet Take 160 mg by mouth daily.  Marland Kitchen levothyroxine (SYNTHROID) 112 MCG tablet Take 1 tablet (112 mcg total) by mouth daily.  Marland Kitchen losartan (COZAAR) 25 MG tablet Take 1 tablet (25 mg total) by mouth daily.  . metoprolol succinate (TOPROL-XL) 25 MG 24 hr tablet Take 1 tablet (25 mg total) by mouth daily.  . Multiple Vitamins-Minerals (CENTRUM SILVER PO) Take 1 tablet by mouth daily.  . nitroGLYCERIN (NITROSTAT) 0.4 MG SL tablet Place 0.4 mg under the tongue every 5 (five) minutes as needed for chest pain.  Marland Kitchen PARoxetine (PAXIL) 20 MG tablet Take 20 mg by mouth daily.     Allergies:   Other   Social History     Socioeconomic History  . Marital status: Married    Spouse name: Not on file  . Number of children: Not on file  . Years of education: Not on file  . Highest education level: Not on file  Occupational History  . Not on file  Tobacco Use  . Smoking status: Former Smoker    Quit date: 12/18/2007    Years since quitting: 11.7  . Smokeless tobacco: Never Used  Vaping Use  . Vaping Use: Never used  Substance and Sexual Activity  . Alcohol use: Yes    Comment: occ  . Drug use: No  . Sexual activity: Not on file  Other Topics Concern  . Not on file  Social History Narrative  . Not on file   Social Determinants of Health   Financial Resource Strain:   . Difficulty of Paying Living Expenses:   Food  Insecurity: No Food Insecurity  . Worried About Charity fundraiser in the Last Year: Never true  . Ran Out of Food in the Last Year: Never true  Transportation Needs: No Transportation Needs  . Lack of Transportation (Medical): No  . Lack of Transportation (Non-Medical): No  Physical Activity: Inactive  . Days of Exercise per Week: 0 days  . Minutes of Exercise per Session: 0 min  Stress:   . Feeling of Stress :   Social Connections:   . Frequency of Communication with Friends and Family:   . Frequency of Social Gatherings with Friends and Family:   . Attends Religious Services:   . Active Member of Clubs or Organizations:   . Attends Archivist Meetings:   Marland Kitchen Marital Status:      Family History: The patient's family history includes Heart failure in his father.  ROS:   Please see the history of present illness.    All other systems reviewed and are negative.  EKGs/Labs/Other Studies Reviewed:    The following studies were reviewed today: CORONARY STENT INTERVENTION  CORONARY ATHERECTOMY  Conclusion    Ost Cx to Prox Cx lesion is 80% stenosed.  Post intervention, there is a 0% residual stenosis.  A drug-eluting stent was successfully placed using a  SYNERGY XD 3.50X20.   1. Successful PCI of the ostial/proximal LCx with orbital atherectomy and DES x 1.  Plan: DAPT for at least one year. Anticipate DC in am.     Recent Labs: 07/05/2019: ALT 25 08/22/2019: TSH 5.240 09/13/2019: B Natriuretic Peptide 235.6 09/16/2019: BUN 11; Creatinine, Ser 1.17; Hemoglobin 13.1; Platelets 269; Potassium 3.7; Sodium 137  Recent Lipid Panel    Component Value Date/Time   CHOL 148 07/05/2019 1003   TRIG 67 07/05/2019 1003   HDL 56 07/05/2019 1003   CHOLHDL 2.6 07/05/2019 1003   LDLCALC 79 07/05/2019 1003    Physical Exam:    VS:  BP (!) 166/88   Pulse 100   Ht 5\' 10"  (1.778 m)   Wt 147 lb 9.6 oz (67 kg)   SpO2 97%   BMI 21.18 kg/m     Wt Readings from Last 3 Encounters:  09/20/19 147 lb 9.6 oz (67 kg)  09/15/19 144 lb 4.8 oz (65.5 kg)  09/13/19 149 lb 4.8 oz (67.7 kg)     GEN: Patient is in no acute distress HEENT: Normal NECK: No JVD; No carotid bruits LYMPHATICS: No lymphadenopathy CARDIAC: Hear sounds regular, 2/6 systolic murmur at the apex. RESPIRATORY:  Clear to auscultation without rales, wheezing or rhonchi  ABDOMEN: Soft, non-tender, non-distended MUSCULOSKELETAL:  No edema; No deformity  SKIN: Warm and dry NEUROLOGIC:  Alert and oriented x 3 PSYCHIATRIC:  Normal affect   Signed, Jenean Lindau, MD  09/20/2019 4:50 PM    [facility]

## 2019-09-20 NOTE — Patient Instructions (Addendum)
Medication Instructions:  No medication changes. *If you need a refill on your cardiac medications before your next appointment, please call your pharmacy*   Lab Work: Your physician recommends that you return for lab work in: 1 month You need to have labs done when you are fasting.  You can come Monday through Friday 8:30 am to 12:00 pm and 1:15 to 4:30. You do not need to make an appointment as the order has already been placed. The labs you are going to have done are BMET, LFT and Lipids.   If you have labs (blood work) drawn today and your tests are completely normal, you will receive your results only by: Marland Kitchen MyChart Message (if you have MyChart) OR . A paper copy in the mail If you have any lab test that is abnormal or we need to change your treatment, we will call you to review the results.   Testing/Procedures: None ordered   Follow-Up: At [facility], you and your health needs are our priority.  As part of our continuing mission to provide you with exceptional heart care, we have created designated Provider Care Teams.  These Care Teams include your primary Cardiologist (physician) and Advanced Practice Providers (APPs -  Physician Assistants and Nurse Practitioners) who all work together to provide you with the care you need, when you need it.  We recommend signing up for the patient portal called "MyChart".  Sign up information is provided on this After Visit Summary.  MyChart is used to connect with patients for Virtual Visits (Telemedicine).  Patients are able to view lab/test results, encounter notes, upcoming appointments, etc.  Non-urgent messages can be sent to your provider as well.   To learn more about what you can do with MyChart, go to [email or web address].    Your next appointment:   3 month(s)  The format for your next appointment:   In Person  Provider:   Jyl Heinz, MD   Other Instructions NA

## 2019-09-21 ENCOUNTER — Emergency Department (HOSPITAL_COMMUNITY): Payer: Medicare HMO

## 2019-09-21 ENCOUNTER — Inpatient Hospital Stay (HOSPITAL_COMMUNITY)
Admission: EM | Admit: 2019-09-21 | Discharge: 2019-09-25 | DRG: 041 | Disposition: A | Discharge status: Home Health | Payer: Medicare HMO | Attending: Neurology | Admitting: Neurology

## 2019-09-21 ENCOUNTER — Encounter (HOSPITAL_COMMUNITY): Payer: Self-pay | Admitting: Neurology

## 2019-09-21 ENCOUNTER — Telehealth (HOSPITAL_COMMUNITY): Payer: Self-pay

## 2019-09-21 DIAGNOSIS — Z8616 Personal history of COVID-19: Secondary | ICD-10-CM

## 2019-09-21 DIAGNOSIS — Z8249 Family history of ischemic heart disease and other diseases of the circulatory system: Secondary | ICD-10-CM

## 2019-09-21 DIAGNOSIS — I25119 Atherosclerotic heart disease of native coronary artery with unspecified angina pectoris: Secondary | ICD-10-CM | POA: Diagnosis not present

## 2019-09-21 DIAGNOSIS — E876 Hypokalemia: Secondary | ICD-10-CM

## 2019-09-21 DIAGNOSIS — E039 Hypothyroidism, unspecified: Secondary | ICD-10-CM | POA: Diagnosis present

## 2019-09-21 DIAGNOSIS — E1165 Type 2 diabetes mellitus with hyperglycemia: Secondary | ICD-10-CM | POA: Diagnosis not present

## 2019-09-21 DIAGNOSIS — I255 Ischemic cardiomyopathy: Secondary | ICD-10-CM | POA: Diagnosis not present

## 2019-09-21 DIAGNOSIS — I10 Essential (primary) hypertension: Secondary | ICD-10-CM | POA: Diagnosis not present

## 2019-09-21 DIAGNOSIS — J439 Emphysema, unspecified: Secondary | ICD-10-CM | POA: Diagnosis not present

## 2019-09-21 DIAGNOSIS — Z7902 Long term (current) use of antithrombotics/antiplatelets: Secondary | ICD-10-CM

## 2019-09-21 DIAGNOSIS — D72829 Elevated white blood cell count, unspecified: Secondary | ICD-10-CM | POA: Diagnosis not present

## 2019-09-21 DIAGNOSIS — I5022 Chronic systolic (congestive) heart failure: Secondary | ICD-10-CM | POA: Diagnosis not present

## 2019-09-21 DIAGNOSIS — Z79890 Hormone replacement therapy (postmenopausal): Secondary | ICD-10-CM

## 2019-09-21 DIAGNOSIS — R29810 Facial weakness: Secondary | ICD-10-CM | POA: Diagnosis present

## 2019-09-21 DIAGNOSIS — Z8673 Personal history of transient ischemic attack (TIA), and cerebral infarction without residual deficits: Secondary | ICD-10-CM

## 2019-09-21 DIAGNOSIS — I639 Cerebral infarction, unspecified: Secondary | ICD-10-CM

## 2019-09-21 DIAGNOSIS — I6389 Other cerebral infarction: Secondary | ICD-10-CM | POA: Diagnosis not present

## 2019-09-21 DIAGNOSIS — R404 Transient alteration of awareness: Secondary | ICD-10-CM | POA: Diagnosis not present

## 2019-09-21 DIAGNOSIS — I6340 Cerebral infarction due to embolism of unspecified cerebral artery: Secondary | ICD-10-CM | POA: Diagnosis not present

## 2019-09-21 DIAGNOSIS — R471 Dysarthria and anarthria: Secondary | ICD-10-CM | POA: Diagnosis present

## 2019-09-21 DIAGNOSIS — I63412 Cerebral infarction due to embolism of left middle cerebral artery: Secondary | ICD-10-CM | POA: Diagnosis not present

## 2019-09-21 DIAGNOSIS — I110 Hypertensive heart disease with heart failure: Secondary | ICD-10-CM | POA: Diagnosis present

## 2019-09-21 DIAGNOSIS — Z885 Allergy status to narcotic agent status: Secondary | ICD-10-CM

## 2019-09-21 DIAGNOSIS — F32A Depression, unspecified: Secondary | ICD-10-CM | POA: Diagnosis present

## 2019-09-21 DIAGNOSIS — R29706 NIHSS score 6: Secondary | ICD-10-CM | POA: Diagnosis present

## 2019-09-21 DIAGNOSIS — F419 Anxiety disorder, unspecified: Secondary | ICD-10-CM | POA: Diagnosis present

## 2019-09-21 DIAGNOSIS — I69328 Other speech and language deficits following cerebral infarction: Secondary | ICD-10-CM

## 2019-09-21 DIAGNOSIS — I259 Chronic ischemic heart disease, unspecified: Secondary | ICD-10-CM | POA: Diagnosis present

## 2019-09-21 DIAGNOSIS — I739 Peripheral vascular disease, unspecified: Secondary | ICD-10-CM | POA: Diagnosis not present

## 2019-09-21 DIAGNOSIS — R29818 Other symptoms and signs involving the nervous system: Secondary | ICD-10-CM | POA: Diagnosis not present

## 2019-09-21 DIAGNOSIS — G8191 Hemiplegia, unspecified affecting right dominant side: Secondary | ICD-10-CM | POA: Diagnosis not present

## 2019-09-21 DIAGNOSIS — E785 Hyperlipidemia, unspecified: Secondary | ICD-10-CM | POA: Diagnosis present

## 2019-09-21 DIAGNOSIS — R4701 Aphasia: Secondary | ICD-10-CM

## 2019-09-21 DIAGNOSIS — Z87891 Personal history of nicotine dependence: Secondary | ICD-10-CM

## 2019-09-21 DIAGNOSIS — I25110 Atherosclerotic heart disease of native coronary artery with unstable angina pectoris: Secondary | ICD-10-CM | POA: Diagnosis present

## 2019-09-21 DIAGNOSIS — I6529 Occlusion and stenosis of unspecified carotid artery: Secondary | ICD-10-CM | POA: Diagnosis present

## 2019-09-21 DIAGNOSIS — N179 Acute kidney failure, unspecified: Secondary | ICD-10-CM | POA: Diagnosis present

## 2019-09-21 DIAGNOSIS — R4781 Slurred speech: Secondary | ICD-10-CM

## 2019-09-21 DIAGNOSIS — Z7982 Long term (current) use of aspirin: Secondary | ICD-10-CM

## 2019-09-21 DIAGNOSIS — Z79899 Other long term (current) drug therapy: Secondary | ICD-10-CM

## 2019-09-21 DIAGNOSIS — I700 Atherosclerosis of aorta: Secondary | ICD-10-CM | POA: Diagnosis present

## 2019-09-21 DIAGNOSIS — F329 Major depressive disorder, single episode, unspecified: Secondary | ICD-10-CM | POA: Diagnosis present

## 2019-09-21 DIAGNOSIS — I63419 Cerebral infarction due to embolism of unspecified middle cerebral artery: Secondary | ICD-10-CM | POA: Diagnosis not present

## 2019-09-21 DIAGNOSIS — E7800 Pure hypercholesterolemia, unspecified: Secondary | ICD-10-CM | POA: Diagnosis not present

## 2019-09-21 DIAGNOSIS — I6782 Cerebral ischemia: Secondary | ICD-10-CM | POA: Diagnosis not present

## 2019-09-21 DIAGNOSIS — R0902 Hypoxemia: Secondary | ICD-10-CM | POA: Diagnosis not present

## 2019-09-21 DIAGNOSIS — Z955 Presence of coronary angioplasty implant and graft: Secondary | ICD-10-CM

## 2019-09-21 DIAGNOSIS — I6523 Occlusion and stenosis of bilateral carotid arteries: Secondary | ICD-10-CM | POA: Diagnosis not present

## 2019-09-21 LAB — CBC
HCT: 44.8 % (ref 39.0–52.0)
Hemoglobin: 14.9 g/dL (ref 13.0–17.0)
MCH: 33.2 pg (ref 26.0–34.0)
MCHC: 33.3 g/dL (ref 30.0–36.0)
MCV: 99.8 fL (ref 80.0–100.0)
Platelets: 346 10*3/uL (ref 150–400)
RBC: 4.49 MIL/uL (ref 4.22–5.81)
RDW: 14.8 % (ref 11.5–15.5)
WBC: 8 10*3/uL (ref 4.0–10.5)
nRBC: 0 % (ref 0.0–0.2)

## 2019-09-21 LAB — DIFFERENTIAL
Abs Immature Granulocytes: 0.05 10*3/uL (ref 0.00–0.07)
Basophils Absolute: 0.1 10*3/uL (ref 0.0–0.1)
Basophils Relative: 1 %
Eosinophils Absolute: 0.4 10*3/uL (ref 0.0–0.5)
Eosinophils Relative: 5 %
Immature Granulocytes: 1 %
Lymphocytes Relative: 18 %
Lymphs Abs: 1.5 10*3/uL (ref 0.7–4.0)
Monocytes Absolute: 0.7 10*3/uL (ref 0.1–1.0)
Monocytes Relative: 9 %
Neutro Abs: 5.3 10*3/uL (ref 1.7–7.7)
Neutrophils Relative %: 66 %

## 2019-09-21 LAB — I-STAT CHEM 8, ED
BUN: 16 mg/dL (ref 8–23)
Calcium, Ion: 1.05 mmol/L — ABNORMAL LOW (ref 1.15–1.40)
Chloride: 104 mmol/L (ref 98–111)
Creatinine, Ser: 1.6 mg/dL — ABNORMAL HIGH (ref 0.61–1.24)
Glucose, Bld: 152 mg/dL — ABNORMAL HIGH (ref 70–99)
HCT: 45 % (ref 39.0–52.0)
Hemoglobin: 15.3 g/dL (ref 13.0–17.0)
Potassium: 3.6 mmol/L (ref 3.5–5.1)
Sodium: 137 mmol/L (ref 135–145)
TCO2: 19 mmol/L — ABNORMAL LOW (ref 22–32)

## 2019-09-21 LAB — URINALYSIS, ROUTINE W REFLEX MICROSCOPIC
Bilirubin Urine: NEGATIVE
Glucose, UA: 50 mg/dL — AB
Hgb urine dipstick: NEGATIVE
Ketones, ur: NEGATIVE mg/dL
Leukocytes,Ua: NEGATIVE
Nitrite: NEGATIVE
Protein, ur: NEGATIVE mg/dL
Specific Gravity, Urine: 1.029 (ref 1.005–1.030)
pH: 7 (ref 5.0–8.0)

## 2019-09-21 LAB — PROTIME-INR
INR: 1.2 (ref 0.8–1.2)
Prothrombin Time: 14.6 seconds (ref 11.4–15.2)

## 2019-09-21 LAB — COMPREHENSIVE METABOLIC PANEL
ALT: 19 U/L (ref 0–44)
AST: 33 U/L (ref 15–41)
Albumin: 3.2 g/dL — ABNORMAL LOW (ref 3.5–5.0)
Alkaline Phosphatase: 75 U/L (ref 38–126)
Anion gap: 13 (ref 5–15)
BUN: 15 mg/dL (ref 8–23)
CO2: 19 mmol/L — ABNORMAL LOW (ref 22–32)
Calcium: 9.4 mg/dL (ref 8.9–10.3)
Chloride: 106 mmol/L (ref 98–111)
Creatinine, Ser: 1.63 mg/dL — ABNORMAL HIGH (ref 0.61–1.24)
GFR calc Af Amer: 45 mL/min — ABNORMAL LOW (ref >60)
GFR calc non Af Amer: 39 mL/min — ABNORMAL LOW (ref >60)
Glucose, Bld: 154 mg/dL — ABNORMAL HIGH (ref 70–99)
Potassium: 3.6 mmol/L (ref 3.5–5.1)
Sodium: 138 mmol/L (ref 135–145)
Total Bilirubin: 1.1 mg/dL (ref 0.3–1.2)
Total Protein: 7.1 g/dL (ref 6.5–8.1)

## 2019-09-21 LAB — RAPID URINE DRUG SCREEN, HOSP PERFORMED
Amphetamines: NOT DETECTED
Barbiturates: NOT DETECTED
Benzodiazepines: NOT DETECTED
Cocaine: NOT DETECTED
Opiates: NOT DETECTED
Tetrahydrocannabinol: NOT DETECTED

## 2019-09-21 LAB — ETHANOL: Alcohol, Ethyl (B): <10 mg/dL (ref <10)

## 2019-09-21 LAB — SARS CORONAVIRUS 2 BY RT PCR (HOSPITAL ORDER, PERFORMED IN ~~LOC~~ HOSPITAL LAB): SARS Coronavirus 2: NEGATIVE

## 2019-09-21 LAB — CBG MONITORING, ED: Glucose-Capillary: 143 mg/dL — ABNORMAL HIGH (ref 70–99)

## 2019-09-21 LAB — APTT: aPTT: 28 seconds (ref 24–36)

## 2019-09-21 LAB — FIBRINOGEN: Fibrinogen: 224 mg/dL (ref 210–475)

## 2019-09-21 MED ORDER — SODIUM CHLORIDE 0.9 % IV SOLN: INTRAVENOUS | Status: DC

## 2019-09-21 MED ORDER — ACETAMINOPHEN 160 MG/5ML PO SOLN: 650.0000 mg | ORAL | Status: DC | PRN

## 2019-09-21 MED ORDER — STROKE: EARLY STAGES OF RECOVERY BOOK
Freq: Once | Status: AC
  Filled 2019-09-21: qty 1

## 2019-09-21 MED ORDER — SODIUM CHLORIDE 0.9 % IV SOLN
50.0000 mL | Freq: Once | INTRAVENOUS | Status: AC
  Administered 2019-09-21: 50 mL via INTRAVENOUS

## 2019-09-21 MED ORDER — IOHEXOL 350 MG/ML SOLN
75.0000 mL | Freq: Once | INTRAVENOUS | Status: AC | PRN
  Administered 2019-09-21: 75 mL via INTRAVENOUS

## 2019-09-21 MED ORDER — ACETAMINOPHEN 325 MG PO TABS
650.0000 mg | ORAL_TABLET | ORAL | Status: DC | PRN
  Administered 2019-09-23 – 2019-09-25 (×8): 650 mg via ORAL
  Filled 2019-09-21 (×9): qty 2

## 2019-09-21 MED ORDER — ALTEPLASE (STROKE) FULL DOSE INFUSION
0.9000 mg/kg | Freq: Once | INTRAVENOUS | Status: AC
  Administered 2019-09-21: 60.1 mg via INTRAVENOUS
  Filled 2019-09-21: qty 100

## 2019-09-21 MED ORDER — LABETALOL HCL 5 MG/ML IV SOLN
10.0000 mg | INTRAVENOUS | Status: DC | PRN
  Administered 2019-09-21: 10 mg via INTRAVENOUS
  Filled 2019-09-21: qty 4

## 2019-09-21 MED ORDER — PANTOPRAZOLE SODIUM 40 MG IV SOLR
40.0000 mg | Freq: Every day | INTRAVENOUS | Status: DC
  Administered 2019-09-21: 40 mg via INTRAVENOUS
  Filled 2019-09-21: qty 40

## 2019-09-21 MED ORDER — SENNOSIDES-DOCUSATE SODIUM 8.6-50 MG PO TABS: 1.0000 | ORAL_TABLET | Freq: Every evening | ORAL | Status: DC | PRN

## 2019-09-21 MED ORDER — ACETAMINOPHEN 650 MG RE SUPP: 650.0000 mg | RECTAL | Status: DC | PRN

## 2019-09-21 NOTE — Telephone Encounter (Signed)
Faxed referral for Phase II cardiac rehab to [email or web address]

## 2019-09-21 NOTE — H&P (Signed)
Chief Complaint: Aphasia right-sided weakness  History obtained from: Patient and Chart   HPI:                                                                                                                                       Earl Mueller is a 84 y.o. male with past medical history significant for prior stroke, carotid artery stenosis, coronary artery disease status post recent PCI on 09/15/19 presents as a code stroke to the emergency department sudden onset aphasia noted by his wife at 6:30 PM.  Last seen normal 6:30 PM, patient was with his wife when he abruptly started having difficulty getting words out.  EMS was called and patient had to have right facial droop as well as right-sided weakness.  Blood pressure was 545 systolic.  On arrival to [facility] was taken to obtain CT head.  NIH stroke scale was 6 mainly for aphasia and unable to answer questions.  CT head no showed no hemorrhage patient received IV TPA. Approach for PCI was through radial artery and therefore a compressible site.   Date last known well: [phone] Time last known well: 6:30 PM tPA Given: Yes NIHSS: 6 Baseline MRS 0   Past Medical History:  Diagnosis Date  . Anxiety   . Arthritis   . Cancer ([place])    skin  . Carotid artery stenosis 03/26/2017  . Coronary artery disease involving native coronary artery of native heart without angina pectoris 11/20/2014  . Dyslipidemia 11/20/2014  . Essential hypertension 11/20/2014   Dr Geraldo Pitter, [place]  . Hypothyroidism   . Peripheral vascular disease ([place])   . Pre-operative clearance 01/18/2017  . Skin ulcer of scalp ([place]) 05/10/2013  . 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 Symptomatic carotid artery stenosis 03/05/2017    Past Surgical History:  Procedure Laterality Date  . BACK SURGERY    . CARDIAC CATHETERIZATION  03/06/10   NL-LM, 40%pLAD, 30% mid/distal LAD, 98% ostial DIAG (small), 30%pCx, 50%mCx, 30%dCx, 30% OM2, 40%dRCA;  Med RX rec ([facility])  . caritid endart    . CORONARY ATHERECTOMY N/A 09/14/2019   Procedure: CORONARY ATHERECTOMY;  Surgeon: Martinique, Peter M, MD;  Location: [place] [facility];  Service: Cardiovascular;  Laterality: N/A;  . CORONARY ATHERECTOMY N/A 09/15/2019   Procedure: CORONARY ATHERECTOMY;  Surgeon: Martinique, Peter M, MD;  Location: [place] [facility];  Service: Cardiovascular;  Laterality: N/A;  . CORONARY STENT INTERVENTION N/A 09/14/2019   Procedure: CORONARY STENT INTERVENTION;  Surgeon: Martinique, Peter M, MD;  Location: [place] [facility];  Service: Cardiovascular;  Laterality: N/A;  . CORONARY STENT INTERVENTION N/A 09/15/2019   Procedure: CORONARY STENT INTERVENTION;  Surgeon: Martinique, Peter M, MD;  Location: [place] [facility];  Service: Cardiovascular;  Laterality: N/A;  . ENDARTERECTOMY Left 03/26/2017   Procedure: ENDARTERECTOMY CAROTID LEFT;  Surgeon:  Rosetta Posner, MD;  Location: [facility] OR;  Service: Vascular;  Laterality: Left;  . femerao bypass    . LEFT HEART CATH AND CORONARY ANGIOGRAPHY N/A 02/03/2017   Procedure: LEFT HEART CATH AND CORONARY ANGIOGRAPHY;  Surgeon: Martinique, Peter M, MD;  Location: [place] [facility] LAB;  Service: Cardiovascular;  Laterality: N/A;  . LEFT HEART CATH AND CORONARY ANGIOGRAPHY N/A 09/14/2019   Procedure: LEFT HEART CATH AND CORONARY ANGIOGRAPHY;  Surgeon: Martinique, Peter M, MD;  Location: [place] [facility] LAB;  Service: Cardiovascular;  Laterality: N/A;  . PATCH ANGIOPLASTY Left 03/26/2017   Procedure: PATCH ANGIOPLASTY LEFT CAROTID ARTERY;  Surgeon: Rosetta Posner, MD;  Location: [place];  Service: Vascular;  Laterality: Left;  . SCALP LACERATION REPAIR N/A 05/23/2013   Procedure: EXCISION SCALP ULCER DEBRIDEMENT OF SKIN AND BONE WITH PLACEMENT OF A-CELL;  Surgeon: Irene Limbo, MD;  Location: [place];  Service: Plastics;  Laterality: N/A;  . STOMACH SURGERY     x2  . VASCULAR SURGERY     AFBG 10/15/05    Family History  Problem Relation Age of Onset  . Heart failure  Father    Social History:  reports that he quit smoking about 11 years ago. He has never used smokeless tobacco. He reports current alcohol use. He reports that he does not use drugs.  Allergies:  Allergies  Allergen Reactions  . Other Other (See Comments)    Any narcotic makes him a "wild man"  Delusions (intolerance)    Medications:                                                                                                                        I reviewed home medications   ROS:                                                                                                                                     14 systems reviewed and negative except above    Examination:  General: Appears well-developed  Psych: Affect appropriate to situation Eyes: No scleral injection HENT: No OP obstrucion Head: Normocephalic.  Cardiovascular: Normal rate and regular rhythm. Respiratory: Effort normal and breath sounds normal to anterior ascultation GI: Soft.  No distension. There is no tenderness.  Skin: WDI    Neurological Examination Mental Status: Alert, unable to state month or his age, expressive greater than receptive aphasia.  Unable to repeat or name objects correctly.  Able to simple commands intermittently. Cranial Nerves:  II: Visual fields grossly normal,  III,IV, VI: ptosis not present, extra-ocular motions intact bilaterally, pupils equal, round, reactive to light and accommodation V,VII: smile symmetric, facial light touch sensation normal bilaterally VIII: hearing normal bilaterally IX,X: uvula rises symmetrically XI: bilateral shoulder shrug XII: midline tongue extension Motor: Right : Upper extremity   4/5    Left:     Upper extremity   5/5  Lower extremity   5/5     Lower extremity   5/5 Tone and bulk:normal tone throughout; no atrophy noted Sensory: Pinprick  and light touch intact throughout, bilaterally Deep Tendon Reflexes: 2+ and symmetric throughout Plantars: Right: downgoing   Left: downgoing Cerebellar: normal finger-to-nose      Lab Results: Basic Metabolic Panel: Recent Labs  Lab 09/15/19 0321 09/15/19 1303 09/16/19 0431 09/21/19 1953  NA 136  --  137 138  137  K 3.6  --  3.7 3.6  3.6  CL 106  --  105 106  104  CO2 21*  --  23 19*  GLUCOSE 94  --  105* 154*  152*  BUN 17  --  11 15  16   CREATININE 1.35* 1.28* 1.17 1.63*  1.60*  CALCIUM 8.4*  --  8.6* 9.4    CBC: Recent Labs  Lab 09/15/19 0321 09/15/19 1303 09/16/19 0431 09/21/19 1953  WBC 8.3 8.7 7.0 8.0  NEUTROABS  --   --   --  5.3  HGB 12.5* 13.7 13.1 14.9  15.3  HCT 38.5* 42.4 39.9 44.8  45.0  MCV 101.3* 102.9* 101.5* 99.8  PLT 278 301 269 346    Coagulation Studies: Recent Labs    09/21/19 1953  LABPROT 14.6  INR 1.2    Imaging: CT HEAD CODE STROKE WO CONTRAST  Result Date: 09/21/2019 CLINICAL DATA:  Code stroke.  Right facial droop.  LKW 1830 EXAM: CT HEAD WITHOUT CONTRAST TECHNIQUE: Contiguous axial images were obtained from the base of the skull through the vertex without intravenous contrast. COMPARISON:  None. FINDINGS: Brain: There is no mass, hemorrhage or extra-axial collection. There is generalized atrophy without lobar predilection. There is hypoattenuation of the periventricular white matter, most commonly indicating chronic ischemic microangiopathy. Vascular: Atherosclerotic calcification of the vertebral and internal carotid arteries at the skull base. No abnormal hyperdensity of the major intracranial arteries or dural venous sinuses. Skull: The visualized skull base, calvarium and extracranial soft tissues are normal. Sinuses/Orbits: No fluid levels or advanced mucosal thickening of the visualized paranasal sinuses. No mastoid or middle ear effusion. The orbits are normal. ASPECTS [facility] Stroke Program Early CT Score) - Ganglionic  level infarction (caudate, lentiform nuclei, internal capsule, insula, M1-M3 cortex): 7 - Supraganglionic infarction (M4-M6 cortex): 3 Total score (0-10 with 10 being normal): 10 IMPRESSION: 1. No acute intracranial abnormality. 2. ASPECTS is 10. These results were communicated to Dr. Karena Addison Daeshawn Redmann at 8:03 pm on 09/21/2019 by text page via the [facility] messaging system. Electronically Signed   By: Cletus Gash.D.  On: 09/21/2019 20:03     ASSESSMENT AND PLAN   84 y.o. male with past medical history significant for prior stroke, carotid artery stenosis, coronary artery disease status post recent PCI on 09/15/19 with aphasia and right-sided weakness.  Stat CT head negative for hemorrhage and patient received IV TPA.  Patient developed hematoma subsequently over right radial artery puncture site, pressure applied and has remained stable in size.  Fibrinogen level within normal limits therefore TPA not reversed.  Acute ischemic stroke status post IV TPA Aphasia: Improved after TPA   Recommend # MRI of the brain without contrast #Transthoracic Echo  #Typically will hold antiplatelets for 24 hours post TPA, may consider getting Plavix tomorrow morning given recent stents.  Stroke team to discuss with cardiology. #Start or continue Atorvastatin 80 mg/other high intensity statin # BP goal: permissive HTN upto 180/105 mmHg # HBAIC and Lipid profile # Telemetry monitoring # Frequent neuro checks # stroke swallow screen  Coronary artery disease status post PCI -Hold antiplatelets due to TPA -Due to recent stent, may consider giving Plavix dose tomorrow  Hypertension -BP goal is stated as above -As needed labetalol ordered  Hyperlipidemia -Lipid profile -High-dose statin with goal LDL less than 70  Right radial artery hematoma -Stable after 20 minutes compression -Compression dressing in place   This patient is neurologically critically ill due to stroke status post IV TPA.   He is at risk  for significant risk of neurological worsening from cerebral edema,  death from brain herniation, heart failure, hemorrhagic conversion, infection, respiratory failure and seizure. This patient's care requires constant monitoring of vital signs, hemodynamics, respiratory and cardiac monitoring, review of multiple databases, neurological assessment, discussion with family, other specialists and medical decision making of high complexity.  I spent 55 minutes of neurocritical time in the care of this patient.   Please page stroke NP  Or  PA  Or MD from 8am -4 pm  as this patient from this time will be  followed by the stroke.   You can look them up on [email or web address]  Password [facility]    Ennio Houp [facility] Pager Number [phone]

## 2019-09-21 NOTE — ED Notes (Signed)
TPA complete, NS started

## 2019-09-21 NOTE — ED Provider Notes (Signed)
[name] EMERGENCY DEPARTMENT Provider Note   CSN: [id number] Arrival date & time: 09/21/19  1946     History Chief Complaint  Patient presents with  . Code Stroke    Earl Mueller is a 84 y.o. male w pmhx significant for CAD s/p DES placement 7/1, HTN, HLD, prior CVA who presents via EMS with concern for stroke. Last known normal 1800 when he was eating dinner with wife. Wife first noticed aphasia with trouble forming words following by slurring/R facial droop. Pt with R sided extremity weakness. EMS endorses improvement of symptoms during transport. Of note patient with recent hospitalization found to have occlusion vessel occlusion on LHC with placement on DES. Patient d/c on 7/03. Wife at bedside deny any changes, complaints prior to onset.   The history is provided by the patient. The history is limited by the condition of the patient.  Cerebrovascular Accident This is a new problem. The current episode started 1 to 2 hours ago. The problem occurs constantly. The problem has been gradually improving. Pertinent negatives include no chest pain, no abdominal pain, no headaches and no shortness of breath.       Past Medical History:  Diagnosis Date  . Anxiety   . Arthritis   . Cancer ([place])    skin  . Carotid artery stenosis 03/26/2017  . Coronary artery disease involving native coronary artery of native heart without angina pectoris 11/20/2014  . Dyslipidemia 11/20/2014  . Essential hypertension 11/20/2014   Dr Geraldo Pitter, [facility]  . Hypothyroidism   . Peripheral vascular disease ([place])   . Pre-operative clearance 01/18/2017  . Skin ulcer of scalp ([place]) 05/10/2013  . 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 Symptomatic carotid artery stenosis 03/05/2017    Patient Active Problem List   Diagnosis Date Noted  . Unstable angina ([place])   . Ischemic heart disease 09/13/2019  . Chest pain 09/13/2019  . Exertional dyspnea 09/13/2019  . Stroke  ([place])   . Carotid artery stenosis 03/26/2017  . Symptomatic carotid artery stenosis 03/05/2017  . Pre-operative clearance 01/18/2017  . Carotid bruit 11/25/2016  . Coronary artery disease involving native coronary artery of native heart without angina pectoris 11/20/2014  . Dyslipidemia 11/20/2014  . Essential hypertension 11/20/2014  . Peripheral vascular disease ([place]) 11/20/2014  . History of stroke 11/20/2014  . Skin ulcer of scalp ([place]) 05/10/2013    Past Surgical History:  Procedure Laterality Date  . BACK SURGERY    . CARDIAC CATHETERIZATION  03/06/10   NL-LM, 40%pLAD, 30% mid/distal LAD, 98% ostial DIAG (small), 30%pCx, 50%mCx, 30%dCx, 30% OM2, 40%dRCA; Med RX rec ([facility])  . caritid endart    . CORONARY ATHERECTOMY N/A 09/14/2019   Procedure: CORONARY ATHERECTOMY;  Surgeon: Martinique, Peter M, MD;  Location: [facility];  Service: Cardiovascular;  Laterality: N/A;  . CORONARY ATHERECTOMY N/A 09/15/2019   Procedure: CORONARY ATHERECTOMY;  Surgeon: Martinique, Peter M, MD;  Location: [facility];  Service: Cardiovascular;  Laterality: N/A;  . CORONARY STENT INTERVENTION N/A 09/14/2019   Procedure: CORONARY STENT INTERVENTION;  Surgeon: Martinique, Peter M, MD;  Location: [facility];  Service: Cardiovascular;  Laterality: N/A;  . CORONARY STENT INTERVENTION N/A 09/15/2019   Procedure: CORONARY STENT INTERVENTION;  Surgeon: Martinique, Peter M, MD;  Location: [facility];  Service: Cardiovascular;  Laterality: N/A;  . ENDARTERECTOMY Left 03/26/2017   Procedure: ENDARTERECTOMY CAROTID LEFT;  Surgeon: Rosetta Posner,  MD;  Location: [facility];  Service: Vascular;  Laterality: Left;  . femerao bypass    . LEFT HEART CATH AND CORONARY ANGIOGRAPHY N/A 02/03/2017   Procedure: LEFT HEART CATH AND CORONARY ANGIOGRAPHY;  Surgeon: Martinique, Peter M, MD;  Location: [facility];  Service: Cardiovascular;  Laterality: N/A;  . LEFT HEART CATH AND CORONARY ANGIOGRAPHY N/A 09/14/2019   Procedure:  LEFT HEART CATH AND CORONARY ANGIOGRAPHY;  Surgeon: Martinique, Peter M, MD;  Location: [facility];  Service: Cardiovascular;  Laterality: N/A;  . PATCH ANGIOPLASTY Left 03/26/2017   Procedure: PATCH ANGIOPLASTY LEFT CAROTID ARTERY;  Surgeon: Rosetta Posner, MD;  Location: [facility];  Service: Vascular;  Laterality: Left;  . SCALP LACERATION REPAIR N/A 05/23/2013   Procedure: EXCISION SCALP ULCER DEBRIDEMENT OF SKIN AND BONE WITH PLACEMENT OF A-CELL;  Surgeon: Irene Limbo, MD;  Location: [facility];  Service: Plastics;  Laterality: N/A;  . STOMACH SURGERY     x2  . VASCULAR SURGERY     AFBG 10/15/05       Family History  Problem Relation Age of Onset  . Heart failure Father     Social History   Tobacco Use  . Smoking status: Former Smoker    Quit date: 12/18/2007    Years since quitting: 11.7  . Smokeless tobacco: Never Used  Vaping Use  . Vaping Use: Never used  Substance Use Topics  . Alcohol use: Yes    Comment: occ  . Drug use: No    Home Medications Prior to Admission medications   Medication Sig Start Date End Date Taking? Authorizing Provider  Acetaminophen 500 MG coapsule Take 1,000 mg by mouth every 4 (four) hours as needed for fever or pain.  08/20/17   [provider]  aspirin EC 81 MG tablet Take 81 mg by mouth daily.    [provider]  atorvastatin (LIPITOR) 80 MG tablet TAKE 1 TABLET EVERY DAY 09/07/19   Marge Duncans, PA-C  cilostazol (PLETAL) 50 MG tablet Take 50 mg by mouth 2 (two) times daily.    [provider]  clopidogrel (PLAVIX) 75 MG tablet TAKE 1 TABLET EVERY DAY 05/02/19   Cox, Kirsten, MD  fenofibrate 160 MG tablet Take 160 mg by mouth daily.    [provider]  levothyroxine (SYNTHROID) 112 MCG tablet Take 1 tablet (112 mcg total) by mouth daily. 08/31/19   Cox, Elnita Maxwell, MD  losartan (COZAAR) 25 MG tablet Take 1 tablet (25 mg total) by mouth daily. 09/17/19   Georgette Shell, MD  metoprolol succinate (TOPROL-XL) 25 MG  24 hr tablet Take 1 tablet (25 mg total) by mouth daily. 09/17/19   Georgette Shell, MD  Multiple Vitamins-Minerals (CENTRUM SILVER PO) Take 1 tablet by mouth daily.    [provider]  nitroGLYCERIN (NITROSTAT) 0.4 MG SL tablet Place 0.4 mg under the tongue every 5 (five) minutes as needed for chest pain.    [provider]  PARoxetine (PAXIL) 20 MG tablet Take 20 mg by mouth daily.    [provider]    Allergies    Other  Review of Systems   Review of Systems  Constitutional: Negative for activity change, chills and fever.  HENT: Negative for congestion, rhinorrhea and sore throat.   Eyes: Negative for redness and visual disturbance.  Respiratory: Negative for cough and shortness of breath.   Cardiovascular: Negative for chest pain and leg swelling.  Gastrointestinal: Negative for abdominal pain, nausea  and vomiting.  Musculoskeletal: Positive for gait problem. Negative for back pain.  Skin: Negative for rash and wound.  Neurological: Positive for facial asymmetry, speech difficulty and weakness. Negative for dizziness, tremors, syncope, light-headedness, numbness and headaches.  Psychiatric/Behavioral: Positive for confusion.    Physical Exam Updated Vital Signs BP (!) 168/72   Pulse 93   Temp (!) 97.3 F (36.3 C)   Resp 18   Wt 66.8 kg   SpO2 98%   BMI 21.13 kg/m   Physical Exam Vitals and nursing note reviewed.  Constitutional:      General: He is not in acute distress.    Appearance: He is well-developed. He is not toxic-appearing or diaphoretic.  HENT:     Head: Normocephalic and atraumatic.     Nose: Nose normal.  Eyes:     Extraocular Movements: Extraocular movements intact.     Conjunctiva/sclera: Conjunctivae normal.     Pupils: Pupils are equal, round, and reactive to light.  Cardiovascular:     Rate and Rhythm: Normal rate and regular rhythm.     Heart sounds: No murmur heard.   Pulmonary:     Effort: Pulmonary effort is  normal. No respiratory distress.     Breath sounds: Normal breath sounds.  Musculoskeletal:     Cervical back: Neck supple.     Right lower leg: No edema.     Left lower leg: No edema.  Skin:    General: Skin is warm and dry.  Neurological:     Mental Status: He is alert.     GCS: GCS eye subscore is 4. GCS verbal subscore is 4. GCS motor subscore is 6.     Cranial Nerves: Cranial nerve deficit, dysarthria and facial asymmetry present.     Sensory: Sensation is intact.     Motor: Motor function is intact. No weakness.     Comments: R sided facial droop present, some trouble forming words      ED Results / Procedures / Treatments   Labs (all labs ordered are listed, but only abnormal results are displayed) Labs Reviewed  COMPREHENSIVE METABOLIC PANEL - Abnormal; Notable for the following components:      Result Value   CO2 19 (*)    Glucose, Bld 154 (*)    Creatinine, Ser 1.63 (*)    Albumin 3.2 (*)    GFR calc non Af Amer 39 (*)    GFR calc Af Amer 45 (*)    All other components within normal limits  I-STAT CHEM 8, ED - Abnormal; Notable for the following components:   Creatinine, Ser 1.60 (*)    Glucose, Bld 152 (*)    Calcium, Ion 1.05 (*)    TCO2 19 (*)    All other components within normal limits  CBG MONITORING, ED - Abnormal; Notable for the following components:   Glucose-Capillary 143 (*)    All other components within normal limits  SARS CORONAVIRUS 2 BY RT PCR (HOSPITAL ORDER, [facility])  ETHANOL  PROTIME-INR  APTT  CBC  DIFFERENTIAL  RAPID URINE DRUG SCREEN, HOSP PERFORMED  URINALYSIS, ROUTINE W REFLEX MICROSCOPIC    EKG None  Radiology CT ANGIO NECK W OR WO CONTRAST  Result Date: 09/21/2019 CLINICAL DATA:  Right-sided weakness and facial droop EXAM: CT ANGIOGRAPHY HEAD AND NECK TECHNIQUE: Multidetector CT imaging of the head and neck was performed using the standard protocol during bolus administration of intravenous  contrast. Multiplanar CT image reconstructions and  MIPs were obtained to evaluate the vascular anatomy. Carotid stenosis measurements (when applicable) are obtained utilizing NASCET criteria, using the distal internal carotid diameter as the denominator. CONTRAST:  28mL OMNIPAQUE IOHEXOL 350 MG/ML SOLN COMPARISON:  None. FINDINGS: CTA NECK FINDINGS SKELETON: There is no bony spinal canal stenosis. No lytic or blastic lesion. OTHER NECK: Normal pharynx, larynx and major salivary glands. No cervical lymphadenopathy. Unremarkable thyroid gland. UPPER CHEST: Small pleural effusions and diffuse interstitial prominence. AORTIC ARCH: There is mild calcific atherosclerosis of the aortic arch. There is no aneurysm, dissection or hemodynamically significant stenosis of the visualized portion of the aorta. Conventional 3 vessel aortic branching pattern. The visualized proximal subclavian arteries are widely patent. RIGHT CAROTID SYSTEM: No dissection, occlusion or aneurysm. There is low density atherosclerosis extending into the proximal ICA, resulting in 60% stenosis. This has slightly worsened since the prior study. LEFT CAROTID SYSTEM: There is moderate narrowing of the proximal left common carotid artery. No hemodynamically significant stenosis of the left internal carotid artery. VERTEBRAL ARTERIES: Codominant configuration. There is at least moderate narrowing of the left vertebral artery origin. There is multifocal atherosclerosis along both V2 segments. CTA HEAD FINDINGS POSTERIOR CIRCULATION: --Vertebral arteries: Atherosclerotic calcification of V4 segments without high-grade stenosis. --Inferior cerebellar arteries: Normal. --Basilar artery: Normal. --Superior cerebellar arteries: Normal. --Posterior cerebral arteries (PCA): Normal. ANTERIOR CIRCULATION: --Intracranial internal carotid arteries: Atherosclerotic calcification of the internal carotid arteries at the skull base with moderate right and severe left  proximal cavernous segment stenosis. --Anterior cerebral arteries (ACA): Normal. Both A1 segments are present. Patent anterior communicating artery (a-comm). --Middle cerebral arteries (MCA): Normal. VENOUS SINUSES: As permitted by contrast timing, patent. ANATOMIC VARIANTS: None Review of the MIP images confirms the above findings. IMPRESSION: 1. No emergent large vessel occlusion. 2. Moderate right and severe left proximal cavernous segment stenosis of the internal carotid arteries secondary to atherosclerotic calcification. 3. Slightly worsened atherosclerotic stenosis of the proximal right internal carotid artery, measuring 60% by NASCET criteria. 4. Improved patency of left internal carotid artery. 5. At least moderate narrowing of the left vertebral artery origin. 6. Small pleural effusions and diffuse interstitial prominence, compatible with interstitial lung disease. 7. Aortic Atherosclerosis (ICD10-I70.0). Electronically Signed   By: Ulyses Jarred M.D.   On: 09/21/2019 20:42   CT HEAD CODE STROKE WO CONTRAST  Result Date: 09/21/2019 CLINICAL DATA:  Code stroke.  Right facial droop.  LKW 1830 EXAM: CT HEAD WITHOUT CONTRAST TECHNIQUE: Contiguous axial images were obtained from the base of the skull through the vertex without intravenous contrast. COMPARISON:  None. FINDINGS: Brain: There is no mass, hemorrhage or extra-axial collection. There is generalized atrophy without lobar predilection. There is hypoattenuation of the periventricular white matter, most commonly indicating chronic ischemic microangiopathy. Vascular: Atherosclerotic calcification of the vertebral and internal carotid arteries at the skull base. No abnormal hyperdensity of the major intracranial arteries or dural venous sinuses. Skull: The visualized skull base, calvarium and extracranial soft tissues are normal. Sinuses/Orbits: No fluid levels or advanced mucosal thickening of the visualized paranasal sinuses. No mastoid or middle ear  effusion. The orbits are normal. ASPECTS [facility] Stroke Program Early CT Score) - Ganglionic level infarction (caudate, lentiform nuclei, internal capsule, insula, M1-M3 cortex): 7 - Supraganglionic infarction (M4-M6 cortex): 3 Total score (0-10 with 10 being normal): 10 IMPRESSION: 1. No acute intracranial abnormality. 2. ASPECTS is 10. These results were communicated to Dr. Karena Addison Aroor at 8:03 pm on 09/21/2019 by text page via the [facility] messaging system. Electronically Signed  By: Ulyses Jarred M.D.   On: 09/21/2019 20:03   CT ANGIO HEAD CODE STROKE  Result Date: 09/21/2019 CLINICAL DATA:  Right-sided weakness and facial droop EXAM: CT ANGIOGRAPHY HEAD AND NECK TECHNIQUE: Multidetector CT imaging of the head and neck was performed using the standard protocol during bolus administration of intravenous contrast. Multiplanar CT image reconstructions and MIPs were obtained to evaluate the vascular anatomy. Carotid stenosis measurements (when applicable) are obtained utilizing NASCET criteria, using the distal internal carotid diameter as the denominator. CONTRAST:  78mL OMNIPAQUE IOHEXOL 350 MG/ML SOLN COMPARISON:  None. FINDINGS: CTA NECK FINDINGS SKELETON: There is no bony spinal canal stenosis. No lytic or blastic lesion. OTHER NECK: Normal pharynx, larynx and major salivary glands. No cervical lymphadenopathy. Unremarkable thyroid gland. UPPER CHEST: Small pleural effusions and diffuse interstitial prominence. AORTIC ARCH: There is mild calcific atherosclerosis of the aortic arch. There is no aneurysm, dissection or hemodynamically significant stenosis of the visualized portion of the aorta. Conventional 3 vessel aortic branching pattern. The visualized proximal subclavian arteries are widely patent. RIGHT CAROTID SYSTEM: No dissection, occlusion or aneurysm. There is low density atherosclerosis extending into the proximal ICA, resulting in 60% stenosis. This has slightly worsened since the prior study. LEFT  CAROTID SYSTEM: There is moderate narrowing of the proximal left common carotid artery. No hemodynamically significant stenosis of the left internal carotid artery. VERTEBRAL ARTERIES: Codominant configuration. There is at least moderate narrowing of the left vertebral artery origin. There is multifocal atherosclerosis along both V2 segments. CTA HEAD FINDINGS POSTERIOR CIRCULATION: --Vertebral arteries: Atherosclerotic calcification of V4 segments without high-grade stenosis. --Inferior cerebellar arteries: Normal. --Basilar artery: Normal. --Superior cerebellar arteries: Normal. --Posterior cerebral arteries (PCA): Normal. ANTERIOR CIRCULATION: --Intracranial internal carotid arteries: Atherosclerotic calcification of the internal carotid arteries at the skull base with moderate right and severe left proximal cavernous segment stenosis. --Anterior cerebral arteries (ACA): Normal. Both A1 segments are present. Patent anterior communicating artery (a-comm). --Middle cerebral arteries (MCA): Normal. VENOUS SINUSES: As permitted by contrast timing, patent. ANATOMIC VARIANTS: None Review of the MIP images confirms the above findings. IMPRESSION: 1. No emergent large vessel occlusion. 2. Moderate right and severe left proximal cavernous segment stenosis of the internal carotid arteries secondary to atherosclerotic calcification. 3. Slightly worsened atherosclerotic stenosis of the proximal right internal carotid artery, measuring 60% by NASCET criteria. 4. Improved patency of left internal carotid artery. 5. At least moderate narrowing of the left vertebral artery origin. 6. Small pleural effusions and diffuse interstitial prominence, compatible with interstitial lung disease. 7. Aortic Atherosclerosis (ICD10-I70.0). Electronically Signed   By: Ulyses Jarred M.D.   On: 09/21/2019 20:42    Procedures Procedures (including critical care time)  Medications Ordered in ED Medications   stroke: mapping our early  stages of recovery book (has no administration in time range)  0.9 %  sodium chloride infusion (has no administration in time range)  acetaminophen (TYLENOL) tablet 650 mg (has no administration in time range)    Or  acetaminophen (TYLENOL) 160 MG/5ML solution 650 mg (has no administration in time range)    Or  acetaminophen (TYLENOL) suppository 650 mg (has no administration in time range)  senna-docusate (Senokot-S) tablet 1 tablet (has no administration in time range)  pantoprazole (PROTONIX) injection 40 mg (has no administration in time range)  labetalol (NORMODYNE) injection 10 mg (has no administration in time range)  alteplase (ACTIVASE) 1 mg/mL infusion 60.1 mg (0 mg/kg  66.8 kg Intravenous Stopped 09/21/19 2008)    Followed by  0.9 %  sodium chloride infusion (50 mLs Intravenous New Bag/Given 09/21/19 2057)  iohexol (OMNIPAQUE) 350 MG/ML injection 75 mL (75 mLs Intravenous Contrast Given 09/21/19 2005)    ED Course  I have reviewed the triage vital signs and the nursing notes.  Pertinent labs & imaging results that were available during my care of the patient were reviewed by me and considered in my medical decision making (see chart for details).    MDM Rules/Calculators/A&P                          Medical Decision Making:  Earl Mueller is a 84 y.o. male who presented to the ED today with Aphasia, Slurring speech, R sided deficits concerning for a stroke. CODE STROKE was initiated on this patient.  Past medical history includes HTN, HLD, CAD s/p LHC and DES placement on 7/1   The patient was quickly assessed by myself and the attending. The stroke team was immediately available. A through neurological exam was performed, and pertinent findings include aphasia with trouble forming words, slurring, R sided facial droop. R sided extremities appeared to have intact strength/sensation. However R UE/LE were noted by EMS to be weak on their arrival. Labs performed and resulted above.  Pertinent lab findings include: BG 154. CMP wo significant abnormalities from baseline (Cr 1.63 appears to be baseline). No leukocytosis, no anemia.   CT scanner was made available and the patient was rapidly transported. Head CT full report above, but in summary revealed no acute intracranial abnormalities. Specifically no evidence of mass, hemorrhage or large area of ischemia.  CTA wo large vessel occlusion. Moderate right and severe left proximal cavernous segment stenosis of the internal carotid arteries and slightly worsened atherosclerotic stenosis of the proximal right internal carotid artery, measuring 60% by NASCET criteria.  Based on history and exam on presentation concern for small ischemic stroke in setting of recent LHC, DES placement (7/1). Patient was a candidate for thrombolytics and tPA was administered. SBPs 160s. On reassessment following tPA administration, patient w improved symptoms. Patient now able to recognized multiple objects, state full name and repeat sentence (he was previously unable to complete these tasks). Based on the above findings, I believe patient requires admission for further evaluation and care. Pt will be admitted to Neuro ICU in setting of tPA administration.    All radiology and laboratory studies reviewed independently and with my attending physician, agree with reading provided by radiologist unless otherwise noted.    Final Clinical Impression(s) / ED Diagnoses Final diagnoses:  Acute ischemic stroke ([place])  Slurred speech  Aphasia    Rx / DC Orders ED Discharge Orders    None       Kennyth Lose, MD 09/21/19 2138    Carmin Muskrat, MD 09/22/19 1350

## 2019-09-21 NOTE — ED Notes (Signed)
Pt able to recognize watch and pen, speech now slurred vs garbled

## 2019-09-21 NOTE — Progress Notes (Signed)
Pharmacist Code Stroke Response  Notified to mix tPA at 1958 by Dr. Lorraine Lax Delivered tPA to RN at 2002 tPA administered at 2007  tPA dose = 6mg  bolus over 1 minute followed by 54.1mg  for a total dose of 60.1mg  over 1 hour  Issues/delays encountered (if applicable): None  Albertina Parr, PharmD., BCPS, BCCCP Clinical Pharmacist Clinical phone for 09/21/19 until 11:30pm: [phone] If after 11:30pm, please refer to [facility] for unit-specific pharmacist

## 2019-09-21 NOTE — ED Triage Notes (Addendum)
Pt arrived via [place] from home, EMS report LKW 1800, pts wife states after eating dinner pt walked to recliner and developed slurred speech with R sided weakness and R sided facial droop. Pt alert, no trauma noted. Pt d/c 09/15/19 after cardiac stents x 2 placed. Pt is on Plavix.IV x 2 established, BP 142/80, 96% on 3L, HR 100. CBG 271

## 2019-09-21 NOTE — ED Notes (Signed)
Pt's wife at bedside, pt did have pistol in holster on pants, weapon removed and given to pts wife to secured weapon in vehicle.

## 2019-09-22 ENCOUNTER — Inpatient Hospital Stay (HOSPITAL_COMMUNITY): Payer: Medicare HMO

## 2019-09-22 ENCOUNTER — Encounter (HOSPITAL_COMMUNITY): Payer: Medicare HMO

## 2019-09-22 DIAGNOSIS — I6389 Other cerebral infarction: Secondary | ICD-10-CM

## 2019-09-22 LAB — ECHOCARDIOGRAM COMPLETE: Weight: 2356.28 oz

## 2019-09-22 LAB — MRSA PCR SCREENING: MRSA by PCR: NEGATIVE

## 2019-09-22 MED ORDER — PAROXETINE HCL 20 MG PO TABS
20.0000 mg | ORAL_TABLET | Freq: Every day | ORAL | Status: DC
  Administered 2019-09-22 – 2019-09-25 (×4): 20 mg via ORAL
  Filled 2019-09-22 (×4): qty 1

## 2019-09-22 MED ORDER — CHLORHEXIDINE GLUCONATE CLOTH 2 % EX PADS
6.0000 | MEDICATED_PAD | Freq: Every day | CUTANEOUS | Status: DC
  Administered 2019-09-22 – 2019-09-23 (×2): 6 via TOPICAL

## 2019-09-22 MED ORDER — LEVOTHYROXINE SODIUM 112 MCG PO TABS
112.0000 ug | ORAL_TABLET | Freq: Every day | ORAL | Status: DC
  Administered 2019-09-22 – 2019-09-25 (×4): 112 ug via ORAL
  Filled 2019-09-22 (×4): qty 1

## 2019-09-22 MED ORDER — FENOFIBRATE 160 MG PO TABS
160.0000 mg | ORAL_TABLET | Freq: Every day | ORAL | Status: DC
  Administered 2019-09-22 – 2019-09-25 (×4): 160 mg via ORAL
  Filled 2019-09-22 (×4): qty 1

## 2019-09-22 MED ORDER — LOSARTAN POTASSIUM 50 MG PO TABS: 25.0000 mg | ORAL_TABLET | Freq: Every day | ORAL | Status: DC

## 2019-09-22 MED ORDER — PANTOPRAZOLE SODIUM 40 MG PO TBEC
40.0000 mg | DELAYED_RELEASE_TABLET | Freq: Every day | ORAL | Status: DC
  Administered 2019-09-22 – 2019-09-24 (×3): 40 mg via ORAL
  Filled 2019-09-22 (×3): qty 1

## 2019-09-22 MED ORDER — METOPROLOL SUCCINATE ER 25 MG PO TB24
25.0000 mg | ORAL_TABLET | Freq: Every day | ORAL | Status: DC
  Administered 2019-09-22 – 2019-09-25 (×2): 25 mg via ORAL
  Filled 2019-09-22 (×4): qty 1

## 2019-09-22 MED ORDER — ASPIRIN EC 81 MG PO TBEC
81.0000 mg | DELAYED_RELEASE_TABLET | Freq: Every day | ORAL | Status: DC
  Administered 2019-09-23 – 2019-09-25 (×3): 81 mg via ORAL
  Filled 2019-09-22 (×3): qty 1

## 2019-09-22 MED ORDER — CLOPIDOGREL BISULFATE 75 MG PO TABS
75.0000 mg | ORAL_TABLET | Freq: Every day | ORAL | Status: DC
  Administered 2019-09-23 – 2019-09-25 (×3): 75 mg via ORAL
  Filled 2019-09-22 (×3): qty 1

## 2019-09-22 MED ORDER — ATORVASTATIN CALCIUM 80 MG PO TABS
80.0000 mg | ORAL_TABLET | Freq: Every day | ORAL | Status: DC
  Administered 2019-09-22 – 2019-09-24 (×3): 80 mg via ORAL
  Filled 2019-09-22 (×3): qty 1

## 2019-09-22 NOTE — Progress Notes (Signed)
STROKE TEAM PROGRESS NOTE   INTERVAL HISTORY His wife and dtr are at the bedside.  I have personally reviewed history of presenting illness with the patient, electronic medical records as well as imaging films in PACS.  Presented with aphasia and right hemiparesis and received IV TPA and has made substantial improvement in his almost back to normal with only slight speech hesitancy today.  Blood pressure has been adequately controlled.  Vital signs stable.  No complaints.  He had bleeding from right radial artery catheterization site which has stopped with pressure bandage.  Vitals:   09/22/19 0630 09/22/19 0700 09/22/19 0730 09/22/19 0800  BP: (!) 145/76 (!) 155/72 (!) 150/78 (!) 151/75  Pulse: 76 68 68 68  Resp: (!) 22 16 13  (!) 23  Temp:    97.8 F (36.6 C)  TempSrc:    Oral  SpO2: 93% 95% 97% 94%  Weight:       CBC:  Recent Labs  Lab 09/16/19 0431 09/21/19 1953  WBC 7.0 8.0  NEUTROABS  --  5.3  HGB 13.1 14.9  15.3  HCT 39.9 44.8  45.0  MCV 101.5* 99.8  PLT 269 417   Basic Metabolic Panel:  Recent Labs  Lab 09/16/19 0431 09/21/19 1953  NA 137 138  137  K 3.7 3.6  3.6  CL 105 106  104  CO2 23 19*  GLUCOSE 105* 154*  152*  BUN 11 15  16   CREATININE 1.17 1.63*  1.60*  CALCIUM 8.6* 9.4   Lipid Panel: No results for input(s): CHOL, TRIG, HDL, CHOLHDL, VLDL, LDLCALC in the last 168 hours. HgbA1c: No results for input(s): HGBA1C in the last 168 hours. Urine Drug Screen:  Recent Labs  Lab 09/21/19 2120  LABOPIA NONE DETECTED  COCAINSCRNUR NONE DETECTED  LABBENZ NONE DETECTED  AMPHETMU NONE DETECTED  THCU NONE DETECTED  LABBARB NONE DETECTED    Alcohol Level  Recent Labs  Lab 09/21/19 1953  ETH <10    IMAGING past 24 hours CT ANGIO NECK W OR WO CONTRAST  Result Date: 09/21/2019 CLINICAL DATA:  Right-sided weakness and facial droop EXAM: CT ANGIOGRAPHY HEAD AND NECK TECHNIQUE: Multidetector CT imaging of the head and neck was performed using the  standard protocol during bolus administration of intravenous contrast. Multiplanar CT image reconstructions and MIPs were obtained to evaluate the vascular anatomy. Carotid stenosis measurements (when applicable) are obtained utilizing NASCET criteria, using the distal internal carotid diameter as the denominator. CONTRAST:  67mL OMNIPAQUE IOHEXOL 350 MG/ML SOLN COMPARISON:  None. FINDINGS: CTA NECK FINDINGS SKELETON: There is no bony spinal canal stenosis. No lytic or blastic lesion. OTHER NECK: Normal pharynx, larynx and major salivary glands. No cervical lymphadenopathy. Unremarkable thyroid gland. UPPER CHEST: Small pleural effusions and diffuse interstitial prominence. AORTIC ARCH: There is mild calcific atherosclerosis of the aortic arch. There is no aneurysm, dissection or hemodynamically significant stenosis of the visualized portion of the aorta. Conventional 3 vessel aortic branching pattern. The visualized proximal subclavian arteries are widely patent. RIGHT CAROTID SYSTEM: No dissection, occlusion or aneurysm. There is low density atherosclerosis extending into the proximal ICA, resulting in 60% stenosis. This has slightly worsened since the prior study. LEFT CAROTID SYSTEM: There is moderate narrowing of the proximal left common carotid artery. No hemodynamically significant stenosis of the left internal carotid artery. VERTEBRAL ARTERIES: Codominant configuration. There is at least moderate narrowing of the left vertebral artery origin. There is multifocal atherosclerosis along both V2 segments. CTA HEAD FINDINGS POSTERIOR CIRCULATION: --  Vertebral arteries: Atherosclerotic calcification of V4 segments without high-grade stenosis. --Inferior cerebellar arteries: Normal. --Basilar artery: Normal. --Superior cerebellar arteries: Normal. --Posterior cerebral arteries (PCA): Normal. ANTERIOR CIRCULATION: --Intracranial internal carotid arteries: Atherosclerotic calcification of the internal carotid  arteries at the skull base with moderate right and severe left proximal cavernous segment stenosis. --Anterior cerebral arteries (ACA): Normal. Both A1 segments are present. Patent anterior communicating artery (a-comm). --Middle cerebral arteries (MCA): Normal. VENOUS SINUSES: As permitted by contrast timing, patent. ANATOMIC VARIANTS: None Review of the MIP images confirms the above findings. IMPRESSION: 1. No emergent large vessel occlusion. 2. Moderate right and severe left proximal cavernous segment stenosis of the internal carotid arteries secondary to atherosclerotic calcification. 3. Slightly worsened atherosclerotic stenosis of the proximal right internal carotid artery, measuring 60% by NASCET criteria. 4. Improved patency of left internal carotid artery. 5. At least moderate narrowing of the left vertebral artery origin. 6. Small pleural effusions and diffuse interstitial prominence, compatible with interstitial lung disease. 7. Aortic Atherosclerosis (ICD10-I70.0). Electronically Signed   By: Ulyses Jarred M.D.   On: 09/21/2019 20:42   CT HEAD CODE STROKE WO CONTRAST  Result Date: 09/21/2019 CLINICAL DATA:  Code stroke.  Right facial droop.  LKW 1830 EXAM: CT HEAD WITHOUT CONTRAST TECHNIQUE: Contiguous axial images were obtained from the base of the skull through the vertex without intravenous contrast. COMPARISON:  None. FINDINGS: Brain: There is no mass, hemorrhage or extra-axial collection. There is generalized atrophy without lobar predilection. There is hypoattenuation of the periventricular white matter, most commonly indicating chronic ischemic microangiopathy. Vascular: Atherosclerotic calcification of the vertebral and internal carotid arteries at the skull base. No abnormal hyperdensity of the major intracranial arteries or dural venous sinuses. Skull: The visualized skull base, calvarium and extracranial soft tissues are normal. Sinuses/Orbits: No fluid levels or advanced mucosal thickening  of the visualized paranasal sinuses. No mastoid or middle ear effusion. The orbits are normal. ASPECTS [facility] Stroke Program Early CT Score) - Ganglionic level infarction (caudate, lentiform nuclei, internal capsule, insula, M1-M3 cortex): 7 - Supraganglionic infarction (M4-M6 cortex): 3 Total score (0-10 with 10 being normal): 10 IMPRESSION: 1. No acute intracranial abnormality. 2. ASPECTS is 10. These results were communicated to Dr. Karena Addison Aroor at 8:03 pm on 09/21/2019 by text page via the [facility] messaging system. Electronically Signed   By: Ulyses Jarred M.D.   On: 09/21/2019 20:03   CT ANGIO HEAD CODE STROKE  Result Date: 09/21/2019 CLINICAL DATA:  Right-sided weakness and facial droop EXAM: CT ANGIOGRAPHY HEAD AND NECK TECHNIQUE: Multidetector CT imaging of the head and neck was performed using the standard protocol during bolus administration of intravenous contrast. Multiplanar CT image reconstructions and MIPs were obtained to evaluate the vascular anatomy. Carotid stenosis measurements (when applicable) are obtained utilizing NASCET criteria, using the distal internal carotid diameter as the denominator. CONTRAST:  64mL OMNIPAQUE IOHEXOL 350 MG/ML SOLN COMPARISON:  None. FINDINGS: CTA NECK FINDINGS SKELETON: There is no bony spinal canal stenosis. No lytic or blastic lesion. OTHER NECK: Normal pharynx, larynx and major salivary glands. No cervical lymphadenopathy. Unremarkable thyroid gland. UPPER CHEST: Small pleural effusions and diffuse interstitial prominence. AORTIC ARCH: There is mild calcific atherosclerosis of the aortic arch. There is no aneurysm, dissection or hemodynamically significant stenosis of the visualized portion of the aorta. Conventional 3 vessel aortic branching pattern. The visualized proximal subclavian arteries are widely patent. RIGHT CAROTID SYSTEM: No dissection, occlusion or aneurysm. There is low density atherosclerosis extending into the proximal ICA, resulting in  60%  stenosis. This has slightly worsened since the prior study. LEFT CAROTID SYSTEM: There is moderate narrowing of the proximal left common carotid artery. No hemodynamically significant stenosis of the left internal carotid artery. VERTEBRAL ARTERIES: Codominant configuration. There is at least moderate narrowing of the left vertebral artery origin. There is multifocal atherosclerosis along both V2 segments. CTA HEAD FINDINGS POSTERIOR CIRCULATION: --Vertebral arteries: Atherosclerotic calcification of V4 segments without high-grade stenosis. --Inferior cerebellar arteries: Normal. --Basilar artery: Normal. --Superior cerebellar arteries: Normal. --Posterior cerebral arteries (PCA): Normal. ANTERIOR CIRCULATION: --Intracranial internal carotid arteries: Atherosclerotic calcification of the internal carotid arteries at the skull base with moderate right and severe left proximal cavernous segment stenosis. --Anterior cerebral arteries (ACA): Normal. Both A1 segments are present. Patent anterior communicating artery (a-comm). --Middle cerebral arteries (MCA): Normal. VENOUS SINUSES: As permitted by contrast timing, patent. ANATOMIC VARIANTS: None Review of the MIP images confirms the above findings. IMPRESSION: 1. No emergent large vessel occlusion. 2. Moderate right and severe left proximal cavernous segment stenosis of the internal carotid arteries secondary to atherosclerotic calcification. 3. Slightly worsened atherosclerotic stenosis of the proximal right internal carotid artery, measuring 60% by NASCET criteria. 4. Improved patency of left internal carotid artery. 5. At least moderate narrowing of the left vertebral artery origin. 6. Small pleural effusions and diffuse interstitial prominence, compatible with interstitial lung disease. 7. Aortic Atherosclerosis (ICD10-I70.0). Electronically Signed   By: Ulyses Jarred M.D.   On: 09/21/2019 20:42    PHYSICAL EXAM Pleasant elderly male not in distress. . Afebrile.  Head is nontraumatic. Neck is supple without bruit.    Cardiac exam no murmur or gallop. Lungs are clear to auscultation. Distal pulses are well felt.  He has a bandage over her right radial wrist post TPA bleeding.  There are also bruises in the right antecubital and left forearm. Neurological Exam ;  Awake  Alert oriented x 3.  Slightly hesitant nonfluent speech with occasional word finding difficulties but good comprehension, naming and repetition.   [place] movements full without nystagmus.fundi were not visualized. Vision acuity and fields appear normal. Hearing is normal. Palatal movements are normal. Face symmetric. Tongue midline. Normal strength, tone, reflexes and coordination. Normal sensation. Gait deferred.  ASSESSMENT/PLAN [name] is a 84 y.o. male with history of prior stroke, carotid artery stenosis, coronary artery disease status post recent PCI on 09/15/19 presenting with sudden onset expressive aphasia, R facial droop and R hemiparesis. Received IV tPA 09/21/2019 at 2007.   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 ASPECTS 10.     CTA head & neck no LVO. Moderate R ICA w/ Proximal R ICA 60%, severe L proximal ICA, moderate narrowing L VA origin. Small pleural effusion. Aortic atherosclerosis.   MRI  pending   2D Echo 07/2019 EF 39%. Severe LVH. Repeat 2D today pending    Check LE dopplers, pt sedentary since PCI/stent pending   Protection electrophysiologist will consult and consider placement of an implantable loop recorder to evaluate for atrial fibrillation as etiology of stroke. This has been explained to patient/family by Dr. Leonie Man and they are agreeable. I contacted them today. They should see next week based on d/c plan.  LDL pending   HgbA1c pending   VTE prophylaxis - SCDs   aspirin 81 mg daily, clopidogrel 75  mg daily and pletal  prior to admission, now on No antithrombotic as wtihin 24h of tPA administration. Plan resume DAPT + pletal if 24h imaging neg for hemorrhage. (imaging planned for 1730)    Therapy recommendations:  Pending, ok to be OOB  Disposition:  Pending  R radial hematoma s/p tPA  Site of recent PCA 09/14/2019  20 mins compression w/ dsg placed  Site sore but stable this am   Hypertension  Home meds:  Losartan 25, metoprolol XL 25  Stable . BP goal per IR x 24h following IR procedure  . Hold losartan given elevated Cre . Long-term BP goal normotensive  Hyperlipidemia  Home meds:  lipitor 80 + fenofibrate  Resume lipitor + fenofibrate   LDL pending, goal < 70  Continue statin at discharge  Other Stroke Risk Factors  Advanced age  Former Cigarette smoker, quit 11 yrs ago  ETOH use, alcohol level <10, advised to drink no more than 2 drink(s) a day  Hx stroke/TIA   sequela - blind R eye - further details not available  Coronary artery disease s/p PCI, stent 09/15/2019. D/c'd home on home O2.    Other Active Problems  Hypothyroid on synthroid  Depression on paxil  AKI in setting of CKD, Cre 1.60-1.63 (lower during 09/14/2019 admission). On IVF @50h   Hospital day # 1 He presented with aphasia and right hemiparesis likely due to left MCA infarct and was treated with IV TPA and has made excellent clinical improvement.  Continue close neurological monitoring and and strict blood pressure control as per post TPA protocol.  Mobilize out of bed.  Therapy consults.  MRI scan of the brain later this evening.  Start aspirin if no bleed.  Long discussion with patient and wife and answered questions. This patient is critically ill and at significant risk of neurological worsening, death and care requires constant monitoring of vital signs, hemodynamics,respiratory and cardiac monitoring, extensive review of multiple databases, frequent neurological assessment, discussion  with family, other specialists and medical decision making of high complexity.I have made any additions or clarifications directly to the above note.This critical care time does not reflect procedure time, or teaching time or supervisory time of PA/NP/Med Resident etc but could involve care discussion time.  I spent 30 minutes of neurocritical care time  in the care of  this patient.    Antony Contras, MD  To contact Stroke Continuity provider, please refer to [email or web address] After hours, contact General Neurology

## 2019-09-22 NOTE — Progress Notes (Signed)
OT Cancellation Note  Patient Details Name: Earl Mueller MRN: [id number] DOB: [birth year]   Cancelled Treatment:    Reason Eval/Treat Not Completed: Patient not medically ready.  Awaiting LE dopplers to rule out DVT.  Will try back.  Nilsa Nutting., OTR/L Acute Rehabilitation Services Pager [phone] Office [phone]   Lucille Passy M 09/22/2019, 12:31 PM

## 2019-09-22 NOTE — Progress Notes (Signed)
PT Cancellation Note  Patient Details Name: Earl Mueller MRN: [id number] DOB: 06-16-1935   Cancelled Treatment:    Reason Eval/Treat Not Completed: Medical issues which prohibited therapy. Awaiting LE dopplers to rule out DVT.  Will follow up as time allows.   Zenaida Niece 09/22/2019, 3:23 PM

## 2019-09-22 NOTE — Evaluation (Signed)
Speech Language Pathology Evaluation Patient Details Name: Earl Mueller MRN: [id number] DOB: 27-Jun-1935 Today's Date: 09/22/2019 Time: 0737-1062 SLP Time Calculation (min) (ACUTE ONLY): 20 min  Problem List:  Patient Active Problem List   Diagnosis Date Noted  . Unstable angina ([place])   . Ischemic heart disease 09/13/2019  . Chest pain 09/13/2019  . Exertional dyspnea 09/13/2019  . Stroke ([place])   . Carotid artery stenosis 03/26/2017  . Symptomatic carotid artery stenosis 03/05/2017  . Pre-operative clearance 01/18/2017  . Carotid bruit 11/25/2016  . Coronary artery disease involving native coronary artery of native heart without angina pectoris 11/20/2014  . Dyslipidemia 11/20/2014  . Essential hypertension 11/20/2014  . Peripheral vascular disease ([place]) 11/20/2014  . History of stroke 11/20/2014  . Skin ulcer of scalp ([place]) 05/10/2013   Past Medical History:  Past Medical History:  Diagnosis Date  . Anxiety   . Arthritis   . Cancer ([place])    skin  . Carotid artery stenosis 03/26/2017  . Coronary artery disease involving native coronary artery of native heart without angina pectoris 11/20/2014  . Dyslipidemia 11/20/2014  . Essential hypertension 11/20/2014   Dr Geraldo Pitter, [place]  . Hypothyroidism   . Peripheral vascular disease ([place])   . Pre-operative clearance 01/18/2017  . Skin ulcer of scalp ([place]) 05/10/2013  . Stroke ([place]) 11/2014   rt eye blind-stroke was in his eye  . Symptomatic carotid artery stenosis 03/05/2017   Past Surgical History:  Past Surgical History:  Procedure Laterality Date  . BACK SURGERY    . CARDIAC CATHETERIZATION  03/06/10   NL-LM, 40%pLAD, 30% mid/distal LAD, 98% ostial DIAG (small), 30%pCx, 50%mCx, 30%dCx, 30% OM2, 40%dRCA; Med RX rec ([facility])  . caritid endart    . CORONARY ATHERECTOMY N/A 09/14/2019   Procedure: CORONARY ATHERECTOMY;  Surgeon: Martinique, Peter M, MD;  Location: [facility];  Service: Cardiovascular;  Laterality: N/A;  .  CORONARY ATHERECTOMY N/A 09/15/2019   Procedure: CORONARY ATHERECTOMY;  Surgeon: Martinique, Peter M, MD;  Location: [place] [facility];  Service: Cardiovascular;  Laterality: N/A;  . CORONARY STENT INTERVENTION N/A 09/14/2019   Procedure: CORONARY STENT INTERVENTION;  Surgeon: Martinique, Peter M, MD;  Location: [place] [facility];  Service: Cardiovascular;  Laterality: N/A;  . CORONARY STENT INTERVENTION N/A 09/15/2019   Procedure: CORONARY STENT INTERVENTION;  Surgeon: Martinique, Peter M, MD;  Location: [place] [facility];  Service: Cardiovascular;  Laterality: N/A;  . ENDARTERECTOMY Left 03/26/2017   Procedure: ENDARTERECTOMY CAROTID LEFT;  Surgeon: Rosetta Posner, MD;  Location: [facility] OR;  Service: Vascular;  Laterality: Left;  . femerao bypass    . LEFT HEART CATH AND CORONARY ANGIOGRAPHY N/A 02/03/2017   Procedure: LEFT HEART CATH AND CORONARY ANGIOGRAPHY;  Surgeon: Martinique, Peter M, MD;  Location: [place] [facility];  Service: Cardiovascular;  Laterality: N/A;  . LEFT HEART CATH AND CORONARY ANGIOGRAPHY N/A 09/14/2019   Procedure: LEFT HEART CATH AND CORONARY ANGIOGRAPHY;  Surgeon: Martinique, Peter M, MD;  Location: [facility];  Service: Cardiovascular;  Laterality: N/A;  . PATCH ANGIOPLASTY Left 03/26/2017   Procedure: PATCH ANGIOPLASTY LEFT CAROTID ARTERY;  Surgeon: Rosetta Posner, MD;  Location: [place];  Service: Vascular;  Laterality: Left;  . SCALP LACERATION REPAIR N/A 05/23/2013   Procedure: EXCISION SCALP ULCER DEBRIDEMENT OF SKIN AND BONE WITH PLACEMENT OF A-CELL;  Surgeon: Irene Limbo, MD;  Location: [facility];  Service: Plastics;  Laterality: N/A;  . STOMACH SURGERY  x2  . VASCULAR SURGERY     AFBG 10/15/05   HPI:  84 y.o. male with past medical history significant for prior stroke, carotid artery stenosis, coronary artery disease status post recent PCI on 09/15/19 with aphasia and right-sided weakness.  Stat CT head negative for hemorrhage and patient received IV TPA.     Assessment / Plan /  Recommendation Clinical Impression  Pt presents with much improved speech/language since time of admission.  Output is fluent, with no dysarthria.  There are occasional islands of hesitancy and motor-sequencing errors, which pt immediately recognizes and successfully corrects.  He demonstrated excellent comprehension of three-step commands, yes/no questions, and paragraph information.  Divergent naming was WNL.  Oral reading WNL. Able to repeat phonetically complex sentences (e.g, the spy fled to [place]).  Anticipate ongoing improvements.  No formalized speech therapy will be needed. Our service will sign off. Pt/wife agree with plan    SLP Assessment  SLP Recommendation/Assessment: Patient does not need any further Speech Lanaguage Pathology Services    Follow Up Recommendations  None    Frequency and Duration   n/a        SLP Evaluation Cognition  Overall Cognitive Status: Within Functional Limits for tasks assessed Arousal/Alertness: Awake/alert Orientation Level: Oriented X4 Attention: Sustained Memory: Appears intact Awareness: Appears intact       Comprehension  Auditory Comprehension Overall Auditory Comprehension: Appears within functional limits for tasks assessed Yes/No Questions: Within Functional Limits Commands: Within Functional Limits Conversation: Complex Visual Recognition/Discrimination Discrimination: Within Function Limits Reading Comprehension Reading Status: Within funtional limits    Expression Expression Primary Mode of Expression: Verbal Verbal Expression Overall Verbal Expression: Impaired Initiation: No impairment Automatic Speech:  (wfl) Level of Generative/Spontaneous Verbalization: Conversation Repetition: No impairment Naming: No impairment Pragmatics: No impairment Other Verbal Expression Comments:  (occasional dysfluency with good recognition and self-correct) Written Expression Dominant Hand: Right Written Expression: Not tested    Oral / Motor  Oral Motor/Sensory Function Overall Oral Motor/Sensory Function: Within functional limits Motor Speech Overall Motor Speech: Appears within functional limits for tasks assessed   GO                   K[place]e Ciavarella L. Tivis Ringer, [place] CCC/SLP Acute Rehabilitation Services Office number [phone] Pager [phone]  Juan Quam Laurice 09/22/2019, 11:12 AM

## 2019-09-22 NOTE — Progress Notes (Signed)
  Echocardiogram 2D Echocardiogram has been performed.  Earl Mueller Earl Mueller 09/22/2019, 10:27 AM

## 2019-09-23 ENCOUNTER — Inpatient Hospital Stay (HOSPITAL_COMMUNITY): Payer: Medicare HMO

## 2019-09-23 DIAGNOSIS — E876 Hypokalemia: Secondary | ICD-10-CM

## 2019-09-23 DIAGNOSIS — I25119 Atherosclerotic heart disease of native coronary artery with unspecified angina pectoris: Secondary | ICD-10-CM

## 2019-09-23 DIAGNOSIS — E7800 Pure hypercholesterolemia, unspecified: Secondary | ICD-10-CM

## 2019-09-23 DIAGNOSIS — I255 Ischemic cardiomyopathy: Secondary | ICD-10-CM

## 2019-09-23 DIAGNOSIS — N179 Acute kidney failure, unspecified: Secondary | ICD-10-CM

## 2019-09-23 DIAGNOSIS — I6340 Cerebral infarction due to embolism of unspecified cerebral artery: Secondary | ICD-10-CM

## 2019-09-23 DIAGNOSIS — I639 Cerebral infarction, unspecified: Secondary | ICD-10-CM

## 2019-09-23 LAB — LIPID PANEL
Cholesterol: 124 mg/dL (ref 0–200)
HDL: 51 mg/dL (ref >40)
LDL Cholesterol: 63 mg/dL (ref 0–99)
Total CHOL/HDL Ratio: 2.4 RATIO
Triglycerides: 52 mg/dL (ref <150)
VLDL: 10 mg/dL (ref 0–40)

## 2019-09-23 LAB — HEMOGLOBIN A1C
Hgb A1c MFr Bld: 5.6 % (ref 4.8–5.6)
Mean Plasma Glucose: 114.02 mg/dL

## 2019-09-23 LAB — BASIC METABOLIC PANEL
Anion gap: 10 (ref 5–15)
BUN: 14 mg/dL (ref 8–23)
CO2: 24 mmol/L (ref 22–32)
Calcium: 8.5 mg/dL — ABNORMAL LOW (ref 8.9–10.3)
Chloride: 102 mmol/L (ref 98–111)
Creatinine, Ser: 1.02 mg/dL (ref 0.61–1.24)
GFR calc Af Amer: >60 mL/min (ref >60)
GFR calc non Af Amer: >60 mL/min (ref >60)
Glucose, Bld: 95 mg/dL (ref 70–99)
Potassium: 3.3 mmol/L — ABNORMAL LOW (ref 3.5–5.1)
Sodium: 136 mmol/L (ref 135–145)

## 2019-09-23 LAB — CBC
HCT: 40.3 % (ref 39.0–52.0)
Hemoglobin: 13.4 g/dL (ref 13.0–17.0)
MCH: 32.8 pg (ref 26.0–34.0)
MCHC: 33.3 g/dL (ref 30.0–36.0)
MCV: 98.8 fL (ref 80.0–100.0)
Platelets: 340 10*3/uL (ref 150–400)
RBC: 4.08 MIL/uL — ABNORMAL LOW (ref 4.22–5.81)
RDW: 14.9 % (ref 11.5–15.5)
WBC: 8.1 10*3/uL (ref 4.0–10.5)
nRBC: 0 % (ref 0.0–0.2)

## 2019-09-23 MED ORDER — POTASSIUM CHLORIDE CRYS ER 20 MEQ PO TBCR
20.0000 meq | EXTENDED_RELEASE_TABLET | Freq: Two times a day (BID) | ORAL | Status: AC
  Administered 2019-09-23 (×2): 20 meq via ORAL
  Filled 2019-09-23 (×2): qty 1

## 2019-09-23 NOTE — Progress Notes (Signed)
STROKE TEAM PROGRESS NOTE   INTERVAL HISTORY RN and wife and daughter are at the bedside. Pt sitting in chair, doing well, neuro intact. Pending loop recorder on Monday.   Vitals:   09/23/19 0200 09/23/19 0300 09/23/19 0400 09/23/19 0500  BP: 134/66 133/67 (!) 131/57 139/66  Pulse: 61 60 (!) 54 60  Resp: 15 17 12 14   Temp:   98 F (36.7 C)   TempSrc:   Oral   SpO2: 99% 97% 99% 95%  Weight:       CBC:  Recent Labs  Lab 09/21/19 1953 09/23/19 0257  WBC 8.0 8.1  NEUTROABS 5.3  --   HGB 14.9  15.3 13.4  HCT 44.8  45.0 40.3  MCV 99.8 98.8  PLT 346 517   Basic Metabolic Panel:  Recent Labs  Lab 09/21/19 1953 09/23/19 0257  NA 138  137 136  K 3.6  3.6 3.3*  CL 106  104 102  CO2 19* 24  GLUCOSE 154*  152* 95  BUN 15  16 14   CREATININE 1.63*  1.60* 1.02  CALCIUM 9.4 8.5*   Lipid Panel:  Recent Labs  Lab 09/23/19 0257  CHOL 124  TRIG 52  HDL 51  CHOLHDL 2.4  VLDL 10  LDLCALC 63   HgbA1c:  Recent Labs  Lab 09/23/19 0257  HGBA1C 5.6   Urine Drug Screen:  Recent Labs  Lab 09/21/19 2120  LABOPIA NONE DETECTED  COCAINSCRNUR NONE DETECTED  LABBENZ NONE DETECTED  AMPHETMU NONE DETECTED  THCU NONE DETECTED  LABBARB NONE DETECTED    Alcohol Level  Recent Labs  Lab 09/21/19 1953  ETH <10    IMAGING past 24 hours  MR BRAIN WO CONTRAST 09/22/2019 IMPRESSION:  Acute cortical infarction in the left parietal lobe. Few other scattered left parietal acute infarctions affecting the cortical and subcortical brain. Small acute infarction affecting the right posterior frontal subcortical white matter. Punctate acute infarction in the right cerebellum. This distribution of acute infarctions suggests embolic disease from the heart or ascending aorta. No mass effect or hemorrhage. Chronic small-vessel ischemic changes otherwise throughout the cerebral hemispheric white matter.  CTA Head and Neck 09/21/2019 IMPRESSION: 1. No emergent large vessel occlusion. 2.  Moderate right and severe left proximal cavernous segment stenosis of the internal carotid arteries secondary to atherosclerotic calcification. 3. Slightly worsened atherosclerotic stenosis of the proximal right internal carotid artery, measuring 60% by NASCET criteria. 4. Improved patency of left internal carotid artery. 5. At least moderate narrowing of the left vertebral artery origin. 6. Small pleural effusions and diffuse interstitial prominence, compatible with interstitial lung disease. 7. Aortic Atherosclerosis (ICD10-I70.0).  CT Angio Chest PE W/Cm &/Or Wo Cm 09/13/2019 IMPRESSION:  1. No evidence of pulmonary embolus.  2. Interstitial edema and trace bilateral pleural effusions, superimposed upon severe emphysema.  3. Aortic Atherosclerosis (ICD10-I70.0) and Emphysema (ICD10-J43.9).   CARDIAC CATHETERIZATION 09/14/2019 Impression  1. Severe 2 vessel obstructive CAD with left dominant circulation. Severely calcified vessels.  2. Low LV EDP 5 mm Hg  3. Successful orbital atherectomy and stenting of the proximal LAD with DES x 1 Plan: Continue DAPT with ASA and Plavix indefinitely. Planned staged atherectomy and stenting of the proximal LCx tomorrow if stable.   ECHOCARDIOGRAM COMPLETE 09/22/2019 IMPRESSIONS   1. LV function is depressed with hypokinesis of the inferior, inferolateral walls.     Compared to echo images from previous echo 09/14/19 there is no significant change. Left ventricular ejection fraction, by estimation, is 35  to 40%. The left ventricle has moderately decreased function. Left ventricular diastolic parameters are consistent with Grade I diastolic dysfunction (impaired relaxation).   2. Right ventricular systolic function is normal. The right ventricular size is normal. There is normal pulmonary artery systolic pressure.   3. The mitral valve is abnormal. Trivial mitral valve regurgitation.   4. The aortic valve is abnormal. Aortic valve regurgitation is mild. Mild  aortic valve sclerosis is present, with no evidence of aortic valve stenosis.   5. The inferior vena cava is normal in size with greater than 50% respiratory variability, suggesting right atrial pressure of 3 mmHg.    PHYSICAL EXAM Pleasant elderly male not in distress. . Afebrile. Head is nontraumatic. Neck is supple without bruit.    Cardiac exam no murmur or gallop. Lungs are clear to auscultation. Distal pulses are well felt.  He has a bandage over her right radial wrist post TPA bleeding.  There are also bruises in the right antecubital and left forearm. Neurological Exam ;  Awake  Alert oriented x 3.  Normal speech, no aphasia, good comprehension, naming and repetition.  Visual field full. Eye movements full without nystagmus or gaze palsy. Fundi were not visualized. Hearing is normal. Palatal movements are normal. Face symmetric. Tongue midline. Normal strength, tone, reflexes and coordination. Normal sensation. Gait deferred.  ASSESSMENT/PLAN Earl Mueller is a 84 y.o. male with history of prior stroke, carotid artery stenosis, coronary artery disease status post recent PCI on 09/15/19 presenting with sudden onset expressive aphasia, R facial droop and R hemiparesis. Received IV tPA 09/21/2019 at 2007.   Stroke:   Multifocal cerebral infarct s/p tPA, embolic pattern, source unclear  Code Stroke CT head No acute abnormality. ASPECTS 10.     CTA head & neck no LVO.  Right petrous ICA, bilateral ICA siphon high-grade stenosis, proximal R ICA 60%, moderate narrowing L VA origin.   MRI - Acute cortical infarction in the left parietal lobe. Few other scattered left parietal acute infarctions affecting the cortical and subcortical brain. Small acute infarction affecting the right poster frontal subcortical white matter. Punctate acute infarction in the right cerebellum. Chronic small-vessel ischemic changes otherwise throughout the cerebral hemispheric white matter.  2D Echo 07/2019 EF 39%.  Severe LVH. This admission EF 35 to 40%.    LE venous doppler no DVT  Loop recorder planned Monday before discharge  LDL - 63  HgbA1c - 5.6   VTE prophylaxis - SCDs   aspirin 81 mg daily, clopidogrel 75 mg daily prior to admission, now on aspirin 81 mg daily and Plavix 75 mg daily. Continue on discharge  Therapy recommendations:  HH PT  Disposition:  Pending  R radial hematoma s/p tPA  Site of recent cardiac cath PCI 09/14/2019  20 mins compression w/ dsg placed  Site sore but stable    CAD   s/p PCI and stent 09/15/2019   Follow with cardiologist Dr. Geraldo Pitter  On DAPT for at least 1 year, continue on discharge  Cardiomyopathy  TTE 07/2019 EF 39%  TTE showed EF 35 to 40% this admission  On DAPT  Follow-up with Dr. Geraldo Pitter as outpatient  Hypertension  Home meds:  Losartan 25, metoprolol XL 25  Stable on the low end  Metoprolol resumed . BP goal < 180  . 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   Hospital day # 2  This patient is critically ill due to ischemic stroke status post TPA, cardiomyopathy, CAD status post PCI and stenting and at significant risk of neurological worsening, death form recurrent stroke, hemorrhagic conversion,  heart failure, seizure. This patient's care requires constant monitoring of vital signs, hemodynamics, respiratory and cardiac monitoring, review of multiple databases, neurological assessment, discussion with family, other specialists and medical decision making of high complexity. I spent 45 minutes of neurocritical care time in the care of this patient. I had long discussion with daughter and wife at bedside, updated pt current condition, treatment plan and potential prognosis, and answered all the questions.  They expressed understanding and appreciation.    Rosalin Hawking, MD PhD Stroke Neurology 09/23/2019 6:17 PM   To contact Stroke Continuity provider, please refer to [email or web address] After hours, contact General Neurology

## 2019-09-23 NOTE — Evaluation (Signed)
Physical Therapy Evaluation Patient Details Name: Earl Mueller MRN: [id number] DOB: Feb 21, 1936 Today's Date: 09/23/2019   History of Present Illness  84 y.o. male with past medical history significant for prior stroke, blind rt eye, carotid artery stenosis, coronary artery disease status post recent PCI to LAD on 09/15/19 presents as a code stroke to the emergency department sudden onset aphasia and R weakness; Pt received IV tPA. rt radial hematoma (site of PCA 7/1); MRI brain acute left parietal infarctions (scattered cortical and subcortical); rt posterior frontal infact; punctate infact rt cerebellum;   Clinical Impression   Pt admitted with above diagnosis. Despite multiple areas of infarction, pt moving remarkably well. He walked 200 ft with no device and able to tolerate higher level challenges without imbalance. Initiated discussion re: follow-up PT (pt hopeful for discharge home today), and pt/wife agreed to recommendation for HHPT to further assess balance in his home environment and to initiate HEP.  Pt currently with functional limitations due to the deficits listed below (see PT Problem List). Pt will benefit from skilled PT to increase their independence and safety with mobility to allow discharge to the venue listed below.       Follow Up Recommendations Home health PT;Supervision - Intermittent    Equipment Recommendations  None recommended by PT    Recommendations for Other Services       Precautions / Restrictions Precautions Precautions: Fall      Mobility  Bed Mobility Overal bed mobility: Modified Independent Bed Mobility: Supine to Sit     Supine to sit: Modified independent (Device/Increase time)     General bed mobility comments: incr time due to ICU/air mattress  Transfers Overall transfer level: Needs assistance Equipment used: None Transfers: Sit to/from Stand Sit to Stand: Supervision         General transfer comment: no imbalance noted;  supervision for multiple lines  Ambulation/Gait Ambulation/Gait assistance: Min guard;Supervision Gait Distance (Feet): 200 Feet Assistive device: None Gait Pattern/deviations: Step-through pattern;Decreased stride length;Wide base of support Gait velocity: able to vary up/down Gait velocity interpretation: 1.31 - 2.62 ft/sec, indicative of limited community ambulator General Gait Details: no overt loss of balance; initially appeared antalgic, however pt reported his legs were "stiff from laying in bed." His gait pattern did improve as he continued walking  Stairs            Wheelchair Mobility    Modified Rankin (Stroke Patients Only) Modified Rankin (Stroke Patients Only) Pre-Morbid Rankin Score: No significant disability Modified Rankin: Moderate disability     Balance Overall balance assessment: Needs assistance Sitting-balance support: No upper extremity supported;Feet unsupported Sitting balance-Leahy Scale: Good Sitting balance - Comments: reaching to pull up socks   Standing balance support: No upper extremity supported Standing balance-Leahy Scale: Good           Rhomberg - Eyes Opened: 0 (immediate LOB to left) Rhomberg - Eyes Closed:  (feet apart EC x 20 sec no incr sway) High level balance activites: Sudden stops;Head turns;Direction changes;Turns High Level Balance Comments: no LOB             Pertinent Vitals/Pain Pain Assessment: No/denies pain    Home Living Family/patient expects to be discharged to:: Private residence Living Arrangements: Spouse/significant other Available Help at Discharge: Friend(s) Type of Home: House Home Access: Stairs to enter   [employer] of Steps: 1 Home Layout: Two level;Other (Comment) (lift chair) Home Equipment: Walker - 2 wheels;Cane - single point;Shower seat - built in  Prior Function Level of Independence: Independent         Comments: pt/family report he is very sedentary (even  before PCI); denies h/o imbalance or falls     Hand Dominance   Dominant Hand: Right    Extremity/Trunk Assessment   Upper Extremity Assessment Upper Extremity Assessment: Defer to OT evaluation    Lower Extremity Assessment Lower Extremity Assessment: Generalized weakness (no significant difference rt vs lt)    Cervical / Trunk Assessment Cervical / Trunk Assessment: Normal  Communication   Communication: HOH  Cognition Arousal/Alertness: Awake/alert Behavior During Therapy: WFL for tasks assessed/performed Overall Cognitive Status: Within Functional Limits for tasks assessed (a&ox4)                                        General Comments General comments (skin integrity, edema, etc.): wife and daughter present. As discussing possible reason for HHPT (to set up HEP and incr his activity), pt continued to deny that he was going to start exercising.     Exercises     Assessment/Plan    PT Assessment Patient needs continued PT services  PT Problem List Decreased strength;Decreased activity tolerance;Decreased balance;Decreased mobility;Cardiopulmonary status limiting activity       PT Treatment Interventions DME instruction;Gait training;Stair training;Functional mobility training;Therapeutic activities;Therapeutic exercise;Balance training;Neuromuscular re-education;Patient/family education    PT Goals (Current goals can be found in the Care Plan section)  Acute Rehab PT Goals Patient Stated Goal: "to go home today" PT Goal Formulation: With patient/family Time For Goal Achievement: 10/07/19 Potential to Achieve Goals: Good    Frequency Min 3X/week   Barriers to discharge        Co-evaluation               AM-PAC PT "6 Clicks" Mobility  Outcome Measure Help needed turning from your back to your side while in a flat bed without using bedrails?: None Help needed moving from lying on your back to sitting on the side of a flat bed without  using bedrails?: None Help needed moving to and from a bed to a chair (including a wheelchair)?: None Help needed standing up from a chair using your arms (e.g., wheelchair or bedside chair)?: None Help needed to walk in hospital room?: A Little Help needed climbing 3-5 steps with a railing? : A Little 6 Click Score: 22    End of Session Equipment Utilized During Treatment: Gait belt;Oxygen Activity Tolerance: Patient tolerated treatment well (on 4L [place] with sats 94-95%) Patient left: in chair;with call bell/phone within reach;with chair alarm set;with family/visitor present Nurse Communication: Mobility status PT Visit Diagnosis: Muscle weakness (generalized) (M62.81);Unsteadiness on feet (R26.81)    Time: 4081-4481 PT Time Calculation (min) (ACUTE ONLY): 39 min   Charges:   PT Evaluation $PT Eval Moderate Complexity: 1 Mod PT Treatments $Gait Training: 8-22 mins         Arby Barrette, PT Pager [phone]   Rexanne Mano 09/23/2019, 12:35 PM

## 2019-09-23 NOTE — Evaluation (Signed)
Occupational Therapy Evaluation Patient Details Name: Earl Mueller MRN: [id number] DOB: Aug 25, 1935 Today's Date: 09/23/2019    History of Present Illness 84 y.o. male with past medical history significant for prior stroke, blind rt eye, carotid artery stenosis, coronary artery disease status post recent PCI to LAD on 09/15/19 presents as a code stroke to the emergency department sudden onset aphasia and R weakness; Pt received IV tPA. rt radial hematoma (site of PCA 7/1); MRI brain acute left parietal infarctions (scattered cortical and subcortical); rt posterior frontal infact; punctate infact rt cerebellum;    Clinical Impression   Patient evaluated by Occupational Therapy with no further acute OT needs identified. All education has been completed and the patient has no further questions. Pt is able to perform ADLs at supervision level, but should progress quickly to mod I.  He does not appear to have visual or cognitive deficits, and has good family support. See below for any follow-up Occupational Therapy or equipment needs. OT is signing off. Thank you for this referral.      Follow Up Recommendations  No OT follow up;Supervision - Intermittent    Equipment Recommendations  None recommended by OT    Recommendations for Other Services       Precautions / Restrictions Precautions Precautions: Fall      Mobility Bed Mobility Overal bed mobility: Modified Independent Bed Mobility: Supine to Sit     Supine to sit: Modified independent (Device/Increase time)     General bed mobility comments: Pt in recliner   Transfers Overall transfer level: Needs assistance Equipment used: None Transfers: Sit to/from [facility] Sit to Stand: Supervision Stand pivot transfers: Supervision       General transfer comment: supervision for safety due to lines and tubes     Balance Overall balance assessment: Needs assistance Sitting-balance support: No upper extremity  supported;Feet unsupported Sitting balance-Leahy Scale: Good Sitting balance - Comments: reaching to pull up socks   Standing balance support: No upper extremity supported Standing balance-Leahy Scale: Good           Rhomberg - Eyes Opened: 0 (immediate LOB to left) Rhomberg - Eyes Closed:  (feet apart EC x 20 sec no incr sway) High level balance activites: Sudden stops;Head turns;Direction changes;Turns High Level Balance Comments: no LOB           ADL either performed or assessed with clinical judgement   ADL Overall ADL's : Needs assistance/impaired Eating/Feeding: Independent   Grooming: Wash/dry hands;Wash/dry face;Oral care;Brushing hair;Supervision/safety;Standing   Upper Body Bathing: Set up;Sitting   Lower Body Bathing: Supervison/ safety;Sit to/from stand   Upper Body Dressing : Set up;Sitting   Lower Body Dressing: Supervision/safety;Sit to/from stand   Toilet Transfer: Supervision/safety;Ambulation;Comfort height toilet   Toileting- Clothing Manipulation and Hygiene: Supervision/safety;Sit to/from stand       Functional mobility during ADLs: Supervision/safety General ADL Comments: DOE 2/4 with activity on 3L supplemental 02      Vision Baseline Vision/History: Wears glasses (blind Rt eye ) Wears Glasses: At all times Patient Visual Report: No change from baseline Vision Assessment?: Yes Eye Alignment: Within Functional Limits Ocular Range of Motion: Within Functional Limits Alignment/Gaze Preference: Within Defined Limits Tracking/Visual Pursuits: Able to track stimulus in all quads without difficulty Visual Fields: No apparent deficits     Perception Perception Perception Tested?: Yes   Praxis Praxis Praxis tested?: Within functional limits    Pertinent Vitals/Pain Pain Assessment: No/denies pain     Hand Dominance Right   Extremity/Trunk  Assessment Upper Extremity Assessment Upper Extremity Assessment: Overall WFL for tasks  assessed   Lower Extremity Assessment Lower Extremity Assessment: Defer to PT evaluation   Cervical / Trunk Assessment Cervical / Trunk Assessment: Normal   Communication Communication Communication: HOH   Cognition Arousal/Alertness: Awake/alert Behavior During Therapy: WFL for tasks assessed/performed Overall Cognitive Status: Within Functional Limits for tasks assessed                                 General Comments: Pt scored 4/28 on the Short Blessed Test which is WNL    General Comments  VSS     Exercises     Shoulder Instructions      Home Living Family/patient expects to be discharged to:: Private residence Living Arrangements: Spouse/significant other Available Help at Discharge: Friend(s) Type of Home: House Home Access: Stairs to enter [employer] of Steps: 1   Home Layout: Two level;Other (Comment) (lift chair) Alternate Level Stairs-Number of Steps: full flight - has a stair lift    Bathroom Shower/Tub: Walk-in shower   Bathroom Toilet: Handicapped height     Home Equipment: Environmental consultant - 2 wheels;Cane - single point;Shower seat - built in          Prior Functioning/Environment Level of Independence: Independent        Comments: pt/family report he is very sedentary (even before PCI); denies h/o imbalance or falls.  He reports that showering and drying off fatigues him, but he rebouds after lying down and taking a short rest.  He enjoys traveling to visit family         OT Problem List: Impaired balance (sitting and/or standing)      OT Treatment/Interventions:      OT Goals(Current goals can be found in the care plan section) Acute Rehab OT Goals Patient Stated Goal: To leave the hospital  OT Goal Formulation: All assessment and education complete, DC therapy  OT Frequency:     Barriers to D/C:            Co-evaluation              AM-PAC OT "6 Clicks" Daily Activity     Outcome Measure Help from  another person eating meals?: None Help from another person taking care of personal grooming?: A Little Help from another person toileting, which includes using toliet, bedpan, or urinal?: A Little Help from another person bathing (including washing, rinsing, drying)?: A Little Help from another person to put on and taking off regular upper body clothing?: A Little Help from another person to put on and taking off regular lower body clothing?: A Little 6 Click Score: 19   End of Session Equipment Utilized During Treatment: Gait belt;Oxygen Nurse Communication: Mobility status  Activity Tolerance: Patient tolerated treatment well Patient left: in chair;with call bell/phone within reach;with chair alarm set  OT Visit Diagnosis: Unsteadiness on feet (R26.81)                Time: 8469-6295 OT Time Calculation (min): 27 min Charges:  OT General Charges $OT Visit: 1 Visit OT Evaluation $OT Eval Low Complexity: 1 Low OT Treatments $Self Care/Home Management : 8-22 mins  Nilsa Nutting., OTR/L Acute Rehabilitation Services Pager [phone] Office [place], [place] 09/23/2019, 1:08 PM

## 2019-09-23 NOTE — Progress Notes (Signed)
Bilateral lower extremity venous duplex completed. Refer to "CV Proc" under chart review to view preliminary results.  09/23/2019 10:30 AM Kelby Aline., MHA, RVT, RDCS, RDMS

## 2019-09-24 DIAGNOSIS — D72829 Elevated white blood cell count, unspecified: Secondary | ICD-10-CM

## 2019-09-24 LAB — CBC
HCT: 40 % (ref 39.0–52.0)
Hemoglobin: 13.4 g/dL (ref 13.0–17.0)
MCH: 33.6 pg (ref 26.0–34.0)
MCHC: 33.5 g/dL (ref 30.0–36.0)
MCV: 100.3 fL — ABNORMAL HIGH (ref 80.0–100.0)
Platelets: 327 10*3/uL (ref 150–400)
RBC: 3.99 MIL/uL — ABNORMAL LOW (ref 4.22–5.81)
RDW: 14.9 % (ref 11.5–15.5)
WBC: 10.6 10*3/uL — ABNORMAL HIGH (ref 4.0–10.5)
nRBC: 0 % (ref 0.0–0.2)

## 2019-09-24 LAB — BASIC METABOLIC PANEL
Anion gap: 11 (ref 5–15)
BUN: 15 mg/dL (ref 8–23)
CO2: 22 mmol/L (ref 22–32)
Calcium: 8.9 mg/dL (ref 8.9–10.3)
Chloride: 103 mmol/L (ref 98–111)
Creatinine, Ser: 1.25 mg/dL — ABNORMAL HIGH (ref 0.61–1.24)
GFR calc Af Amer: >60 mL/min (ref >60)
GFR calc non Af Amer: 53 mL/min — ABNORMAL LOW (ref >60)
Glucose, Bld: 103 mg/dL — ABNORMAL HIGH (ref 70–99)
Potassium: 3.7 mmol/L (ref 3.5–5.1)
Sodium: 136 mmol/L (ref 135–145)

## 2019-09-24 MED ORDER — SODIUM CHLORIDE 0.9 % IV SOLN: INTRAVENOUS | Status: DC

## 2019-09-24 NOTE — Progress Notes (Signed)
PT Cancellation Note  Patient Details Name: Earl Mueller MRN: [id number] DOB: 1935-09-27   Cancelled Treatment:    Reason Eval/Treat Not Completed: Patient declined, no reason specified; reports just got to sleep and wanting a nap before walking, encouraged to walk later with nursing staff. RN aware.  Will check back another day.   Reginia Naas 09/24/2019, 4:34 PM\ Magda Kiel, PT Acute Rehabilitation Services YKDXI:[phone] Office:[phone] 09/24/2019

## 2019-09-24 NOTE — Progress Notes (Signed)
STROKE TEAM PROGRESS NOTE   INTERVAL HISTORY Wife at bedside. Pt sitting in chair, no complains. Pending loop recorder tomorrow. On acute event overnight.   Vitals:   09/24/19 0300 09/24/19 0400 09/24/19 0500 09/24/19 0600  BP: (!) 131/56 125/68 122/71 140/70  Pulse: 61 (!) 59 60 60  Resp: 16 15 10 16   Temp:  97.6 F (36.4 C)    TempSrc:  Oral    SpO2: 93% 94% 96% 93%  Weight:       CBC:  Recent Labs  Lab 09/21/19 1953 09/21/19 1953 09/23/19 0257 09/24/19 0335  WBC 8.0   < > 8.1 10.6*  NEUTROABS 5.3  --   --   --   HGB 14.9  15.3   < > 13.4 13.4  HCT 44.8  45.0   < > 40.3 40.0  MCV 99.8   < > 98.8 100.3*  PLT 346   < > 340 327   < > = values in this interval not displayed.   Basic Metabolic Panel:  Recent Labs  Lab 09/23/19 0257 09/24/19 0335  NA 136 136  K 3.3* 3.7  CL 102 103  CO2 24 22  GLUCOSE 95 103*  BUN 14 15  CREATININE 1.02 1.25*  CALCIUM 8.5* 8.9   Lipid Panel:  Recent Labs  Lab 09/23/19 0257  CHOL 124  TRIG 52  HDL 51  CHOLHDL 2.4  VLDL 10  LDLCALC 63   HgbA1c:  Recent Labs  Lab 09/23/19 0257  HGBA1C 5.6   Urine Drug Screen:  Recent Labs  Lab 09/21/19 2120  LABOPIA NONE DETECTED  COCAINSCRNUR NONE DETECTED  LABBENZ NONE DETECTED  AMPHETMU NONE DETECTED  THCU NONE DETECTED  LABBARB NONE DETECTED    Alcohol Level  Recent Labs  Lab 09/21/19 1953  ETH <10    IMAGING past 24 hours  MR BRAIN WO CONTRAST 09/22/2019 IMPRESSION:  Acute cortical infarction in the left parietal lobe. Few other scattered left parietal acute infarctions affecting the cortical and subcortical brain. Small acute infarction affecting the right posterior frontal subcortical white matter. Punctate acute infarction in the right cerebellum. This distribution of acute infarctions suggests embolic disease from the heart or ascending aorta. No mass effect or hemorrhage. Chronic small-vessel ischemic changes otherwise throughout the cerebral hemispheric white  matter.  CTA Head and Neck 09/21/2019 IMPRESSION: 1. No emergent large vessel occlusion. 2. Moderate right and severe left proximal cavernous segment stenosis of the internal carotid arteries secondary to atherosclerotic calcification. 3. Slightly worsened atherosclerotic stenosis of the proximal right internal carotid artery, measuring 60% by NASCET criteria. 4. Improved patency of left internal carotid artery. 5. At least moderate narrowing of the left vertebral artery origin. 6. Small pleural effusions and diffuse interstitial prominence, compatible with interstitial lung disease. 7. Aortic Atherosclerosis (ICD10-I70.0).  CT Angio Chest PE W/Cm &/Or Wo Cm 09/13/2019 IMPRESSION:  1. No evidence of pulmonary embolus.  2. Interstitial edema and trace bilateral pleural effusions, superimposed upon severe emphysema.  3. Aortic Atherosclerosis (ICD10-I70.0) and Emphysema (ICD10-J43.9).   CARDIAC CATHETERIZATION with PCI and DES LAD 09/14/2019 Impression  1. Severe 2 vessel obstructive CAD with left dominant circulation. Severely calcified vessels.  2. Low LV EDP 5 mm Hg  3. Successful orbital atherectomy and stenting of the proximal LADwith DES x 1 Plan: Continue DAPT with ASA and Plavix indefinitely.   CARDIAC CATHETERIZATION with PCI and DES CFX 09/15/2019  Ost Cx to Prox Cx lesion is 80% stenosed.  Post intervention, there  is a 0% residual stenosis.  A drug-eluting stent was successfully placed using a SYNERGY XD [id number]. 1. Successful PCI of the ostial/proximal LCx with orbital atherectomy and DES x 1. Plan: DAPT for at least one year. Anticipate DC in am.   ECHOCARDIOGRAM COMPLETE 09/22/2019 IMPRESSIONS   1. LV function is depressed with hypokinesis of the inferior, inferolateral walls.     Compared to echo images from previous echo 09/14/19 there is no significant change. Left ventricular ejection fraction, by estimation, is 35 to 40%. The left ventricle has moderately  decreased function. Left ventricular diastolic parameters are consistent with Grade I diastolic dysfunction (impaired relaxation).   2. Right ventricular systolic function is normal. The right ventricular size is normal. There is normal pulmonary artery systolic pressure.   3. The mitral valve is abnormal. Trivial mitral valve regurgitation.   4. The aortic valve is abnormal. Aortic valve regurgitation is mild. Mild aortic valve sclerosis is present, with no evidence of aortic valve stenosis.   5. The inferior vena cava is normal in size with greater than 50% respiratory variability, suggesting right atrial pressure of 3 mmHg.    PHYSICAL EXAM Pleasant elderly male not in distress. . Afebrile. Head is nontraumatic. Neck is supple without bruit.    Cardiac exam no murmur or gallop. Lungs are clear to auscultation. Distal pulses are well felt.  He has a bandage over her right radial wrist post TPA bleeding.  There are also bruises in the right antecubital and left forearm. Neurological Exam ;  Awake  Alert oriented x 3.  Normal speech, no aphasia, good comprehension, naming and repetition.  Visual field full. Eye movements full without nystagmus or gaze palsy. Fundi were not visualized. Hearing is normal. Palatal movements are normal. Face symmetric. Tongue midline. Normal strength, tone, reflexes and coordination. Normal sensation. Gait deferred.  ASSESSMENT/PLAN Mr. AUREL NGUYEN is a 84 y.o. male with history of prior stroke, carotid artery stenosis, coronary artery disease status post LAD drug eluting stent 09/14/2019 and Cfx drug eluting stent 09/15/2019 presenting with sudden onset expressive aphasia, R facial droop and R hemiparesis. Received IV tPA 09/21/2019 at 2007.   Stroke:   Multifocal cerebral infarct s/p tPA, embolic pattern, source unclear  Code Stroke CT head No acute abnormality. ASPECTS 10.     CTA head & neck no LVO.  Right petrous ICA, bilateral ICA siphon high-grade stenosis,  proximal R ICA 60%, moderate narrowing L VA origin.   MRI - Acute cortical infarction in the left parietal lobe. Few other scattered left parietal acute infarctions affecting the cortical and subcortical brain. Small acute infarction affecting the right poster frontal subcortical white matter. Punctate acute infarction in the right cerebellum. Chronic small-vessel ischemic changes otherwise throughout the cerebral hemispheric white matter.  2D Echo 07/2019 EF 39%. Severe LVH. This admission EF 35 to 40%.    LE venous doppler no DVT  Loop recorder planned Monday before discharge  LDL - 63  HgbA1c - 5.6   VTE prophylaxis - SCDs   aspirin 81 mg daily, clopidogrel 75 mg daily prior to admission, now on aspirin 81 mg daily and Plavix 75 mg daily. Continue on discharge  Therapy recommendations:  HH PT  Disposition:  Pending  R radial hematoma s/p tPA  Site of recent cardiac cath PCI 09/14/2019  20 mins compression w/ dsg placed  Site sore but stable    CAD   Status post LAD PCI / drug eluting stent 09/14/2019 and Cfx PCI /  drug eluting stent 09/15/2019  Follow with cardiologist Dr. Geraldo Pitter  On DAPT for at least 1 year, continue on discharge  Cardiomyopathy  TTE 07/2019 EF 39%  TTE showed EF 35 to 40% this admission  On DAPT  Follow-up with Dr. Geraldo Pitter as outpatient  Hypertension  Home meds:  Losartan 25, metoprolol XL 25  Stable on the low end  Metoprolol resumed . BP goal < 180  . 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  Aortic Atherosclerosis (ICD10-I70.0)  Hospital day # 3  Rosalin Hawking, MD PhD Stroke Neurology 09/24/2019 12:28 PM   To contact Stroke Continuity provider, please refer to [email or web address] After hours, contact General Neurology

## 2019-09-24 NOTE — Discharge Summary (Addendum)
Patient ID: Earl Mueller   MRN: [id number]      DOB: 1936/01/22  Date of Admission: 09/21/2019 Date of Discharge: 09/25/2019  Attending Physician:  Rosalin Hawking, MD, Stroke MD Consultant(s):   Treatment Team:  [facility]bcardiology, [place], MD  Virl Axe, MD (electrophysiology)  Patient's PCP:  Rochel Brome, MD  DISCHARGE DIAGNOSIS:  Principal Problem:   Cerebral embolism with cerebral infarction - multifocal, s/p tPA, source unknown  Active Problems:   Dyslipidemia   Essential hypertension   Peripheral vascular disease ([place])   Carotid artery stenosis   Ischemic heart disease   Hemiparesis, speech and language deficits, and cognitive deficits due to recent cerebral infarction [facility])   Cardiomyopathy, ischemic   Hypothyroidism   Depression   Hypokalemia   Emphysema of lung ([place])   Aortic atherosclerosis ([place])   Allergies as of 09/25/2019      Reactions   Other Other (See Comments)   Any narcotic makes him a "wild man"  Delusions (intolerance)      Medication List    STOP taking these medications   cilostazol 50 MG tablet Commonly known as: PLETAL     TAKE these medications   Acetaminophen 500 MG capsule Take 2 capsules (1,000 mg total) by mouth every 6 (six) hours as needed for fever or pain. What changed: when to take this   aspirin EC 81 MG tablet Take 81 mg by mouth daily.   atorvastatin 80 MG tablet Commonly known as: LIPITOR TAKE 1 TABLET EVERY DAY   CENTRUM SILVER PO Take 1 tablet by mouth daily.   clopidogrel 75 MG tablet Commonly known as: PLAVIX TAKE 1 TABLET EVERY DAY   fenofibrate 160 MG tablet Take 160 mg by mouth daily.   levothyroxine 112 MCG tablet Commonly known as: SYNTHROID Take 1 tablet (112 mcg total) by mouth daily.   losartan 25 MG tablet Commonly known as: COZAAR Take 1 tablet (25 mg total) by mouth daily.   metoprolol succinate 25 MG 24 hr tablet Commonly known as: TOPROL-XL Take 1 tablet (25 mg total) by mouth daily.    nitroGLYCERIN 0.4 MG SL tablet Commonly known as: NITROSTAT Place 0.4 mg under the tongue every 5 (five) minutes as needed for chest pain.   PARoxetine 20 MG tablet Commonly known as: PAXIL Take 20 mg by mouth daily.       LABORATORY STUDIES CBC    Component Value Date/Time   WBC 8.9 09/25/2019 0443   RBC 3.98 (L) 09/25/2019 0443   HGB 13.2 09/25/2019 0443   HGB 15.9 07/05/2019 1003   HCT 40.2 09/25/2019 0443   HCT 47.1 07/05/2019 1003   PLT 313 09/25/2019 0443   PLT 324 07/05/2019 1003   MCV 101.0 (H) 09/25/2019 0443   MCV 98 (H) 07/05/2019 1003   MCH 33.2 09/25/2019 0443   MCHC 32.8 09/25/2019 0443   RDW 14.8 09/25/2019 0443   RDW 12.9 07/05/2019 1003   LYMPHSABS 1.5 09/21/2019 1953   LYMPHSABS 2.1 01/29/2017 1122   MONOABS 0.7 09/21/2019 1953   EOSABS 0.4 09/21/2019 1953   EOSABS 0.6 (H) 01/29/2017 1122   BASOSABS 0.1 09/21/2019 1953   BASOSABS 0.0 01/29/2017 1122   CMP    Component Value Date/Time   NA 137 09/25/2019 0443   NA 137 07/05/2019 1003   K 3.6 09/25/2019 0443   CL 104 09/25/2019 0443   CO2 25 09/25/2019 0443   GLUCOSE 95 09/25/2019 0443   BUN 16 09/25/2019 0443  BUN 22 07/05/2019 1003   CREATININE 1.11 09/25/2019 0443   CALCIUM 8.7 (L) 09/25/2019 0443   PROT 7.1 09/21/2019 1953   PROT 7.8 07/05/2019 1003   ALBUMIN 3.2 (L) 09/21/2019 1953   ALBUMIN 4.5 07/05/2019 1003   AST 33 09/21/2019 1953   ALT 19 09/21/2019 1953   ALKPHOS 75 09/21/2019 1953   BILITOT 1.1 09/21/2019 1953   BILITOT 0.6 07/05/2019 1003   GFRNONAA >60 09/25/2019 0443   GFRAA >60 09/25/2019 0443   COAGS Lab Results  Component Value Date   INR 1.2 09/21/2019   INR 1.08 03/22/2017   INR 1.1 01/29/2017   Lipid Panel    Component Value Date/Time   CHOL 124 09/23/2019 0257   CHOL 148 07/05/2019 1003   TRIG 52 09/23/2019 0257   HDL 51 09/23/2019 0257   HDL 56 07/05/2019 1003   CHOLHDL 2.4 09/23/2019 0257   VLDL 10 09/23/2019 0257   LDLCALC 63 09/23/2019 0257    LDLCALC 79 07/05/2019 1003   HgbA1C  Lab Results  Component Value Date   HGBA1C 5.6 09/23/2019   Urinalysis    Component Value Date/Time   COLORURINE YELLOW 09/21/2019 2119   APPEARANCEUR HAZY (A) 09/21/2019 2119   LABSPEC 1.029 09/21/2019 2119   PHURINE 7.0 09/21/2019 2119   GLUCOSEU 50 (A) 09/21/2019 2119   HGBUR NEGATIVE 09/21/2019 2119   [place] NEGATIVE 09/21/2019 2119   [place] NEGATIVE 09/21/2019 2119   PROTEINUR NEGATIVE 09/21/2019 2119   NITRITE NEGATIVE 09/21/2019 2119   LEUKOCYTESUR NEGATIVE 09/21/2019 2119   Urine Drug Screen     Component Value Date/Time   LABOPIA NONE DETECTED 09/21/2019 2120   COCAINSCRNUR NONE DETECTED 09/21/2019 2120   LABBENZ NONE DETECTED 09/21/2019 2120   AMPHETMU NONE DETECTED 09/21/2019 2120   THCU NONE DETECTED 09/21/2019 2120   LABBARB NONE DETECTED 09/21/2019 2120    Alcohol Level    Component Value Date/Time   ETH <10 09/21/2019 1953    SIGNIFICANT DIAGNOSTIC STUDIES CT Head CODE STROKE  09/21/2019 IMPRESSION: 1. No acute intracranial abnormality. 2. ASPECTS is 10.  CTA Head and Neck 09/21/2019 IMPRESSION: 1. No emergent large vessel occlusion. 2. Moderate right and severe left proximal cavernous segment stenosis of the internal carotid arteries secondary to atherosclerotic calcification. 3. Slightly worsened atherosclerotic stenosis of the proximal right internal carotid artery, measuring 60% by NASCET criteria. 4. Improved patency of left internal carotid artery. 5. At least moderate narrowing of the left vertebral artery origin. 6. Small pleural effusions and diffuse interstitial prominence, compatible with interstitial lung disease. 7. Aortic Atherosclerosis (ICD10-I70.0).  MR BRAIN WO CONTRAST 09/22/2019 IMPRESSION:  Acute cortical infarction in the left parietal lobe. Few other scattered left parietal acute infarctions affecting the cortical and subcortical brain. Small acute infarction affecting the right  posterior frontal subcortical white matter. Punctate acute infarction in the right cerebellum. This distribution of acute infarctions suggests embolic disease from the heart or ascending aorta. No mass effect or hemorrhage. Chronic small-vessel ischemic changes otherwise throughout the cerebral hemispheric white matter.  ECHOCARDIOGRAM COMPLETE 09/22/2019 IMPRESSIONS   1. LV function is depressed with hypokinesis of the inferior, inferolateral walls.     Compared to echo images from previous echo 09/14/19 there is no significant change. Left ventricular ejection fraction, by estimation, is 35 to 40%. The left ventricle has moderately decreased function. Left ventricular diastolic parameters are consistent with Grade I diastolic dysfunction (impaired relaxation).   2. Right ventricular systolic function is normal. The right ventricular size  is normal. There is normal pulmonary artery systolic pressure.   3. The mitral valve is abnormal. Trivial mitral valve regurgitation.   4. The aortic valve is abnormal. Aortic valve regurgitation is mild. Mild aortic valve sclerosis is present, with no evidence of aortic valve stenosis.   5. The inferior vena cava is normal in size with greater than 50% respiratory variability, suggesting right atrial pressure of 3 mmHg.      HISTORY OF PRESENT ILLNESS Earl Mueller is a 84 y.o. male with past medical history significant for prior stroke, carotid artery stenosis, coronary artery disease status post recent PCI on 09/15/19 presents as a code stroke to the emergency department sudden onset aphasia noted by his wife at 6:30 PM on 7/7/2.21. He was last known well at 6:30 PM, patient was with his wife when he abruptly started having difficulty getting words out.  EMS was called and patient had to have right facial droop as well as right-sided weakness.  Blood pressure was 017 systolic.  On arrival to [facility] was taken to obtain CT head.  NIH stroke scale was 6  mainly for aphasia and unable to answer questions.  CT head no showed no hemorrhage patient received IV TPA. Approach for PCI was through radial artery and therefore a compressible site. NIHSS: 6. MRS: 0. He was admitted to the neuro ICU for post tPA care.   HOSPITAL COURSE Earl Mueller is a 84 y.o. male with history of prior stroke, carotid artery stenosis, coronary artery disease status post LAD drug eluting stent 09/14/2019 and Cfx drug eluting stent 09/15/2019 presenting with sudden onset expressive aphasia, R facial droop and R hemiparesis. Received IV tPA 09/21/2019 at 2007.   Stroke:   Multifocal cerebral infarct s/p tPA, embolic pattern, source unclear  Code Stroke CT head No acute abnormality. ASPECTS 10.     CTA head & neck no LVO.  Right petrous ICA, bilateral ICA siphon high-grade stenosis, proximal R ICA 60%, moderate narrowing L VA origin.   MRI - Acute cortical infarction in the left parietal lobe. Few other scattered left parietal acute infarctions affecting the cortical and subcortical brain. Small acute infarction affecting the right poster frontal subcortical white matter. Punctate acute infarction in the right cerebellum. Chronic small-vessel ischemic changes otherwise throughout the cerebral hemispheric white matter.  2D Echo 07/2019 EF 39%. Severe LVH. This admission EF 35 to 40%.    LE venous doppler no DVT  Loop recorder planned Monday before discharge  LDL - 63  HgbA1c - 5.6   aspirin 81 mg daily, clopidogrel 75 mg daily prior to admission, now on aspirin 81 mg daily and Plavix 75 mg daily. Continue on discharge given recent PCI.  Therapy recommendations:  HH PT, no OT or SLP  Disposition:  return home  R radial hematoma s/p tPA  Site of recent cardiac cath PCI 09/15/2019  20 mins compression w/ dsg placed  Site sore but stable    CAD   S/P LAD drug eluting stent 09/14/2019 and Cfx drug eluting stent 09/15/2019  Follow with cardiologist Dr.  Geraldo Pitter  On DAPT for at least 1 year, continue on discharge  Cardiomyopathy  TTE 07/2019 EF 39%  TTE showed EF 35 to 40% this admission  On DAPT  Follow-up with Dr. 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  Aortic Atherosclerosis (ICD10-I70.0)   DISCHARGE EXAM Vitals:   09/25/19 1000 09/25/19 1100 09/25/19 1200 09/25/19 1300  BP: 120/60  126/60   Pulse: 63 (!) 58 66 66  Resp: 17 14 15 15   Temp:   (!) 97.4 F (36.3 C)   TempSrc:   Axillary   SpO2: 93% 97% 94% 93%  Weight:       Pleasant elderly male not in distress. . Afebrile. Head is nontraumatic. Neck is supple without bruit.    Cardiac exam no murmur or gallop. Lungs are clear to auscultation. Distal pulses are well felt.  He has a bandage over her right radial wrist post TPA bleeding.  There are also bruises in the right antecubital and left forearm. Neurological Exam ;  Awake  Alert oriented x 3.  Normal speech, no aphasia, good comprehension, naming and repetition.  Visual field full. Eye movements full without nystagmus or gaze palsy.  Fundi were not visualized. Hearing is normal. Palatal movements are normal. Face symmetric. Tongue midline. Normal strength, tone, reflexes and coordination. Normal sensation. Gait deferred.  DISCHARGE DIET  Heart healthy thin liquids  DISCHARGE PLAN  Disposition:  Return home  aspirin 81 mg daily and clopidogrel 75 mg daily for secondary stroke prevention in setting of recent PCI w/ plans for DAPT x 1 yr  Ongoing risk factor control by Primary Care Physician at time of discharge  Follow-up Rochel Brome, MD as scheduled w/ RN 8/2 at [place] and MD 8/5 at [place] in [facility] in 4 weeks, office to schedule an appointment.  Follow-up Dr. Geraldo Pitter 10/7 at [place] for implantable loop recorder wound check in 10-14 days, office to schedule an appointment.  Loop recorder to be monitored by [facility] for atrial fibrillation as source of stroke. If found, they will notify patient.  35 minutes were spent preparing discharge.  Rosalin Hawking, MD PhD Stroke Neurology 09/25/2019 3:56 PM

## 2019-09-25 ENCOUNTER — Encounter (HOSPITAL_COMMUNITY)
Admission: EM | Disposition: A | Discharge status: Home Health | Payer: Self-pay | Source: Home / Self Care | Attending: Neurology

## 2019-09-25 ENCOUNTER — Encounter: Payer: Self-pay | Admitting: Legal Medicine

## 2019-09-25 ENCOUNTER — Inpatient Hospital Stay: Payer: Medicare HMO | Admitting: Legal Medicine

## 2019-09-25 DIAGNOSIS — I255 Ischemic cardiomyopathy: Secondary | ICD-10-CM | POA: Diagnosis present

## 2019-09-25 DIAGNOSIS — E785 Hyperlipidemia, unspecified: Secondary | ICD-10-CM

## 2019-09-25 DIAGNOSIS — E039 Hypothyroidism, unspecified: Secondary | ICD-10-CM | POA: Diagnosis present

## 2019-09-25 DIAGNOSIS — I69319 Unspecified symptoms and signs involving cognitive functions following cerebral infarction: Secondary | ICD-10-CM

## 2019-09-25 DIAGNOSIS — I69359 Hemiplegia and hemiparesis following cerebral infarction affecting unspecified side: Secondary | ICD-10-CM | POA: Insufficient documentation

## 2019-09-25 DIAGNOSIS — I700 Atherosclerosis of aorta: Secondary | ICD-10-CM

## 2019-09-25 DIAGNOSIS — I739 Peripheral vascular disease, unspecified: Secondary | ICD-10-CM

## 2019-09-25 DIAGNOSIS — I6340 Cerebral infarction due to embolism of unspecified cerebral artery: Secondary | ICD-10-CM | POA: Diagnosis present

## 2019-09-25 DIAGNOSIS — F32A Depression, unspecified: Secondary | ICD-10-CM | POA: Diagnosis present

## 2019-09-25 DIAGNOSIS — I10 Essential (primary) hypertension: Secondary | ICD-10-CM

## 2019-09-25 DIAGNOSIS — J439 Emphysema, unspecified: Secondary | ICD-10-CM

## 2019-09-25 DIAGNOSIS — I63419 Cerebral infarction due to embolism of unspecified middle cerebral artery: Secondary | ICD-10-CM

## 2019-09-25 DIAGNOSIS — I6523 Occlusion and stenosis of bilateral carotid arteries: Secondary | ICD-10-CM

## 2019-09-25 DIAGNOSIS — E876 Hypokalemia: Secondary | ICD-10-CM

## 2019-09-25 DIAGNOSIS — I69328 Other speech and language deficits following cerebral infarction: Secondary | ICD-10-CM | POA: Insufficient documentation

## 2019-09-25 HISTORY — DX: Atherosclerosis of aorta: I70.0

## 2019-09-25 HISTORY — PX: LOOP RECORDER INSERTION: EP1214

## 2019-09-25 HISTORY — DX: Hemiplegia and hemiparesis following cerebral infarction affecting unspecified side: I69.319

## 2019-09-25 HISTORY — DX: Emphysema, unspecified: J43.9

## 2019-09-25 HISTORY — DX: Depression, unspecified: F32.A

## 2019-09-25 HISTORY — DX: Cerebral infarction due to embolism of unspecified cerebral artery: I63.40

## 2019-09-25 HISTORY — DX: Hypokalemia: E87.6

## 2019-09-25 HISTORY — DX: Ischemic cardiomyopathy: I25.5

## 2019-09-25 LAB — BASIC METABOLIC PANEL
Anion gap: 8 (ref 5–15)
BUN: 16 mg/dL (ref 8–23)
CO2: 25 mmol/L (ref 22–32)
Calcium: 8.7 mg/dL — ABNORMAL LOW (ref 8.9–10.3)
Chloride: 104 mmol/L (ref 98–111)
Creatinine, Ser: 1.11 mg/dL (ref 0.61–1.24)
GFR calc Af Amer: >60 mL/min (ref >60)
GFR calc non Af Amer: >60 mL/min (ref >60)
Glucose, Bld: 95 mg/dL (ref 70–99)
Potassium: 3.6 mmol/L (ref 3.5–5.1)
Sodium: 137 mmol/L (ref 135–145)

## 2019-09-25 LAB — CBC
HCT: 40.2 % (ref 39.0–52.0)
Hemoglobin: 13.2 g/dL (ref 13.0–17.0)
MCH: 33.2 pg (ref 26.0–34.0)
MCHC: 32.8 g/dL (ref 30.0–36.0)
MCV: 101 fL — ABNORMAL HIGH (ref 80.0–100.0)
Platelets: 313 10*3/uL (ref 150–400)
RBC: 3.98 MIL/uL — ABNORMAL LOW (ref 4.22–5.81)
RDW: 14.8 % (ref 11.5–15.5)
WBC: 8.9 10*3/uL (ref 4.0–10.5)
nRBC: 0 % (ref 0.0–0.2)

## 2019-09-25 SURGERY — LOOP RECORDER INSERTION

## 2019-09-25 MED ORDER — LIDOCAINE-EPINEPHRINE 1 %-1:100000 IJ SOLN
INTRAMUSCULAR | Status: DC | PRN
  Administered 2019-09-25: 10 mL

## 2019-09-25 MED ORDER — ACETAMINOPHEN 500 MG PO CAPS: 1000.0000 mg | ORAL_CAPSULE | Freq: Four times a day (QID) | ORAL | 0 refills | Status: AC | PRN

## 2019-09-25 MED ORDER — LIDOCAINE-EPINEPHRINE 1 %-1:100000 IJ SOLN
INTRAMUSCULAR | Status: AC
  Filled 2019-09-25: qty 1

## 2019-09-25 SURGICAL SUPPLY — 2 items
MONITOR REVEAL LINQ II (Prosthesis & Implant Heart) ×3 IMPLANT
PACK LOOP INSERTION (CUSTOM PROCEDURE TRAY) ×3 IMPLANT

## 2019-09-25 NOTE — Consult Note (Addendum)
ELECTROPHYSIOLOGY CONSULT NOTE  Patient ID: Earl Mueller MRN: [id number], DOB/AGE: [birth year]   Admit date: 09/21/2019 Date of Consult: 09/25/2019  Primary Physician: Rochel Brome, MD Primary Cardiologist: Dr. Lennox Pippins Reason for Consultation: Cryptogenic stroke ; recommendations regarding Implantable Loop Recorder requested by Dr. Erlinda Hong  History of Present Illness Earl Mueller was admitted on 09/21/2019 with sudden on set of expressive aphasia, R facial droop and R hemiparesis. Received IV tPA 09/21/2019 at 2007 .   PMHx includes: CAD, COPD, combined CHF, HLD, HTN, stroke, PVD s/p left CEA in 2019  CAD > unstable angina underwent LH/PCI 09/15/2019  Ost Cx to Prox Cx lesion is 80% stenosed.  Post intervention, there is a 0% residual stenosis.  A drug-eluting stent was successfully placed using a SYNERGY XD [id number]. 1. Successful PCI of the ostial/proximal LCx with orbital atherectomy and DES x 1.   Neurology notes:   Multifocal cerebral infarct s/p tPA, embolic pattern, source unclear he has undergone workup for stroke including echocardiogram and carotid dopplers.  The patient has been monitored on telemetry which has demonstrated sinus rhythm with no arrhythmias. Neurology has deferred TEE    Echocardiogram this admission demonstrated  IMPRESSIONS  1. LV function is depressed with hypokinesis of the inferior,  inferolateral walls.   Compared to echo images from previous echo 09/14/19 there is no  significant change. Left ventricular ejection fraction, by estimation, is  35 to 40%. The left ventricle has moderately decreased function. Left  ventricular diastolic parameters are  consistent with Grade I diastolic dysfunction (impaired relaxation).  2. Right ventricular systolic function is normal. The right ventricular  size is normal. There is normal pulmonary artery systolic pressure.  3. The mitral valve is abnormal. Trivial mitral valve regurgitation.  4. The aortic  valve is abnormal. Aortic valve regurgitation is mild. Mild  aortic valve sclerosis is present, with no evidence of aortic valve  stenosis.  5. The inferior vena cava is normal in size with greater than 50%  respiratory variability, suggesting right atrial pressure of 3 mmHg.    09/14/2019: TTE IMPRESSIONS  1. Left ventricular ejection fraction by 3D volume is 39 %. The left  ventricle has moderately decreased function. The left ventricle  demonstrates global hypokinesis. There is severe left ventricular  hypertrophy. Indeterminate diastolic filling due to  E-A fusion. Lateral LV wall appears severely hypokinetic.  2. Right ventricular systolic function is normal. The right ventricular  size is normal.  3. The mitral valve is degenerative. Mild mitral valve regurgitation. No  evidence of mitral stenosis.  4. The aortic valve is abnormal. Aortic valve regurgitation is mild. No  aortic stenosis is present.    2018 LVEF by stress test was 58%   Lab work is reviewed.   Prior to admission, the patient denies chest pain, shortness of breath, dizziness, palpitations, or syncope.  They are recovering from their stroke with plans to home at discharge.      Past Medical History:  Diagnosis Date  . Anxiety   . Arthritis   . Cancer ([place])    skin  . Carotid artery stenosis 03/26/2017  . Coronary artery disease involving native coronary artery of native heart without angina pectoris 11/20/2014  . Dyslipidemia 11/20/2014  . Essential hypertension 11/20/2014   Dr Geraldo Pitter, [facility]  . Hypothyroidism   . Peripheral vascular disease ([place])   . Skin ulcer of scalp ([place]) 05/10/2013  . Symptomatic carotid artery stenosis 03/05/2017     Surgical History:  Past Surgical History:  Procedure Laterality Date  . BACK SURGERY    . CARDIAC CATHETERIZATION  03/06/10   NL-LM, 40%pLAD, 30% mid/distal LAD, 98% ostial DIAG (small), 30%pCx, 50%mCx, 30%dCx, 30% OM2, 40%dRCA; Med RX rec ([facility])  .  caritid endart    . CORONARY ATHERECTOMY N/A 09/14/2019   Procedure: CORONARY ATHERECTOMY;  Surgeon: Martinique, Peter M, MD;  Location: [facility];  Service: Cardiovascular;  Laterality: N/A;  . CORONARY ATHERECTOMY N/A 09/15/2019   Procedure: CORONARY ATHERECTOMY;  Surgeon: Martinique, Peter M, MD;  Location: [facility];  Service: Cardiovascular;  Laterality: N/A;  . CORONARY STENT INTERVENTION N/A 09/14/2019   Procedure: CORONARY STENT INTERVENTION;  Surgeon: Martinique, Peter M, MD;  Location: [facility];  Service: Cardiovascular;  Laterality: N/A;  . CORONARY STENT INTERVENTION N/A 09/15/2019   Procedure: CORONARY STENT INTERVENTION;  Surgeon: Martinique, Peter M, MD;  Location: [facility];  Service: Cardiovascular;  Laterality: N/A;  . ENDARTERECTOMY Left 03/26/2017   Procedure: ENDARTERECTOMY CAROTID LEFT;  Surgeon: Rosetta Posner, MD;  Location: [facility];  Service: Vascular;  Laterality: Left;  . femerao bypass    . LEFT HEART CATH AND CORONARY ANGIOGRAPHY N/A 02/03/2017   Procedure: LEFT HEART CATH AND CORONARY ANGIOGRAPHY;  Surgeon: Martinique, Peter M, MD;  Location: [facility];  Service: Cardiovascular;  Laterality: N/A;  . LEFT HEART CATH AND CORONARY ANGIOGRAPHY N/A 09/14/2019   Procedure: LEFT HEART CATH AND CORONARY ANGIOGRAPHY;  Surgeon: Martinique, Peter M, MD;  Location: [place] [facility];  Service: Cardiovascular;  Laterality: N/A;  . PATCH ANGIOPLASTY Left 03/26/2017   Procedure: PATCH ANGIOPLASTY LEFT CAROTID ARTERY;  Surgeon: Rosetta Posner, MD;  Location: [facility];  Service: Vascular;  Laterality: Left;  . SCALP LACERATION REPAIR N/A 05/23/2013   Procedure: EXCISION SCALP ULCER DEBRIDEMENT OF SKIN AND BONE WITH PLACEMENT OF A-CELL;  Surgeon: Irene Limbo, MD;  Location: [facility];  Service: Plastics;  Laterality: N/A;  . STOMACH SURGERY     x2  . VASCULAR SURGERY     AFBG 10/15/05     Medications Prior to Admission  Medication Sig Dispense Refill Last Dose  .  Acetaminophen 500 MG coapsule Take 1,000 mg by mouth every 4 (four) hours as needed for fever or pain.    unknown  . aspirin EC 81 MG tablet Take 81 mg by mouth daily.   09/20/2019 at Unknown time  . atorvastatin (LIPITOR) 80 MG tablet TAKE 1 TABLET EVERY DAY (Patient taking differently: Take 80 mg by mouth daily. ) 90 tablet 0 09/20/2019 at Unknown time  . cilostazol (PLETAL) 50 MG tablet Take 50 mg by mouth 2 (two) times daily.   09/21/2019 at Unknown time  . clopidogrel (PLAVIX) 75 MG tablet TAKE 1 TABLET EVERY DAY (Patient taking differently: Take 75 mg by mouth daily. ) 90 tablet 3 09/21/2019 at Unknown time  . fenofibrate 160 MG tablet Take 160 mg by mouth daily.   09/20/2019 at Unknown time  . levothyroxine (SYNTHROID) 112 MCG tablet Take 1 tablet (112 mcg total) by mouth daily. 90 tablet 0 09/21/2019 at Unknown time  . losartan (COZAAR) 25 MG tablet Take 1 tablet (25 mg total) by mouth daily. 30 tablet 4 09/21/2019 at Unknown time  . metoprolol succinate (TOPROL-XL) 25 MG 24 hr tablet Take 1 tablet (25 mg total) by mouth daily. 30 tablet 4 09/21/2019 at 8am  . Multiple Vitamins-Minerals (CENTRUM SILVER PO) Take 1 tablet by  mouth daily.   09/21/2019 at Unknown time  . nitroGLYCERIN (NITROSTAT) 0.4 MG SL tablet Place 0.4 mg under the tongue every 5 (five) minutes as needed for chest pain.   unknown  . PARoxetine (PAXIL) 20 MG tablet Take 20 mg by mouth daily.   09/21/2019 at Unknown time    Inpatient Medications:  . aspirin EC  81 mg Oral Daily  . atorvastatin  80 mg Oral Daily  . Chlorhexidine Gluconate Cloth  6 each Topical Daily  . clopidogrel  75 mg Oral Daily  . fenofibrate  160 mg Oral Daily  . levothyroxine  112 mcg Oral QAC breakfast  . metoprolol succinate  25 mg Oral Daily  . pantoprazole  40 mg Oral QHS  . PARoxetine  20 mg Oral Daily    Allergies:  Allergies  Allergen Reactions  . Other Other (See Comments)    Any narcotic makes him a "wild man"  Delusions (intolerance)    Social  History   Socioeconomic History  . Marital status: Married    Spouse name: Not on file  . Number of children: Not on file  . Years of education: Not on file  . Highest education level: Not on file  Occupational History  . Not on file  Tobacco Use  . Smoking status: Former Smoker    Quit date: 12/18/2007    Years since quitting: 11.7  . Smokeless tobacco: Never Used  Vaping Use  . Vaping Use: Never used  Substance and Sexual Activity  . Alcohol use: Yes    Comment: occ  . Drug use: No  . Sexual activity: Not on file  Other Topics Concern  . Not on file  Social History Narrative  . Not on file   Social Determinants of Health   Financial Resource Strain:   . Difficulty of Paying Living Expenses:   Food Insecurity: No Food Insecurity  . Worried About Charity fundraiser in the Last Year: Never true  . Ran Out of Food in the Last Year: Never true  Transportation Needs: No Transportation Needs  . Lack of Transportation (Medical): No  . Lack of Transportation (Non-Medical): No  Physical Activity: Inactive  . Days of Exercise per Week: 0 days  . Minutes of Exercise per Session: 0 min  Stress:   . Feeling of Stress :   Social Connections:   . Frequency of Communication with Friends and Family:   . Frequency of Social Gatherings with Friends and Family:   . Attends Religious Services:   . Active Member of Clubs or Organizations:   . Attends Archivist Meetings:   Marland Kitchen Marital Status:   Intimate Partner Violence:   . Fear of Current or Ex-Partner:   . Emotionally Abused:   Marland Kitchen Physically Abused:   . Sexually Abused:      Family History  Problem Relation Age of Onset  . Heart failure Father       Review of Systems: All other systems reviewed and are otherwise negative except as noted above.  Physical Exam: Vitals:   09/25/19 0501 09/25/19 0600 09/25/19 0700 09/25/19 0800  BP:  (!) 125/59 132/78 126/69  Pulse:  (!) 58 (!) 57 (!) 58  Resp:  10 12 (!) 9    Temp:    (!) 97.5 F (36.4 C)  TempSrc:    Axillary  SpO2: 96% 100% 95% 100%  Weight:        GEN- The patient is well appearing, alert  and oriented x 3 today.   Head- normocephalic, atraumatic Eyes-  Sclera clear, conjunctiva pink Ears- hearing intact Oropharynx- clear Neck- supple Lungs- CTA b/ly, normal work of breathing Heart- RRR, no murmurs, rubs or gallops  GI- soft, NT, ND Extremities- no clubbing, cyanosis, or edema MS- no significant deformity or atrophy Skin- no rash or lesion Psych- euthymic mood, full affect   Labs:   Lab Results  Component Value Date   WBC 8.9 09/25/2019   HGB 13.2 09/25/2019   HCT 40.2 09/25/2019   MCV 101.0 (H) 09/25/2019   PLT 313 09/25/2019    Recent Labs  Lab 09/21/19 1953 09/23/19 0257 09/25/19 0443  NA 138  137   < > 137  K 3.6  3.6   < > 3.6  CL 106  104   < > 104  CO2 19*   < > 25  BUN 15  16   < > 16  CREATININE 1.63*  1.60*   < > 1.11  CALCIUM 9.4   < > 8.7*  PROT 7.1  --   --   BILITOT 1.1  --   --   ALKPHOS 75  --   --   ALT 19  --   --   AST 33  --   --   GLUCOSE 154*  152*   < > 95   < > = values in this interval not displayed.   No results found for: CKTOTAL, CKMB, CKMBINDEX, TROPONINI Lab Results  Component Value Date   CHOL 124 09/23/2019   CHOL 148 07/05/2019   Lab Results  Component Value Date   HDL 51 09/23/2019   HDL 56 07/05/2019   Lab Results  Component Value Date   LDLCALC 63 09/23/2019   LDLCALC 79 07/05/2019   Lab Results  Component Value Date   TRIG 52 09/23/2019   TRIG 67 07/05/2019   Lab Results  Component Value Date   CHOLHDL 2.4 09/23/2019   CHOLHDL 2.6 07/05/2019   No results found for: LDLDIRECT  Lab Results  Component Value Date   DDIMER 7.55 (H) 09/13/2019     Radiology/Studies:    CT ANGIO NECK W OR WO CONTRAST Result Date: 09/21/2019 CLINICAL DATA:  Right-sided weakness and facial droop EXAM: CT ANGIOGRAPHY HEAD AND NECK TECHNIQUE: Multidetector CT imaging  of the head and neck was performed using the standard protocol during bolus administration of intravenous contrast. Multiplanar CT image reconstructions and MIPs were obtained to evaluate the vascular anatomy. Carotid stenosis measurements (when applicable) are obtained utilizing NASCET criteria, using the distal internal carotid diameter as the denominator. CONTRAST:  101mL OMNIPAQUE IOHEXOL 350 MG/ML SOLN COMPARISON:  None. FINDINGS: CTA NECK FINDINGS SKELETON: There is no bony spinal canal stenosis. No lytic or blastic lesion. OTHER NECK: Normal pharynx, larynx and major salivary glands. No cervical lymphadenopathy. Unremarkable thyroid gland. UPPER CHEST: Small pleural effusions and diffuse interstitial prominence. AORTIC ARCH: There is mild calcific atherosclerosis of the aortic arch. There is no aneurysm, dissection or hemodynamically significant stenosis of the visualized portion of the aorta. Conventional 3 vessel aortic branching pattern. The visualized proximal subclavian arteries are widely patent. RIGHT CAROTID SYSTEM: No dissection, occlusion or aneurysm. There is low density atherosclerosis extending into the proximal ICA, resulting in 60% stenosis. This has slightly worsened since the prior study. LEFT CAROTID SYSTEM: There is moderate narrowing of the proximal left common carotid artery. No hemodynamically significant stenosis of the left internal carotid artery. VERTEBRAL ARTERIES: Codominant  configuration. There is at least moderate narrowing of the left vertebral artery origin. There is multifocal atherosclerosis along both V2 segments. CTA HEAD FINDINGS POSTERIOR CIRCULATION: --Vertebral arteries: Atherosclerotic calcification of V4 segments without high-grade stenosis. --Inferior cerebellar arteries: Normal. --Basilar artery: Normal. --Superior cerebellar arteries: Normal. --Posterior cerebral arteries (PCA): Normal. ANTERIOR CIRCULATION: --Intracranial internal carotid arteries: Atherosclerotic  calcification of the internal carotid arteries at the skull base with moderate right and severe left proximal cavernous segment stenosis. --Anterior cerebral arteries (ACA): Normal. Both A1 segments are present. Patent anterior communicating artery (a-comm). --Middle cerebral arteries (MCA): Normal. VENOUS SINUSES: As permitted by contrast timing, patent. ANATOMIC VARIANTS: None Review of the MIP images confirms the above findings. IMPRESSION: 1. No emergent large vessel occlusion. 2. Moderate right and severe left proximal cavernous segment stenosis of the internal carotid arteries secondary to atherosclerotic calcification. 3. Slightly worsened atherosclerotic stenosis of the proximal right internal carotid artery, measuring 60% by NASCET criteria. 4. Improved patency of left internal carotid artery. 5. At least moderate narrowing of the left vertebral artery origin. 6. Small pleural effusions and diffuse interstitial prominence, compatible with interstitial lung disease. 7. Aortic Atherosclerosis (ICD10-I70.0). Electronically Signed   By: Ulyses Jarred M.D.   On: 09/21/2019 20:42    CT Angio Chest PE W/Cm &/Or Wo Cm Result Date: 09/13/2019 CLINICAL DATA:  Chest burning and shortness of breath for 2 weeks, abnormal EKG EXAM: CT ANGIOGRAPHY CHEST WITH CONTRAST TECHNIQUE: Multidetector CT imaging of the chest was performed using the standard protocol during bolus administration of intravenous contrast. Multiplanar CT image reconstructions and MIPs were obtained to evaluate the vascular anatomy. CONTRAST:  47mL OMNIPAQUE IOHEXOL 350 MG/ML SOLN COMPARISON:  08/04/2017, 09/13/2019 FINDINGS: Cardiovascular: This is a technically adequate evaluation of the pulmonary vasculature. No filling defects or pulmonary emboli. The heart is unremarkable without pericardial effusion. There is diffuse atherosclerosis throughout the aorta and coronary vessels. Mediastinum/Nodes: Borderline enlarged mediastinal and hilar lymph  nodes are seen, largest measuring 11 mm in short axis in the precarinal region. These are likely reactive. Thyroid, trachea, and esophagus are grossly unremarkable. Lungs/Pleura: Extensive emphysema is again identified, upper lobe predominant. Since the prior exam, diffuse interlobular septal thickening has developed along with trace bilateral pleural effusions. No airspace disease or pneumothorax. The central airways are patent. Upper Abdomen: No acute abnormality. Musculoskeletal: No acute or destructive bony lesions. Reconstructed images demonstrate no additional findings. Review of the MIP images confirms the above findings. IMPRESSION: 1. No evidence of pulmonary embolus. 2. Interstitial edema and trace bilateral pleural effusions, superimposed upon severe emphysema. 3. Aortic Atherosclerosis (ICD10-I70.0) and Emphysema (ICD10-J43.9). Electronically Signed   By: Randa Ngo M.D.   On: 09/13/2019 23:03    MR BRAIN WO CONTRAST Result Date: 09/22/2019 CLINICAL DATA:  Right facial droop.  Right-sided weakness. EXAM: MRI HEAD WITHOUT CONTRAST TECHNIQUE: Multiplanar, multiecho pulse sequences of the brain and surrounding structures were obtained without intravenous contrast. COMPARISON:  Head CT and CT angiography studies done yesterday. FINDINGS: Brain: There is a punctate acute infarction in the right cerebellum. There is acute cortical and subcortical infarction in the left parietal lobe. Punctate acute infarction at the right posterior frontal vertex in the subcortical white matter. This distribution of acute infarctions suggests embolic disease from the heart or ascending aorta. Otherwise, no focal abnormality affects the brainstem or cerebellum. Cerebral hemispheres show chronic small-vessel ischemic changes of the deep and subcortical white matter. No mass lesion, hemorrhage, hydrocephalus or extra-axial collection. Vascular: Major vessels at the base  of the brain show flow. Skull and upper cervical  spine: Negative Sinuses/Orbits: Clear/normal.  Previous lens implant on the left. Other: None IMPRESSION: Acute cortical infarction in the left parietal lobe. Few other scattered left parietal acute infarctions affecting the cortical and subcortical brain. Small acute infarction affecting the right posterior frontal subcortical white matter. Punctate acute infarction in the right cerebellum. This distribution of acute infarctions suggests embolic disease from the heart or ascending aorta. No mass effect or hemorrhage. Chronic small-vessel ischemic changes otherwise throughout the cerebral hemispheric white matter. Electronically Signed   By: Nelson Chimes M.D.   On: 09/22/2019 18:54     CT HEAD CODE STROKE WO CONTRAST Result Date: 09/21/2019 CLINICAL DATA:  Code stroke.  Right facial droop.  LKW 1830 EXAM: CT HEAD WITHOUT CONTRAST TECHNIQUE: Contiguous axial images were obtained from the base of the skull through the vertex without intravenous contrast. COMPARISON:  None. FINDINGS: Brain: There is no mass, hemorrhage or extra-axial collection. There is generalized atrophy without lobar predilection. There is hypoattenuation of the periventricular white matter, most commonly indicating chronic ischemic microangiopathy. Vascular: Atherosclerotic calcification of the vertebral and internal carotid arteries at the skull base. No abnormal hyperdensity of the major intracranial arteries or dural venous sinuses. Skull: The visualized skull base, calvarium and extracranial soft tissues are normal. Sinuses/Orbits: No fluid levels or advanced mucosal thickening of the visualized paranasal sinuses. No mastoid or middle ear effusion. The orbits are normal. ASPECTS [facility] Stroke Program Early CT Score) - Ganglionic level infarction (caudate, lentiform nuclei, internal capsule, insula, M1-M3 cortex): 7 - Supraganglionic infarction (M4-M6 cortex): 3 Total score (0-10 with 10 being normal): 10 IMPRESSION: 1. No acute  intracranial abnormality. 2. ASPECTS is 10. These results were communicated to Dr. Karena Addison Aroor at 8:03 pm on 09/21/2019 by text page via the [facility] messaging system. Electronically Signed   By: Ulyses Jarred M.D.   On: 09/21/2019 20:03     VAS [place] LOWER EXTREMITY VENOUS (DVT) Result Date: 09/23/2019  Lower Venous DVTStudy Indications: Stroke.  Limitations: Poor ultrasound/tissue interface. Comparison Study: No prior study Performing Technologist: Maudry Mayhew MHA, RDMS, RVT, RDCS  Examination Guidelines: A complete evaluation includes B-mode imaging, spectral Doppler, color Doppler, and power Doppler as needed of all accessible portions of each vessel. Bilateral testing is considered an integral part of a complete examination. Limited examinations for reoccurring indications may be performed as noted. The reflux portion of the exam is performed with the patient in reverse Trendelenburg.   Summary: RIGHT: - There is no evidence of deep vein thrombosis in the lower extremity.  - No cystic structure found in the popliteal fossa.  LEFT: - There is no evidence of deep vein thrombosis in the lower extremity.  - No cystic structure found in the popliteal fossa.  *See table(s) above for measurements and observations. Electronically signed by Harold Barban MD on 09/23/2019 at 6:01:48 PM.    Final      12-lead ECG SR, infrequent PVC, rare couplet one or two 3beat NSVT All prior EKG's in EPIC reviewed with no documented atrial fibrillation  Telemetry SR  Assessment and Plan:  1. Cryptogenic stroke The patient presents with cryptogenic stroke.  Neurology has deferred TEE.  I spoke at length with the patient and his wife about monitoring for afib with either a 30 day event monitor or an implantable loop recorder.  Risks, benefits, and alteratives to implantable loop recorder were discussed with the patient today.   At this time, the  patient is very clear in their decision to proceed with implantable loop  recorder.   Suspect/hope his LVEF will improved post recent coronary intervention.  Continue BB/ARB as able post CVA and recommend ongoing cardiology follow up.   Would think post cath CVA complication would not be so far out from his procedure,  Will plan for loop.  Wound care was reviewed with the patient (keep incision clean and dry for 3 days).  Wound check will be scheduled for the patient  Please call with questions.   Baldwin Jamaica, PA-C 09/25/2019  Cryptogenic stroke s/p TPA  Coronary artery disease with prior stenting 7/21  Ischemic cardiomyopathy EF 35-40%   The patient had a stroke interrupted by TPA occurring day 6 following PCI.  It is probably not related to the PCI based on A-data sets use 36 hours for periprocedural stroke, B-symptoms and signs of largely resolved with TPA suggesting a clot embolism as opposed to a atherosclerotic debris  Hence, reasonable to proceed with loop recorder implantation.  I reviewed the risks and the benefits and the presumption related to finding atrial fibrillation and the benefits of anticoagulation.  Also need to follow-up LV function over time as regards to other medications.

## 2019-09-25 NOTE — Progress Notes (Addendum)
Physical Therapy Treatment Patient Details Name: Earl Mueller MRN: [id number] DOB: July 03, 1935 Today's Date: 09/25/2019    History of Present Illness 84 y.o. male with past medical history significant for prior stroke, blind rt eye, carotid artery stenosis, coronary artery disease status post recent PCI to LAD on 09/15/19 presents as a code stroke to the emergency department sudden onset aphasia and R weakness; Pt received IV tPA. rt radial hematoma (site of PCA 7/1); MRI brain acute left parietal infarctions (scattered cortical and subcortical); rt posterior frontal infact; punctate infact rt cerebellum;     PT Comments    Patient progressing well towards PT goals. Reports some stiffness in neck and LEs from being in bed, otherwise reports feeling close to baseline. Improved ambulation distance and tolerated higher level balance challenges- head turns, direction changes, walking backwards and stepping over objects- with only mild deviations in gait but no overt LOB. Reviewed BeFAST and signs/symptoms of stroke with good recall. Eager to have loop recorder placed and to d/c home. Will continue to follow for higher level balance.     Follow Up Recommendations  Home health PT;Supervision - Intermittent     Equipment Recommendations  None recommended by PT    Recommendations for Other Services       Precautions / Restrictions Precautions Precautions: Fall Restrictions Weight Bearing Restrictions: No    Mobility  Bed Mobility Overal bed mobility: Modified Independent Bed Mobility: Supine to Sit     Supine to sit: Modified independent (Device/Increase time);HOB elevated     General bed mobility comments: No assist needed.  Transfers Overall transfer level: Needs assistance Equipment used: None Transfers: Sit to/from Stand Sit to Stand: Supervision         General transfer comment: supervision for safety due to lines. Reports feeling  stiff.  Ambulation/Gait Ambulation/Gait assistance: Supervision;Min guard Gait Distance (Feet): 450 Feet Assistive device: None Gait Pattern/deviations: Step-through pattern;Decreased stride length;Wide base of support;Shuffle Gait velocity: able to fluctuate. Gait velocity interpretation: 1.31 - 2.62 ft/sec, indicative of limited community ambulator General Gait Details: Slow, short steps with shuffling like pattern; tolerated higher level balance without difficulty. Reports stiffness which resolves.   Stairs             Wheelchair Mobility    Modified Rankin (Stroke Patients Only) Modified Rankin (Stroke Patients Only) Pre-Morbid Rankin Score: No significant disability Modified Rankin: Moderate disability     Balance Overall balance assessment: Needs assistance Sitting-balance support: No upper extremity supported;Feet unsupported Sitting balance-Leahy Scale: Good Sitting balance - Comments: reaching to pull up socks   Standing balance support: During functional activity Standing balance-Leahy Scale: Good               High level balance activites: Backward walking;Direction changes;Turns;Sudden stops;Head turns High Level Balance Comments: Tolerated above with only mild deviations in gait but no overt LOB. Able to step over objects without difficulty.            Cognition Arousal/Alertness: Awake/alert Behavior During Therapy: WFL for tasks assessed/performed Overall Cognitive Status: Within Functional Limits for tasks assessed                                        Exercises      General Comments General comments (skin integrity, edema, etc.): VSS on 3L/min 02 [place].      Pertinent Vitals/Pain Pain Assessment: Faces Faces Pain Scale: Hurts  a little bit Pain Location: neck- reports stiffness and sleeping funny Pain Descriptors / Indicators: Sore;Aching Pain Intervention(s): Monitored during session;Repositioned    Home Living                       Prior Function            PT Goals (current goals can now be found in the care plan section) Progress towards PT goals: Progressing toward goals    Frequency    Min 3X/week      PT Plan Current plan remains appropriate    Co-evaluation              AM-PAC PT "6 Clicks" Mobility   Outcome Measure  Help needed turning from your back to your side while in a flat bed without using bedrails?: None Help needed moving from lying on your back to sitting on the side of a flat bed without using bedrails?: None Help needed moving to and from a bed to a chair (including a wheelchair)?: None Help needed standing up from a chair using your arms (e.g., wheelchair or bedside chair)?: None Help needed to walk in hospital room?: A Little Help needed climbing 3-5 steps with a railing? : A Little 6 Click Score: 22    End of Session Equipment Utilized During Treatment: Gait belt;Oxygen Activity Tolerance: Patient tolerated treatment well (on 3L with Sp02 >92%.) Patient left: in chair;with call bell/phone within reach;with chair alarm set Nurse Communication: Mobility status PT Visit Diagnosis: Muscle weakness (generalized) (M62.81);Unsteadiness on feet (R26.81)     Time: 1694-5038 PT Time Calculation (min) (ACUTE ONLY): 20 min  Charges:  $Neuromuscular Re-education: 8-22 mins                     Marisa Severin, PT, DPT Acute Rehabilitation Services Pager [phone] Office [phone]       Marguarite Arbour A Sabra Heck 09/25/2019, 8:56 AM

## 2019-09-25 NOTE — Discharge Instructions (Signed)
Post implant wound care instructions Keep incision clean and dry for 3 days, no rubbing/washing site directly You can remove outer dressing tomorrow. Leave steri-strips (little pieces of tape) on until seen in the office for wound check appointment. Call the office [phone]) for redness, drainage, swelling, or fever.

## 2019-09-25 NOTE — TOC Transition Note (Signed)
Transition of Care [facility]) - CM/SW Discharge Note   Patient Details  Name: [name] MRN: [id number] Date of Birth: January 23, 1936  Transition of Care [facility]) CM/SW Contact:  Ella Bodo, RN Phone Number: 09/25/2019, 2:40 PM   Clinical Narrative:  84 y.o. male with past medical history significant for prior stroke, blind rt eye, carotid artery stenosis, coronary artery disease status post recent PCI to LAD on 09/15/19 presents as a code stroke to the emergency department sudden onset aphasia and R weakness; Pt received IV tPA. rt radial hematoma (site of PCA 7/1); MRI brain acute left parietal infarctions (scattered cortical and subcortical); rt posterior frontal infact; punctate infact rt cerebellum.   PTA, pt independent with assistive devices; lives at home with spouse.  PT recommending [place] follow up, and pt agreeable to services.  Referral to [facility] (pt's second choice; [facility] was first choice, but unable to take due to staffing issues).  No DME needs, per pt or therapies.  Pt is on oxygen at home, provided by [employer]; portable tank at bedside for transport home.      Final next level of care: [place] Barriers to Discharge: Barriers Resolved   Patient Goals and CMS Choice Patient states their goals for this hospitalization and ongoing recovery are:: to get back home CMS Medicare.gov Compare Post Acute Care list provided to:: Patient Choice offered to / list presented to : Patient                        Discharge Plan and Services   Discharge Planning Services: CM Consult Post Acute Care Choice: Home Health                    HH Arranged: PT [place]: [place] Date [place]: 09/25/19 Time [place]: [id number] Representative spoke with at [place]: [place] ([place]) Interventions     Readmission Risk Interventions Readmission Risk Prevention Plan 09/25/2019   Transportation Screening Complete  PCP or Specialist Appt within 5-7 Days Complete  Home Care Screening Complete  Medication Review (RN CM) Complete  Some recent data might be hidden   Reinaldo Raddle, RN, BSN  Trauma/Neuro ICU Case Manager [phone]

## 2019-09-26 ENCOUNTER — Encounter (HOSPITAL_COMMUNITY): Payer: Self-pay | Admitting: Internal Medicine

## 2019-09-30 DIAGNOSIS — I69328 Other speech and language deficits following cerebral infarction: Secondary | ICD-10-CM | POA: Diagnosis not present

## 2019-09-30 DIAGNOSIS — N179 Acute kidney failure, unspecified: Secondary | ICD-10-CM | POA: Diagnosis not present

## 2019-09-30 DIAGNOSIS — S60211D Contusion of right wrist, subsequent encounter: Secondary | ICD-10-CM | POA: Diagnosis not present

## 2019-09-30 DIAGNOSIS — I69351 Hemiplegia and hemiparesis following cerebral infarction affecting right dominant side: Secondary | ICD-10-CM | POA: Diagnosis not present

## 2019-09-30 DIAGNOSIS — I69318 Other symptoms and signs involving cognitive functions following cerebral infarction: Secondary | ICD-10-CM | POA: Diagnosis not present

## 2019-09-30 DIAGNOSIS — H5461 Unqualified visual loss, right eye, normal vision left eye: Secondary | ICD-10-CM | POA: Diagnosis not present

## 2019-09-30 DIAGNOSIS — E876 Hypokalemia: Secondary | ICD-10-CM | POA: Diagnosis not present

## 2019-09-30 DIAGNOSIS — I2510 Atherosclerotic heart disease of native coronary artery without angina pectoris: Secondary | ICD-10-CM | POA: Diagnosis not present

## 2019-09-30 DIAGNOSIS — I69398 Other sequelae of cerebral infarction: Secondary | ICD-10-CM | POA: Diagnosis not present

## 2019-09-30 DIAGNOSIS — I6340 Cerebral infarction due to embolism of unspecified cerebral artery: Secondary | ICD-10-CM | POA: Diagnosis not present

## 2019-10-02 ENCOUNTER — Other Ambulatory Visit: Payer: Self-pay

## 2019-10-02 ENCOUNTER — Ambulatory Visit: Payer: Medicare HMO

## 2019-10-02 ENCOUNTER — Encounter: Payer: Self-pay | Admitting: Family Medicine

## 2019-10-02 ENCOUNTER — Ambulatory Visit (INDEPENDENT_AMBULATORY_CARE_PROVIDER_SITE_OTHER): Payer: Medicare HMO | Admitting: Family Medicine

## 2019-10-02 VITALS — BP 106/68 | HR 68 | Temp 96.8°F | Ht 70.0 in | Wt 142.0 lb

## 2019-10-02 DIAGNOSIS — F329 Major depressive disorder, single episode, unspecified: Secondary | ICD-10-CM

## 2019-10-02 DIAGNOSIS — E785 Hyperlipidemia, unspecified: Secondary | ICD-10-CM

## 2019-10-02 DIAGNOSIS — E039 Hypothyroidism, unspecified: Secondary | ICD-10-CM | POA: Diagnosis not present

## 2019-10-02 DIAGNOSIS — I10 Essential (primary) hypertension: Secondary | ICD-10-CM

## 2019-10-02 DIAGNOSIS — I2510 Atherosclerotic heart disease of native coronary artery without angina pectoris: Secondary | ICD-10-CM | POA: Diagnosis not present

## 2019-10-02 DIAGNOSIS — I259 Chronic ischemic heart disease, unspecified: Secondary | ICD-10-CM | POA: Diagnosis not present

## 2019-10-02 DIAGNOSIS — J439 Emphysema, unspecified: Secondary | ICD-10-CM

## 2019-10-02 DIAGNOSIS — E876 Hypokalemia: Secondary | ICD-10-CM

## 2019-10-02 DIAGNOSIS — F32A Depression, unspecified: Secondary | ICD-10-CM

## 2019-10-02 NOTE — Progress Notes (Signed)
Subjective:  Patient ID: Earl Mueller, male    DOB: Dec 26, 1935  Age: 84 y.o. MRN: [id number] Cc:  CVA-discharged and readmitted HPI  Patient presented to hospital with Hypoxia, Precordial Chest Pain and SOB. Patient was hypoixic at 88%, with exertion had central burning chest pain. SOB and CP were relieved by rest.  Pt has a cough since being home from the hospital his O2 level has been 86-89 at rest w/o his oxygen. Pt using oxygen except when eating.   Patient first seen in the  ER for a stroke pmhx significant for CAD s/p DES placement 7/1, HTN, HLD. Pt returned back to ER after being discharged following his  CVA. Pt was eating dinner with his wife. Wife first noticed aphasia with trouble forming words following by slurring speech and R facial droop. Pt with extremity weakness. EMS endorsed improvement of symptoms during transport. Pt with vessel occlusion on LHC with placement on DES. Patient d/cfrom hospital on 7/03. Head CT initially negative for hemorrhage-pt given TPA. Pt currently denies any motor weakness. Occasionally experiences difficulty with chewing and swallowing. Pt evaluated by PT/OT/ST while hospitalized-HHC-PT only Current Outpatient Medications on File Prior to Visit  Medication Sig Dispense Refill  . Acetaminophen 500 MG capsule Take 2 capsules (1,000 mg total) by mouth every 6 (six) hours as needed for fever or pain. 30 capsule 0  . aspirin EC 81 MG tablet Take 81 mg by mouth daily.    Marland Kitchen atorvastatin (LIPITOR) 80 MG tablet TAKE 1 TABLET EVERY DAY (Patient taking differently: Take 80 mg by mouth daily. ) 90 tablet 0  . clopidogrel (PLAVIX) 75 MG tablet TAKE 1 TABLET EVERY DAY (Patient taking differently: Take 75 mg by mouth daily. ) 90 tablet 3  . fenofibrate 160 MG tablet Take 160 mg by mouth daily.    Marland Kitchen levothyroxine (SYNTHROID) 112 MCG tablet Take 1 tablet (112 mcg total) by mouth daily. 90 tablet 0  . losartan (COZAAR) 25 MG tablet Take 1 tablet (25 mg total) by mouth  daily. 30 tablet 4  . metoprolol succinate (TOPROL-XL) 25 MG 24 hr tablet Take 1 tablet (25 mg total) by mouth daily. 30 tablet 4  . Multiple Vitamins-Minerals (CENTRUM SILVER PO) Take 1 tablet by mouth daily.    . nitroGLYCERIN (NITROSTAT) 0.4 MG SL tablet Place 0.4 mg under the tongue every 5 (five) minutes as needed for chest pain.    Marland Kitchen PARoxetine (PAXIL) 20 MG tablet Take 20 mg by mouth daily.     No current facility-administered medications on file prior to visit.   Past Medical History:  Diagnosis Date  . Anxiety   . Arthritis   . Cancer ([place])    skin  . Carotid artery stenosis 03/26/2017  . Coronary artery disease involving native coronary artery of native heart without angina pectoris 11/20/2014  . Dyslipidemia 11/20/2014  . Essential hypertension 11/20/2014   Dr Geraldo Pitter, [place]  . Hypothyroidism   . Peripheral vascular disease ([place])   . Skin ulcer of scalp ([place]) 05/10/2013  . Symptomatic carotid artery stenosis 03/05/2017   Past Surgical History:  Procedure Laterality Date  . BACK SURGERY    . CARDIAC CATHETERIZATION  03/06/10   NL-LM, 40%pLAD, 30% mid/distal LAD, 98% ostial DIAG (small), 30%pCx, 50%mCx, 30%dCx, 30% OM2, 40%dRCA; Med RX rec ([facility])  . caritid endart    . CORONARY ATHERECTOMY N/A 09/14/2019   Procedure: CORONARY ATHERECTOMY;  Surgeon: Martinique, Peter M, MD;  Location: [place] [facility];  Service: Cardiovascular;  Laterality: N/A;  . CORONARY ATHERECTOMY N/A 09/15/2019   Procedure: CORONARY ATHERECTOMY;  Surgeon: Martinique, Peter M, MD;  Location: [place] [facility];  Service: Cardiovascular;  Laterality: N/A;  . CORONARY STENT INTERVENTION N/A 09/14/2019   Procedure: CORONARY STENT INTERVENTION;  Surgeon: Martinique, Peter M, MD;  Location: [place] [facility];  Service: Cardiovascular;  Laterality: N/A;  . CORONARY STENT INTERVENTION N/A 09/15/2019   Procedure: CORONARY STENT INTERVENTION;  Surgeon: Martinique, Peter M, MD;  Location: [place] [facility];  Service: Cardiovascular;   Laterality: N/A;  . ENDARTERECTOMY Left 03/26/2017   Procedure: ENDARTERECTOMY CAROTID LEFT;  Surgeon: Rosetta Posner, MD;  Location: [facility] OR;  Service: Vascular;  Laterality: Left;  . femerao bypass    . LEFT HEART CATH AND CORONARY ANGIOGRAPHY N/A 02/03/2017   Procedure: LEFT HEART CATH AND CORONARY ANGIOGRAPHY;  Surgeon: Martinique, Peter M, MD;  Location: [place] [facility];  Service: Cardiovascular;  Laterality: N/A;  . LEFT HEART CATH AND CORONARY ANGIOGRAPHY N/A 09/14/2019   Procedure: LEFT HEART CATH AND CORONARY ANGIOGRAPHY;  Surgeon: Martinique, Peter M, MD;  Location: [place] [facility];  Service: Cardiovascular;  Laterality: N/A;  . LOOP RECORDER INSERTION N/A 09/25/2019   Procedure: LOOP RECORDER INSERTION;  Surgeon: Deboraha Sprang, MD;  Location: [place] [facility];  Service: Cardiovascular;  Laterality: N/A;  . PATCH ANGIOPLASTY Left 03/26/2017   Procedure: PATCH ANGIOPLASTY LEFT CAROTID ARTERY;  Surgeon: Rosetta Posner, MD;  Location: [place];  Service: Vascular;  Laterality: Left;  . SCALP LACERATION REPAIR N/A 05/23/2013   Procedure: EXCISION SCALP ULCER DEBRIDEMENT OF SKIN AND BONE WITH PLACEMENT OF A-CELL;  Surgeon: Irene Limbo, MD;  Location: [place];  Service: Plastics;  Laterality: N/A;  . STOMACH SURGERY     x2  . VASCULAR SURGERY     AFBG 10/15/05    Family History  Problem Relation Age of Onset  . Heart failure Father    Social History   Socioeconomic History  . Marital status: Married    Spouse name: Not on file  . Number of children: Not on file  . Years of education: Not on file  . Highest education level: Not on file  Occupational History  . Not on file  Tobacco Use  . Smoking status: Former Smoker    Quit date: 12/18/2007    Years since quitting: 11.7  . Smokeless tobacco: Never Used  Vaping Use  . Vaping Use: Never used  Substance and Sexual Activity  . Alcohol use: Yes    Comment: occ  . Drug use: No  . Sexual activity: Not on file  Other Topics Concern   . Not on file  Social History Narrative  . Not on file   Social Determinants of Health   Financial Resource Strain:   . Difficulty of Paying Living Expenses:   Food Insecurity: No Food Insecurity  . Worried About Charity fundraiser in the Last Year: Never true  . Ran Out of Food in the Last Year: Never true  Transportation Needs: No Transportation Needs  . Lack of Transportation (Medical): No  . Lack of Transportation (Non-Medical): No  Physical Activity: Inactive  . Days of Exercise per Week: 0 days  . Minutes of Exercise per Session: 0 min  Stress:   . Feeling of Stress :   Social Connections:   . Frequency of Communication with Friends and Family:   . Frequency of Social Gatherings with Friends  and Family:   . Attends Religious Services:   . Active Member of Clubs or Organizations:   . Attends Archivist Meetings:   Marland Kitchen Marital Status:     Review of Systems  Constitutional: Negative for chills and fever.  Respiratory: Positive for shortness of breath.   Cardiovascular: Negative for chest pain and leg swelling.  Gastrointestinal: Negative.   Neurological: Positive for facial asymmetry. Negative for dizziness, tremors, speech difficulty, weakness, light-headedness and headaches.  Psychiatric/Behavioral: Negative.      Objective:  There were no vitals taken for this visit.  BP/Weight 09/25/2019 4/0/3474 04/20/9561  Systolic BP 875 - 643  Diastolic BP 60 - 88  Wt. (Lbs) - 147.27 147.6  BMI - 21.13 21.18    Physical Exam Vitals reviewed.  Constitutional:      Appearance: Normal appearance. He is normal weight.  HENT:     Head:      Comments: Facial asymmetry eyebrow, cheek and smile Eyes:     Pupils: Pupils are equal, round, and reactive to light.  Cardiovascular:     Rate and Rhythm: Normal rate. Rhythm irregular.     Pulses: Normal pulses.     Heart sounds: Normal heart sounds.  Pulmonary:     Effort: Pulmonary effort is normal.     Breath  sounds: Normal breath sounds.  Abdominal:     General: Abdomen is flat. Bowel sounds are normal.     Palpations: Abdomen is soft.  Musculoskeletal:        General: Normal range of motion.     Cervical back: Normal range of motion.     Comments: Grip strength equal and bilat  Neurological:     Mental Status: He is alert and oriented to person, place, and time.  Psychiatric:        Mood and Affect: Mood normal.        Behavior: Behavior normal.     Diabetic Foot Exam - Simple   No data filed       Lab Results  Component Value Date   WBC 8.9 09/25/2019   HGB 13.2 09/25/2019   HCT 40.2 09/25/2019   PLT 313 09/25/2019   GLUCOSE 95 09/25/2019   CHOL 124 09/23/2019   TRIG 52 09/23/2019   HDL 51 09/23/2019   LDLCALC 63 09/23/2019   ALT 19 09/21/2019   AST 33 09/21/2019   NA 137 09/25/2019   K 3.6 09/25/2019   CL 104 09/25/2019   CREATININE 1.11 09/25/2019   BUN 16 09/25/2019   CO2 25 09/25/2019   TSH 5.240 (H) 08/22/2019   INR 1.2 09/21/2019   HGBA1C 5.6 09/23/2019      Assessment & Plan:  1. Essential hypertension - Basic Metabolic Panel; Future Pt recently started on medication while hospitalized-low K, checking renal function-metoprolol/losartan 2. Hypokalemia Recheck K - Basic Metabolic Panel 3. Ischemic heart disease Stent placed -h/o CVA  4. Pulmonary emphysema, unspecified emphysema type ([place]) 3L oxygen-pt encouraged to wear oxygen 24 hours -low sat when checked after walking into clinic and while talking 5. Dyslipidemia Started on medication while hospitalized-repeat at 90 days lipitor/fenofibrate 6. Coronary artery disease involving native coronary artery of native heart without angina pectoris D/w pt need for medication-pletal, plavix  7. Depression, unspecified depression type PHQ9-0  8. Hypothyroidism, unspecified type levothyroxine Follow-up: labwork 90 days, bmp today Reviewed hospital records x 2, history from pt and wife, exam, assessment  and plan An After Visit Summary was printed and given  to the patient.  [place] [phone]

## 2019-10-02 NOTE — Chronic Care Management (AMB) (Deleted)
Chronic Care Management Pharmacy  Name: Earl Mueller  MRN: [id number] DOB: 1935/06/18  Chief Complaint/ HPI  Earl Mueller,  84 y.o. , male presents for their Initial CCM visit with the clinical pharmacist via telephone due to COVID-19 Pandemic.  PCP : Rochel Brome, MD  Their chronic conditions include: HTN, Peripheral vascular disease, Carotid artery stenosis, CAD, Hx of stroke, HLD, anxiety, arthritis, hypothyroidism.  Office Visits: 09/13/2019 - SOB and coughing for one month. Gained 10 lbs in last month. Symptoms of chest burning but no nitroglycerin. EKG demonstrated diffuse anterior ischemia - transfer to [facility] cardiac via EMS. Progressive respiratory failure with O2 sat on room air 88%.  03/30/2019 - No change in medications. Mild depression. CBC normal/CMP.  GFR 48. Normal lipid panel.  Consult Visit: 09/21/2019 - 09/25/2019 - ED to hospitalization for stroke. Stop taking cilostazol 50 mg.  Loop recorder placed.  09/20/2019 - Cardiology - no changes. BP stable. Secondary prevention stressed.  09/13/2019- 09/16/2019 - ED to hospital admission. CAD with stent placement. F/U with PCP in 1-2 weeks. BMP/CBC in 1 week. F/U with cardiology. Stopped Potassium due to starting Losartan 25 mg daily and Metoprolol succinate 25 mg daily. Catapres stopped by cardiology. Stop taking clonidine.  07/05/2019 - Dr. Higinio Roger - secondary stroke prevention stressed for diet and medications. 03/23/2019 - Dr. Gilford Rile for eye exam.   Medications: Outpatient Encounter Medications as of 10/02/2019  Medication Sig  . Acetaminophen 500 MG capsule Take 2 capsules (1,000 mg total) by mouth every 6 (six) hours as needed for fever or pain.  Marland Kitchen aspirin EC 81 MG tablet Take 81 mg by mouth daily.  Marland Kitchen atorvastatin (LIPITOR) 80 MG tablet TAKE 1 TABLET EVERY DAY (Patient taking differently: Take 80 mg by mouth daily. )  . clopidogrel (PLAVIX) 75 MG tablet TAKE 1 TABLET EVERY DAY (Patient taking differently: Take 75 mg  by mouth daily. )  . fenofibrate 160 MG tablet Take 160 mg by mouth daily.  Marland Kitchen levothyroxine (SYNTHROID) 112 MCG tablet Take 1 tablet (112 mcg total) by mouth daily.  Marland Kitchen losartan (COZAAR) 25 MG tablet Take 1 tablet (25 mg total) by mouth daily.  . metoprolol succinate (TOPROL-XL) 25 MG 24 hr tablet Take 1 tablet (25 mg total) by mouth daily.  . Multiple Vitamins-Minerals (CENTRUM SILVER PO) Take 1 tablet by mouth daily.  . nitroGLYCERIN (NITROSTAT) 0.4 MG SL tablet Place 0.4 mg under the tongue every 5 (five) minutes as needed for chest pain.  Marland Kitchen PARoxetine (PAXIL) 20 MG tablet Take 20 mg by mouth daily.   No facility-administered encounter medications on file as of 10/02/2019.     Current Diagnosis/Assessment:  Goals Addressed   None    Hyperlipidemia   Lipid Panel     Component Value Date/Time   CHOL 124 09/23/2019 0257   CHOL 148 07/05/2019 1003   TRIG 52 09/23/2019 0257   HDL 51 09/23/2019 0257   HDL 56 07/05/2019 1003   CHOLHDL 2.4 09/23/2019 0257   VLDL 10 09/23/2019 0257   LDLCALC 63 09/23/2019 0257   LDLCALC 79 07/05/2019 1003   LABVLDL 13 07/05/2019 1003     The ASCVD Risk score (Goff DC Jr., et al., 2013) failed to calculate for the following reasons:   The 2013 ASCVD risk score is only valid for ages 66 to 35   The patient has a prior MI or stroke diagnosis   Patient has failed these meds in past: zetia Patient is currently controlled on  the following medications: aspirin ec 81 mg daily, atorvastatin 80 mg daily, fenofibrate 160 mg daily, nitroglycerin 0.4 mg prn, cilostazole 100 mg bid, clopidogrel 75 mg daily  We discussed:  diet and exercise extensively. Patient does not exercise. Wife reports that he eats what she cooks but she has been sick and not cooking. She has started cooking again so is back on track eating healthier.   Plan  Continue current medications   Hypertension   BP today is:  <130/80  Office blood pressures are  BP Readings from Last 3  Encounters:  09/25/19 126/60  09/20/19 (!) 166/88  09/16/19 110/62    Patient has failed these meds in the past: isosorbide dn 30 mg dialy Patient is currently controlled on the following medications: chlorthalidone 25 mg daily, potassium 10 meq bid  Patient checks BP at home infrequently  Patient home BP readings are ranging: in goal per patient  We discussed diet and exercise extensively  Plan  Continue current medications   Hypothyroidism   TSH  Date Value Ref Range Status  08/22/2019 5.240 (H) 0.450 - 4.500 uIU/mL Final     Patient has failed these meds in past: n/a Patient is currently uncontrolled on the following medications: levothyroxine 100 mcg daily  We discussed: Patient will have thyroid levels rechecked in June 2021 to assess new incrased dose.   Plan  Continue current medications     and  Other Diagnosis:Anxiety    Patient has failed these meds in past: n/a Patient is currently controlled on the following medications: paroxetine 20 mg daily  We discussed: Wife indicates that patient is well managed and the same as he always is.   Plan  Continue current medications     Health Maintenance   Patient is currently controlled on the following medications:  Multivitamin daily - general health We discussed:  Patient's wife indicates   Plan  Continue current medications  Vaccines   Reviewed and discussed patient's vaccination history.  Patient's last TDAP was 11/17. Penumovax 23 in 2010.  Patient has had both COVID vaccines. Has had first Shingrix shot and second one will be in July.   Immunization History  Administered Date(s) Administered  . Influenza-Unspecified 12/18/2016  . Pneumococcal Polysaccharide-23 10/25/2008, 09/15/2019  . Tdap 01/15/2016  . Zoster Recombinat (Shingrix) 06/12/2019    Plan  Recommended patient receive annual flu vaccine in office.   Medication Management   Pt uses [facility] mail order pharmacy for all  medications Uses pill box? Yes Pt endorses good compliance - may miss an occasional evening dose.   We discussed: Patient is doing well and wife fills his pill box.   Plan  Continue current medication management strategy    Follow up: 6 month phone visit

## 2019-10-02 NOTE — Patient Instructions (Addendum)
Cut Losartan 25 mg in half. Take 1/2 tablet daily. Check b/p daily in the a.m., 130/80 pulse rate 60-90 is the goal. Bring b/p readings to next office visit. Wear oxygen even while resting to help your body recover. Talk with your Cardiologist about when you can start driving again. Do not drive until you are cleared by your Cardiologist. Apply heat (heating pad) to right wrist.

## 2019-10-03 DIAGNOSIS — I69398 Other sequelae of cerebral infarction: Secondary | ICD-10-CM | POA: Diagnosis not present

## 2019-10-03 DIAGNOSIS — S60211D Contusion of right wrist, subsequent encounter: Secondary | ICD-10-CM | POA: Diagnosis not present

## 2019-10-03 DIAGNOSIS — H5461 Unqualified visual loss, right eye, normal vision left eye: Secondary | ICD-10-CM | POA: Diagnosis not present

## 2019-10-03 DIAGNOSIS — I69318 Other symptoms and signs involving cognitive functions following cerebral infarction: Secondary | ICD-10-CM | POA: Diagnosis not present

## 2019-10-03 DIAGNOSIS — I69351 Hemiplegia and hemiparesis following cerebral infarction affecting right dominant side: Secondary | ICD-10-CM | POA: Diagnosis not present

## 2019-10-03 DIAGNOSIS — I2510 Atherosclerotic heart disease of native coronary artery without angina pectoris: Secondary | ICD-10-CM | POA: Diagnosis not present

## 2019-10-03 DIAGNOSIS — I69328 Other speech and language deficits following cerebral infarction: Secondary | ICD-10-CM | POA: Diagnosis not present

## 2019-10-03 DIAGNOSIS — E876 Hypokalemia: Secondary | ICD-10-CM | POA: Diagnosis not present

## 2019-10-03 DIAGNOSIS — N179 Acute kidney failure, unspecified: Secondary | ICD-10-CM | POA: Diagnosis not present

## 2019-10-03 LAB — BASIC METABOLIC PANEL
BUN/Creatinine Ratio: 14 (ref 10–24)
BUN: 18 mg/dL (ref 8–27)
CO2: 25 mmol/L (ref 20–29)
Calcium: 9.6 mg/dL (ref 8.6–10.2)
Chloride: 106 mmol/L (ref 96–106)
Creatinine, Ser: 1.25 mg/dL (ref 0.76–1.27)
GFR calc Af Amer: 62 mL/min/{1.73_m2} (ref >59)
GFR calc non Af Amer: 53 mL/min/{1.73_m2} — ABNORMAL LOW (ref >59)
Glucose: 73 mg/dL (ref 65–99)
Potassium: 4.2 mmol/L (ref 3.5–5.2)
Sodium: 142 mmol/L (ref 134–144)

## 2019-10-04 DIAGNOSIS — I69398 Other sequelae of cerebral infarction: Secondary | ICD-10-CM | POA: Diagnosis not present

## 2019-10-04 DIAGNOSIS — H5461 Unqualified visual loss, right eye, normal vision left eye: Secondary | ICD-10-CM | POA: Diagnosis not present

## 2019-10-04 DIAGNOSIS — N179 Acute kidney failure, unspecified: Secondary | ICD-10-CM | POA: Diagnosis not present

## 2019-10-04 DIAGNOSIS — I69351 Hemiplegia and hemiparesis following cerebral infarction affecting right dominant side: Secondary | ICD-10-CM | POA: Diagnosis not present

## 2019-10-04 DIAGNOSIS — I2510 Atherosclerotic heart disease of native coronary artery without angina pectoris: Secondary | ICD-10-CM | POA: Diagnosis not present

## 2019-10-04 DIAGNOSIS — S60211D Contusion of right wrist, subsequent encounter: Secondary | ICD-10-CM | POA: Diagnosis not present

## 2019-10-04 DIAGNOSIS — I69328 Other speech and language deficits following cerebral infarction: Secondary | ICD-10-CM | POA: Diagnosis not present

## 2019-10-04 DIAGNOSIS — E876 Hypokalemia: Secondary | ICD-10-CM | POA: Diagnosis not present

## 2019-10-04 DIAGNOSIS — I69318 Other symptoms and signs involving cognitive functions following cerebral infarction: Secondary | ICD-10-CM | POA: Diagnosis not present

## 2019-10-05 ENCOUNTER — Other Ambulatory Visit: Payer: Self-pay

## 2019-10-05 ENCOUNTER — Ambulatory Visit: Payer: Medicare HMO

## 2019-10-05 ENCOUNTER — Ambulatory Visit (INDEPENDENT_AMBULATORY_CARE_PROVIDER_SITE_OTHER): Payer: Medicare HMO | Admitting: Emergency Medicine

## 2019-10-05 DIAGNOSIS — I10 Essential (primary) hypertension: Secondary | ICD-10-CM

## 2019-10-05 DIAGNOSIS — I63419 Cerebral infarction due to embolism of unspecified middle cerebral artery: Secondary | ICD-10-CM

## 2019-10-05 DIAGNOSIS — E785 Hyperlipidemia, unspecified: Secondary | ICD-10-CM

## 2019-10-05 LAB — CUP PACEART INCLINIC DEVICE CHECK
Date Time Interrogation Session: 20210722150012
Implantable Pulse Generator Implant Date: 20210712

## 2019-10-05 NOTE — Chronic Care Management (AMB) (Signed)
Chronic Care Management Pharmacy  Name: Earl Mueller  MRN: [id number] DOB: May 23, 1935  Chief Complaint/ HPI  Earl Mueller,  84 y.o. , male presents for their Follow-Up CCM visit with the clinical pharmacist via telephone due to COVID-19 Pandemic.  PCP : Rochel Brome, MD  Their chronic conditions include: HTN, Peripheral vascular disease, Carotid artery stenosis, CAD, Hx of stroke, HLD, anxiety, arthritis, hypothyroidism.  Office Visits: 10/02/2019 - reduce losartan to 12.5 mg daily. Wear oxygen to allow body to recover.  09/13/2019 - SOB and coughing for one month. Gained 10 lbs in last month. Symptoms of chest burning but no nitroglycerin. EKG demonstrated diffuse anterior ischemia - transfer to [facility] cardiac via EMS. Progressive respiratory failure with O2 sat on room air 88%.  03/30/2019 - No change in medications. Mild depression. CBC normal/CMP.  GFR 48. Normal lipid panel.  Consult Visit: 09/21/2019 - 09/25/2019 - ED to hospitalization for stroke. Stop taking cilostazol 50 mg.  Loop recorder placed.  09/20/2019 - Cardiology - no changes. BP stable. Secondary prevention stressed.  09/13/2019- 09/16/2019 - ED to hospital admission. CAD with stent placement. F/U with PCP in 1-2 weeks. BMP/CBC in 1 week. F/U with cardiology. Stopped Potassium due to starting Losartan 25 mg daily and Metoprolol succinate 25 mg daily. Catapres stopped by cardiology. Stop taking clonidine.  07/05/2019 - Dr. Higinio Roger - secondary stroke prevention stressed for diet and medications. 03/23/2019 - Dr. Gilford Rile for eye exam.   Medications: Outpatient Encounter Medications as of 10/05/2019  Medication Sig  . Acetaminophen 500 MG capsule Take 2 capsules (1,000 mg total) by mouth every 6 (six) hours as needed for fever or pain.  Marland Kitchen aspirin EC 81 MG tablet Take 81 mg by mouth daily.  Marland Kitchen atorvastatin (LIPITOR) 80 MG tablet TAKE 1 TABLET EVERY DAY (Patient taking differently: Take 80 mg by mouth daily. )  .  cilostazol (PLETAL) 100 MG tablet Take 100 mg by mouth 2 (two) times daily.  . clopidogrel (PLAVIX) 75 MG tablet TAKE 1 TABLET EVERY DAY (Patient taking differently: Take 75 mg by mouth daily. )  . fenofibrate 160 MG tablet Take 160 mg by mouth daily.  Marland Kitchen levothyroxine (SYNTHROID) 112 MCG tablet Take 1 tablet (112 mcg total) by mouth daily.  Marland Kitchen losartan (COZAAR) 25 MG tablet Take 1 tablet (25 mg total) by mouth daily. (Patient taking differently: Take 12.5 mg by mouth daily. )  . metoprolol succinate (TOPROL-XL) 25 MG 24 hr tablet Take 1 tablet (25 mg total) by mouth daily.  . Multiple Vitamins-Minerals (CENTRUM SILVER PO) Take 1 tablet by mouth daily.  . nitroGLYCERIN (NITROSTAT) 0.4 MG SL tablet Place 0.4 mg under the tongue every 5 (five) minutes as needed for chest pain.  Marland Kitchen PARoxetine (PAXIL) 20 MG tablet Take 20 mg by mouth daily.   No facility-administered encounter medications on file as of 10/05/2019.     Current Diagnosis/Assessment:  Goals Addressed            This Visit's Progress   . Pharmacy Care Plan       CARE PLAN ENTRY  Current Barriers:  . Chronic Disease Management support, education, and care coordination needs related to Hypertension and Hyperlipidemia   Hypertension . Pharmacist Clinical Goal(s): o Over the next 90 days, patient will work with PharmD and providers to maintain BP goal <130/80 . Current regimen:  o Losartan 12.5 mg daily o Metoprolol succinate 25 mg daily . Interventions: o Recommended patient request pharmacy to cut losartan  in half due to difficulty.  o Recommended patient continue to eat heart-healthy diet.  . Patient self care activities - Over the next 90 days, patient will: o Check BP with symptoms, document, and provide at future appointments o Ensure daily salt intake < 2300 mg/day  Hyperlipidemia . Pharmacist Clinical Goal(s): o Over the next 90 days, patient will work with PharmD and providers to maintain LDL goal < 100 . Current  regimen:  o Atorvastatin 80 mg daily and Fenofibrate 160 mg daily . Interventions: o Recommend patient continue to eat heart healthy diet.  o Continue with physical and occupational therapy.  . Patient self care activities - Over the next 90 days, patient will: o Continue to take medications as prescribed and follow-up with MD as directed.   Medication management . Pharmacist Clinical Goal(s): o Over the next 90 days, patient will work with PharmD and providers to maintain optimal medication adherence . Current pharmacy: [facility] . Interventions o Comprehensive medication review performed. o Continue current medication management strategy . Patient self care activities - Over the next 90 days, patient will: o Focus on medication adherence by continuing to use pill box.  o Take medications as prescribed o Report any questions or concerns to PharmD and/or provider(s)  Please see past updates related to this goal by clicking on the "Past Updates" button in the selected goal        Hyperlipidemia   Lipid Panel     Component Value Date/Time   CHOL 124 09/23/2019 0257   CHOL 148 07/05/2019 1003   TRIG 52 09/23/2019 0257   HDL 51 09/23/2019 0257   HDL 56 07/05/2019 1003   CHOLHDL 2.4 09/23/2019 0257   VLDL 10 09/23/2019 0257   LDLCALC 63 09/23/2019 0257   LDLCALC 79 07/05/2019 1003   LABVLDL 13 07/05/2019 1003     The ASCVD Risk score (Goff DC Jr., et al., 2013) failed to calculate for the following reasons:   The 2013 ASCVD risk score is only valid for ages 42 to 7   The patient has a prior MI or stroke diagnosis   Patient has failed these meds in past: zetia, cilostazole Patient is currently controlled on the following medications:   aspirin ec 81 mg daily  atorvastatin 80 mg daily  fenofibrate 160 mg daily  nitroglycerin 0.4 mg prn  clopidogrel 75 mg daily  We discussed:  diet and exercise extensively. Patient does not exercise. Wife reports that he  eats what she cooks but she has been sick and not cooking. She has started cooking again so is back on track eating healthier.   Update 10/05/2019: Patient is recovering from recent stents and stroke. He is regaining strength with therapy at home and using oxygen. Following-up with Cardiology today. Wife has some questions about medications being resumed that she plans to ask today.   Plan  Continue current medications   Hypertension   BP today is:  <130/80  Office blood pressures are  BP Readings from Last 3 Encounters:  10/02/19 106/68  09/25/19 126/60  09/20/19 (!) 166/88    Patient has failed these meds in the past: isosorbide dn, potassium, chlorthalidone Patient is currently controlled on the following medications:   losartan 12.5 mg daily  Metoprolol succinate 25 mg daily  Patient checks BP at home infrequently  Patient home BP readings are ranging: in goal per patient  We discussed diet and exercise extensively   Update 10/05/2019 - Patient's blood pressure is  good at home when therapy is checking. Blood pressure medication was tweaked in office this week to reduce hypotension. Patient instructed to check bp and bring to next appointment with Dr. Tobie Poet.   Plan  Continue current medications   Hypothyroidism   TSH  Date Value Ref Range Status  08/22/2019 5.240 (H) 0.450 - 4.500 uIU/mL Final     Patient has failed these meds in past: n/a Patient is currently uncontrolled on the following medications: levothyroxine 112 mcg daily  We discussed: Patient will have thyroid levels rechecked in June 2021 to assess new incrased dose.   Update 10/05/2019 - Patient's levothyroxine increased to 112 mcg since TSH is above goal.  Plan  Continue current medications     and  Other Diagnosis:Anxiety    Patient has failed these meds in past: n/a Patient is currently controlled on the following medications: paroxetine 20 mg daily  We discussed: Wife indicates that  patient is well managed and the same as he always is.   Plan  Continue current medications     Health Maintenance   Patient is currently controlled on the following medications:  Multivitamin daily - general health We discussed:  Patient's wife indicates   Plan  Continue current medications  Vaccines   Reviewed and discussed patient's vaccination history.  Patient's last TDAP was 11/17. Penumovax 23 in 2010.  Patient has had both COVID vaccines. Has had first Shingrix shot and second one will be in July.   Immunization History  Administered Date(s) Administered  . Influenza-Unspecified 12/18/2016  . Pneumococcal Polysaccharide-23 10/25/2008, 09/15/2019  . Tdap 01/15/2016  . Zoster Recombinat (Shingrix) 06/12/2019    Plan  Recommended patient receive annual flu vaccine in office.   Medication Management   Pt uses [facility] mail order pharmacy for all medications Uses pill box? Yes Pt endorses good compliance - may miss an occasional evening dose.   We discussed: Patient is doing well and wife fills his pill box.   Plan  Continue current medication management strategy    Follow up: 3 month phone visit

## 2019-10-05 NOTE — Progress Notes (Signed)
ILR wound check in clinic. Steri strips removed. Wound well healed. Home monitor transmitting nightly. Battery status: good. R-waves 1. 40 mV. 0 symptom episodes, 0 tachy episodes, 2 pause episodes (Pt was sleeping), 0 brady episodes. 0 AF episodes (0% burden). Next home remote 11/06/19 Questions answered.

## 2019-10-05 NOTE — Patient Instructions (Addendum)
Visit Information  Goals Addressed            This Visit's Progress   . Pharmacy Care Plan       CARE PLAN ENTRY  Current Barriers:  . Chronic Disease Management support, education, and care coordination needs related to Hypertension and Hyperlipidemia   Hypertension . Pharmacist Clinical Goal(s): o Over the next 90 days, patient will work with PharmD and providers to maintain BP goal <130/80 . Current regimen:  o Losartan 12.5 mg daily o Metoprolol succinate 25 mg daily . Interventions: o Recommended patient request pharmacy to cut losartan in half due to difficulty.  o Recommended patient continue to eat heart-healthy diet.  . Patient self care activities - Over the next 90 days, patient will: o Check BP with symptoms, document, and provide at future appointments o Ensure daily salt intake < 2300 mg/day  Hyperlipidemia . Pharmacist Clinical Goal(s): o Over the next 90 days, patient will work with PharmD and providers to maintain LDL goal < 100 . Current regimen:  o Atorvastatin 80 mg daily and Fenofibrate 160 mg daily . Interventions: o Recommend patient continue to eat heart healthy diet.  o Continue with physical and occupational therapy.  . Patient self care activities - Over the next 90 days, patient will: o Continue to take medications as prescribed and follow-up with MD as directed.   Medication management . Pharmacist Clinical Goal(s): o Over the next 90 days, patient will work with PharmD and providers to maintain optimal medication adherence . Current pharmacy: [facility] . Interventions o Comprehensive medication review performed. o Continue current medication management strategy . Patient self care activities - Over the next 90 days, patient will: o Focus on medication adherence by continuing to use pill box.  o Take medications as prescribed o Report any questions or concerns to PharmD and/or provider(s)  Please see past updates related to this  goal by clicking on the "Past Updates" button in the selected goal         The patient verbalized understanding of instructions provided today and declined a print copy of patient instruction materials.   Telephone follow up appointment with pharmacy team member scheduled for: 12/2019  Sherre Poot, PharmD, [facility] Clinical Pharmacist [facility] [phone] (office) [phone] (mobile)   DASH Eating Plan DASH stands for "Dietary Approaches to Stop Hypertension." The DASH eating plan is a healthy eating plan that has been shown to reduce high blood pressure (hypertension). It may also reduce your risk for type 2 diabetes, heart disease, and stroke. The DASH eating plan may also help with weight loss. What are tips for following this plan?  General guidelines  Avoid eating more than 2,300 mg (milligrams) of salt (sodium) a day. If you have hypertension, you may need to reduce your sodium intake to 1,500 mg a day.  Limit alcohol intake to no more than 1 drink a day for nonpregnant women and 2 drinks a day for men. One drink equals 12 oz of beer, 5 oz of wine, or 1 oz of hard liquor.  Work with your health care provider to maintain a healthy body weight or to lose weight. Ask what an ideal weight is for you.  Get at least 30 minutes of exercise that causes your heart to beat faster (aerobic exercise) most days of the week. Activities may include walking, swimming, or biking.  Work with your health care provider or diet and nutrition specialist (dietitian) to adjust your eating  plan to your individual calorie needs. Reading food labels   Check food labels for the amount of sodium per serving. Choose foods with less than 5 percent of the Daily Value of sodium. Generally, foods with less than 300 mg of sodium per serving fit into this eating plan.  To find whole grains, look for the word "whole" as the first word in the ingredient list. Shopping  Buy products labeled  as "low-sodium" or "no salt added."  Buy fresh foods. Avoid canned foods and premade or frozen meals. Cooking  Avoid adding salt when cooking. Use salt-free seasonings or herbs instead of table salt or sea salt. Check with your health care provider or pharmacist before using salt substitutes.  Do not fry foods. Cook foods using healthy methods such as baking, boiling, grilling, and broiling instead.  Cook with heart-healthy oils, such as olive, canola, soybean, or sunflower oil. Meal planning  Eat a balanced diet that includes: ? 5 or more servings of fruits and vegetables each day. At each meal, try to fill half of your plate with fruits and vegetables. ? Up to 6-8 servings of whole grains each day. ? Less than 6 oz of lean meat, poultry, or fish each day. A 3-oz serving of meat is about the same size as a deck of cards. One egg equals 1 oz. ? 2 servings of low-fat dairy each day. ? A serving of nuts, seeds, or beans 5 times each week. ? Heart-healthy fats. Healthy fats called Omega-3 fatty acids are found in foods such as flaxseeds and coldwater fish, like sardines, salmon, and mackerel.  Limit how much you eat of the following: ? Canned or prepackaged foods. ? Food that is high in trans fat, such as fried foods. ? Food that is high in saturated fat, such as fatty meat. ? Sweets, desserts, sugary drinks, and other foods with added sugar. ? Full-fat dairy products.  Do not salt foods before eating.  Try to eat at least 2 vegetarian meals each week.  Eat more home-cooked food and less restaurant, buffet, and fast food.  When eating at a restaurant, ask that your food be prepared with less salt or no salt, if possible. What foods are recommended? The items listed may not be a complete list. Talk with your dietitian about what dietary choices are best for you. Grains Whole-grain or whole-wheat bread. Whole-grain or whole-wheat pasta. Rally Ouch rice. [place] Morrow. Bulgur. Whole-grain  and low-sodium cereals. Pita bread. Low-fat, low-sodium crackers. Whole-wheat flour tortillas. Vegetables Fresh or frozen vegetables (raw, steamed, roasted, or grilled). Low-sodium or reduced-sodium tomato and vegetable juice. Low-sodium or reduced-sodium tomato sauce and tomato paste. Low-sodium or reduced-sodium canned vegetables. Fruits All fresh, dried, or frozen fruit. Canned fruit in natural juice (without added sugar). Meat and other protein foods Skinless chicken or [place]. Ground chicken or [place]. Pork with fat trimmed off. Fish and seafood. Egg whites. Dried beans, peas, or lentils. Unsalted nuts, nut butters, and seeds. Unsalted canned beans. Lean cuts of beef with fat trimmed off. Low-sodium, lean deli meat. Dairy Low-fat (1%) or fat-free (skim) milk. Fat-free, low-fat, or reduced-fat cheeses. Nonfat, low-sodium ricotta or cottage cheese. Low-fat or nonfat yogurt. Low-fat, low-sodium cheese. Fats and oils Soft margarine without trans fats. Vegetable oil. Low-fat, reduced-fat, or light mayonnaise and salad dressings (reduced-sodium). Canola, safflower, olive, soybean, and sunflower oils. Avocado. Seasoning and other foods Herbs. Spices. Seasoning mixes without salt. Unsalted popcorn and pretzels. Fat-free sweets. What foods are not recommended? The items listed  may not be a complete list. Talk with your dietitian about what dietary choices are best for you. Grains Baked goods made with fat, such as croissants, muffins, or some breads. Dry pasta or rice meal packs. Vegetables Creamed or fried vegetables. Vegetables in a cheese sauce. Regular canned vegetables (not low-sodium or reduced-sodium). Regular canned tomato sauce and paste (not low-sodium or reduced-sodium). Regular tomato and vegetable juice (not low-sodium or reduced-sodium). Angie Fava. Olives. Fruits Canned fruit in a light or heavy syrup. Fried fruit. Fruit in cream or butter sauce. Meat and other protein foods Fatty cuts  of meat. Ribs. Fried meat. Berniece Salines. Sausage. Bologna and other processed lunch meats. Salami. Fatback. Hotdogs. Bratwurst. Salted nuts and seeds. Canned beans with added salt. Canned or smoked fish. Whole eggs or egg yolks. Chicken or [place] with skin. Dairy Whole or 2% milk, cream, and half-and-half. Whole or full-fat cream cheese. Whole-fat or sweetened yogurt. Full-fat cheese. Nondairy creamers. Whipped toppings. Processed cheese and cheese spreads. Fats and oils Butter. Stick margarine. Lard. Shortening. Ghee. Bacon fat. Tropical oils, such as coconut, palm kernel, or palm oil. Seasoning and other foods Salted popcorn and pretzels. Onion salt, garlic salt, seasoned salt, table salt, and sea salt. Worcestershire sauce. Tartar sauce. Barbecue sauce. Teriyaki sauce. Soy sauce, including reduced-sodium. Steak sauce. Canned and packaged gravies. Fish sauce. Oyster sauce. Cocktail sauce. Horseradish that you find on the shelf. Ketchup. Mustard. Meat flavorings and tenderizers. Bouillon cubes. Hot sauce and Tabasco sauce. Premade or packaged marinades. Premade or packaged taco seasonings. Relishes. Regular salad dressings. Where to find more information:  National Heart, Lung, and [place]: https://wilson-eaton.com/  American Heart Association: www.heart.org Summary  The DASH eating plan is a healthy eating plan that has been shown to reduce high blood pressure (hypertension). It may also reduce your risk for type 2 diabetes, heart disease, and stroke.  With the DASH eating plan, you should limit salt (sodium) intake to 2,300 mg a day. If you have hypertension, you may need to reduce your sodium intake to 1,500 mg a day.  When on the DASH eating plan, aim to eat more fresh fruits and vegetables, whole grains, lean proteins, low-fat dairy, and heart-healthy fats.  Work with your health care provider or diet and nutrition specialist (dietitian) to adjust your eating plan to your individual calorie  needs. This information is not intended to replace advice given to you by your health care provider. Make sure you discuss any questions you have with your health care provider. Document Revised: 02/12/2017 Document Reviewed: 02/24/2016 Elsevier Patient Education  2020 [employer].

## 2019-10-06 DIAGNOSIS — I69398 Other sequelae of cerebral infarction: Secondary | ICD-10-CM | POA: Diagnosis not present

## 2019-10-06 DIAGNOSIS — S60211D Contusion of right wrist, subsequent encounter: Secondary | ICD-10-CM | POA: Diagnosis not present

## 2019-10-06 DIAGNOSIS — H5461 Unqualified visual loss, right eye, normal vision left eye: Secondary | ICD-10-CM | POA: Diagnosis not present

## 2019-10-06 DIAGNOSIS — I69318 Other symptoms and signs involving cognitive functions following cerebral infarction: Secondary | ICD-10-CM | POA: Diagnosis not present

## 2019-10-06 DIAGNOSIS — N179 Acute kidney failure, unspecified: Secondary | ICD-10-CM | POA: Diagnosis not present

## 2019-10-06 DIAGNOSIS — I69351 Hemiplegia and hemiparesis following cerebral infarction affecting right dominant side: Secondary | ICD-10-CM | POA: Diagnosis not present

## 2019-10-06 DIAGNOSIS — E876 Hypokalemia: Secondary | ICD-10-CM | POA: Diagnosis not present

## 2019-10-06 DIAGNOSIS — I2510 Atherosclerotic heart disease of native coronary artery without angina pectoris: Secondary | ICD-10-CM | POA: Diagnosis not present

## 2019-10-06 DIAGNOSIS — I69328 Other speech and language deficits following cerebral infarction: Secondary | ICD-10-CM | POA: Diagnosis not present

## 2019-10-07 ENCOUNTER — Other Ambulatory Visit: Payer: Self-pay | Admitting: Family Medicine

## 2019-10-09 DIAGNOSIS — I2510 Atherosclerotic heart disease of native coronary artery without angina pectoris: Secondary | ICD-10-CM | POA: Diagnosis not present

## 2019-10-09 DIAGNOSIS — E876 Hypokalemia: Secondary | ICD-10-CM | POA: Diagnosis not present

## 2019-10-09 DIAGNOSIS — N179 Acute kidney failure, unspecified: Secondary | ICD-10-CM | POA: Diagnosis not present

## 2019-10-09 DIAGNOSIS — I69318 Other symptoms and signs involving cognitive functions following cerebral infarction: Secondary | ICD-10-CM | POA: Diagnosis not present

## 2019-10-09 DIAGNOSIS — I69398 Other sequelae of cerebral infarction: Secondary | ICD-10-CM | POA: Diagnosis not present

## 2019-10-09 DIAGNOSIS — I69351 Hemiplegia and hemiparesis following cerebral infarction affecting right dominant side: Secondary | ICD-10-CM | POA: Diagnosis not present

## 2019-10-09 DIAGNOSIS — H5461 Unqualified visual loss, right eye, normal vision left eye: Secondary | ICD-10-CM | POA: Diagnosis not present

## 2019-10-09 DIAGNOSIS — S60211D Contusion of right wrist, subsequent encounter: Secondary | ICD-10-CM | POA: Diagnosis not present

## 2019-10-09 DIAGNOSIS — I69328 Other speech and language deficits following cerebral infarction: Secondary | ICD-10-CM | POA: Diagnosis not present

## 2019-10-11 DIAGNOSIS — H5461 Unqualified visual loss, right eye, normal vision left eye: Secondary | ICD-10-CM | POA: Diagnosis not present

## 2019-10-11 DIAGNOSIS — I69328 Other speech and language deficits following cerebral infarction: Secondary | ICD-10-CM | POA: Diagnosis not present

## 2019-10-11 DIAGNOSIS — S60211D Contusion of right wrist, subsequent encounter: Secondary | ICD-10-CM | POA: Diagnosis not present

## 2019-10-11 DIAGNOSIS — I69351 Hemiplegia and hemiparesis following cerebral infarction affecting right dominant side: Secondary | ICD-10-CM | POA: Diagnosis not present

## 2019-10-11 DIAGNOSIS — E876 Hypokalemia: Secondary | ICD-10-CM | POA: Diagnosis not present

## 2019-10-11 DIAGNOSIS — I69398 Other sequelae of cerebral infarction: Secondary | ICD-10-CM | POA: Diagnosis not present

## 2019-10-11 DIAGNOSIS — I2510 Atherosclerotic heart disease of native coronary artery without angina pectoris: Secondary | ICD-10-CM | POA: Diagnosis not present

## 2019-10-11 DIAGNOSIS — N179 Acute kidney failure, unspecified: Secondary | ICD-10-CM | POA: Diagnosis not present

## 2019-10-11 DIAGNOSIS — I69318 Other symptoms and signs involving cognitive functions following cerebral infarction: Secondary | ICD-10-CM | POA: Diagnosis not present

## 2019-10-12 DIAGNOSIS — E876 Hypokalemia: Secondary | ICD-10-CM | POA: Diagnosis not present

## 2019-10-12 DIAGNOSIS — H5461 Unqualified visual loss, right eye, normal vision left eye: Secondary | ICD-10-CM | POA: Diagnosis not present

## 2019-10-12 DIAGNOSIS — I69351 Hemiplegia and hemiparesis following cerebral infarction affecting right dominant side: Secondary | ICD-10-CM | POA: Diagnosis not present

## 2019-10-12 DIAGNOSIS — I2510 Atherosclerotic heart disease of native coronary artery without angina pectoris: Secondary | ICD-10-CM | POA: Diagnosis not present

## 2019-10-12 DIAGNOSIS — I69398 Other sequelae of cerebral infarction: Secondary | ICD-10-CM | POA: Diagnosis not present

## 2019-10-12 DIAGNOSIS — I69318 Other symptoms and signs involving cognitive functions following cerebral infarction: Secondary | ICD-10-CM | POA: Diagnosis not present

## 2019-10-12 DIAGNOSIS — N179 Acute kidney failure, unspecified: Secondary | ICD-10-CM | POA: Diagnosis not present

## 2019-10-12 DIAGNOSIS — S60211D Contusion of right wrist, subsequent encounter: Secondary | ICD-10-CM | POA: Diagnosis not present

## 2019-10-12 DIAGNOSIS — I69328 Other speech and language deficits following cerebral infarction: Secondary | ICD-10-CM | POA: Diagnosis not present

## 2019-10-13 ENCOUNTER — Telehealth: Payer: Self-pay

## 2019-10-13 ENCOUNTER — Other Ambulatory Visit: Payer: Self-pay | Admitting: Family Medicine

## 2019-10-13 NOTE — Telephone Encounter (Signed)
Left message to increase O2 to 4 L but if he continues to drop less than 90, he needs to return to the [email or web address]. [facility]

## 2019-10-13 NOTE — Telephone Encounter (Signed)
[name] with [name] called stating that patient is currently on 3L O2 and stats are barely staying above 90. She states she exercised him for 1 minute on 3L O2 and his stats dropped 82 and recovered in 1 minute. 1 week she had patient stand in place for 3 minutes doing exercises on 3L O2 and his stats dropped to 72 and recovered after 2 minutes.  Can they increase O2 to 4L during exercise?  Patient has f/u with you on Monday the 2nd.

## 2019-10-15 ENCOUNTER — Other Ambulatory Visit: Payer: Self-pay | Admitting: Family Medicine

## 2019-10-15 DIAGNOSIS — I10 Essential (primary) hypertension: Secondary | ICD-10-CM

## 2019-10-16 ENCOUNTER — Other Ambulatory Visit: Payer: Self-pay

## 2019-10-16 ENCOUNTER — Other Ambulatory Visit: Payer: Medicare HMO

## 2019-10-16 DIAGNOSIS — I10 Essential (primary) hypertension: Secondary | ICD-10-CM

## 2019-10-17 ENCOUNTER — Encounter: Payer: Self-pay | Admitting: Family Medicine

## 2019-10-17 DIAGNOSIS — J449 Chronic obstructive pulmonary disease, unspecified: Secondary | ICD-10-CM | POA: Diagnosis not present

## 2019-10-17 LAB — TSH: TSH: 1.87 u[IU]/mL (ref 0.450–4.500)

## 2019-10-17 LAB — COMPREHENSIVE METABOLIC PANEL
ALT: 13 IU/L (ref 0–44)
AST: 28 IU/L (ref 0–40)
Albumin/Globulin Ratio: 1.4 (ref 1.2–2.2)
Albumin: 4 g/dL (ref 3.6–4.6)
Alkaline Phosphatase: 75 IU/L (ref 48–121)
BUN/Creatinine Ratio: 14 (ref 10–24)
BUN: 18 mg/dL (ref 8–27)
Bilirubin Total: 0.7 mg/dL (ref 0.0–1.2)
CO2: 22 mmol/L (ref 20–29)
Calcium: 9.6 mg/dL (ref 8.6–10.2)
Chloride: 104 mmol/L (ref 96–106)
Creatinine, Ser: 1.27 mg/dL (ref 0.76–1.27)
GFR calc Af Amer: 60 mL/min/{1.73_m2} (ref >59)
GFR calc non Af Amer: 52 mL/min/{1.73_m2} — ABNORMAL LOW (ref >59)
Globulin, Total: 2.9 g/dL (ref 1.5–4.5)
Glucose: 100 mg/dL — ABNORMAL HIGH (ref 65–99)
Potassium: 4.5 mmol/L (ref 3.5–5.2)
Sodium: 141 mmol/L (ref 134–144)
Total Protein: 6.9 g/dL (ref 6.0–8.5)

## 2019-10-17 LAB — CBC WITH DIFFERENTIAL/PLATELET
Basophils Absolute: 0.1 10*3/uL (ref 0.0–0.2)
Basos: 1 %
EOS (ABSOLUTE): 0.5 10*3/uL — ABNORMAL HIGH (ref 0.0–0.4)
Eos: 7 %
Hematocrit: 47.8 % (ref 37.5–51.0)
Hemoglobin: 15.4 g/dL (ref 13.0–17.7)
Immature Grans (Abs): 0.1 10*3/uL (ref 0.0–0.1)
Immature Granulocytes: 1 %
Lymphocytes Absolute: 1.7 10*3/uL (ref 0.7–3.1)
Lymphs: 23 %
MCH: 32 pg (ref 26.6–33.0)
MCHC: 32.2 g/dL (ref 31.5–35.7)
MCV: 99 fL — ABNORMAL HIGH (ref 79–97)
Monocytes Absolute: 0.8 10*3/uL (ref 0.1–0.9)
Monocytes: 11 %
Neutrophils Absolute: 4.1 10*3/uL (ref 1.4–7.0)
Neutrophils: 57 %
Platelets: 280 10*3/uL (ref 150–450)
RBC: 4.82 x10E6/uL (ref 4.14–5.80)
RDW: 13.1 % (ref 11.6–15.4)
WBC: 7.2 10*3/uL (ref 3.4–10.8)

## 2019-10-18 DIAGNOSIS — E876 Hypokalemia: Secondary | ICD-10-CM | POA: Diagnosis not present

## 2019-10-18 DIAGNOSIS — I69318 Other symptoms and signs involving cognitive functions following cerebral infarction: Secondary | ICD-10-CM | POA: Diagnosis not present

## 2019-10-18 DIAGNOSIS — N179 Acute kidney failure, unspecified: Secondary | ICD-10-CM | POA: Diagnosis not present

## 2019-10-18 DIAGNOSIS — H5461 Unqualified visual loss, right eye, normal vision left eye: Secondary | ICD-10-CM | POA: Diagnosis not present

## 2019-10-18 DIAGNOSIS — S60211D Contusion of right wrist, subsequent encounter: Secondary | ICD-10-CM | POA: Diagnosis not present

## 2019-10-18 DIAGNOSIS — I69398 Other sequelae of cerebral infarction: Secondary | ICD-10-CM | POA: Diagnosis not present

## 2019-10-18 DIAGNOSIS — I2510 Atherosclerotic heart disease of native coronary artery without angina pectoris: Secondary | ICD-10-CM | POA: Diagnosis not present

## 2019-10-18 DIAGNOSIS — I69328 Other speech and language deficits following cerebral infarction: Secondary | ICD-10-CM | POA: Diagnosis not present

## 2019-10-18 DIAGNOSIS — I69351 Hemiplegia and hemiparesis following cerebral infarction affecting right dominant side: Secondary | ICD-10-CM | POA: Diagnosis not present

## 2019-10-18 NOTE — Progress Notes (Signed)
Subjective:  Patient ID: Earl Mueller, male    DOB: [birth year]  Age: 84 y.o. MRN: [id number]  Chief Complaint  Patient presents with  . Hypothyroidism  . Hyperlipidemia    Labs drawn 10/16/2019  . Hypertension  . Depression  . Prediabetes    HPI  Pt presents for follow up of hypertensive heart disease.  His current cardiac medication regimen includes .  Earl Mueller does not check his blood pressure other than at his clinic appointments.  He is tolerating the medication well without side effects.  Compliance with treatment has been good; he takes his medication as directed and follows up as directed.  Bp Ranges from 146-182/70s-80s. Pulse 50s, oxygen 90s with 3 Liters    Earl Mueller presents with a diagnosis of atherosclerosis of native arteries of extremities with intermittent claudication, bilateral legs.  He does not exercise as his pain is limiting.Currently on plavix and aspirin.    In regard to the major depressive disorder, single episode, moderate, this is a routine follow-up.  Presently, he feels a mild degree of depression.  Current medications include Paxil.  He denies any affective symptoms, including anhedonia and sadness.      Earl Mueller presents with a diagnosis of impaired fasting glucose.  Eats whatever his wife makes.    Pt presents with hyperlipidemia.  Current treatment includes Lipitor and Tricor.  Compliance with treatment has been good; he takes his medication as directed, maintains his low cholesterol diet, and follows up as directed.  He denies experiencing any hypercholesterolemia related symptoms.      Concerning other specified hypothyroidism, he is currently taking Synthroid, 112 mcg daily.  He denies any related symptoms.    Current Outpatient Medications on File Prior to Visit  Medication Sig Dispense Refill  . Acetaminophen 500 MG capsule Take 2 capsules (1,000 mg total) by mouth every 6 (six) hours as needed for fever or pain. 30 capsule 0  . aspirin EC 81 MG tablet  Take 81 mg by mouth daily.    Marland Kitchen atorvastatin (LIPITOR) 80 MG tablet TAKE 1 TABLET EVERY DAY (Patient taking differently: Take 80 mg by mouth daily. ) 90 tablet 0  . cilostazol (PLETAL) 100 MG tablet Take 100 mg by mouth 2 (two) times daily.    . clopidogrel (PLAVIX) 75 MG tablet TAKE 1 TABLET EVERY DAY (Patient taking differently: Take 75 mg by mouth daily. ) 90 tablet 3  . fenofibrate 160 MG tablet Take 160 mg by mouth daily.    Marland Kitchen levothyroxine (SYNTHROID) 112 MCG tablet Take 1 tablet (112 mcg total) by mouth daily. 90 tablet 0  . losartan (COZAAR) 25 MG tablet Take 1 tablet (25 mg total) by mouth daily. (Patient taking differently: Take 12.5 mg by mouth daily. ) 30 tablet 4  . metoprolol succinate (TOPROL-XL) 25 MG 24 hr tablet Take 1 tablet (25 mg total) by mouth daily. 30 tablet 4  . Multiple Vitamins-Minerals (CENTRUM SILVER PO) Take 1 tablet by mouth daily.    . nitroGLYCERIN (NITROSTAT) 0.4 MG SL tablet Place 0.4 mg under the tongue every 5 (five) minutes as needed for chest pain.    Marland Kitchen PARoxetine (PAXIL) 20 MG tablet TAKE 1 TABLET EVERY DAY 90 tablet 3   No current facility-administered medications on file prior to visit.   Past Medical History:  Diagnosis Date  . Anxiety   . Arthritis   . Cancer ([place])    skin  . Carotid artery stenosis 03/26/2017  . Coronary artery disease  involving native coronary artery of native heart without angina pectoris 11/20/2014  . Dyslipidemia 11/20/2014  . Essential hypertension 11/20/2014   Dr Geraldo Pitter, [facility]  . Hypothyroidism   . Peripheral vascular disease ([place])   . Skin ulcer of scalp ([place]) 05/10/2013  . Symptomatic carotid artery stenosis 03/05/2017   Past Surgical History:  Procedure Laterality Date  . BACK SURGERY    . CARDIAC CATHETERIZATION  03/06/10   NL-LM, 40%pLAD, 30% mid/distal LAD, 98% ostial DIAG (small), 30%pCx, 50%mCx, 30%dCx, 30% OM2, 40%dRCA; Med RX rec ([facility])  . caritid endart    . CORONARY ATHERECTOMY N/A 09/14/2019    Procedure: CORONARY ATHERECTOMY;  Surgeon: Martinique, Peter M, MD;  Location: [facility];  Service: Cardiovascular;  Laterality: N/A;  . CORONARY ATHERECTOMY N/A 09/15/2019   Procedure: CORONARY ATHERECTOMY;  Surgeon: Martinique, Peter M, MD;  Location: [facility];  Service: Cardiovascular;  Laterality: N/A;  . CORONARY STENT INTERVENTION N/A 09/14/2019   Procedure: CORONARY STENT INTERVENTION;  Surgeon: Martinique, Peter M, MD;  Location: [facility];  Service: Cardiovascular;  Laterality: N/A;  . CORONARY STENT INTERVENTION N/A 09/15/2019   Procedure: CORONARY STENT INTERVENTION;  Surgeon: Martinique, Peter M, MD;  Location: [facility];  Service: Cardiovascular;  Laterality: N/A;  . ENDARTERECTOMY Left 03/26/2017   Procedure: ENDARTERECTOMY CAROTID LEFT;  Surgeon: Rosetta Posner, MD;  Location: [facility];  Service: Vascular;  Laterality: Left;  . femerao bypass    . LEFT HEART CATH AND CORONARY ANGIOGRAPHY N/A 02/03/2017   Procedure: LEFT HEART CATH AND CORONARY ANGIOGRAPHY;  Surgeon: Martinique, Peter M, MD;  Location: [facility] [place];  Service: Cardiovascular;  Laterality: N/A;  . LEFT HEART CATH AND CORONARY ANGIOGRAPHY N/A 09/14/2019   Procedure: LEFT HEART CATH AND CORONARY ANGIOGRAPHY;  Surgeon: Martinique, Peter M, MD;  Location: [facility];  Service: Cardiovascular;  Laterality: N/A;  . LOOP RECORDER INSERTION N/A 09/25/2019   Procedure: LOOP RECORDER INSERTION;  Surgeon: Deboraha Sprang, MD;  Location: [facility];  Service: Cardiovascular;  Laterality: N/A;  . PATCH ANGIOPLASTY Left 03/26/2017   Procedure: PATCH ANGIOPLASTY LEFT CAROTID ARTERY;  Surgeon: Rosetta Posner, MD;  Location: [facility];  Service: Vascular;  Laterality: Left;  . SCALP LACERATION REPAIR N/A 05/23/2013   Procedure: EXCISION SCALP ULCER DEBRIDEMENT OF SKIN AND BONE WITH PLACEMENT OF A-CELL;  Surgeon: Irene Limbo, MD;  Location: [facility];  Service: Plastics;  Laterality: N/A;  . STOMACH SURGERY     x2  .  VASCULAR SURGERY     [facility] 10/15/05    Family History  Problem Relation Age of Onset  . Heart failure Father    Social History   Socioeconomic History  . Marital status: Married    Spouse name: Not on file  . Number of children: Not on file  . Years of education: Not on file  . Highest education level: Not on file  Occupational History  . Not on file  Tobacco Use  . Smoking status: Former Smoker    Quit date: 12/18/2007    Years since quitting: 11.8  . Smokeless tobacco: Never Used  Vaping Use  . Vaping Use: Never used  Substance and Sexual Activity  . Alcohol use: Yes    Comment: occ  . Drug use: No  . Sexual activity: Not on file  Other Topics Concern  . Not on file  Social History Narrative  . Not on file   Social  Determinants of Health   Financial Resource Strain:   . Difficulty of Paying Living Expenses:   Food Insecurity: No Food Insecurity  . Worried About Charity fundraiser in the Last Year: Never true  . Ran Out of Food in the Last Year: Never true  Transportation Needs: No Transportation Needs  . Lack of Transportation (Medical): No  . Lack of Transportation (Non-Medical): No  Physical Activity: Inactive  . Days of Exercise per Week: 0 days  . Minutes of Exercise per Session: 0 min  Stress:   . Feeling of Stress :   Social Connections:   . Frequency of Communication with Friends and Family:   . Frequency of Social Gatherings with Friends and Family:   . Attends Religious Services:   . Active Member of Clubs or Organizations:   . Attends Archivist Meetings:   Marland Kitchen Marital Status:     Review of Systems  Constitutional: Negative for chills, diaphoresis, fatigue and fever.  HENT: Negative for congestion, ear pain and sore throat.   Respiratory: Negative for cough and shortness of breath (ok unless removes oxygen).   Cardiovascular: Negative for chest pain and leg swelling.  Gastrointestinal: Negative for abdominal pain, constipation,  diarrhea, nausea and vomiting.  Genitourinary: Negative for dysuria and urgency.  Musculoskeletal: Negative for arthralgias and myalgias.  Neurological: Negative for dizziness and headaches.  Psychiatric/Behavioral: Negative for dysphoric mood.     Objective:  BP 132/90   Pulse 84   Temp (!) 97.3 F (36.3 C)   Resp 18   Ht 5\' 10"  (1.778 m)   Wt 143 lb (64.9 kg)   SpO2 95%   BMI 20.52 kg/m   BP/Weight 10/19/2019 10/02/2019 3/66/4403  Systolic BP 474 259 563  Diastolic BP 90 68 60  Wt. (Lbs) 143 142 -  BMI 20.52 20.37 -    Physical Exam Vitals reviewed.  Constitutional:      Appearance: Normal appearance. He is normal weight.  Cardiovascular:     Rate and Rhythm: Normal rate and regular rhythm.  Pulmonary:     Effort: Pulmonary effort is normal.     Breath sounds: Normal breath sounds.  Abdominal:     General: Abdomen is flat. Bowel sounds are normal.     Palpations: Abdomen is soft.  Neurological:     Mental Status: He is alert and oriented to person, place, and time.  Psychiatric:        Mood and Affect: Mood normal.        Behavior: Behavior normal.       Lab Results  Component Value Date   WBC 7.2 10/16/2019   HGB 15.4 10/16/2019   HCT 47.8 10/16/2019   PLT 280 10/16/2019   GLUCOSE 100 (H) 10/16/2019   CHOL 124 09/23/2019   TRIG 52 09/23/2019   HDL 51 09/23/2019   LDLCALC 63 09/23/2019   ALT 13 10/16/2019   AST 28 10/16/2019   NA 141 10/16/2019   K 4.5 10/16/2019   CL 104 10/16/2019   CREATININE 1.27 10/16/2019   BUN 18 10/16/2019   CO2 22 10/16/2019   TSH 1.870 10/16/2019   INR 1.2 09/21/2019   HGBA1C 5.6 09/23/2019      Assessment & Plan:   1. Hypertensive heart disease without heart failure The current medical regimen is effective;  continue present plan and medications. Keep follow up with cardiology Patient to bring bp cuff to check as it seems to be running high at home.  2. Dyslipidemia The current medical regimen is effective;   continue present plan and medications.  3. Chronic respiratory failure with hypoxia (HCC) Continue oxygen. Abnormal ct of chect in 2019. No cancer, but emphysematous changes were noted. Repeat ct of chest 2 months ago again confirmed emphysema.  4. COPD -  Refer to pulmonary.   Follow-up: Return in about 3 months (around 01/19/2020) for fasting.  An After Visit Summary was printed and given to the patient.  Rochel Brome [facility] [phone]

## 2019-10-19 ENCOUNTER — Ambulatory Visit (INDEPENDENT_AMBULATORY_CARE_PROVIDER_SITE_OTHER): Payer: Medicare HMO | Admitting: Family Medicine

## 2019-10-19 ENCOUNTER — Other Ambulatory Visit: Payer: Self-pay

## 2019-10-19 VITALS — BP 132/90 | HR 84 | Temp 97.3°F | Resp 18 | Ht 70.0 in | Wt 143.0 lb

## 2019-10-19 DIAGNOSIS — I119 Hypertensive heart disease without heart failure: Secondary | ICD-10-CM | POA: Diagnosis not present

## 2019-10-19 DIAGNOSIS — E876 Hypokalemia: Secondary | ICD-10-CM | POA: Diagnosis not present

## 2019-10-19 DIAGNOSIS — N179 Acute kidney failure, unspecified: Secondary | ICD-10-CM | POA: Diagnosis not present

## 2019-10-19 DIAGNOSIS — I69351 Hemiplegia and hemiparesis following cerebral infarction affecting right dominant side: Secondary | ICD-10-CM | POA: Diagnosis not present

## 2019-10-19 DIAGNOSIS — H5461 Unqualified visual loss, right eye, normal vision left eye: Secondary | ICD-10-CM | POA: Diagnosis not present

## 2019-10-19 DIAGNOSIS — I69398 Other sequelae of cerebral infarction: Secondary | ICD-10-CM | POA: Diagnosis not present

## 2019-10-19 DIAGNOSIS — I2510 Atherosclerotic heart disease of native coronary artery without angina pectoris: Secondary | ICD-10-CM | POA: Diagnosis not present

## 2019-10-19 DIAGNOSIS — E785 Hyperlipidemia, unspecified: Secondary | ICD-10-CM | POA: Diagnosis not present

## 2019-10-19 DIAGNOSIS — J9611 Chronic respiratory failure with hypoxia: Secondary | ICD-10-CM

## 2019-10-19 DIAGNOSIS — J438 Other emphysema: Secondary | ICD-10-CM

## 2019-10-19 DIAGNOSIS — S60211D Contusion of right wrist, subsequent encounter: Secondary | ICD-10-CM | POA: Diagnosis not present

## 2019-10-19 DIAGNOSIS — I69328 Other speech and language deficits following cerebral infarction: Secondary | ICD-10-CM | POA: Diagnosis not present

## 2019-10-19 DIAGNOSIS — I69318 Other symptoms and signs involving cognitive functions following cerebral infarction: Secondary | ICD-10-CM | POA: Diagnosis not present

## 2019-10-19 NOTE — Patient Instructions (Addendum)
Bring bp cuff to check with ours.  Refer to lung specialist

## 2019-10-24 DIAGNOSIS — S60211D Contusion of right wrist, subsequent encounter: Secondary | ICD-10-CM | POA: Diagnosis not present

## 2019-10-24 DIAGNOSIS — E876 Hypokalemia: Secondary | ICD-10-CM | POA: Diagnosis not present

## 2019-10-24 DIAGNOSIS — N179 Acute kidney failure, unspecified: Secondary | ICD-10-CM | POA: Diagnosis not present

## 2019-10-24 DIAGNOSIS — I69398 Other sequelae of cerebral infarction: Secondary | ICD-10-CM | POA: Diagnosis not present

## 2019-10-24 DIAGNOSIS — I69318 Other symptoms and signs involving cognitive functions following cerebral infarction: Secondary | ICD-10-CM | POA: Diagnosis not present

## 2019-10-24 DIAGNOSIS — I2510 Atherosclerotic heart disease of native coronary artery without angina pectoris: Secondary | ICD-10-CM | POA: Diagnosis not present

## 2019-10-24 DIAGNOSIS — H5461 Unqualified visual loss, right eye, normal vision left eye: Secondary | ICD-10-CM | POA: Diagnosis not present

## 2019-10-24 DIAGNOSIS — I69328 Other speech and language deficits following cerebral infarction: Secondary | ICD-10-CM | POA: Diagnosis not present

## 2019-10-24 DIAGNOSIS — I69351 Hemiplegia and hemiparesis following cerebral infarction affecting right dominant side: Secondary | ICD-10-CM | POA: Diagnosis not present

## 2019-10-26 DIAGNOSIS — N179 Acute kidney failure, unspecified: Secondary | ICD-10-CM | POA: Diagnosis not present

## 2019-10-26 DIAGNOSIS — I69351 Hemiplegia and hemiparesis following cerebral infarction affecting right dominant side: Secondary | ICD-10-CM | POA: Diagnosis not present

## 2019-10-26 DIAGNOSIS — I69398 Other sequelae of cerebral infarction: Secondary | ICD-10-CM | POA: Diagnosis not present

## 2019-10-26 DIAGNOSIS — I69318 Other symptoms and signs involving cognitive functions following cerebral infarction: Secondary | ICD-10-CM | POA: Diagnosis not present

## 2019-10-26 DIAGNOSIS — I69328 Other speech and language deficits following cerebral infarction: Secondary | ICD-10-CM | POA: Diagnosis not present

## 2019-10-26 DIAGNOSIS — H5461 Unqualified visual loss, right eye, normal vision left eye: Secondary | ICD-10-CM | POA: Diagnosis not present

## 2019-10-26 DIAGNOSIS — E876 Hypokalemia: Secondary | ICD-10-CM | POA: Diagnosis not present

## 2019-10-26 DIAGNOSIS — S60211D Contusion of right wrist, subsequent encounter: Secondary | ICD-10-CM | POA: Diagnosis not present

## 2019-10-26 DIAGNOSIS — I2510 Atherosclerotic heart disease of native coronary artery without angina pectoris: Secondary | ICD-10-CM | POA: Diagnosis not present

## 2019-10-29 ENCOUNTER — Encounter: Payer: Self-pay | Admitting: Family Medicine

## 2019-10-29 DIAGNOSIS — I119 Hypertensive heart disease without heart failure: Secondary | ICD-10-CM

## 2019-10-29 DIAGNOSIS — J9611 Chronic respiratory failure with hypoxia: Secondary | ICD-10-CM

## 2019-10-29 HISTORY — DX: Chronic respiratory failure with hypoxia: J96.11

## 2019-10-29 HISTORY — DX: Hypertensive heart disease without heart failure: I11.9

## 2019-10-30 DIAGNOSIS — I69351 Hemiplegia and hemiparesis following cerebral infarction affecting right dominant side: Secondary | ICD-10-CM | POA: Diagnosis not present

## 2019-10-30 DIAGNOSIS — I69398 Other sequelae of cerebral infarction: Secondary | ICD-10-CM | POA: Diagnosis not present

## 2019-10-30 DIAGNOSIS — I2510 Atherosclerotic heart disease of native coronary artery without angina pectoris: Secondary | ICD-10-CM | POA: Diagnosis not present

## 2019-10-30 DIAGNOSIS — N179 Acute kidney failure, unspecified: Secondary | ICD-10-CM | POA: Diagnosis not present

## 2019-10-30 DIAGNOSIS — S60211D Contusion of right wrist, subsequent encounter: Secondary | ICD-10-CM | POA: Diagnosis not present

## 2019-10-30 DIAGNOSIS — H5461 Unqualified visual loss, right eye, normal vision left eye: Secondary | ICD-10-CM | POA: Diagnosis not present

## 2019-10-30 DIAGNOSIS — E876 Hypokalemia: Secondary | ICD-10-CM | POA: Diagnosis not present

## 2019-10-30 DIAGNOSIS — I69318 Other symptoms and signs involving cognitive functions following cerebral infarction: Secondary | ICD-10-CM | POA: Diagnosis not present

## 2019-10-30 DIAGNOSIS — I69328 Other speech and language deficits following cerebral infarction: Secondary | ICD-10-CM | POA: Diagnosis not present

## 2019-10-31 ENCOUNTER — Encounter: Payer: Self-pay | Admitting: Adult Health

## 2019-10-31 ENCOUNTER — Ambulatory Visit: Payer: Medicare HMO | Admitting: Adult Health

## 2019-10-31 VITALS — BP 144/82 | HR 66 | Ht 70.0 in | Wt 143.0 lb

## 2019-10-31 DIAGNOSIS — I10 Essential (primary) hypertension: Secondary | ICD-10-CM | POA: Diagnosis not present

## 2019-10-31 DIAGNOSIS — I2510 Atherosclerotic heart disease of native coronary artery without angina pectoris: Secondary | ICD-10-CM

## 2019-10-31 DIAGNOSIS — I6523 Occlusion and stenosis of bilateral carotid arteries: Secondary | ICD-10-CM | POA: Diagnosis not present

## 2019-10-31 DIAGNOSIS — I639 Cerebral infarction, unspecified: Secondary | ICD-10-CM | POA: Diagnosis not present

## 2019-10-31 DIAGNOSIS — E785 Hyperlipidemia, unspecified: Secondary | ICD-10-CM | POA: Diagnosis not present

## 2019-10-31 NOTE — Patient Instructions (Addendum)
Continue aspirin 81 mg daily and clopidogrel 75 mg daily  and atorvastatin and fenofibrate for secondary stroke prevention  Ensure routine follow-up with cardiology for ongoing monitoring and management  Continue to follow up with PCP regarding cholesterol and blood pressure management  Maintain strict control of hypertension with blood pressure goal below 130/90 and cholesterol with LDL cholesterol (bad cholesterol) goal below 70 mg/dL.     Followup in the future with me in 6 months or call earlier if needed       Thank you for coming to see [place] at [facility]. I hope we have been able to provide you high quality care today.  You may receive a patient satisfaction survey over the next few weeks. We would appreciate your feedback and comments so that we may continue to improve ourselves and the health of our patients.    Stroke Prevention Some medical conditions and behaviors are associated with a higher chance of having a stroke. You can help prevent a stroke by making nutrition, lifestyle, and other changes, including managing any medical conditions you may have. What nutrition changes can be made?   Eat healthy foods. You can do this by: ? Choosing foods high in fiber, such as fresh fruits and vegetables and whole grains. ? Eating at least 5 or more servings of fruits and vegetables a day. Try to fill half of your plate at each meal with fruits and vegetables. ? Choosing lean protein foods, such as lean cuts of meat, poultry without skin, fish, tofu, beans, and nuts. ? Eating low-fat dairy products. ? Avoiding foods that are high in salt (sodium). This can help lower blood pressure. ? Avoiding foods that have saturated fat, trans fat, and cholesterol. This can help prevent high cholesterol. ? Avoiding processed and premade foods.  Follow your health care provider's specific guidelines for losing weight, controlling high blood pressure (hypertension), lowering  high cholesterol, and managing diabetes. These may include: ? Reducing your daily calorie intake. ? Limiting your daily sodium intake to 1,500 milligrams (mg). ? Using only healthy fats for cooking, such as olive oil, canola oil, or sunflower oil. ? Counting your daily carbohydrate intake. What lifestyle changes can be made?  Maintain a healthy weight. Talk to your health care provider about your ideal weight.  Get at least 30 minutes of moderate physical activity at least 5 days a week. Moderate activity includes brisk walking, biking, and swimming.  Do not use any products that contain nicotine or tobacco, such as cigarettes and e-cigarettes. If you need help quitting, ask your health care provider. It may also be helpful to avoid exposure to secondhand smoke.  Limit alcohol intake to no more than 1 drink a day for nonpregnant women and 2 drinks a day for men. One drink equals 12 oz of beer, 5 oz of wine, or 1 oz of hard liquor.  Stop any illegal drug use.  Avoid taking birth control pills. Talk to your health care provider about the risks of taking birth control pills if: ? You are over 19 years old. ? You smoke. ? You get migraines. ? You have ever had a blood clot. What other changes can be made?  Manage your cholesterol levels. ? Eating a healthy diet is important for preventing high cholesterol. If cholesterol cannot be managed through diet alone, you may also need to take medicines. ? Take any prescribed medicines to control your cholesterol as told by your health care provider.  Manage your  diabetes. ? Eating a healthy diet and exercising regularly are important parts of managing your blood sugar. If your blood sugar cannot be managed through diet and exercise, you may need to take medicines. ? Take any prescribed medicines to control your diabetes as told by your health care provider.  Control your hypertension. ? To reduce your risk of stroke, try to keep your blood  pressure below 130/80. ? Eating a healthy diet and exercising regularly are an important part of controlling your blood pressure. If your blood pressure cannot be managed through diet and exercise, you may need to take medicines. ? Take any prescribed medicines to control hypertension as told by your health care provider. ? Ask your health care provider if you should monitor your blood pressure at home. ? Have your blood pressure checked every year, even if your blood pressure is normal. Blood pressure increases with age and some medical conditions.  Get evaluated for sleep disorders (sleep apnea). Talk to your health care provider about getting a sleep evaluation if you snore a lot or have excessive sleepiness.  Take over-the-counter and prescription medicines only as told by your health care provider. Aspirin or blood thinners (antiplatelets or anticoagulants) may be recommended to reduce your risk of forming blood clots that can lead to stroke.  Make sure that any other medical conditions you have, such as atrial fibrillation or atherosclerosis, are managed. What are the warning signs of a stroke? The warning signs of a stroke can be easily remembered as BEFAST.  B is for balance. Signs include: ? Dizziness. ? Loss of balance or coordination. ? Sudden trouble walking.  E is for eyes. Signs include: ? A sudden change in vision. ? Trouble seeing.  F is for face. Signs include: ? Sudden weakness or numbness of the face. ? The face or eyelid drooping to one side.  A is for arms. Signs include: ? Sudden weakness or numbness of the arm, usually on one side of the body.  S is for speech. Signs include: ? Trouble speaking (aphasia). ? Trouble understanding.  T is for time. ? These symptoms may represent a serious problem that is an emergency. Do not wait to see if the symptoms will go away. Get medical help right away. Call your local emergency services (911 in the [place] Do not drive  yourself to the hospital.  Other signs of stroke may include: ? A sudden, severe headache with no known cause. ? Nausea or vomiting. ? Seizure. Where to find more information For more information, visit:  American Stroke Association: www.strokeassociation.org  National Stroke Association: www.stroke.org Summary  You can prevent a stroke by eating healthy, exercising, not smoking, limiting alcohol intake, and managing any medical conditions you may have.  Do not use any products that contain nicotine or tobacco, such as cigarettes and e-cigarettes. If you need help quitting, ask your health care provider. It may also be helpful to avoid exposure to secondhand smoke.  Remember BEFAST for warning signs of stroke. Get help right away if you or a loved one has any of these signs. This information is not intended to replace advice given to you by your health care provider. Make sure you discuss any questions you have with your health care provider. Document Revised: 02/12/2017 Document Reviewed: 04/07/2016 Elsevier Patient Education  2020 [employer].

## 2019-10-31 NOTE — Progress Notes (Signed)
[facility] [address] [place]. [place] [address] [phone]       HOSPITAL FOLLOW UP NOTE  [name] Date of Birth:  Dec 18, 1935 Medical Record Number:  [id number]   Reason for Referral:  hospital stroke follow up    SUBJECTIVE:   CHIEF COMPLAINT:  Chief Complaint  Patient presents with  . Hospitalization Follow-up    HFU for stroke. States he has been doing well since being home. Denied any new symptoms.   . room 5    with wife     HPI:   EarlEarl L Tuckeris a 84 y.o.malewith history of prior stroke, carotid artery stenosis, coronary artery disease status post LAD drug eluting stent 09/14/2019 and Cfx drug eluting stent 7/2/2021who presented on 721 with sudden onset expressive aphasia, R facial droop and R hemiparesis.  Stroke work-up revealed multifocal cerebral infarct (left parietal lobe, right posterior frontal subcortical white matter and right cerebellum) s/p TPA, embolic pattern secondary to unclear source.  CTA showed bilateral ICA siphon high-grade stenosis and proximal right ICA 60% stenosis.  2D echo reported EF 35 to 40%.  Placement of loop recorder to evaluate for possible atrial fibrillation.  Continued PTA aspirin 81 mg daily and clopidogrel 75 mg daily for secondary stroke prevention and recent stent placement.  Advise close follow-up with cardiology outpatient for CAD s/p recent stent placement and cardiomyopathy.  History of HTN with long-term BP goal 130-150 due to high-grade stenosis.  LDL 63 and recommended continuation of atorvastatin 80 mg daily and fenofibrate.  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  Other active problems include hypothyroidism, depression, AKI, hypokalemia, history of carotid artery disease s/p B CEA, severe emphysema and aortic arthrosclerosis.  Evaluated by therapy who recommended discharge home with [facility] PT  and discharged home in stable condition.  Stroke: Multifocal cerebral infarct s/p tPA, embolic pattern, source unclear  Code Stroke CT head No acute abnormality. ASPECTS 10.   CTA head &neck no LVO. Right petrous ICA, bilateral ICA siphon high-grade stenosis, proximal R ICA 60%,moderate narrowing L VA origin.   MRI -Acute cortical infarction in the left parietal lobe.Few other scattered left parietal acute infarctions affecting the cortical and subcortical brain. Small acute infarction affecting the right poster frontal subcortical white matter. Punctate acute infarction in the right cerebellum. Chronic small-vessel ischemic changes otherwise throughout the cerebral hemispheric white matter.  2D Echo5/2021 EF 39%. Severe LVH. This admission EF 35 to 40%.  LE venous doppler no DVT  Loop recorder placed to assess for A. fib as potential etiology  LDL- 63  HgbA1c - 5.6  aspirin 81 mg daily, clopidogrel 75 mg daily prior to admission, now on aspirin 81 mg daily and Plavix 75 mg daily. Continue on discharge given recent PCI.  Therapy recommendations: HH PT, no OT or SLP  Disposition: return home  Today, 10/31/2019, Earl Mueller is a very pleasant Caucasian male who is being seen for hospital follow-up accompanied by his wife  He has recovered well without residual deficits Denies new or reoccurring stroke/TIA symptoms  Continues on aspirin 81 mg daily and Plavix without bleeding or bruising Continues on atorvastatin 80 mg daily and fenofibrate without side effects including myalgias Blood pressure today 144/82- currently awaiting for new BP cuff as prior cuff not accurate  Recorder has not shown atrial fibrillation thus far  No neurologic follow-up or stroke related concerns at this  time       ROS:   14 system review of systems performed and negative with exception of SOB with exertion   PMH:  Past Medical History:  Diagnosis Date  . Anxiety   . Arthritis     . Cancer ([place])    skin  . Carotid artery stenosis 03/26/2017  . Coronary artery disease involving native coronary artery of native heart without angina pectoris 11/20/2014  . Dyslipidemia 11/20/2014  . Essential hypertension 11/20/2014   Dr Geraldo Pitter, [facility]  . Hypothyroidism   . Peripheral vascular disease ([place])   . Skin ulcer of scalp ([place]) 05/10/2013  . Symptomatic carotid artery stenosis 03/05/2017    PSH:  Past Surgical History:  Procedure Laterality Date  . BACK SURGERY    . CARDIAC CATHETERIZATION  03/06/10   NL-LM, 40%pLAD, 30% mid/distal LAD, 98% ostial DIAG (small), 30%pCx, 50%mCx, 30%dCx, 30% OM2, 40%dRCA; Med RX rec ([facility])  . caritid endart    . CORONARY ATHERECTOMY N/A 09/14/2019   Procedure: CORONARY ATHERECTOMY;  Surgeon: Martinique, Peter M, MD;  Location: [place] [facility];  Service: Cardiovascular;  Laterality: N/A;  . CORONARY ATHERECTOMY N/A 09/15/2019   Procedure: CORONARY ATHERECTOMY;  Surgeon: Martinique, Peter M, MD;  Location: [place] [facility];  Service: Cardiovascular;  Laterality: N/A;  . CORONARY STENT INTERVENTION N/A 09/14/2019   Procedure: CORONARY STENT INTERVENTION;  Surgeon: Martinique, Peter M, MD;  Location: [place] [facility];  Service: Cardiovascular;  Laterality: N/A;  . CORONARY STENT INTERVENTION N/A 09/15/2019   Procedure: CORONARY STENT INTERVENTION;  Surgeon: Martinique, Peter M, MD;  Location: [place] [facility];  Service: Cardiovascular;  Laterality: N/A;  . ENDARTERECTOMY Left 03/26/2017   Procedure: ENDARTERECTOMY CAROTID LEFT;  Surgeon: Rosetta Posner, MD;  Location: [facility];  Service: Vascular;  Laterality: Left;  . femerao bypass    . LEFT HEART CATH AND CORONARY ANGIOGRAPHY N/A 02/03/2017   Procedure: LEFT HEART CATH AND CORONARY ANGIOGRAPHY;  Surgeon: Martinique, Peter M, MD;  Location: [place] [facility];  Service: Cardiovascular;  Laterality: N/A;  . LEFT HEART CATH AND CORONARY ANGIOGRAPHY N/A 09/14/2019   Procedure: LEFT HEART CATH AND CORONARY ANGIOGRAPHY;   Surgeon: Martinique, Peter M, MD;  Location: [place] [facility];  Service: Cardiovascular;  Laterality: N/A;  . LOOP RECORDER INSERTION N/A 09/25/2019   Procedure: LOOP RECORDER INSERTION;  Surgeon: Deboraha Sprang, MD;  Location: [place] [facility];  Service: Cardiovascular;  Laterality: N/A;  . PATCH ANGIOPLASTY Left 03/26/2017   Procedure: PATCH ANGIOPLASTY LEFT CAROTID ARTERY;  Surgeon: Rosetta Posner, MD;  Location: [place];  Service: Vascular;  Laterality: Left;  . SCALP LACERATION REPAIR N/A 05/23/2013   Procedure: EXCISION SCALP ULCER DEBRIDEMENT OF SKIN AND BONE WITH PLACEMENT OF A-CELL;  Surgeon: Irene Limbo, MD;  Location: [facility];  Service: Plastics;  Laterality: N/A;  . STOMACH SURGERY     x2  . VASCULAR SURGERY     AFBG 10/15/05    Social History:  Social History   Socioeconomic History  . Marital status: Married    Spouse name: Not on file  . Number of children: Not on file  . Years of education: Not on file  . Highest education level: Not on file  Occupational History  . Not on file  Tobacco Use  . Smoking status: Former Smoker    Quit date: 12/18/2007    Years since quitting: 11.8  . Smokeless tobacco: Never Used  Vaping Use  .  Vaping Use: Never used  Substance and Sexual Activity  . Alcohol use: Yes    Comment: occ  . Drug use: No  . Sexual activity: Not on file  Other Topics Concern  . Not on file  Social History Narrative  . Not on file   Social Determinants of Health   Financial Resource Strain:   . Difficulty of Paying Living Expenses:   Food Insecurity: No Food Insecurity  . Worried About Charity fundraiser in the Last Year: Never true  . Ran Out of Food in the Last Year: Never true  Transportation Needs: No Transportation Needs  . Lack of Transportation (Medical): No  . Lack of Transportation (Non-Medical): No  Physical Activity: Inactive  . Days of Exercise per Week: 0 days  . Minutes of Exercise per Session: 0 min  Stress:   . Feeling of Stress  :   Social Connections:   . Frequency of Communication with Friends and Family:   . Frequency of Social Gatherings with Friends and Family:   . Attends Religious Services:   . Active Member of Clubs or Organizations:   . Attends Archivist Meetings:   Marland Kitchen Marital Status:   Intimate Partner Violence:   . Fear of Current or Ex-Partner:   . Emotionally Abused:   Marland Kitchen Physically Abused:   . Sexually Abused:     Family History:  Family History  Problem Relation Age of Onset  . Heart failure Father     Medications:   Current Outpatient Medications on File Prior to Visit  Medication Sig Dispense Refill  . Acetaminophen 500 MG capsule Take 2 capsules (1,000 mg total) by mouth every 6 (six) hours as needed for fever or pain. 30 capsule 0  . aspirin EC 81 MG tablet Take 81 mg by mouth daily.    Marland Kitchen atorvastatin (LIPITOR) 80 MG tablet TAKE 1 TABLET EVERY DAY (Patient taking differently: Take 80 mg by mouth daily. ) 90 tablet 0  . cilostazol (PLETAL) 100 MG tablet Take 100 mg by mouth 2 (two) times daily.    . clopidogrel (PLAVIX) 75 MG tablet TAKE 1 TABLET EVERY DAY (Patient taking differently: Take 75 mg by mouth daily. ) 90 tablet 3  . fenofibrate 160 MG tablet Take 160 mg by mouth daily.    Marland Kitchen levothyroxine (SYNTHROID) 112 MCG tablet Take 1 tablet (112 mcg total) by mouth daily. 90 tablet 0  . losartan (COZAAR) 25 MG tablet Take 1 tablet (25 mg total) by mouth daily. (Patient taking differently: Take 12.5 mg by mouth daily. ) 30 tablet 4  . metoprolol succinate (TOPROL-XL) 25 MG 24 hr tablet Take 1 tablet (25 mg total) by mouth daily. 30 tablet 4  . Multiple Vitamins-Minerals (CENTRUM SILVER PO) Take 1 tablet by mouth daily.    . nitroGLYCERIN (NITROSTAT) 0.4 MG SL tablet Place 0.4 mg under the tongue every 5 (five) minutes as needed for chest pain.    Marland Kitchen PARoxetine (PAXIL) 20 MG tablet TAKE 1 TABLET EVERY DAY 90 tablet 3   No current facility-administered medications on file prior to  visit.    Allergies:   Allergies  Allergen Reactions  . Other Other (See Comments)    Any narcotic makes him a "wild man"  Delusions (intolerance)      OBJECTIVE:  Physical Exam  Vitals:   10/31/19 1355  BP: (!) 144/82  Pulse: 66  Weight: 143 lb (64.9 kg)  Height: 5\' 10"  (1.778 m)  Body mass index is 20.52 kg/m. No exam data present   General: well developed, well nourished,  pleasant elderly Caucasian male, seated, in no evident distress Head: head normocephalic and atraumatic.   Neck: supple with no carotid or supraclavicular bruits Cardiovascular: regular rate and rhythm, no murmurs Musculoskeletal: no deformity Skin:  no rash/petichiae Vascular:  Normal pulses all extremities   Neurologic Exam Mental Status: Awake and fully alert.  Fluent speech and language. Oriented to place and time. Recent and remote memory intact. Attention span, concentration and fund of knowledge appropriate. Mood and affect appropriate.  Cranial Nerves: Fundoscopic exam reveals sharp disc margins. Pupils equal, briskly reactive to light. Extraocular movements full without nystagmus. Visual fields full to confrontation. Hearing intact. Facial sensation intact. Face, tongue, palate moves normally and symmetrically.  Motor: Normal bulk and tone. Normal strength in all tested extremity muscles. Sensory.: intact to touch , pinprick , position and vibratory sensation.  Coordination: Rapid alternating movements normal in all extremities. Finger-to-nose and heel-to-shin performed accurately bilaterally. Gait and Station: Arises from chair without difficulty. Stance is normal. Gait demonstrates normal stride length and balance Reflexes: 1+ and symmetric. Toes downgoing.     NIHSS  0 Modified Rankin  0     ASSESSMENT: Earl Mueller is a 84 y.o. year old male presented with expressive aphasia, right facial droop and right hemiparesis on 09/21/2019 with stroke work-up revealing multifocal  cerebral infarct s/p TPA, embolic pattern secondary to unclear source s/p loop recorder placement. Vascular risk factors include HTN, HLD, carotid artery stenosis, prior stroke, CAD s/p LAD stent 7/1 and Cfx stent 7/2, cardiomyopathy, advanced age, former tobacco use and EtOH use.      PLAN:  1. Multifocal cerebral infarct, cryptogenic:  a. Recovered well without residual deficits  b. loop recorder: Has not shown atrial fibrillation thus far.  Monthly downloads by cardiology.   c. Continue aspirin 81 mg daily and clopidogrel 75 mg daily  and atorvastatin 80 mg daily and fenofibrate for secondary stroke prevention.  d. Close PCP follow up for aggressive stroke risk factor management  2. Cardiomyopathy and CAD:  a. s/p LAD drug-eluting stent 7/1 and Cfx drug-eluting stent 7/2.  b. Per cardiology, continue DAPT for at least 1 year.   c. Continues to follow closely with cardiology 3. Carotid artery stenosis:  a. hx B CEA b. needs follow-up with vascular surgery Dr. Donnetta Hutching 4. HTN:  a. BP goal <130/90.  Stable.  Managed by PCP 5. HLD:  a. LDL goal <70. Recent LDL 63.   b. Continue atorvastatin and fenofibrate.  c. F/u with PCP for management as well as prescribing of statin     Follow up in 6 months or call earlier if needed   I spent 45 minutes of face-to-face and non-face-to-face time with patient and wife.  This included previsit chart review, lab review, study review, order entry, electronic health record documentation, patient education regarding recent stroke, importance of managing stroke risk factors and answered all questions to patient satisfaction     Frann Rider, [facility]  [facility] [address] [place], [place] [address]  Phone [phone] Fax [phone] Note: This document was prepared with digital dictation and possible smart phrase technology. Any transcriptional errors that result from this process are  unintentional.

## 2019-11-01 DIAGNOSIS — I2510 Atherosclerotic heart disease of native coronary artery without angina pectoris: Secondary | ICD-10-CM | POA: Diagnosis not present

## 2019-11-01 DIAGNOSIS — H5461 Unqualified visual loss, right eye, normal vision left eye: Secondary | ICD-10-CM | POA: Diagnosis not present

## 2019-11-01 DIAGNOSIS — I69328 Other speech and language deficits following cerebral infarction: Secondary | ICD-10-CM | POA: Diagnosis not present

## 2019-11-01 DIAGNOSIS — I69398 Other sequelae of cerebral infarction: Secondary | ICD-10-CM | POA: Diagnosis not present

## 2019-11-01 DIAGNOSIS — I69318 Other symptoms and signs involving cognitive functions following cerebral infarction: Secondary | ICD-10-CM | POA: Diagnosis not present

## 2019-11-01 DIAGNOSIS — S60211D Contusion of right wrist, subsequent encounter: Secondary | ICD-10-CM | POA: Diagnosis not present

## 2019-11-01 DIAGNOSIS — I69351 Hemiplegia and hemiparesis following cerebral infarction affecting right dominant side: Secondary | ICD-10-CM | POA: Diagnosis not present

## 2019-11-01 DIAGNOSIS — E876 Hypokalemia: Secondary | ICD-10-CM | POA: Diagnosis not present

## 2019-11-01 DIAGNOSIS — N179 Acute kidney failure, unspecified: Secondary | ICD-10-CM | POA: Diagnosis not present

## 2019-11-03 NOTE — Progress Notes (Signed)
I agree with the above plan 

## 2019-11-06 ENCOUNTER — Ambulatory Visit (INDEPENDENT_AMBULATORY_CARE_PROVIDER_SITE_OTHER): Payer: Medicare HMO | Admitting: *Deleted

## 2019-11-06 DIAGNOSIS — I639 Cerebral infarction, unspecified: Secondary | ICD-10-CM | POA: Diagnosis not present

## 2019-11-08 DIAGNOSIS — I69328 Other speech and language deficits following cerebral infarction: Secondary | ICD-10-CM | POA: Diagnosis not present

## 2019-11-08 DIAGNOSIS — I2510 Atherosclerotic heart disease of native coronary artery without angina pectoris: Secondary | ICD-10-CM | POA: Diagnosis not present

## 2019-11-08 DIAGNOSIS — I69351 Hemiplegia and hemiparesis following cerebral infarction affecting right dominant side: Secondary | ICD-10-CM | POA: Diagnosis not present

## 2019-11-08 DIAGNOSIS — E876 Hypokalemia: Secondary | ICD-10-CM | POA: Diagnosis not present

## 2019-11-08 DIAGNOSIS — H5461 Unqualified visual loss, right eye, normal vision left eye: Secondary | ICD-10-CM | POA: Diagnosis not present

## 2019-11-08 DIAGNOSIS — I69318 Other symptoms and signs involving cognitive functions following cerebral infarction: Secondary | ICD-10-CM | POA: Diagnosis not present

## 2019-11-08 DIAGNOSIS — I69398 Other sequelae of cerebral infarction: Secondary | ICD-10-CM | POA: Diagnosis not present

## 2019-11-08 DIAGNOSIS — S60211D Contusion of right wrist, subsequent encounter: Secondary | ICD-10-CM | POA: Diagnosis not present

## 2019-11-08 DIAGNOSIS — N179 Acute kidney failure, unspecified: Secondary | ICD-10-CM | POA: Diagnosis not present

## 2019-11-08 LAB — CUP PACEART REMOTE DEVICE CHECK
Date Time Interrogation Session: 20210824145652
Implantable Pulse Generator Implant Date: 20210712

## 2019-11-13 ENCOUNTER — Encounter: Payer: Self-pay | Admitting: Family Medicine

## 2019-11-13 NOTE — Progress Notes (Signed)
Carelink Summary Report / Loop Recorder 

## 2019-11-17 DIAGNOSIS — J449 Chronic obstructive pulmonary disease, unspecified: Secondary | ICD-10-CM | POA: Diagnosis not present

## 2019-11-23 DIAGNOSIS — H52223 Regular astigmatism, bilateral: Secondary | ICD-10-CM | POA: Diagnosis not present

## 2019-11-30 ENCOUNTER — Other Ambulatory Visit: Payer: Self-pay

## 2019-11-30 ENCOUNTER — Ambulatory Visit (INDEPENDENT_AMBULATORY_CARE_PROVIDER_SITE_OTHER): Payer: Medicare HMO | Admitting: Family Medicine

## 2019-11-30 ENCOUNTER — Ambulatory Visit: Payer: Medicare HMO

## 2019-11-30 VITALS — BP 140/78 | HR 72 | Temp 97.7°F | Ht 70.5 in | Wt 142.0 lb

## 2019-11-30 DIAGNOSIS — J439 Emphysema, unspecified: Secondary | ICD-10-CM | POA: Diagnosis not present

## 2019-11-30 DIAGNOSIS — Z23 Encounter for immunization: Secondary | ICD-10-CM | POA: Diagnosis not present

## 2019-11-30 DIAGNOSIS — Z0000 Encounter for general adult medical examination without abnormal findings: Secondary | ICD-10-CM | POA: Diagnosis not present

## 2019-11-30 NOTE — Patient Instructions (Addendum)
Patient Care Team: Rochel Brome, MD as PCP - General (Family Medicine) Revankar, Reita Cliche, MD as PCP - Cardiology (Cardiology) Burnice Logan, [facility] as Pharmacist (Pharmacist) Gaspar Bidding, Robert-Pulmonary-appt scheduled to see [facility] -2 inhalations twice a day-pt to keep appointment with pulmonary. Continue to use oxygen 4L/day until pulmonary evaluation

## 2019-11-30 NOTE — Progress Notes (Signed)
Subjective:   Earl Mueller is a 84 y.o. male who presents for Medicare Annual/Subsequent preventive examination.   multiple cardiac risk factors-CAD-PVD-h/o CVA  Concern for emphysema-no h/o of inhalers in the past. Pt using 3.5L of oxygen -reviewed chest xray- Objective:    Today's Vitals   11/30/19 1334  BP: 140/78  Pulse: 72  Temp: 97.7 F (36.5 C)  TempSrc: Temporal  SpO2: (!) 83%  Weight: 142 lb (64.4 kg)  Height: 5' 10.5" (1.791 m)  pts oxygen tank empty-95% on 4L Body mass index is 20.09 kg/m.  Advanced Directives 11/30/2019 09/13/2019 03/22/2017 02/03/2017 01/13/2017 05/19/2013  Does Patient Have a Medical Advance Directive? Yes No Yes Yes Yes Patient has advance directive, copy not in chart  Type of Advance Directive - - [place];Living will [place];Living will [place];Living will -  Does patient want to make changes to medical advance directive? Yes (Inpatient - patient defers changing a medical advance directive at this time - Information given) - No - Patient declined No - Patient declined - -  Copy of [place] in Chart? - - No - copy requested No - copy requested - -  Would patient like information on creating a medical advance directive? - No - Patient declined - - - -    Current Medications (verified) Outpatient Encounter Medications as of 11/30/2019  Medication Sig  . Acetaminophen 500 MG capsule Take 2 capsules (1,000 mg total) by mouth every 6 (six) hours as needed for fever or pain.  Marland Kitchen aspirin EC 81 MG tablet Take 81 mg by mouth daily.  Marland Kitchen atorvastatin (LIPITOR) 80 MG tablet TAKE 1 TABLET EVERY DAY (Patient taking differently: Take 80 mg by mouth daily. )  . cilostazol (PLETAL) 100 MG tablet Take 100 mg by mouth 2 (two) times daily.  . clopidogrel (PLAVIX) 75 MG tablet TAKE 1 TABLET EVERY DAY (Patient taking differently: Take 75 mg by mouth daily. )  . fenofibrate 160 MG tablet Take  160 mg by mouth daily.  Marland Kitchen levothyroxine (SYNTHROID) 112 MCG tablet Take 1 tablet (112 mcg total) by mouth daily.  Marland Kitchen losartan (COZAAR) 25 MG tablet Take 1 tablet (25 mg total) by mouth daily. (Patient taking differently: Take 12.5 mg by mouth daily. )  . metoprolol succinate (TOPROL-XL) 25 MG 24 hr tablet Take 1 tablet (25 mg total) by mouth daily.  . Multiple Vitamins-Minerals (CENTRUM SILVER PO) Take 1 tablet by mouth daily.  . nitroGLYCERIN (NITROSTAT) 0.4 MG SL tablet Place 0.4 mg under the tongue every 5 (five) minutes as needed for chest pain.  Marland Kitchen PARoxetine (PAXIL) 20 MG tablet TAKE 1 TABLET EVERY DAY   No facility-administered encounter medications on file as of 11/30/2019.    Allergies (verified) Other   History: Past Medical History:  Diagnosis Date  . Anxiety   . Arthritis   . Cancer ([place])    skin  . Carotid artery stenosis 03/26/2017  . Coronary artery disease involving native coronary artery of native heart without angina pectoris 11/20/2014  . Dyslipidemia 11/20/2014  . Essential hypertension 11/20/2014   Dr Geraldo Pitter, [facility]  . Hypothyroidism   . Peripheral vascular disease ([place])   . Skin ulcer of scalp ([place]) 05/10/2013  . Symptomatic carotid artery stenosis 03/05/2017   Past Surgical History:  Procedure Laterality Date  . BACK SURGERY    . CARDIAC CATHETERIZATION  03/06/10   NL-LM, 40%pLAD, 30% mid/distal LAD, 98% ostial DIAG (small),  30%pCx, 50%mCx, 30%dCx, 30% OM2, 40%dRCA; Med RX rec ([facility])  . caritid endart    . CORONARY ATHERECTOMY N/A 09/14/2019   Procedure: CORONARY ATHERECTOMY;  Surgeon: Martinique, Peter M, MD;  Location: [place] [facility];  Service: Cardiovascular;  Laterality: N/A;  . CORONARY ATHERECTOMY N/A 09/15/2019   Procedure: CORONARY ATHERECTOMY;  Surgeon: Martinique, Peter M, MD;  Location: [place] [facility] [place] [facility];  Service: Cardiovascular;  Laterality: N/A;  . CORONARY STENT INTERVENTION N/A 09/14/2019   Procedure: CORONARY STENT INTERVENTION;  Surgeon: Martinique,  Peter M, MD;  Location: [place];  Service: Cardiovascular;  Laterality: N/A;  . CORONARY STENT INTERVENTION N/A 09/15/2019   Procedure: CORONARY STENT INTERVENTION;  Surgeon: Martinique, Peter M, MD;  Location: [facility];  Service: Cardiovascular;  Laterality: N/A;  . ENDARTERECTOMY Left 03/26/2017   Procedure: ENDARTERECTOMY CAROTID LEFT;  Surgeon: Rosetta Posner, MD;  Location: [facility];  Service: Vascular;  Laterality: Left;  . femerao bypass    . LEFT HEART CATH AND CORONARY ANGIOGRAPHY N/A 02/03/2017   Procedure: LEFT HEART CATH AND CORONARY ANGIOGRAPHY;  Surgeon: Martinique, Peter M, MD;  Location: [place] [facility];  Service: Cardiovascular;  Laterality: N/A;  . LEFT HEART CATH AND CORONARY ANGIOGRAPHY N/A 09/14/2019   Procedure: LEFT HEART CATH AND CORONARY ANGIOGRAPHY;  Surgeon: Martinique, Peter M, MD;  Location: [place] [facility];  Service: Cardiovascular;  Laterality: N/A;  . LOOP RECORDER INSERTION N/A 09/25/2019   Procedure: LOOP RECORDER INSERTION;  Surgeon: Deboraha Sprang, MD;  Location: [place] [facility];  Service: Cardiovascular;  Laterality: N/A;  . PATCH ANGIOPLASTY Left 03/26/2017   Procedure: PATCH ANGIOPLASTY LEFT CAROTID ARTERY;  Surgeon: Rosetta Posner, MD;  Location: [facility];  Service: Vascular;  Laterality: Left;  . SCALP LACERATION REPAIR N/A 05/23/2013   Procedure: EXCISION SCALP ULCER DEBRIDEMENT OF SKIN AND BONE WITH PLACEMENT OF A-CELL;  Surgeon: Irene Limbo, MD;  Location: [place];  Service: Plastics;  Laterality: N/A;  . STOMACH SURGERY     x2  . VASCULAR SURGERY     AFBG 10/15/05   Family History  Problem Relation Age of Onset  . Heart failure Father    Social History   Socioeconomic History  . Marital status: Married    Spouse name: Not on file  . Number of children: Not on file  . Years of education: Not on file  . Highest education level: Not on file  Occupational History  . Not on file  Tobacco Use  . Smoking status: Former Smoker    Quit  date: 12/18/2007    Years since quitting: 11.9  . Smokeless tobacco: Never Used  Vaping Use  . Vaping Use: Never used  Substance and Sexual Activity  . Alcohol use: Yes    Comment: occ  . Drug use: No  . Sexual activity: Not on file  Other Topics Concern  . Not on file  Social History Narrative  . Not on file   Social Determinants of Health   Financial Resource Strain:   . Difficulty of Paying Living Expenses: Not on file  Food Insecurity: No Food Insecurity  . Worried About Charity fundraiser in the Last Year: Never true  . Ran Out of Food in the Last Year: Never true  Transportation Needs: No Transportation Needs  . Lack of Transportation (Medical): No  . Lack of Transportation (Non-Medical): No  Physical Activity: Inactive  . Days of Exercise per Week: 0 days  .  Minutes of Exercise per Session: 0 min  Stress:   . Feeling of Stress : Not on file  Social Connections:   . Frequency of Communication with Friends and Family: Not on file  . Frequency of Social Gatherings with Friends and Family: Not on file  . Attends Religious Services: Not on file  . Active Member of Clubs or Organizations: Not on file  . Attends Archivist Meetings: Not on file  . Marital Status: Not on file    Tobacco Counseling quit 2009         Activities of Daily Living Able to complete ADL-cooking, showering-currently no driving due to CVA Patient Care Team: Rochel Brome, MD as PCP - General (Family Medicine) Revankar, Reita Cliche, MD as PCP - Cardiology (Cardiology) Revankar, Reita Cliche, MD as Consulting Physician (Cardiology) Burnice Logan, [facility] as Pharmacist (Pharmacist)    Assessment:   This is a routine wellness examination for Chloe.  Hearing/Vision screen Cataract surgery/Hearing problems-aids Dietary issues and exercise activities discussed: Current Exercise Habits: The patient does not participate in regular exercise at present   Depression Screen PHQ 2/9 Scores  11/30/2019 08/07/2019  PHQ - 2 Score 0 0    Fall Risk Fall Risk  11/30/2019  Falls in the past year? 0  Number falls in past yr: 0  Injury with Fall? 0    Any stairs in or around the home? yes If so, are there any without handrails? No rails Home free of loose throw rugs in walkways, pet beds, electrical cords, etc? yes Adequate lighting in your home to reduce risk of falls?yes  ASSISTIVE DEVICES UTILIZED TO PREVENT FALLS:  Life alert? no Use of a cane, walker or w/c? no Grab bars in the bathroom?yes Shower chair or bench in shower? yes Elevated toilet seat or a handicapped toilet?no    Cognitive Function:  no concern      Immunizations Immunization History  Administered Date(s) Administered  . Influenza-Unspecified 12/18/2016  . Pneumococcal Polysaccharide-23 10/25/2008, 09/15/2019  . Tdap 01/15/2016  . Zoster Recombinat (Shingrix) 06/12/2019    Qualifies for Shingles Vaccine Completed at [facility] Maintenance  Topic Date Due  . COVID-19 Vaccine (1) Never done  . INFLUENZA VACCINE  10/15/2019  . PNA vac Low Risk Adult (2 of 2 - PCV13) 09/14/2020  . TETANUS/TDAP  01/14/2026    Health Maintenance  Health Maintenance Due  Topic Date Due  . COVID-19 Vaccine (1) Never done  . INFLUENZA VACCINE  10/15/2019    Additional Screening:COVID completed 2/21-influenza today  Vision Screening:cataract surgery Is the patient up to date with their annual eye exam?  yesDental Screening: Recommended annual dental exams for proper oral hygiene   Plan:    I have personally reviewed and noted the following in the patient's chart:   . Medical and social history . Use of alcohol, tobacco or illicit drugs  . Current medications and supplements . Functional ability and status . Nutritional status . Physical activity . Advanced directives . List of other physicians . Hospitalizations, surgeries, and ER visits in previous 12 months . Vitals .  Screenings to include cognitive, depression, and falls . Referrals and appointments-keep pulmonary appt  In addition, I have reviewed and discussed with patient certain preventive protocols, quality metrics, and best practice recommendations. A written personalized care plan for preventive services as well as general preventive health recommendations were provided to patient.     Mertha Baars, MD   11/30/2019

## 2019-12-02 DIAGNOSIS — Z0000 Encounter for general adult medical examination without abnormal findings: Secondary | ICD-10-CM | POA: Insufficient documentation

## 2019-12-02 DIAGNOSIS — Z23 Encounter for immunization: Secondary | ICD-10-CM | POA: Insufficient documentation

## 2019-12-02 HISTORY — DX: Encounter for general adult medical examination without abnormal findings: Z00.00

## 2019-12-02 HISTORY — DX: Encounter for immunization: Z23

## 2019-12-02 MED ORDER — BREZTRI AEROSPHERE 160-9-4.8 MCG/ACT IN AERO: 2.0000 | INHALATION_SPRAY | Freq: Two times a day (BID) | RESPIRATORY_TRACT | 0 refills | Status: DC

## 2019-12-05 ENCOUNTER — Encounter: Payer: Self-pay | Admitting: Family Medicine

## 2019-12-05 ENCOUNTER — Encounter (HOSPITAL_COMMUNITY): Payer: Self-pay | Admitting: Internal Medicine

## 2019-12-05 ENCOUNTER — Emergency Department (HOSPITAL_COMMUNITY): Payer: Medicare HMO

## 2019-12-05 ENCOUNTER — Ambulatory Visit (INDEPENDENT_AMBULATORY_CARE_PROVIDER_SITE_OTHER): Payer: Medicare HMO | Admitting: Family Medicine

## 2019-12-05 ENCOUNTER — Other Ambulatory Visit: Payer: Self-pay

## 2019-12-05 ENCOUNTER — Inpatient Hospital Stay (HOSPITAL_COMMUNITY)
Admission: EM | Admit: 2019-12-05 | Discharge: 2019-12-09 | DRG: 291 | Disposition: A | Discharge status: Home Health | Payer: Medicare HMO | Attending: Internal Medicine | Admitting: Internal Medicine

## 2019-12-05 ENCOUNTER — Other Ambulatory Visit: Payer: Self-pay | Admitting: Physician Assistant

## 2019-12-05 VITALS — BP 140/84 | HR 84 | Resp 22

## 2019-12-05 DIAGNOSIS — I130 Hypertensive heart and chronic kidney disease with heart failure and stage 1 through stage 4 chronic kidney disease, or unspecified chronic kidney disease: Principal | ICD-10-CM | POA: Diagnosis present

## 2019-12-05 DIAGNOSIS — D72829 Elevated white blood cell count, unspecified: Secondary | ICD-10-CM | POA: Diagnosis present

## 2019-12-05 DIAGNOSIS — N179 Acute kidney failure, unspecified: Secondary | ICD-10-CM | POA: Diagnosis not present

## 2019-12-05 DIAGNOSIS — Z7982 Long term (current) use of aspirin: Secondary | ICD-10-CM

## 2019-12-05 DIAGNOSIS — E039 Hypothyroidism, unspecified: Secondary | ICD-10-CM | POA: Diagnosis present

## 2019-12-05 DIAGNOSIS — Z955 Presence of coronary angioplasty implant and graft: Secondary | ICD-10-CM | POA: Diagnosis not present

## 2019-12-05 DIAGNOSIS — J9611 Chronic respiratory failure with hypoxia: Secondary | ICD-10-CM

## 2019-12-05 DIAGNOSIS — M1990 Unspecified osteoarthritis, unspecified site: Secondary | ICD-10-CM | POA: Diagnosis present

## 2019-12-05 DIAGNOSIS — I5043 Acute on chronic combined systolic (congestive) and diastolic (congestive) heart failure: Secondary | ICD-10-CM | POA: Diagnosis present

## 2019-12-05 DIAGNOSIS — Z681 Body mass index (BMI) 19 or less, adult: Secondary | ICD-10-CM | POA: Diagnosis not present

## 2019-12-05 DIAGNOSIS — Z87891 Personal history of nicotine dependence: Secondary | ICD-10-CM | POA: Diagnosis not present

## 2019-12-05 DIAGNOSIS — I69359 Hemiplegia and hemiparesis following cerebral infarction affecting unspecified side: Secondary | ICD-10-CM

## 2019-12-05 DIAGNOSIS — I255 Ischemic cardiomyopathy: Secondary | ICD-10-CM | POA: Diagnosis present

## 2019-12-05 DIAGNOSIS — N1831 Chronic kidney disease, stage 3a: Secondary | ICD-10-CM | POA: Diagnosis present

## 2019-12-05 DIAGNOSIS — I509 Heart failure, unspecified: Secondary | ICD-10-CM

## 2019-12-05 DIAGNOSIS — E785 Hyperlipidemia, unspecified: Secondary | ICD-10-CM | POA: Diagnosis present

## 2019-12-05 DIAGNOSIS — Z9981 Dependence on supplemental oxygen: Secondary | ICD-10-CM | POA: Diagnosis not present

## 2019-12-05 DIAGNOSIS — J438 Other emphysema: Secondary | ICD-10-CM

## 2019-12-05 DIAGNOSIS — I69319 Unspecified symptoms and signs involving cognitive functions following cerebral infarction: Secondary | ICD-10-CM | POA: Diagnosis not present

## 2019-12-05 DIAGNOSIS — R0600 Dyspnea, unspecified: Secondary | ICD-10-CM | POA: Diagnosis not present

## 2019-12-05 DIAGNOSIS — I2510 Atherosclerotic heart disease of native coronary artery without angina pectoris: Secondary | ICD-10-CM

## 2019-12-05 DIAGNOSIS — Z20822 Contact with and (suspected) exposure to covid-19: Secondary | ICD-10-CM | POA: Diagnosis not present

## 2019-12-05 DIAGNOSIS — F329 Major depressive disorder, single episode, unspecified: Secondary | ICD-10-CM | POA: Diagnosis present

## 2019-12-05 DIAGNOSIS — Z885 Allergy status to narcotic agent status: Secondary | ICD-10-CM

## 2019-12-05 DIAGNOSIS — Z79890 Hormone replacement therapy (postmenopausal): Secondary | ICD-10-CM

## 2019-12-05 DIAGNOSIS — I69328 Other speech and language deficits following cerebral infarction: Secondary | ICD-10-CM

## 2019-12-05 DIAGNOSIS — Z7902 Long term (current) use of antithrombotics/antiplatelets: Secondary | ICD-10-CM

## 2019-12-05 DIAGNOSIS — J439 Emphysema, unspecified: Secondary | ICD-10-CM | POA: Diagnosis present

## 2019-12-05 DIAGNOSIS — R000 Tachycardia, unspecified: Secondary | ICD-10-CM | POA: Diagnosis not present

## 2019-12-05 DIAGNOSIS — I119 Hypertensive heart disease without heart failure: Secondary | ICD-10-CM

## 2019-12-05 DIAGNOSIS — I10 Essential (primary) hypertension: Secondary | ICD-10-CM | POA: Diagnosis not present

## 2019-12-05 DIAGNOSIS — J9601 Acute respiratory failure with hypoxia: Secondary | ICD-10-CM

## 2019-12-05 DIAGNOSIS — I5021 Acute systolic (congestive) heart failure: Secondary | ICD-10-CM | POA: Diagnosis not present

## 2019-12-05 DIAGNOSIS — I69351 Hemiplegia and hemiparesis following cerebral infarction affecting right dominant side: Secondary | ICD-10-CM | POA: Diagnosis not present

## 2019-12-05 DIAGNOSIS — E440 Moderate protein-calorie malnutrition: Secondary | ICD-10-CM | POA: Insufficient documentation

## 2019-12-05 DIAGNOSIS — I110 Hypertensive heart disease with heart failure: Secondary | ICD-10-CM | POA: Diagnosis not present

## 2019-12-05 DIAGNOSIS — Z85828 Personal history of other malignant neoplasm of skin: Secondary | ICD-10-CM | POA: Diagnosis not present

## 2019-12-05 DIAGNOSIS — I739 Peripheral vascular disease, unspecified: Secondary | ICD-10-CM | POA: Diagnosis not present

## 2019-12-05 DIAGNOSIS — J9621 Acute and chronic respiratory failure with hypoxia: Secondary | ICD-10-CM | POA: Diagnosis present

## 2019-12-05 DIAGNOSIS — I25119 Atherosclerotic heart disease of native coronary artery with unspecified angina pectoris: Secondary | ICD-10-CM | POA: Diagnosis present

## 2019-12-05 DIAGNOSIS — R0902 Hypoxemia: Secondary | ICD-10-CM | POA: Diagnosis not present

## 2019-12-05 DIAGNOSIS — T502X5A Adverse effect of carbonic-anhydrase inhibitors, benzothiadiazides and other diuretics, initial encounter: Secondary | ICD-10-CM | POA: Diagnosis not present

## 2019-12-05 DIAGNOSIS — I69320 Aphasia following cerebral infarction: Secondary | ICD-10-CM | POA: Diagnosis not present

## 2019-12-05 DIAGNOSIS — R0602 Shortness of breath: Secondary | ICD-10-CM | POA: Diagnosis not present

## 2019-12-05 DIAGNOSIS — Z8249 Family history of ischemic heart disease and other diseases of the circulatory system: Secondary | ICD-10-CM

## 2019-12-05 DIAGNOSIS — J90 Pleural effusion, not elsewhere classified: Secondary | ICD-10-CM | POA: Diagnosis not present

## 2019-12-05 DIAGNOSIS — Z79899 Other long term (current) drug therapy: Secondary | ICD-10-CM

## 2019-12-05 DIAGNOSIS — I639 Cerebral infarction, unspecified: Secondary | ICD-10-CM | POA: Diagnosis not present

## 2019-12-05 HISTORY — DX: Ischemic cardiomyopathy: I25.5

## 2019-12-05 HISTORY — DX: Chronic respiratory failure, unspecified whether with hypoxia or hypercapnia: J96.10

## 2019-12-05 HISTORY — DX: Chronic obstructive pulmonary disease, unspecified: J44.9

## 2019-12-05 HISTORY — DX: Atherosclerotic heart disease of native coronary artery without angina pectoris: I25.10

## 2019-12-05 HISTORY — DX: Acute and chronic respiratory failure with hypoxia: J96.21

## 2019-12-05 HISTORY — DX: Chronic kidney disease, stage 3 unspecified: N18.30

## 2019-12-05 HISTORY — DX: Personal history of nicotine dependence: Z87.891

## 2019-12-05 HISTORY — DX: Heart failure, unspecified: I50.9

## 2019-12-05 HISTORY — DX: Chronic combined systolic (congestive) and diastolic (congestive) heart failure: I50.42

## 2019-12-05 HISTORY — DX: Acute on chronic combined systolic (congestive) and diastolic (congestive) heart failure: I50.43

## 2019-12-05 HISTORY — DX: Elevated white blood cell count, unspecified: D72.829

## 2019-12-05 HISTORY — DX: Cerebral infarction, unspecified: I63.9

## 2019-12-05 LAB — I-STAT VENOUS BLOOD GAS, ED
Acid-Base Excess: 3 mmol/L — ABNORMAL HIGH (ref 0.0–2.0)
Bicarbonate: 26.8 mmol/L (ref 20.0–28.0)
Calcium, Ion: 0.98 mmol/L — ABNORMAL LOW (ref 1.15–1.40)
HCT: 45 % (ref 39.0–52.0)
Hemoglobin: 15.3 g/dL (ref 13.0–17.0)
O2 Saturation: 84 %
Potassium: 4.9 mmol/L (ref 3.5–5.1)
Sodium: 140 mmol/L (ref 135–145)
TCO2: 28 mmol/L (ref 22–32)
pCO2, Ven: 38.8 mmHg — ABNORMAL LOW (ref 44.0–60.0)
pH, Ven: 7.447 — ABNORMAL HIGH (ref 7.250–7.430)
pO2, Ven: 47 mmHg — ABNORMAL HIGH (ref 32.0–45.0)

## 2019-12-05 LAB — URINALYSIS, ROUTINE W REFLEX MICROSCOPIC
Bilirubin Urine: NEGATIVE
Glucose, UA: NEGATIVE mg/dL
Ketones, ur: NEGATIVE mg/dL
Leukocytes,Ua: NEGATIVE
Nitrite: NEGATIVE
Protein, ur: NEGATIVE mg/dL
Specific Gravity, Urine: 1.009 (ref 1.005–1.030)
pH: 7 (ref 5.0–8.0)

## 2019-12-05 LAB — BASIC METABOLIC PANEL
Anion gap: 10 (ref 5–15)
BUN: 18 mg/dL (ref 8–23)
CO2: 24 mmol/L (ref 22–32)
Calcium: 9.3 mg/dL (ref 8.9–10.3)
Chloride: 107 mmol/L (ref 98–111)
Creatinine, Ser: 1.27 mg/dL — ABNORMAL HIGH (ref 0.61–1.24)
GFR calc Af Amer: >60 mL/min (ref >60)
GFR calc non Af Amer: 52 mL/min — ABNORMAL LOW (ref >60)
Glucose, Bld: 88 mg/dL (ref 70–99)
Potassium: 4 mmol/L (ref 3.5–5.1)
Sodium: 141 mmol/L (ref 135–145)

## 2019-12-05 LAB — CBC WITH DIFFERENTIAL/PLATELET
Abs Immature Granulocytes: 0.1 10*3/uL — ABNORMAL HIGH (ref 0.00–0.07)
Basophils Absolute: 0.1 10*3/uL (ref 0.0–0.1)
Basophils Relative: 1 %
Eosinophils Absolute: 0.4 10*3/uL (ref 0.0–0.5)
Eosinophils Relative: 4 %
HCT: 48.5 % (ref 39.0–52.0)
Hemoglobin: 15.6 g/dL (ref 13.0–17.0)
Immature Granulocytes: 1 %
Lymphocytes Relative: 12 %
Lymphs Abs: 1.4 10*3/uL (ref 0.7–4.0)
MCH: 33 pg (ref 26.0–34.0)
MCHC: 32.2 g/dL (ref 30.0–36.0)
MCV: 102.5 fL — ABNORMAL HIGH (ref 80.0–100.0)
Monocytes Absolute: 1.1 10*3/uL — ABNORMAL HIGH (ref 0.1–1.0)
Monocytes Relative: 9 %
Neutro Abs: 9 10*3/uL — ABNORMAL HIGH (ref 1.7–7.7)
Neutrophils Relative %: 73 %
Platelets: 316 10*3/uL (ref 150–400)
RBC: 4.73 MIL/uL (ref 4.22–5.81)
RDW: 16.6 % — ABNORMAL HIGH (ref 11.5–15.5)
WBC: 12.1 10*3/uL — ABNORMAL HIGH (ref 4.0–10.5)
nRBC: 0 % (ref 0.0–0.2)

## 2019-12-05 LAB — TROPONIN I (HIGH SENSITIVITY)
Troponin I (High Sensitivity): 12 ng/L (ref <18)
Troponin I (High Sensitivity): 10 ng/L (ref <18)
Troponin I (High Sensitivity): 12 ng/L (ref <18)

## 2019-12-05 LAB — PROCALCITONIN: Procalcitonin: <0.1 ng/mL

## 2019-12-05 LAB — BRAIN NATRIURETIC PEPTIDE: B Natriuretic Peptide: 1176.6 pg/mL — ABNORMAL HIGH (ref 0.0–100.0)

## 2019-12-05 LAB — LACTIC ACID, PLASMA: Lactic Acid, Venous: 1.7 mmol/L (ref 0.5–1.9)

## 2019-12-05 LAB — SARS CORONAVIRUS 2 BY RT PCR (HOSPITAL ORDER, PERFORMED IN ~~LOC~~ HOSPITAL LAB): SARS Coronavirus 2: NEGATIVE

## 2019-12-05 LAB — POC COVID19 BINAXNOW: SARS Coronavirus 2 Ag: NEGATIVE

## 2019-12-05 MED ORDER — ONDANSETRON HCL 4 MG/2ML IJ SOLN: 4.0000 mg | Freq: Four times a day (QID) | INTRAMUSCULAR | Status: DC | PRN

## 2019-12-05 MED ORDER — SODIUM CHLORIDE 0.9% FLUSH
3.0000 mL | Freq: Two times a day (BID) | INTRAVENOUS | Status: DC
  Administered 2019-12-05 – 2019-12-09 (×7): 3 mL via INTRAVENOUS

## 2019-12-05 MED ORDER — ALBUTEROL SULFATE (2.5 MG/3ML) 0.083% IN NEBU: 2.5000 mg | INHALATION_SOLUTION | RESPIRATORY_TRACT | Status: DC | PRN

## 2019-12-05 MED ORDER — SODIUM CHLORIDE 0.9 % IV SOLN: 250.0000 mL | INTRAVENOUS | Status: DC | PRN

## 2019-12-05 MED ORDER — ACETAMINOPHEN 650 MG RE SUPP: 650.0000 mg | Freq: Four times a day (QID) | RECTAL | Status: DC | PRN

## 2019-12-05 MED ORDER — SODIUM CHLORIDE 0.9% FLUSH
3.0000 mL | Freq: Two times a day (BID) | INTRAVENOUS | Status: DC
  Administered 2019-12-05 – 2019-12-08 (×7): 3 mL via INTRAVENOUS

## 2019-12-05 MED ORDER — IOHEXOL 350 MG/ML SOLN
75.0000 mL | Freq: Once | INTRAVENOUS | Status: AC | PRN
  Administered 2019-12-05: 75 mL via INTRAVENOUS

## 2019-12-05 MED ORDER — LOSARTAN POTASSIUM 25 MG PO TABS: 12.5000 mg | ORAL_TABLET | Freq: Every day | ORAL | Status: DC

## 2019-12-05 MED ORDER — FUROSEMIDE 10 MG/ML IJ SOLN
40.0000 mg | Freq: Two times a day (BID) | INTRAMUSCULAR | Status: DC
  Administered 2019-12-06 – 2019-12-07 (×3): 40 mg via INTRAVENOUS
  Filled 2019-12-05 (×3): qty 4

## 2019-12-05 MED ORDER — HYDROCODONE-ACETAMINOPHEN 5-325 MG PO TABS: 1.0000 | ORAL_TABLET | ORAL | Status: DC | PRN

## 2019-12-05 MED ORDER — FENOFIBRATE 160 MG PO TABS
160.0000 mg | ORAL_TABLET | Freq: Every day | ORAL | Status: DC
  Administered 2019-12-06 – 2019-12-09 (×4): 160 mg via ORAL
  Filled 2019-12-05 (×4): qty 1

## 2019-12-05 MED ORDER — PAROXETINE HCL 20 MG PO TABS
20.0000 mg | ORAL_TABLET | Freq: Every day | ORAL | Status: DC
  Administered 2019-12-06 – 2019-12-09 (×4): 20 mg via ORAL
  Filled 2019-12-05 (×5): qty 1

## 2019-12-05 MED ORDER — ONDANSETRON HCL 4 MG PO TABS: 4.0000 mg | ORAL_TABLET | Freq: Four times a day (QID) | ORAL | Status: DC | PRN

## 2019-12-05 MED ORDER — ENOXAPARIN SODIUM 40 MG/0.4ML ~~LOC~~ SOLN
40.0000 mg | SUBCUTANEOUS | Status: DC
  Administered 2019-12-05 – 2019-12-06 (×2): 40 mg via SUBCUTANEOUS
  Filled 2019-12-05 (×2): qty 0.4

## 2019-12-05 MED ORDER — ASPIRIN EC 81 MG PO TBEC
81.0000 mg | DELAYED_RELEASE_TABLET | Freq: Every day | ORAL | Status: DC
  Administered 2019-12-06 – 2019-12-09 (×4): 81 mg via ORAL
  Filled 2019-12-05 (×5): qty 1

## 2019-12-05 MED ORDER — LEVOTHYROXINE SODIUM 112 MCG PO TABS
112.0000 ug | ORAL_TABLET | Freq: Every day | ORAL | Status: DC
  Administered 2019-12-06 – 2019-12-08 (×3): 112 ug via ORAL
  Filled 2019-12-05 (×3): qty 1

## 2019-12-05 MED ORDER — ATORVASTATIN CALCIUM 80 MG PO TABS
80.0000 mg | ORAL_TABLET | Freq: Every day | ORAL | Status: DC
  Administered 2019-12-06 – 2019-12-09 (×4): 80 mg via ORAL
  Filled 2019-12-05: qty 2
  Filled 2019-12-05 (×4): qty 1

## 2019-12-05 MED ORDER — ACETAMINOPHEN 325 MG PO TABS: 650.0000 mg | ORAL_TABLET | Freq: Four times a day (QID) | ORAL | Status: DC | PRN

## 2019-12-05 MED ORDER — SODIUM CHLORIDE 0.9% FLUSH: 3.0000 mL | INTRAVENOUS | Status: DC | PRN

## 2019-12-05 MED ORDER — FUROSEMIDE 10 MG/ML IJ SOLN
40.0000 mg | Freq: Once | INTRAMUSCULAR | Status: AC
  Administered 2019-12-05: 40 mg via INTRAVENOUS
  Filled 2019-12-05: qty 4

## 2019-12-05 MED ORDER — CLOPIDOGREL BISULFATE 75 MG PO TABS
75.0000 mg | ORAL_TABLET | Freq: Every day | ORAL | Status: DC
  Administered 2019-12-06 – 2019-12-09 (×4): 75 mg via ORAL
  Filled 2019-12-05 (×5): qty 1

## 2019-12-05 MED ORDER — METOPROLOL SUCCINATE ER 25 MG PO TB24
25.0000 mg | ORAL_TABLET | Freq: Every day | ORAL | Status: DC
  Administered 2019-12-06 – 2019-12-09 (×4): 25 mg via ORAL
  Filled 2019-12-05 (×4): qty 1

## 2019-12-05 NOTE — Plan of Care (Signed)
  Problem: Education: Goal: Knowledge of General Education information will improve Description Including pain rating scale, medication(s)/side effects and non-pharmacologic comfort measures Outcome: Progressing   

## 2019-12-05 NOTE — ED Notes (Signed)
Venous blood gas not crossing over.  Ph 7.447 pc02 38.8 p02 47 Be, b 3 hc03 26.8 tc02 28 s02 84%  Na 140 k 4.9 ica 0.98 hct 45 Hb 15.3

## 2019-12-05 NOTE — H&P (Signed)
Earl Mueller NFA:[id number] DOB: 19-Oct-1935 DOA: 12/05/2019     PCP: Rochel Brome, MD   Outpatient Specialists:   CARDS:  Dr. Caryl Comes   Patient arrived to ER on 12/05/19 at 1611 Referred by Attending Lucrezia Starch, MD   Patient coming from: home Lives With family  Chief Complaint:   Chief Complaint  Patient presents with  . Shortness of Breath    HPI: Earl Mueller is a 84 y.o. 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  He has not been having any chest pain no cough Reports no leg edema, have been eating salty food No wheezing no cough, no cp, reports have been using his o2  Former smoker Quit 12 years ago  Drinks few beers a day no withdrawal  Infectious risk factors:  Reports shortness of breath  Has  been vaccinated against COVID in Feb   Initial COVID TEST  NEGATIVE  Lab Results  Component Value Date   [place] 12/05/2019   [place] NEGATIVE 09/21/2019   [place] NEGATIVE 09/13/2019   Regarding pertinent Chronic problems:     Hyperlipidemia -  on statins Lipitor 80 mg a day fenofibrate  Lipid Panel     Component Value Date/Time   CHOL 124 09/23/2019 0257   CHOL 148 07/05/2019 1003   TRIG 52 09/23/2019 0257   HDL 51 09/23/2019 0257   HDL 56 07/05/2019 1003   CHOLHDL 2.4 09/23/2019 0257   VLDL 10 09/23/2019 0257   LDLCALC 63 09/23/2019 0257   LDLCALC 79 07/05/2019 1003   LABVLDL 13 07/05/2019 1003     HTN on cozaar   chronic CHF diastolic/systolic/ combined - last echo July  2021 EF is 35 to 40%. Grade 1 diastolic CHF Not on diuretics    CAD  - On Aspirin, statin,  Plavix was cardiac catheterization July 2021 coronary stent placed by Peter Martinique                 Hypothyroidism:  Lab Results  Component Value Date   TSH 1.870 10/16/2019   on synthroid     COPD - not  followed by pulmonology  on baseline oxygen  3L,       Hx of CVA -  With  residual deficits on Aspirin 81 mg,  Plavix     While in ER: Emergency department initially noted to be hypertensive blood pressure up to 191/65 CT negative for PE bilateral pleural effusions pulmonary edema COPD Troponin 10    Hospitalist was called for admission for CHF exacerbation  The following Work up has been ordered so far:  Orders Placed This Encounter  Procedures  . SARS Coronavirus 2 by RT PCR (hospital order, performed in [facility] hospital lab) Nasopharyngeal Nasopharyngeal Swab  . DG Chest Portable 1 View  . CT Angio Chest PE W and/or Wo Contrast  . CBC with Differential  . Basic metabolic panel  . Brain natriuretic peptide  . Consult to hospitalist  ALL PATIENTS BEING ADMITTED/HAVING PROCEDURES NEED COVID-19 SCREENING  . EKG 12-Lead    Following Medications were ordered in ER: Medications  iohexol (OMNIPAQUE) 350 MG/ML injection 75 mL (75 mLs Intravenous Contrast Given 12/05/19 1903)  furosemide (LASIX) injection 40 mg (40 mg Intravenous Given 12/05/19 1941)        Consult Orders  (From admission, onward)         Start     Ordered   12/05/19 1932  Consult to hospitalist  ALL PATIENTS BEING ADMITTED/HAVING PROCEDURES NEED COVID-19 SCREENING Paged by Starleen Blue  Once       Comments: ALL PATIENTS BEING ADMITTED/HAVING PROCEDURES NEED COVID-19 SCREENING  Provider:  (Not yet assigned)  Question Answer Comment  Place call to: [facility]   Reason for Consult Admit      12/05/19 1931         Significant initial  Findings: Abnormal Labs Reviewed  CBC WITH DIFFERENTIAL/PLATELET -  Abnormal; Notable for the following components:      Result Value   WBC 12.1 (*)    MCV 102.5 (*)    RDW 16.6 (*)    Neutro Abs 9.0 (*)    Monocytes Absolute 1.1 (*)    Abs Immature Granulocytes 0.10 (*)    All other components within normal limits  BASIC METABOLIC PANEL - Abnormal; Notable for the following components:   Creatinine, Ser 1.27 (*)    GFR calc non Af Amer 52 (*)    All other components within normal limits  BRAIN NATRIURETIC PEPTIDE - Abnormal; Notable for the following components:   B Natriuretic Peptide 1,176.6 (*)    All other components within normal limits    Otherwise labs showing:    Recent Labs  Lab 12/05/19 1626  NA 141  K 4.0  CO2 24  GLUCOSE 88  BUN 18  CREATININE 1.27*  CALCIUM 9.3   Cr   stable,   Lab Results  Component Value Date   CREATININE 1.27 (H) 12/05/2019   CREATININE 1.27 10/16/2019   CREATININE 1.25 10/02/2019    No results for input(s): AST, ALT, ALKPHOS, BILITOT, PROT, ALBUMIN in the last 168 hours. Lab Results  Component Value Date   CALCIUM 9.3 12/05/2019   PHOS 3.6 03/26/2017   WBC      Component Value Date/Time   WBC 12.1 (H) 12/05/2019 1626   LYMPHSABS 1.4 12/05/2019 1626   LYMPHSABS 1.7 10/16/2019 0834   MONOABS 1.1 (H) 12/05/2019 1626   EOSABS 0.4 12/05/2019 1626   EOSABS 0.5 (H) 10/16/2019 0834   BASOSABS 0.1 12/05/2019 1626   BASOSABS 0.1 10/16/2019 0834    Plt: Lab Results  Component Value Date   PLT 316 12/05/2019      Procalcitonin Ordered   COVID-19 Labs  No results for input(s): DDIMER, FERRITIN, LDH, CRP in the last 72 hours.  Lab Results  Component Value Date   SARSCOV2NAA NEGATIVE 12/05/2019   SARSCOV2NAA NEGATIVE 09/21/2019   SARSCOV2NAA NEGATIVE 09/13/2019    Venous  Blood Gas result:  pH 7.447  PCO2  38.8   ABG    Component Value Date/Time   HCO3 26.8 12/05/2019 2054   TCO2 28 12/05/2019 2054   O2SAT 84.0 12/05/2019 2054    HG/HCT   stable,       Component Value Date/Time    HGB 15.3 12/05/2019 2054   HGB 15.4 10/16/2019 0834   HCT 45.0 12/05/2019 2054   HCT 47.8 10/16/2019 0834   MCV 102.5 (H) 12/05/2019  1626   MCV 99 (H) 10/16/2019 0834     Troponin 12 -10 -12   ECG: Ordered Personally reviewed by me showing: HR : 62 Rhythm:  NSR,    no evidence of ischemic changes QTC 468   BNP (last 3 results) Recent Labs    09/13/19 1825 12/05/19 1626  BNP 235.6* 1,176.6*    DM  labs:  HbA1C: Recent Labs    09/23/19 0257  HGBA1C 5.6     UA   no evidence of UT  Small hg Urine analysis:    Component Value Date/Time   COLORURINE COLORLESS (A) 12/05/2019 2051   APPEARANCEUR CLEAR 12/05/2019 2051   LABSPEC 1.009 12/05/2019 2051   PHURINE 7.0 12/05/2019 2051   GLUCOSEU NEGATIVE 12/05/2019 2051   HGBUR SMALL (A) 12/05/2019 2051   [place] NEGATIVE 12/05/2019 2051   [place] NEGATIVE 12/05/2019 2051   PROTEINUR NEGATIVE 12/05/2019 2051   NITRITE NEGATIVE 12/05/2019 2051   LEUKOCYTESUR NEGATIVE 12/05/2019 2051     Ordered  CXR -Increased reticular densities are noted throughout both lungs concerning for worsening edema or atypical inflammation superimposed upon chronic interstitial lung disease.   CTA chest - no PE,  no evidence of infiltrate more consistent with CHF     ED Triage Vitals  Enc Vitals Group     BP 12/05/19 1622 (!) 191/65     Pulse Rate 12/05/19 1622 (!) 59     Resp 12/05/19 1622 13     Temp 12/05/19 1622 (!) 97.5 F (36.4 C)     Temp src --      SpO2 12/05/19 1621 (!) 87 %     Weight --      Height --      Head Circumference --      Peak Flow --      Pain Score --      Pain Loc --      Pain Edu? --      Excl. in [place]? --   TMAX(24)@       Latest  Blood pressure (!) 185/79, pulse (!) 57, temperature (!) 97.5 F (36.4 C), resp. rate 16, SpO2 96 %.      Review of Systems:    Pertinent positives include:   shortness of breath at rest  Constitutional:  No weight loss, night sweats, Fevers, chills, fatigue,  weight loss  HEENT:  No headaches, Difficulty swallowing,Tooth/dental problems,Sore throat,  No sneezing, itching, ear ache, nasal congestion, post nasal drip,  Cardio-vascular:  No chest pain, Orthopnea, PND, anasarca, dizziness, palpitations.no Bilateral lower extremity swelling  GI:  No heartburn, indigestion, abdominal pain, nausea, vomiting, diarrhea, change in bowel habits, loss of appetite, melena, blood in stool, hematemesis Resp:  no. No dyspnea on exertion, No excess mucus, no productive cough, No non-productive cough, No coughing up of blood.No change in color of mucus.No wheezing. Skin:  no rash or lesions. No jaundice GU:  no dysuria, change in color of urine, no urgency or frequency. No straining to urinate.  No flank pain.  Musculoskeletal:  No joint pain or no joint swelling. No decreased range of motion. No back pain.  Psych:  No change in mood or affect. No depression or anxiety. No memory loss.  Neuro: no localizing neurological complaints, no tingling, no weakness, no double vision, no gait abnormality, no slurred speech, no confusion  All systems reviewed and apart from Earl Mueller all are negative  Past Medical History:   Past Medical History:  Diagnosis Date  .  Anxiety   . Arthritis   . Cancer ([place])    skin  . Carotid artery stenosis 03/26/2017  . Coronary artery disease involving native coronary artery of native heart without angina pectoris 11/20/2014  . Dyslipidemia 11/20/2014  . Essential hypertension 11/20/2014   Dr Geraldo Pitter, [facility]  . Hypothyroidism   . Peripheral vascular disease ([place])   . Skin ulcer of scalp ([place]) 05/10/2013  . Symptomatic carotid artery stenosis 03/05/2017    Past Surgical History:  Procedure Laterality Date  . BACK SURGERY    . CARDIAC CATHETERIZATION  03/06/10   NL-LM, 40%pLAD, 30% mid/distal LAD, 98% ostial DIAG (small), 30%pCx, 50%mCx, 30%dCx, 30% OM2, 40%dRCA; Med RX rec ([facility])  . caritid endart    . CORONARY ATHERECTOMY N/A  09/14/2019   Procedure: CORONARY ATHERECTOMY;  Surgeon: Martinique, Peter M, MD;  Location: [place] [facility];  Service: Cardiovascular;  Laterality: N/A;  . CORONARY ATHERECTOMY N/A 09/15/2019   Procedure: CORONARY ATHERECTOMY;  Surgeon: Martinique, Peter M, MD;  Location: [place] [facility];  Service: Cardiovascular;  Laterality: N/A;  . CORONARY STENT INTERVENTION N/A 09/14/2019   Procedure: CORONARY STENT INTERVENTION;  Surgeon: Martinique, Peter M, MD;  Location: [place] [facility];  Service: Cardiovascular;  Laterality: N/A;  . CORONARY STENT INTERVENTION N/A 09/15/2019   Procedure: CORONARY STENT INTERVENTION;  Surgeon: Martinique, Peter M, MD;  Location: [place] [facility];  Service: Cardiovascular;  Laterality: N/A;  . ENDARTERECTOMY Left 03/26/2017   Procedure: ENDARTERECTOMY CAROTID LEFT;  Surgeon: Rosetta Posner, MD;  Location: [facility] OR;  Service: Vascular;  Laterality: Left;  . femerao bypass    . LEFT HEART CATH AND CORONARY ANGIOGRAPHY N/A 02/03/2017   Procedure: LEFT HEART CATH AND CORONARY ANGIOGRAPHY;  Surgeon: Martinique, Peter M, MD;  Location: [place] [facility];  Service: Cardiovascular;  Laterality: N/A;  . LEFT HEART CATH AND CORONARY ANGIOGRAPHY N/A 09/14/2019   Procedure: LEFT HEART CATH AND CORONARY ANGIOGRAPHY;  Surgeon: Martinique, Peter M, MD;  Location: [place] [facility];  Service: Cardiovascular;  Laterality: N/A;  . LOOP RECORDER INSERTION N/A 09/25/2019   Procedure: LOOP RECORDER INSERTION;  Surgeon: Deboraha Sprang, MD;  Location: [place] [facility];  Service: Cardiovascular;  Laterality: N/A;  . PATCH ANGIOPLASTY Left 03/26/2017   Procedure: PATCH ANGIOPLASTY LEFT CAROTID ARTERY;  Surgeon: Rosetta Posner, MD;  Location: [place];  Service: Vascular;  Laterality: Left;  . SCALP LACERATION REPAIR N/A 05/23/2013   Procedure: EXCISION SCALP ULCER DEBRIDEMENT OF SKIN AND BONE WITH PLACEMENT OF A-CELL;  Surgeon: Irene Limbo, MD;  Location: [place];  Service: Plastics;  Laterality: N/A;  . STOMACH SURGERY      x2  . VASCULAR SURGERY     AFBG 10/15/05    Social History:  Ambulatory     Independently     reports that he quit smoking about 11 years ago. He has never used smokeless tobacco. He reports current alcohol use. He reports that he does not use drugs.   Family History:   Family History  Problem Relation Age of Onset  . Heart failure Father     Allergies: Allergies  Allergen Reactions  . Other Other (See Comments)    Any narcotic makes him a "wild man"  Delusions (intolerance)     Prior to Admission medications   Medication Sig Start Date End Date Taking? Authorizing Provider  Acetaminophen 500 MG capsule Take 2 capsules (1,000 mg total) by mouth every 6 (six) hours as needed  for fever or pain. 09/25/19   Donzetta Starch, NP  aspirin EC 81 MG tablet Take 81 mg by mouth daily.    [provider]  atorvastatin (LIPITOR) 80 MG tablet TAKE 1 TABLET EVERY DAY 12/05/19   Marge Duncans, PA-C  Budeson-Glycopyrrol-Formoterol (BREZTRI AEROSPHERE) 160-9-4.8 MCG/ACT AERO Inhale 2 puffs into the lungs in the morning and at bedtime. 12/02/19   [place]um, Rex Kras, MD  cilostazol (PLETAL) 100 MG tablet Take 100 mg by mouth 2 (two) times daily.    [provider]  clopidogrel (PLAVIX) 75 MG tablet TAKE 1 TABLET EVERY DAY Patient taking differently: Take 75 mg by mouth daily.  05/02/19   CoxElnita Maxwell, MD  fenofibrate 160 MG tablet Take 160 mg by mouth daily.    [provider]  levothyroxine (SYNTHROID) 112 MCG tablet Take 1 tablet (112 mcg total) by mouth daily. 08/31/19   Cox, Elnita Maxwell, MD  losartan (COZAAR) 25 MG tablet Take 1 tablet (25 mg total) by mouth daily. Patient taking differently: Take 12.5 mg by mouth daily.  09/17/19   Georgette Shell, MD  metoprolol succinate (TOPROL-XL) 25 MG 24 hr tablet Take 1 tablet (25 mg total) by mouth daily. 09/17/19   Georgette Shell, MD  Multiple Vitamins-Minerals (CENTRUM SILVER PO) Take 1 tablet by mouth daily.    [provider]  nitroGLYCERIN (NITROSTAT) 0.4 MG SL tablet Place 0.4 mg under the tongue every 5 (five) minutes as needed for chest pain.    [provider]  PARoxetine (PAXIL) 20 MG tablet TAKE 1 TABLET EVERY DAY 10/09/19   Rochel Brome, MD   Physical Exam: Vitals with BMI 12/05/2019 12/05/2019 12/05/2019  Height - - -  Weight - - -  BMI - - -  Systolic 027 253 664  Diastolic 79 67 70  Pulse 57 58 59     1. General:  in No acute distress Chronically ill -appearing 2. Psychological: Alert and Oriented 3. Head/ENT:   Moist  Mucous Membranes                          Head Non traumatic, neck supple                        Poor Dentition 4. SKIN: normal  Skin turgor,  Skin clean Dry and intact no rash 5. Heart: Regular rate and rhythm no  Murmur, no Rub or gallop 6. Lungs: diminished at bases no wheezes or crackles   7. Abdomen: Soft on-tender, Non distended  bowel sounds present 8. Lower extremities: no clubbing, cyanosis, no  edema 9. Neurologically right hemiparesis 10. MSK: Normal range of motion   All other LABS:     Recent Labs  Lab 12/05/19 1626  WBC 12.1*  NEUTROABS 9.0*  HGB 15.6  HCT 48.5  MCV 102.5*  PLT 316     Recent Labs  Lab 12/05/19 1626  NA 141  K 4.0  CL 107  CO2 24  GLUCOSE 88  BUN 18  CREATININE 1.27*  CALCIUM 9.3     No results for input(s): AST, ALT, ALKPHOS, BILITOT, PROT, ALBUMIN in the last 168 hours.     Cultures: No results found for: SDES, SPECREQUEST, CULT, REPTSTATUS   Radiological Exams on Admission: CT Angio Chest PE W and/or Wo Contrast  Result Date: 12/05/2019 CLINICAL DATA:  Hypoxia. EXAM: CT ANGIOGRAPHY CHEST WITH CONTRAST TECHNIQUE: Multidetector CT imaging of  the chest was performed using the standard protocol during bolus administration of intravenous contrast. Multiplanar CT image reconstructions and MIPs were obtained to evaluate the vascular anatomy. CONTRAST:  6mL OMNIPAQUE IOHEXOL 350 MG/ML SOLN COMPARISON:   September 13, 2019. FINDINGS: Cardiovascular: Satisfactory opacification of the pulmonary arteries to the segmental level. No evidence of pulmonary embolism. Normal heart size. No pericardial effusion. Coronary artery calcifications are noted. Atherosclerosis of thoracic aorta is noted. Mediastinum/Nodes: Thyroid gland is unremarkable. Esophagus is unremarkable. Stable enlarged mediastinal adenopathy is noted most likely is reactive in etiology. Lungs/Pleura: Moderate bilateral pleural effusions are noted with adjacent subsegmental atelectasis. No pneumothorax is noted. Emphysematous disease is noted bilaterally. Increased interstitial densities are noted throughout both lungs concerning for possible pulmonary edema. Upper Abdomen: No acute abnormality. Musculoskeletal: No chest wall abnormality. No acute or significant osseous findings. Review of the MIP images confirms the above findings. IMPRESSION: 1. No definite evidence of pulmonary embolus. 2. Coronary artery calcifications are noted suggesting coronary artery disease. 3. Moderate bilateral pleural effusions are noted with adjacent subsegmental atelectasis. 4. Increased interstitial densities are noted throughout both lungs concerning for possible pulmonary edema. 5. Stable enlarged mediastinal adenopathy is noted most likely reactive in etiology. 6. Emphysema and aortic atherosclerosis. Aortic Atherosclerosis (ICD10-I70.0) and Emphysema (ICD10-J43.9). Electronically Signed   By: Marijo Conception M.D.   On: 12/05/2019 19:16   DG Chest Portable 1 View  Result Date: 12/05/2019 CLINICAL DATA:  Dyspnea on exertion. EXAM: PORTABLE CHEST 1 VIEW COMPARISON:  September 16, 2019. FINDINGS: The heart size and mediastinal contours are within normal limits. No pneumothorax or pleural effusion is noted. Increased reticular densities are noted throughout both lungs concerning for worsening edema or atypical inflammation superimposed upon chronic interstitial lung disease. The  visualized skeletal structures are unremarkable. IMPRESSION: Increased reticular densities are noted throughout both lungs concerning for worsening edema or atypical inflammation superimposed upon chronic interstitial lung disease. Aortic Atherosclerosis (ICD10-I70.0). Electronically Signed   By: Marijo Conception M.D.   On: 12/05/2019 16:48    Chart has been reviewed   Assessment/Plan   84 y.o. 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 Acute on chronic combined systolic and diastolic CHF (congestive heart failure) ([place]) -  - admit on telemetry,  cycle cardiac enzymes, 02-23-11 obtain serial ECG  to evaluate for ischemia as a cause of heart failure  Last BNP BNP (last 3 results) Recent Labs    09/13/19 1825 12/05/19 1626  BNP 235.6* 1,176.6*      diurese with IV lasix and monitor orthostatics and creatinine to avoid over diuresis.  Order echogram to evaluate EF and valves  ACE/ARBi ordered    cardiology consulted   . Acute on chronic respiratory failure with hypoxia (HCC) -provide oxygen as needed order continuous pulse ox  . Coronary artery disease involving native coronary artery of native heart without angina pectoris -chronic stable continue Plavix statin and baby aspirin  . Dyslipidemia -continue statin  . Essential hypertension -continue home medications of blood pressure well   . Hypothyroidism - - Check TSH continue home medications at current dose  . Peripheral vascular disease ([place]) -continue to legs unsure if still on Pletal as well Will need to clarify with family  . Pulmonary emphysema (HCC) -stable quit smoking 12 years ago denies  any respiratory symptoms such as cough or wheezing.  Treat as needed  . Leukocytosis -unclear etiology no evidence of  febrile illness or infectious process continue to monitor   CKD - chronic stable , avoid nephrotoxic meds   Other plan as per orders.  DVT prophylaxis:   Lovenox       Code Status:    Code Status: Prior FULL CODEas per patient  I had personally discussed CODE STATUS with patient   Family Communication:   Family not at  Bedside    Disposition Plan:       To home once workup is complete and patient is stable   Following barriers for discharge:                          Hypoxia  Improved                                                        Will likely need home health,                             Will need consultants to evaluate patient prior to discharge                   Would benefit from PT/OT eval prior to DC  Ordered                                      Consults called:   Emailed cardiology   Admission status:  ED Disposition    ED Disposition Condition [place]: [place] [[id number]]  Level of Care: Progressive [102]  Admit to Progressive based on following criteria: CARDIOVASCULAR & THORACIC of moderate stability with acute coronary syndrome symptoms/low risk myocardial infarction/hypertensive urgency/arrhythmias/heart failure potentially compromising stability and stable post cardiovascular intervention patients.  Admit to Progressive based on following criteria: RESPIRATORY PROBLEMS hypoxemic/hypercapnic respiratory failure that is responsive to NIPPV (BiPAP) or High Flow Nasal Cannula (6-80 lpm). Frequent assessment/intervention, no > Q2 hrs < Q4 hrs, to maintain oxygenation and pulmonary hygiene.  May admit patient to [facility] or [facility] if equivalent level of care is available:: No  Covid Evaluation: Confirmed COVID Negative  Diagnosis: CHF exacerbation [facility]) [[id number]]  Admitting Physician: Toy Baker [[id number]]  Attending Physician: Toy Baker [[id number]]  Estimated length of stay: past midnight tomorrow   Certification:: I certify this patient will need inpatient services for at least 2 midnights          inpatient     I Expect 2 midnight stay secondary to severity of patient's current illness need for inpatient interventions justified by the following:  hemodynamic instability despite optimal treatment   Hypoxia, )   Severe lab/radiological/exam abnormalities including:    PUlmonary effusions and Edema and extensive comorbidities including:    CHF C AD  COPD/asthma      CKD      history of stroke with residual deficits   That are currently affecting medical management.   I expect  patient to be hospitalized for 2 midnights requiring inpatient medical care.  Patient is at high risk  for adverse outcome (such as loss of life or disability) if not treated.  Indication for inpatient stay as follows:    New or worsening hypoxia  Need for IV diuretics    Level of care  Progressive  tele indefinitely please discontinue once patient no longer qualifies COVID-19 Labs    Lab Results  Component Value Date   [place] 12/05/2019     Precautions: admitted as  Covid Negative     PPE: Used by the provider:  P100  eye Goggles,  Gloves  Majesty Stehlin 12/05/2019, 9:00 PM    [facility]     after 2 AM please page floor coverage PA If 7AM-7PM, please contact the day team taking care of the patient using [email or web address]   Patient was evaluated in the context of the global COVID-19 pandemic, which necessitated consideration that the patient might be at risk for infection with the SARS-CoV-2 virus that causes COVID-19. Institutional protocols and algorithms that pertain to the evaluation of patients at risk for COVID-19 are in a state of rapid change based on information released by regulatory bodies including the CDC and federal and state organizations. These policies and algorithms were followed during the patient's care.

## 2019-12-05 NOTE — ED Notes (Signed)
Patient transported to CT 

## 2019-12-05 NOTE — ED Triage Notes (Signed)
BIB [facility] w/ complaints of SoB. Pt reports feeling short of breath for about 2 months. When at the dr's office he arrived and was 53 % RA and then 65 % on 3 L of O2 so then the dr.s office called EMS. EMS placed pt on 6 L and improved to 88-92 %. Denies chest pain, cough,etc VSS WNL. NAD at this time.

## 2019-12-05 NOTE — ED Notes (Signed)
Nancy [phone]   ( cell phone  [phone]) call  when you have an update or if pt is admitted with room number or discharged

## 2019-12-05 NOTE — Progress Notes (Signed)
Acute Office Visit  Subjective:    Patient ID: Earl Mueller, male    DOB: 08-06-1935, 84 y.o.   MRN: [id number]  Chief Complaint  Patient presents with  . Shortness of Breath    HPI Patient is in today for SOB.  Patient presents with worsening shortness of breath.  Denies coughing.  Patient was seen in July 2021 and diagnosed with an acute MI.  He was transferred to [facility] where he underwent a stent.  This was complicated postoperatively after he came home with a stroke.  He was hospitalized for period of time in July and when he was discharged she was discharged home on 3 L of oxygen.  On the patient was seen last week by Dr. Sinda Du for a annual wellness visit.  He was on no inhalers and so we started him on breast Turi 2 puffs twice daily.  Despite this he has continued to have worsening oxygen.  Upon presenting to the office his pulse ox was 53%.  His oxygen tank had run out of oxygen.  We added 3 L of oxygen per nasal cannula which improved from 53 to 65%.  He has now up to 6 L of oxygen and is at 89%. Denies chest pain.  Denies palpitations.  Denies coughing.  Denies fever.  Past Medical History:  Diagnosis Date  . Anxiety   . Arthritis   . Cancer ([place])    skin  . Carotid artery stenosis 03/26/2017  . Coronary artery disease involving native coronary artery of native heart without angina pectoris 11/20/2014  . Dyslipidemia 11/20/2014  . Essential hypertension 11/20/2014   Dr Geraldo Pitter, [facility]  . Hypothyroidism   . Peripheral vascular disease ([place])   . Skin ulcer of scalp ([place]) 05/10/2013  . Symptomatic carotid artery stenosis 03/05/2017    Past Surgical History:  Procedure Laterality Date  . BACK SURGERY    . CARDIAC CATHETERIZATION  03/06/10   NL-LM, 40%pLAD, 30% mid/distal LAD, 98% ostial DIAG (small), 30%pCx, 50%mCx, 30%dCx, 30% OM2, 40%dRCA; Med RX rec ([facility])  . caritid endart    . CORONARY ATHERECTOMY N/A 09/14/2019   Procedure: CORONARY ATHERECTOMY;  Surgeon:  Martinique, Peter M, MD;  Location: [place] [facility];  Service: Cardiovascular;  Laterality: N/A;  . CORONARY ATHERECTOMY N/A 09/15/2019   Procedure: CORONARY ATHERECTOMY;  Surgeon: Martinique, Peter M, MD;  Location: [place] [facility];  Service: Cardiovascular;  Laterality: N/A;  . CORONARY STENT INTERVENTION N/A 09/14/2019   Procedure: CORONARY STENT INTERVENTION;  Surgeon: Martinique, Peter M, MD;  Location: [place] [facility];  Service: Cardiovascular;  Laterality: N/A;  . CORONARY STENT INTERVENTION N/A 09/15/2019   Procedure: CORONARY STENT INTERVENTION;  Surgeon: Martinique, Peter M, MD;  Location: [place] [facility];  Service: Cardiovascular;  Laterality: N/A;  . ENDARTERECTOMY Left 03/26/2017   Procedure: ENDARTERECTOMY CAROTID LEFT;  Surgeon: Rosetta Posner, MD;  Location: [facility] OR;  Service: Vascular;  Laterality: Left;  . femerao bypass    . LEFT HEART CATH AND CORONARY ANGIOGRAPHY N/A 02/03/2017   Procedure: LEFT HEART CATH AND CORONARY ANGIOGRAPHY;  Surgeon: Martinique, Peter M, MD;  Location: [place] [facility];  Service: Cardiovascular;  Laterality: N/A;  . LEFT HEART CATH AND CORONARY ANGIOGRAPHY N/A 09/14/2019   Procedure: LEFT HEART CATH AND CORONARY ANGIOGRAPHY;  Surgeon: Martinique, Peter M, MD;  Location: [place] [facility];  Service: Cardiovascular;  Laterality: N/A;  . LOOP RECORDER INSERTION N/A 09/25/2019   Procedure: LOOP  RECORDER INSERTION;  Surgeon: Deboraha Sprang, MD;  Location: [place] [facility];  Service: Cardiovascular;  Laterality: N/A;  . PATCH ANGIOPLASTY Left 03/26/2017   Procedure: PATCH ANGIOPLASTY LEFT CAROTID ARTERY;  Surgeon: Rosetta Posner, MD;  Location: [place];  Service: Vascular;  Laterality: Left;  . SCALP LACERATION REPAIR N/A 05/23/2013   Procedure: EXCISION SCALP ULCER DEBRIDEMENT OF SKIN AND BONE WITH PLACEMENT OF A-CELL;  Surgeon: Irene Limbo, MD;  Location: [place];  Service: Plastics;  Laterality: N/A;  . STOMACH SURGERY     x2  . VASCULAR SURGERY     AF[facility] 10/15/05     Family History  Problem Relation Age of Onset  . Heart failure Father     Social History   Socioeconomic History  . Marital status: Married    Spouse name: Not on file  . Number of children: Not on file  . Years of education: Not on file  . Highest education level: Not on file  Occupational History  . Not on file  Tobacco Use  . Smoking status: Former Smoker    Quit date: 12/18/2007    Years since quitting: 11.9  . Smokeless tobacco: Never Used  Vaping Use  . Vaping Use: Never used  Substance and Sexual Activity  . Alcohol use: Yes    Comment: occ  . Drug use: No  . Sexual activity: Not on file  Other Topics Concern  . Not on file  Social History Narrative  . Not on file   Social Determinants of Health   Financial Resource Strain:   . Difficulty of Paying Living Expenses: Not on file  Food Insecurity: No Food Insecurity  . Worried About Charity fundraiser in the Last Year: Never true  . Ran Out of Food in the Last Year: Never true  Transportation Needs: No Transportation Needs  . Lack of Transportation (Medical): No  . Lack of Transportation (Non-Medical): No  Physical Activity: Inactive  . Days of Exercise per Week: 0 days  . Minutes of Exercise per Session: 0 min  Stress:   . Feeling of Stress : Not on file  Social Connections:   . Frequency of Communication with Friends and Family: Not on file  . Frequency of Social Gatherings with Friends and Family: Not on file  . Attends Religious Services: Not on file  . Active Member of Clubs or Organizations: Not on file  . Attends Archivist Meetings: Not on file  . Marital Status: Not on file  Intimate Partner Violence:   . Fear of Current or Ex-Partner: Not on file  . Emotionally Abused: Not on file  . Physically Abused: Not on file  . Sexually Abused: Not on file    Outpatient Medications Prior to Visit  Medication Sig Dispense Refill  . Acetaminophen 500 MG capsule Take 2 capsules (1,000 mg  total) by mouth every 6 (six) hours as needed for fever or pain. 30 capsule 0  . aspirin EC 81 MG tablet Take 81 mg by mouth daily.    Marland Kitchen atorvastatin (LIPITOR) 80 MG tablet TAKE 1 TABLET EVERY DAY 90 tablet 0  . Budeson-Glycopyrrol-Formoterol (BREZTRI AEROSPHERE) 160-9-4.8 MCG/ACT AERO Inhale 2 puffs into the lungs in the morning and at bedtime. 10.7 g 0  . cilostazol (PLETAL) 100 MG tablet Take 100 mg by mouth 2 (two) times daily.    . clopidogrel (PLAVIX) 75 MG tablet TAKE 1 TABLET EVERY DAY (Patient taking differently: Take 75 mg by  mouth daily. ) 90 tablet 3  . fenofibrate 160 MG tablet Take 160 mg by mouth daily.    Marland Kitchen levothyroxine (SYNTHROID) 112 MCG tablet Take 1 tablet (112 mcg total) by mouth daily. 90 tablet 0  . losartan (COZAAR) 25 MG tablet Take 1 tablet (25 mg total) by mouth daily. (Patient taking differently: Take 12.5 mg by mouth daily. ) 30 tablet 4  . metoprolol succinate (TOPROL-XL) 25 MG 24 hr tablet Take 1 tablet (25 mg total) by mouth daily. 30 tablet 4  . Multiple Vitamins-Minerals (CENTRUM SILVER PO) Take 1 tablet by mouth daily.    . nitroGLYCERIN (NITROSTAT) 0.4 MG SL tablet Place 0.4 mg under the tongue every 5 (five) minutes as needed for chest pain.    Marland Kitchen PARoxetine (PAXIL) 20 MG tablet TAKE 1 TABLET EVERY DAY 90 tablet 3   No facility-administered medications prior to visit.    Allergies  Allergen Reactions  . Other Other (See Comments)    Any narcotic makes him a "wild man"  Delusions (intolerance)    Review of Systems  Constitutional: Positive for fatigue. Negative for chills and fever.  Respiratory: Positive for shortness of breath. Negative for cough.   Cardiovascular: Negative for chest pain and leg swelling.       Objective:    Physical Exam Vitals reviewed.  Constitutional:      General: He is in acute distress.     Appearance: He is ill-appearing.     Comments: thin  Cardiovascular:     Rate and Rhythm: Normal rate and regular rhythm.   Pulmonary:     Effort: Tachypnea and respiratory distress present.     Breath sounds: Examination of the right-upper field reveals decreased breath sounds. Examination of the left-upper field reveals decreased breath sounds. Examination of the right-middle field reveals decreased breath sounds. Examination of the left-middle field reveals decreased breath sounds. Examination of the right-lower field reveals decreased breath sounds. Examination of the left-lower field reveals decreased breath sounds. Decreased breath sounds present.  Chest:     Chest wall: No edema.     There were no vitals taken for this visit. Wt Readings from Last 3 Encounters:  11/30/19 142 lb (64.4 kg)  10/31/19 143 lb (64.9 kg)  10/19/19 143 lb (64.9 kg)    Lab Results  Component Value Date   TSH 1.870 10/16/2019   Lab Results  Component Value Date   WBC 7.2 10/16/2019   HGB 15.4 10/16/2019   HCT 47.8 10/16/2019   MCV 99 (H) 10/16/2019   PLT 280 10/16/2019   Lab Results  Component Value Date   NA 141 10/16/2019   K 4.5 10/16/2019   CO2 22 10/16/2019   GLUCOSE 100 (H) 10/16/2019   BUN 18 10/16/2019   CREATININE 1.27 10/16/2019   BILITOT 0.7 10/16/2019   ALKPHOS 75 10/16/2019   AST 28 10/16/2019   ALT 13 10/16/2019   PROT 6.9 10/16/2019   ALBUMIN 4.0 10/16/2019   CALCIUM 9.6 10/16/2019   ANIONGAP 8 09/25/2019   Lab Results  Component Value Date   CHOL 124 09/23/2019   Lab Results  Component Value Date   HDL 51 09/23/2019   Lab Results  Component Value Date   LDLCALC 63 09/23/2019   Lab Results  Component Value Date   TRIG 52 09/23/2019   Lab Results  Component Value Date   CHOLHDL 2.4 09/23/2019   Lab Results  Component Value Date   HGBA1C 5.6 09/23/2019  Assessment & Plan:  1. Acute respiratory failure with hypoxia (HCC) On chronic respiratory failure. Concerned about CAP vs copd exacerbation. covid 19 poct negative.  2. Chronic respiratory failure with hypoxia  (HCC) 3. Hypertensive heart disease without heart failure  Send by EMS to ED.   Follow-up: No follow-ups on file.  An After Visit Summary was printed and given to the patient.  [place] [phone]

## 2019-12-05 NOTE — ED Provider Notes (Signed)
[facility] EMERGENCY DEPARTMENT Provider Note   CSN: [id number] Arrival date & time: 12/05/19  1611     History Chief Complaint  Patient presents with   Shortness of Breath    Earl Mueller is a 84 y.o. male.  Presents ER with concern for shortness of breath.  Patient reports that over the past couple months he has had worsening shortness of breath.  Notices his breathing gets particularly bad on any sort of exertion.  Over the last week or so the breathing has gotten worse.  He reports he normally wears around 3 L of oxygen but has had to go up on it recently.  No chest pain, no new cough, no fevers.  Seen at PCP, sent in for hypoxia.  HPI     Past Medical History:  Diagnosis Date   Anxiety    Arthritis    Cancer ([place])    skin   Carotid artery stenosis 03/26/2017   Coronary artery disease involving native coronary artery of native heart without angina pectoris 11/20/2014   Dyslipidemia 11/20/2014   Essential hypertension 11/20/2014   Dr Geraldo Pitter, [place]   Hypothyroidism    Peripheral vascular disease ([place])    Skin ulcer of scalp ([place]) 05/10/2013   Symptomatic carotid artery stenosis 03/05/2017    Patient Active Problem List   Diagnosis Date Noted   CHF exacerbation ([place]) 12/05/2019   Acute on chronic combined systolic and diastolic CHF (congestive heart failure) ([place]) 12/05/2019   Acute on chronic respiratory failure with hypoxia ([place]) 12/05/2019   Leukocytosis 12/05/2019   Need for immunization against influenza 12/02/2019   Medicare annual wellness visit, subsequent 12/02/2019   Chronic respiratory failure with hypoxia ([place]) 10/29/2019   Hypertensive heart disease without heart failure 10/29/2019   Hemiparesis, speech and language deficits, and cognitive deficits due to recent cerebral infarction ([place]) 09/25/2019   Cerebral embolism with cerebral infarction - multifocal, s/p tPA, source unknown  09/25/2019   Cardiomyopathy,  ischemic 09/25/2019   Hypothyroidism 09/25/2019   Depression 09/25/2019   Hypokalemia 09/25/2019   Pulmonary emphysema ([place]) 09/25/2019   Aortic atherosclerosis ([place]) 09/25/2019   Unstable angina (HCC)    Ischemic heart disease 09/13/2019   Exertional dyspnea 09/13/2019   Carotid artery stenosis 03/26/2017   Symptomatic carotid artery stenosis 03/05/2017   Carotid bruit 11/25/2016   Coronary artery disease involving native coronary artery of native heart without angina pectoris 11/20/2014   Dyslipidemia 11/20/2014   Essential hypertension 11/20/2014   Peripheral vascular disease ([place]) 11/20/2014    Past Surgical History:  Procedure Laterality Date   BACK SURGERY     CARDIAC CATHETERIZATION  03/06/10   NL-LM, 40%pLAD, 30% mid/distal LAD, 98% ostial DIAG (small), 30%pCx, 50%mCx, 30%dCx, 30% OM2, 40%dRCA; Med RX rec (H[facility])   caritid endart     CORONARY ATHERECTOMY N/A 09/14/2019   Procedure: CORONARY ATHERECTOMY;  Surgeon: Martinique, Peter M, MD;  Location: [place] [facility];  Service: Cardiovascular;  Laterality: N/A;   CORONARY ATHERECTOMY N/A 09/15/2019   Procedure: CORONARY ATHERECTOMY;  Surgeon: Martinique, Peter M, MD;  Location: [place] [facility];  Service: Cardiovascular;  Laterality: N/A;   CORONARY STENT INTERVENTION N/A 09/14/2019   Procedure: CORONARY STENT INTERVENTION;  Surgeon: Martinique, Peter M, MD;  Location: [place] [facility];  Service: Cardiovascular;  Laterality: N/A;   CORONARY STENT INTERVENTION N/A 09/15/2019   Procedure: CORONARY STENT INTERVENTION;  Surgeon: Martinique, Peter M, MD;  Location: [place] [facility];  Service: Cardiovascular;  Laterality: N/A;   ENDARTERECTOMY Left 03/26/2017   Procedure: ENDARTERECTOMY CAROTID LEFT;  Surgeon: Rosetta Posner, MD;  Location: [facility] OR;  Service: Vascular;  Laterality: Left;   femerao bypass     LEFT HEART CATH AND CORONARY ANGIOGRAPHY N/A 02/03/2017   Procedure: LEFT HEART CATH AND CORONARY ANGIOGRAPHY;  Surgeon:  Martinique, Peter M, MD;  Location: [place] [facility];  Service: Cardiovascular;  Laterality: N/A;   LEFT HEART CATH AND CORONARY ANGIOGRAPHY N/A 09/14/2019   Procedure: LEFT HEART CATH AND CORONARY ANGIOGRAPHY;  Surgeon: Martinique, Peter M, MD;  Location: [place] [facility];  Service: Cardiovascular;  Laterality: N/A;   LOOP RECORDER INSERTION N/A 09/25/2019   Procedure: LOOP RECORDER INSERTION;  Surgeon: Deboraha Sprang, MD;  Location: [place] [facility];  Service: Cardiovascular;  Laterality: N/A;   PATCH ANGIOPLASTY Left 03/26/2017   Procedure: PATCH ANGIOPLASTY LEFT CAROTID ARTERY;  Surgeon: Rosetta Posner, MD;  Location: [facility] OR;  Service: Vascular;  Laterality: Left;   SCALP LACERATION REPAIR N/A 05/23/2013   Procedure: EXCISION SCALP ULCER DEBRIDEMENT OF SKIN AND BONE WITH PLACEMENT OF A-CELL;  Surgeon: Irene Limbo, MD;  Location: [place];  Service: Plastics;  Laterality: N/A;   STOMACH SURGERY     x2   VASCULAR SURGERY     AFBG 10/15/05       Family History  Problem Relation Age of Onset   Heart failure Father     Social History   Tobacco Use   Smoking status: Former Smoker    Quit date: 12/18/2007    Years since quitting: 11.9   Smokeless tobacco: Never Used  Vaping Use   Vaping Use: Never used  Substance Use Topics   Alcohol use: Yes    Comment: occ   Drug use: No    Home Medications Prior to Admission medications   Medication Sig Start Date End Date Taking? Authorizing Provider  Acetaminophen 500 MG capsule Take 2 capsules (1,000 mg total) by mouth every 6 (six) hours as needed for fever or pain. 09/25/19   Donzetta Starch, NP  aspirin EC 81 MG tablet Take 81 mg by mouth daily.    [provider]  atorvastatin (LIPITOR) 80 MG tablet TAKE 1 TABLET EVERY DAY 12/05/19   Marge Duncans, PA-C  Budeson-Glycopyrrol-Formoterol (BREZTRI AEROSPHERE) 160-9-4.8 MCG/ACT AERO Inhale 2 puffs into the lungs in the morning and at bedtime. 12/02/19   Corum, Rex Kras, MD    cilostazol (PLETAL) 100 MG tablet Take 100 mg by mouth 2 (two) times daily.    [provider]  clopidogrel (PLAVIX) 75 MG tablet TAKE 1 TABLET EVERY DAY Patient taking differently: Take 75 mg by mouth daily.  05/02/19   CoxElnita Maxwell, MD  fenofibrate 160 MG tablet Take 160 mg by mouth daily.    [provider]  levothyroxine (SYNTHROID) 112 MCG tablet Take 1 tablet (112 mcg total) by mouth daily. 08/31/19   Cox, Elnita Maxwell, MD  losartan (COZAAR) 25 MG tablet Take 1 tablet (25 mg total) by mouth daily. Patient taking differently: Take 12.5 mg by mouth daily.  09/17/19   Georgette Shell, MD  metoprolol succinate (TOPROL-XL) 25 MG 24 hr tablet Take 1 tablet (25 mg total) by mouth daily. 09/17/19   Georgette Shell, MD  Multiple Vitamins-Minerals (CENTRUM SILVER PO) Take 1 tablet by mouth daily.    [provider]  nitroGLYCERIN (NITROSTAT) 0.4 MG SL tablet Place 0.4 mg under the tongue every 5 (five) minutes  as needed for chest pain.    [provider]  PARoxetine (PAXIL) 20 MG tablet TAKE 1 TABLET EVERY DAY 10/09/19   CoxElnita Maxwell, MD    Allergies    Other  Review of Systems   Review of Systems  Constitutional: Positive for fatigue. Negative for chills and fever.  HENT: Negative for ear pain and sore throat.   Eyes: Negative for pain and visual disturbance.  Respiratory: Positive for shortness of breath. Negative for cough.   Cardiovascular: Negative for chest pain and palpitations.  Gastrointestinal: Negative for abdominal pain and vomiting.  Genitourinary: Negative for dysuria and hematuria.  Musculoskeletal: Negative for arthralgias and back pain.  Skin: Negative for color change and rash.  Neurological: Negative for seizures and syncope.  All other systems reviewed and are negative.   Physical Exam Updated Vital Signs BP (!) 159/99    Pulse (!) 55    Temp (!) 97.5 F (36.4 C)    Resp 16    SpO2 91%   Physical Exam Vitals and nursing note  reviewed.  Constitutional:      Appearance: He is well-developed.  HENT:     Head: Normocephalic and atraumatic.  Eyes:     Conjunctiva/sclera: Conjunctivae normal.  Cardiovascular:     Rate and Rhythm: Normal rate and regular rhythm.     Heart sounds: No murmur heard.   Pulmonary:     Comments: Bilateral crackles, 6 L nasal cannula, no distress Abdominal:     Palpations: Abdomen is soft.     Tenderness: There is no abdominal tenderness.  Musculoskeletal:     Cervical back: Neck supple.  Skin:    General: Skin is warm and dry.  Neurological:     General: No focal deficit present.     Mental Status: He is alert.  Psychiatric:        Mood and Affect: Mood normal.        Behavior: Behavior normal.     ED Results / Procedures / Treatments   Labs (all labs ordered are listed, but only abnormal results are displayed) Labs Reviewed  CBC WITH DIFFERENTIAL/PLATELET - Abnormal; Notable for the following components:      Result Value   WBC 12.1 (*)    MCV 102.5 (*)    RDW 16.6 (*)    Neutro Abs 9.0 (*)    Monocytes Absolute 1.1 (*)    Abs Immature Granulocytes 0.10 (*)    All other components within normal limits  BASIC METABOLIC PANEL - Abnormal; Notable for the following components:   Creatinine, Ser 1.27 (*)    GFR calc non Af Amer 52 (*)    All other components within normal limits  BRAIN NATRIURETIC PEPTIDE - Abnormal; Notable for the following components:   B Natriuretic Peptide 1,176.6 (*)    All other components within normal limits  URINALYSIS, ROUTINE W REFLEX MICROSCOPIC - Abnormal; Notable for the following components:   Color, Urine COLORLESS (*)    Hgb urine dipstick SMALL (*)    Bacteria, UA RARE (*)    All other components within normal limits  I-STAT VENOUS BLOOD GAS, ED - Abnormal; Notable for the following components:   pH, Ven 7.447 (*)    pCO2, Ven 38.8 (*)    pO2, Ven 47.0 (*)    Acid-Base Excess 3.0 (*)    Calcium, Ion 0.98 (*)    All other  components within normal limits  SARS CORONAVIRUS 2 BY RT PCR [facility] ORDER,  [place] LAB)  LACTIC ACID, PLASMA  PROCALCITONIN  TSH  BLOOD GAS, VENOUS  MAGNESIUM  PHOSPHORUS  CBC WITH DIFFERENTIAL/PLATELET  COMPREHENSIVE METABOLIC PANEL  TROPONIN I (HIGH SENSITIVITY)  TROPONIN I (HIGH SENSITIVITY)  TROPONIN I (HIGH SENSITIVITY)    EKG EKG Interpretation  Date/Time:  Tuesday December 05 2019 16:22:06 EDT Ventricular Rate:  62 PR Interval:    QRS Duration: 88 QT Interval:  460 QTC Calculation: 468 R Axis:   4 Text Interpretation: Sinus rhythm Anteroseptal infarct, age indeterminate Confirmed by Madalyn Rob [phone]) on 12/05/2019 4:57:31 PM   Radiology CT Angio Chest PE W and/or Wo Contrast  Result Date: 12/05/2019 CLINICAL DATA:  Hypoxia. EXAM: CT ANGIOGRAPHY CHEST WITH CONTRAST TECHNIQUE: Multidetector CT imaging of the chest was performed using the standard protocol during bolus administration of intravenous contrast. Multiplanar CT image reconstructions and MIPs were obtained to evaluate the vascular anatomy. CONTRAST:  32mL OMNIPAQUE IOHEXOL 350 MG/ML SOLN COMPARISON:  September 13, 2019. FINDINGS: Cardiovascular: Satisfactory opacification of the pulmonary arteries to the segmental level. No evidence of pulmonary embolism. Normal heart size. No pericardial effusion. Coronary artery calcifications are noted. Atherosclerosis of thoracic aorta is noted. Mediastinum/Nodes: Thyroid gland is unremarkable. Esophagus is unremarkable. Stable enlarged mediastinal adenopathy is noted most likely is reactive in etiology. Lungs/Pleura: Moderate bilateral pleural effusions are noted with adjacent subsegmental atelectasis. No pneumothorax is noted. Emphysematous disease is noted bilaterally. Increased interstitial densities are noted throughout both lungs concerning for possible pulmonary edema. Upper Abdomen: No acute abnormality. Musculoskeletal: No chest wall  abnormality. No acute or significant osseous findings. Review of the MIP images confirms the above findings. IMPRESSION: 1. No definite evidence of pulmonary embolus. 2. Coronary artery calcifications are noted suggesting coronary artery disease. 3. Moderate bilateral pleural effusions are noted with adjacent subsegmental atelectasis. 4. Increased interstitial densities are noted throughout both lungs concerning for possible pulmonary edema. 5. Stable enlarged mediastinal adenopathy is noted most likely reactive in etiology. 6. Emphysema and aortic atherosclerosis. Aortic Atherosclerosis (ICD10-I70.0) and Emphysema (ICD10-J43.9). Electronically Signed   By: Marijo Conception M.D.   On: 12/05/2019 19:16   DG Chest Portable 1 View  Result Date: 12/05/2019 CLINICAL DATA:  Dyspnea on exertion. EXAM: PORTABLE CHEST 1 VIEW COMPARISON:  September 16, 2019. FINDINGS: The heart size and mediastinal contours are within normal limits. No pneumothorax or pleural effusion is noted. Increased reticular densities are noted throughout both lungs concerning for worsening edema or atypical inflammation superimposed upon chronic interstitial lung disease. The visualized skeletal structures are unremarkable. IMPRESSION: Increased reticular densities are noted throughout both lungs concerning for worsening edema or atypical inflammation superimposed upon chronic interstitial lung disease. Aortic Atherosclerosis (ICD10-I70.0). Electronically Signed   By: Marijo Conception M.D.   On: 12/05/2019 16:48    Procedures .Critical Care Performed by: Lucrezia Starch, MD Authorized by: Lucrezia Starch, MD   Critical care provider statement:    Critical care time (minutes):  41   Critical care was necessary to treat or prevent imminent or life-threatening deterioration of the following conditions:  Respiratory failure   Critical care was time spent personally by me on the following activities:  Discussions with consultants, evaluation of  patient's response to treatment, examination of patient, ordering and performing treatments and interventions, ordering and review of laboratory studies, ordering and review of radiographic studies, pulse oximetry, re-evaluation of patient's condition, obtaining history from patient or surrogate and review of old charts   (including critical care time)  Medications Ordered in ED Medications  furosemide (LASIX) injection 40 mg (has no administration in time range)  aspirin EC tablet 81 mg (has no administration in time range)  atorvastatin (LIPITOR) tablet 80 mg (has no administration in time range)  clopidogrel (PLAVIX) tablet 75 mg (has no administration in time range)  fenofibrate tablet 160 mg (has no administration in time range)  levothyroxine (SYNTHROID) tablet 112 mcg (has no administration in time range)  losartan (COZAAR) tablet 12.5 mg (has no administration in time range)  PARoxetine (PAXIL) tablet 20 mg (has no administration in time range)  metoprolol succinate (TOPROL-XL) 24 hr tablet 25 mg (has no administration in time range)  sodium chloride flush (NS) 0.9 % injection 3 mL (has no administration in time range)  acetaminophen (TYLENOL) tablet 650 mg (has no administration in time range)    Or  acetaminophen (TYLENOL) suppository 650 mg (has no administration in time range)  HYDROcodone-acetaminophen (NORCO/VICODIN) 5-325 MG per tablet 1-2 tablet (has no administration in time range)  ondansetron (ZOFRAN) tablet 4 mg (has no administration in time range)    Or  ondansetron (ZOFRAN) injection 4 mg (has no administration in time range)  enoxaparin (LOVENOX) injection 40 mg (has no administration in time range)  sodium chloride flush (NS) 0.9 % injection 3 mL (has no administration in time range)  sodium chloride flush (NS) 0.9 % injection 3 mL (has no administration in time range)  0.9 %  sodium chloride infusion (has no administration in time range)  albuterol (PROVENTIL)  (2.5 MG/3ML) 0.083% nebulizer solution 2.5 mg (has no administration in time range)  iohexol (OMNIPAQUE) 350 MG/ML injection 75 mL (75 mLs Intravenous Contrast Given 12/05/19 1903)  furosemide (LASIX) injection 40 mg (40 mg Intravenous Given 12/05/19 1941)    ED Course  I have reviewed the triage vital signs and the nursing notes.  Pertinent labs & imaging results that were available during my care of the patient were reviewed by me and considered in my medical decision making (see chart for details).    MDM Rules/Calculators/A&P                         84 year old male with history of heart failure, chronic respiratory failure on 3 to 3 L nasal cannula presented to ER with worsening shortness of breath, hypoxia.  Here, patient was not in distress but required up to 6 L nasal cannula to maintain saturation above 90%.  No significant edema of his lower legs but his chest x-ray did show some likely edema on his lungs.  CTA negative for PE or focal consolidation but did demonstrate findings consistent with interstitial edema.  Suspect heart failure is main driver presentation today.  Provided Lasix, consulted hospitalist for admission.  [place] to admit.  Final Clinical Impression(s) / ED Diagnoses Final diagnoses:  Heart failure, unspecified HF chronicity, unspecified heart failure type (HCC)  Acute respiratory failure with hypoxia (HCC)  Acute on chronic respiratory failure with hypoxia [facility])    Rx / DC Orders ED Discharge Orders    None       Lucrezia Starch, MD 12/05/19 2125

## 2019-12-06 ENCOUNTER — Ambulatory Visit (HOSPITAL_COMMUNITY): Payer: Medicare HMO | Admitting: Emergency Medicine

## 2019-12-06 ENCOUNTER — Inpatient Hospital Stay (HOSPITAL_COMMUNITY): Payer: Medicare HMO

## 2019-12-06 ENCOUNTER — Encounter (HOSPITAL_COMMUNITY): Payer: Self-pay | Admitting: Internal Medicine

## 2019-12-06 DIAGNOSIS — I639 Cerebral infarction, unspecified: Secondary | ICD-10-CM | POA: Diagnosis not present

## 2019-12-06 DIAGNOSIS — I5043 Acute on chronic combined systolic (congestive) and diastolic (congestive) heart failure: Secondary | ICD-10-CM

## 2019-12-06 DIAGNOSIS — I5021 Acute systolic (congestive) heart failure: Secondary | ICD-10-CM

## 2019-12-06 LAB — CBC WITH DIFFERENTIAL/PLATELET
Abs Immature Granulocytes: 0.08 10*3/uL — ABNORMAL HIGH (ref 0.00–0.07)
Basophils Absolute: 0.1 10*3/uL (ref 0.0–0.1)
Basophils Relative: 1 %
Eosinophils Absolute: 0.3 10*3/uL (ref 0.0–0.5)
Eosinophils Relative: 3 %
HCT: 47.7 % (ref 39.0–52.0)
Hemoglobin: 16.1 g/dL (ref 13.0–17.0)
Immature Granulocytes: 1 %
Lymphocytes Relative: 14 %
Lymphs Abs: 1.4 10*3/uL (ref 0.7–4.0)
MCH: 33.8 pg (ref 26.0–34.0)
MCHC: 33.8 g/dL (ref 30.0–36.0)
MCV: 100 fL (ref 80.0–100.0)
Monocytes Absolute: 0.8 10*3/uL (ref 0.1–1.0)
Monocytes Relative: 9 %
Neutro Abs: 7 10*3/uL (ref 1.7–7.7)
Neutrophils Relative %: 72 %
Platelets: 321 10*3/uL (ref 150–400)
RBC: 4.77 MIL/uL (ref 4.22–5.81)
RDW: 16.7 % — ABNORMAL HIGH (ref 11.5–15.5)
WBC: 9.6 10*3/uL (ref 4.0–10.5)
nRBC: 0 % (ref 0.0–0.2)

## 2019-12-06 LAB — MAGNESIUM: Magnesium: 2 mg/dL (ref 1.7–2.4)

## 2019-12-06 LAB — COMPREHENSIVE METABOLIC PANEL
ALT: 18 U/L (ref 0–44)
AST: 30 U/L (ref 15–41)
Albumin: 3.6 g/dL (ref 3.5–5.0)
Alkaline Phosphatase: 58 U/L (ref 38–126)
Anion gap: 9 (ref 5–15)
BUN: 16 mg/dL (ref 8–23)
CO2: 27 mmol/L (ref 22–32)
Calcium: 9.5 mg/dL (ref 8.9–10.3)
Chloride: 101 mmol/L (ref 98–111)
Creatinine, Ser: 1.35 mg/dL — ABNORMAL HIGH (ref 0.61–1.24)
GFR calc Af Amer: 56 mL/min — ABNORMAL LOW (ref >60)
GFR calc non Af Amer: 49 mL/min — ABNORMAL LOW (ref >60)
Glucose, Bld: 120 mg/dL — ABNORMAL HIGH (ref 70–99)
Potassium: 4 mmol/L (ref 3.5–5.1)
Sodium: 137 mmol/L (ref 135–145)
Total Bilirubin: 1.3 mg/dL — ABNORMAL HIGH (ref 0.3–1.2)
Total Protein: 7.5 g/dL (ref 6.5–8.1)

## 2019-12-06 LAB — TSH: TSH: 1.728 u[IU]/mL (ref 0.350–4.500)

## 2019-12-06 LAB — ECHOCARDIOGRAM COMPLETE
Area-P 1/2: 2 cm2
Calc EF: 45.8 %
Height: 70.5 in
S' Lateral: 4.2 cm
Single Plane A2C EF: 44.3 %
Single Plane A4C EF: 50.8 %
Weight: 2147.2 oz

## 2019-12-06 LAB — CK: Total CK: 99 U/L (ref 49–397)

## 2019-12-06 LAB — PHOSPHORUS: Phosphorus: 3.4 mg/dL (ref 2.5–4.6)

## 2019-12-06 LAB — TROPONIN I (HIGH SENSITIVITY): Troponin I (High Sensitivity): 14 ng/L (ref <18)

## 2019-12-06 MED ORDER — ENSURE ENLIVE PO LIQD
237.0000 mL | Freq: Two times a day (BID) | ORAL | Status: DC
  Administered 2019-12-07 – 2019-12-08 (×3): 237 mL via ORAL

## 2019-12-06 MED ORDER — ADULT MULTIVITAMIN W/MINERALS CH
1.0000 | ORAL_TABLET | Freq: Every day | ORAL | Status: DC
  Administered 2019-12-06 – 2019-12-09 (×4): 1 via ORAL
  Filled 2019-12-06 (×4): qty 1

## 2019-12-06 MED ORDER — LEVOTHYROXINE SODIUM 112 MCG PO TABS: 112.0000 ug | ORAL_TABLET | Freq: Every day | ORAL | 0 refills | Status: DC

## 2019-12-06 NOTE — Evaluation (Signed)
Occupational Therapy Evaluation Patient Details Name: Earl Mueller MRN: [id number] DOB: 03/13/1936 Today's Date: 12/06/2019    History of Present Illness 84 year old male with history of CAD, PVD, chronic systolic CHF, prior CVAs most recent one couple of months ago with aphasia and right-sided weakness, hyperlipidemia, hypothyroidism, COPD on chronic oxygen 3 L at home came into the hospital with shortness of breath has been progressive over the last couple of months but worse in the last few days.  In the ED he was found to be hypoxic requiring up titration of his oxygen to 6 L, CT angiogram was negative for PE however it did show fluid overload.    Clinical Impression   Patient admitted with the above diagnosis.  Patient presents with dyspnea on exertion, but overall, is functioning at or near his stated prior level of function.  Spouse admits to patient at his baseline as well.  Patient has just completed 5 weeks of [place] OT and PT after a recent stroke.  He and his spouse believe they have the needed DME at home.  He declines any further HH OT.  Reinforced energy conservation and self care seated taking rest breaks as needed.  Patient verbalizes understanding.  No further OT needs in the acute setting. No HH recommendations.      Follow Up Recommendations  No OT follow up    Equipment Recommendations    n/a   Recommendations for Other Services       Precautions / Restrictions Precautions Precautions: Fall Restrictions Weight Bearing Restrictions: No Other Position/Activity Restrictions: chronic O2      Mobility Bed Mobility Overal bed mobility: Modified Independent Bed Mobility: Supine to Sit     Supine to sit: Supervision     General bed mobility comments: use of SR's with HOB elevated  Transfers Overall transfer level: Modified independent Equipment used: None Transfers: Sit to/from Stand Sit to Stand: Modified independent (Device/Increase time)         General  transfer comment: Patient did reach for objects in his environment for stability.  One small LOB stepping back, but recovered w/o assist.  Able to step over obstacles and pick item off the floor w/o issues.    Balance Overall balance assessment: Needs assistance Sitting-balance support: No upper extremity supported;Feet supported Sitting balance-Leahy Scale: Good     Standing balance support: No upper extremity supported Standing balance-Leahy Scale: Fair Standing balance comment: can stand statically but cannot withstand challenges to balance.                            ADL either performed or assessed with clinical judgement   ADL Overall ADL's : At baseline                                       General ADL Comments: Patient able to simulate ADL sit/stand with no physical assist. Performed simulated toileting and perfromed grooming task standing at sink side. Patient will have 24 hour supervision and assist as needed at home.     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) Patient Visual Report: No change from baseline       Perception     Praxis      Pertinent Vitals/Pain Pain Assessment: No/denies pain     Hand Dominance Right   Extremity/Trunk Assessment Upper Extremity Assessment Upper Extremity  Assessment: Overall WFL for tasks assessed (Grossly 3+/5 MMT b UB)   Lower Extremity Assessment Lower Extremity Assessment: Generalized weakness   Cervical / Trunk Assessment Cervical / Trunk Assessment: Kyphotic   Communication Communication Communication: No difficulties   Cognition Arousal/Alertness: Awake/alert Behavior During Therapy: WFL for tasks assessed/performed Overall Cognitive Status: Within Functional Limits for tasks assessed                                     General Comments       Exercises Exercises: General Lower Extremity General Exercises - Lower Extremity Long Arc Quad:  AROM;Both;10 reps;Seated   Shoulder Instructions      Home Living Family/patient expects to be discharged to:: Private residence Living Arrangements: Spouse/significant other Available Help at Discharge: Family;Available 24 hours/day Type of Home: Other(Comment) (2 level townhome) Home Access: Stairs to enter     Home Layout: Two level Alternate Level Stairs-Number of Steps: stair lift   Bathroom Shower/Tub: Hospital doctor Toilet: Handicapped height     Home Equipment: Environmental consultant - 2 wheels;Cane - single point;Shower seat - built in;Grab bars - tub/shower          Prior Functioning/Environment Level of Independence: Independent        Comments: Spouse cares for most meal prep, community mobility and homemangement.        OT Problem List: Decreased activity tolerance      OT Treatment/Interventions:      OT Goals(Current goals can be found in the care plan section) Acute Rehab OT Goals Patient Stated Goal: Return home OT Goal Formulation: With patient/family Time For Goal Achievement: 12/08/19 Potential to Achieve Goals: Good  OT Frequency:     Barriers to D/C:  none found          Co-evaluation  n/a            AM-PAC OT "6 Clicks" Daily Activity     Outcome Measure Help from another person eating meals?: None Help from another person taking care of personal grooming?: None Help from another person toileting, which includes using toliet, bedpan, or urinal?: None Help from another person bathing (including washing, rinsing, drying)?: A Little Help from another person to put on and taking off regular upper body clothing?: None Help from another person to put on and taking off regular lower body clothing?: None 6 Click Score: 23   End of Session Nurse Communication:  (No HH OT needs)  Activity Tolerance: Patient tolerated treatment well;Other (comment) (DOE noted) Patient left: in bed;with call bell/phone within reach;with family/visitor  present  OT Visit Diagnosis: Unsteadiness on feet (R26.81)                Time: 3888-2800 OT Time Calculation (min): 17 min Charges:  OT General Charges $OT Visit: 1 Visit OT Evaluation $OT Eval Low Complexity: 1 Low  12/06/2019  Rich, OTR/L  Acute Rehabilitation Services  Office:  [place] 12/06/2019, 1:23 PM

## 2019-12-06 NOTE — Progress Notes (Signed)
Initial Nutrition Assessment  DOCUMENTATION CODES:   Non-severe (moderate) malnutrition in context of chronic illness  INTERVENTION:   -Ensure Enlive po BID, each supplement provides 350 kcal and 20 grams of protein -MVI with minerals daily -Educated pt and wife on low sodium diet. Provided "Low Sodium Nutrition Therapy" handout from [employer] Nutrition Care Manual  NUTRITION DIAGNOSIS:   Moderate Malnutrition related to chronic illness (CHF) as evidenced by percent weight loss, mild fat depletion, moderate fat depletion, mild muscle depletion, moderate muscle depletion.  GOAL:   Patient will meet greater than or equal to 90% of their needs  MONITOR:   PO intake, Supplement acceptance, Labs, Weight trends, Skin, I & O's  REASON FOR ASSESSMENT:   Consult Assessment of nutrition requirement/status, Diet education  ASSESSMENT:   84 y.o. 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   Reviewed I/O's: -1.2 L x 24 hours  UOP: 1.2 L x 24 hours  Spoke with pt and wife at bedside. Pt reports that he started losing weight approximately one month ago, estimating he has lost about 30 pounds over this time frame (pt has experienced a 10% wt loss over the past 3 months, which is significant for time frame). Pt wife notes that pt gets up late and often only consumes 2 meals per day. Pt has been eating a lot of fast food lately due to both of their own health issues (Breakfast: sausage, egg, and cheese biscuit from [employer]; Dinner: [employer]).   Pt denies a decrease in appetite, however, reports that it progressively took him a lot longer to eat secondary to shortness of breath. Noted meal completion 100%.  Pt reports consuming about a half of a chef salad and a cup of ice  cream for lunch.   Discussed importance of good meal and supplement intake to promote healing. Pt denies any changes in mobility (confirmed this also with OT at end of visit and that he recently finished some HHPT PTA).   RD provided "Low Sodium Nutrition Therapy" handout from the Academy of [employer]. Reviewed patient's dietary recall. Provided examples on ways to decrease sodium intake in diet. Discouraged intake of processed foods and use of salt shaker. Encouraged fresh fruits and vegetables as well as whole grain sources of carbohydrates to maximize fiber intake. RD discussed why it is important for patient to adhere to diet recommendations, and emphasized the role of fluids, foods to avoid, and importance of weighing self daily. Teach back method used. Expect fair compliance.   Medications reviewed and include lasix.   Labs reviewed.   NUTRITION - FOCUSED PHYSICAL EXAM:    Most Recent Value  Orbital Region Moderate depletion  Upper Arm Region Mild depletion  Thoracic and Lumbar Region Mild depletion  Buccal Region Moderate depletion  Temple Region Moderate depletion  Clavicle Bone Region Moderate depletion  Clavicle and Acromion Bone Region Moderate depletion  Scapular Bone Region Moderate depletion  Dorsal Hand Moderate depletion  Patellar Region Moderate depletion  Anterior Thigh Region Moderate depletion  Posterior Calf Region Moderate depletion  Edema (RD Assessment) None  Hair Reviewed  Eyes Reviewed  Mouth Reviewed  Skin Reviewed  Nails Reviewed       Diet Order:   Diet Order  Diet Heart Room service appropriate? Yes; Fluid consistency: Thin; Fluid restriction: 2000 mL Fluid  Diet effective now                 EDUCATION NEEDS:   Education needs have been addressed  Skin:  Skin Assessment: Reviewed RN Assessment  Last BM:  12/05/19  Height:   Ht Readings from Last 1 Encounters:  12/05/19 5' 10.5" (1.791 m)    Weight:   Wt  Readings from Last 1 Encounters:  12/06/19 60.9 kg    Ideal Body Weight:  76.8 kg  BMI:  Body mass index is 18.98 kg/m.  Estimated Nutritional Needs:   Kcal:  1850-2050  Protein:  90-105 grams  Fluid:  2 L    Loistine Chance, RD, LDN, [place] Registered Dietitian II Certified Diabetes Care and Education Specialist Please refer to [facility] for RD and/or RD on-call/weekend/after hours pager

## 2019-12-06 NOTE — Progress Notes (Signed)
PROGRESS NOTE  Earl Mueller PIR:[id number] DOB: [birth year] DOA: 12/05/2019 PCP: Rochel Brome, MD   LOS: 1 day   Brief Narrative / Interim history: 84 year old male with history of CAD, PVD, chronic systolic CHF, prior CVAs most recent one couple of months ago with aphasia and right-sided weakness, hyperlipidemia, hypothyroidism, COPD on chronic oxygen 3 L at home came into the hospital with shortness of breath has been progressive over the last couple of months but worse in the last few days.  In the ED he was found to be hypoxic requiring up titration of his oxygen to 6 L, CT angiogram was negative for PE however it did show fluid overload.  His BNP was significantly elevated.  Subjective / 24h Interval events: He is doing well this morning, appreciates breathing is about the same.  He has not been out.  He denies any weight gain at home  Assessment & Plan: Principal Problem Acute on chronic hypoxic respiratory failure due to acute pulmonary edema in the setting of acute on chronic combined systolic and diastolic CHF -Patient was admitted to the hospital, placed on IV Lasix, continue.  Monitor ins and outs, daily weights.  High-sensitivity troponin negative.  BNP significantly elevated at around 1100 on admission. -Remains hypoxic on 6 L this morning, wean off as tolerated  Active Problems Prior CVAs-most recent one 2 months ago, his right-sided weakness is completely resolved, he is now ambulatory without any assistance.  Continue aspirin and Plavix.  CAD with prior stenting-continue aspirin, Plavix, no chest pain.  A 2D echo is pending  Essential hypertension-continue home medications except for ARB to allow diuresis  Hypothyroidism-continue Synthroid  History of depression-continue paroxetine  Hyperlipidemia-continue atorvastatin, fenofibrate  COPD, pulmonary emphysema - on chronic 3 L nasal cannula at home, no wheezing  Chronic kidney disease stage IIIa-Baseline creatinine  around 1.2, continue to monitor while on Lasix.  Hold ARB as above.  Scheduled Meds: . aspirin EC  81 mg Oral Daily  . atorvastatin  80 mg Oral Daily  . clopidogrel  75 mg Oral Daily  . enoxaparin (LOVENOX) injection  40 mg Subcutaneous Q24H  . fenofibrate  160 mg Oral Daily  . furosemide  40 mg Intravenous Q12H  . levothyroxine  112 mcg Oral Daily  . metoprolol succinate  25 mg Oral Daily  . PARoxetine  20 mg Oral Daily  . sodium chloride flush  3 mL Intravenous Q12H  . sodium chloride flush  3 mL Intravenous Q12H   Continuous Infusions: . sodium chloride     PRN Meds:.sodium chloride, acetaminophen **OR** acetaminophen, albuterol, HYDROcodone-acetaminophen, ondansetron **OR** ondansetron (ZOFRAN) IV, sodium chloride flush  Diet Orders (From admission, onward)    Start     Ordered   12/05/19 2106  Diet Heart Room service appropriate? Yes; Fluid consistency: Thin  Diet effective now       Question Answer Comment  Room service appropriate? Yes   Fluid consistency: Thin      12/05/19 2106          DVT prophylaxis: enoxaparin (LOVENOX) injection 40 mg Start: 12/05/19 2115     Code Status: Full Code  Family Communication: no family at bedside   Status is: Inpatient  Remains inpatient appropriate because:Inpatient level of care appropriate due to severity of illness   Dispo: The patient is from: Home              Anticipated d/c is to: Home  Anticipated d/c date is: 1 day              Patient currently is not medically stable to d/c.  Consultants:  None   Procedures:  2D echo: pending  Microbiology  None   Antimicrobials: None     Objective: Vitals:   12/05/19 2305 12/05/19 2308 12/06/19 0022 12/06/19 0353  BP:  (!) 160/75 (!) 169/72 (!) 174/78  Pulse:  (!) 52 (!) 55 (!) 54  Resp:  17 19 15   Temp:  97.7 F (36.5 C) 97.9 F (36.6 C) 97.7 F (36.5 C)  TempSrc:  Oral Oral Oral  SpO2:  99% 94% 96%  Weight: 61.4 kg   60.9 kg  Height: 5'  10.5" (1.791 m)       Intake/Output Summary (Last 24 hours) at 12/06/2019 0710 Last data filed at 12/06/2019 0600 Gross per 24 hour  Intake 0 ml  Output 1225 ml  Net -1225 ml   Filed Weights   12/05/19 2305 12/06/19 0353  Weight: 61.4 kg 60.9 kg    Examination:  Constitutional: NAD Eyes: no scleral icterus ENMT: Mucous membranes are moist.  Neck: normal, supple Respiratory: Diffuse bibasilar crackles, normal respiratory effort.  No wheezing Cardiovascular: Regular rate and rhythm, no murmurs / rubs / gallops. No LE edema. Abdomen: non distended, no tenderness. Bowel sounds positive.  Musculoskeletal: no clubbing / cyanosis.  Skin: no rashes Neurologic: CN 2-12 grossly intact. Strength 5/5 in all 4.  Psychiatric: Normal judgment and insight. Alert and oriented x 3.   Data Reviewed: I have independently reviewed following labs and imaging studies   CBC: Recent Labs  Lab 12/05/19 1626 12/05/19 2054 12/06/19 0030  WBC 12.1*  --  9.6  NEUTROABS 9.0*  --  7.0  HGB 15.6 15.3 16.1  HCT 48.5 45.0 47.7  MCV 102.5*  --  100.0  PLT 316  --  741   Basic Metabolic Panel: Recent Labs  Lab 12/05/19 1626 12/05/19 2054 12/06/19 0030  NA 141 140 137  K 4.0 4.9 4.0  CL 107  --  101  CO2 24  --  27  GLUCOSE 88  --  120*  BUN 18  --  16  CREATININE 1.27*  --  1.35*  CALCIUM 9.3  --  9.5  MG  --   --  2.0  PHOS  --   --  3.4   Liver Function Tests: Recent Labs  Lab 12/06/19 0030  AST 30  ALT 18  ALKPHOS 58  BILITOT 1.3*  PROT 7.5  ALBUMIN 3.6   Coagulation Profile: No results for input(s): INR, PROTIME in the last 168 hours. HbA1C: No results for input(s): HGBA1C in the last 72 hours. CBG: No results for input(s): GLUCAP in the last 168 hours.  Recent Results (from the past 240 hour(s))  SARS Coronavirus 2 by RT PCR (hospital order, performed in [facility] hospital lab) Nasopharyngeal Nasopharyngeal Swab     Status: None   Collection Time: 12/05/19  4:35 PM     Specimen: Nasopharyngeal Swab  Result Value Ref Range Status   SARS Coronavirus 2 NEGATIVE NEGATIVE Final    Comment: (NOTE) SARS-CoV-2 target nucleic acids are NOT DETECTED.  The SARS-CoV-2 RNA is generally detectable in upper and lower respiratory specimens during the acute phase of infection. The lowest concentration of SARS-CoV-2 viral copies this assay can detect is 250 copies / mL. A negative result does not preclude SARS-CoV-2 infection and should not be used as  the sole basis for treatment or other patient management decisions.  A negative result may occur with improper specimen collection / handling, submission of specimen other than nasopharyngeal swab, presence of viral mutation(s) within the areas targeted by this assay, and inadequate number of viral copies (<250 copies / mL). A negative result must be combined with clinical observations, patient history, and epidemiological information.  Fact Sheet for Patients:   StrictlyIdeas.no  Fact Sheet for Healthcare Providers: BankingDealers.co.za  This test is not yet approved or  cleared by the [place] FDA and has been authorized for detection and/or diagnosis of SARS-CoV-2 by FDA under an Emergency Use Authorization (EUA).  This EUA will remain in effect (meaning this test can be used) for the duration of the COVID-19 declaration under Section 564(b)(1) of the Act, 21 U.S.C. section 360bbb-3(b)(1), unless the authorization is terminated or revoked sooner.  Performed at [facility], [place] [address] [place], [place] [address]      Radiology Studies: CT Angio Chest PE W and/or Wo Contrast  Result Date: 12/05/2019 CLINICAL DATA:  Hypoxia. EXAM: CT ANGIOGRAPHY CHEST WITH CONTRAST TECHNIQUE: Multidetector CT imaging of the chest was performed using the standard protocol during bolus administration of intravenous contrast. Multiplanar CT image reconstructions and  MIPs were obtained to evaluate the vascular anatomy. CONTRAST:  10mL OMNIPAQUE IOHEXOL 350 MG/ML SOLN COMPARISON:  September 13, 2019. FINDINGS: Cardiovascular: Satisfactory opacification of the pulmonary arteries to the segmental level. No evidence of pulmonary embolism. Normal heart size. No pericardial effusion. Coronary artery calcifications are noted. Atherosclerosis of thoracic aorta is noted. Mediastinum/Nodes: Thyroid gland is unremarkable. Esophagus is unremarkable. Stable enlarged mediastinal adenopathy is noted most likely is reactive in etiology. Lungs/Pleura: Moderate bilateral pleural effusions are noted with adjacent subsegmental atelectasis. No pneumothorax is noted. Emphysematous disease is noted bilaterally. Increased interstitial densities are noted throughout both lungs concerning for possible pulmonary edema. Upper Abdomen: No acute abnormality. Musculoskeletal: No chest wall abnormality. No acute or significant osseous findings. Review of the MIP images confirms the above findings. IMPRESSION: 1. No definite evidence of pulmonary embolus. 2. Coronary artery calcifications are noted suggesting coronary artery disease. 3. Moderate bilateral pleural effusions are noted with adjacent subsegmental atelectasis. 4. Increased interstitial densities are noted throughout both lungs concerning for possible pulmonary edema. 5. Stable enlarged mediastinal adenopathy is noted most likely reactive in etiology. 6. Emphysema and aortic atherosclerosis. Aortic Atherosclerosis (ICD10-I70.0) and Emphysema (ICD10-J43.9). Electronically Signed   By: Marijo Conception M.D.   On: 12/05/2019 19:16   DG Chest Portable 1 View  Result Date: 12/05/2019 CLINICAL DATA:  Dyspnea on exertion. EXAM: PORTABLE CHEST 1 VIEW COMPARISON:  September 16, 2019. FINDINGS: The heart size and mediastinal contours are within normal limits. No pneumothorax or pleural effusion is noted. Increased reticular densities are noted throughout both lungs  concerning for worsening edema or atypical inflammation superimposed upon chronic interstitial lung disease. The visualized skeletal structures are unremarkable. IMPRESSION: Increased reticular densities are noted throughout both lungs concerning for worsening edema or atypical inflammation superimposed upon chronic interstitial lung disease. Aortic Atherosclerosis (ICD10-I70.0). Electronically Signed   By: Marijo Conception M.D.   On: 12/05/2019 16:48   Marzetta Board, MD, PhD [facility]  Between 7 am - 7 pm I am available, please contact me via Amion or Securechat  Between 7 pm - 7 am I am not available, please contact night coverage MD/APP via Amion

## 2019-12-06 NOTE — Progress Notes (Cosign Needed)
Weaned pt from 6L to 4L O2 sat dropped to 84, then placed on 5L with O2 remaining above 90

## 2019-12-06 NOTE — Discharge Instructions (Addendum)

## 2019-12-06 NOTE — Evaluation (Signed)
Physical Therapy Evaluation Patient Details Name: Earl Mueller MRN: [id number] DOB: 11/20/1935 Today's Date: 12/06/2019   History of Present Illness  84 year old male with history of CAD, PVD, chronic systolic CHF, prior CVAs most recent one couple of months ago with aphasia and right-sided weakness, hyperlipidemia, hypothyroidism, COPD on chronic oxygen 3 L at home came into the hospital with shortness of breath has been progressive over the last couple of months but worse in the last few days.  In the ED he was found to be hypoxic requiring up titration of his oxygen to 6 L, CT angiogram was negative for PE however it did show fluid overload.   Clinical Impression  Pt admitted with above diagnosis. Pt was able to ambulate without device with min assist as he did have one small LOB.  Requires O2 as well at 6L to keep sats >85%.  Pt was on 3L PTA.  Pt and wife feel that they can manage at home with HHPT and pt has RW he can use for balance.  Will continue to follow acutely.  Pt currently with functional limitations due to balance and endurance deficits. Pt will benefit from skilled PT to increase their independence and safety with mobility to allow discharge to the venue listed below.      Follow Up Recommendations Home health PT;Supervision/Assistance - 24 hour    Equipment Recommendations  None recommended by PT    Recommendations for Other Services       Precautions / Restrictions Precautions Precautions: Fall Restrictions Weight Bearing Restrictions: No      Mobility  Bed Mobility Overal bed mobility: Needs Assistance Bed Mobility: Supine to Sit     Supine to sit: Supervision     General bed mobility comments: incr time to come to EOB and needs rest breaks due to DOE 1/4  Transfers Overall transfer level: Needs assistance Equipment used: None Transfers: Sit to/from Stand Sit to Stand: Min guard         General transfer comment: ABle to come to stand with min gaurd  assist. Incr time as pt used momentum.   Ambulation/Gait Ambulation/Gait assistance: Min guard;Min assist Gait Distance (Feet): 25 Feet Assistive device: 1 person hand held assist Gait Pattern/deviations: Step-through pattern;Decreased stride length;Staggering left;Trunk flexed;Shuffle   Gait velocity interpretation: <1.31 ft/sec, indicative of household ambulator General Gait Details: Pt was able to ambulate in room to the door and back to the chair. Pt with one LOB needing assist to steady.  Pt on 6LO2 and sats at end of walk were 86% and recovered to 93% within 3 min.  Stairs            Wheelchair Mobility    Modified Rankin (Stroke Patients Only)       Balance Overall balance assessment: Needs assistance Sitting-balance support: No upper extremity supported;Feet supported Sitting balance-Leahy Scale: Fair     Standing balance support: During functional activity;Single extremity supported;No upper extremity supported Standing balance-Leahy Scale: Fair Standing balance comment: can stand statically but cannot withstand challenges to balance.                              Pertinent Vitals/Pain Pain Assessment: No/denies pain    Home Living Family/patient expects to be discharged to:: Private residence Living Arrangements: Spouse/significant other Available Help at Discharge: Family;Available 24 hours/day Type of Home: House Home Access: Stairs to enter   [employer] of Steps: 1 Home Layout: Two  level (chair lift) Home Equipment: Walker - 2 wheels;Cane - single point;Shower seat - built in      Prior Function Level of Independence: Independent               Hand Dominance   Dominant Hand: Right    Extremity/Trunk Assessment   Upper Extremity Assessment Upper Extremity Assessment: Defer to OT evaluation    Lower Extremity Assessment Lower Extremity Assessment: Generalized weakness    Cervical / Trunk Assessment Cervical /  Trunk Assessment: Kyphotic  Communication   Communication: HOH  Cognition Arousal/Alertness: Awake/alert Behavior During Therapy: WFL for tasks assessed/performed Overall Cognitive Status: Within Functional Limits for tasks assessed                                        General Comments      Exercises General Exercises - Lower Extremity Long Arc Quad: AROM;Both;10 reps;Seated   Assessment/Plan    PT Assessment Patient needs continued PT services  PT Problem List Decreased activity tolerance;Decreased balance;Decreased mobility;Decreased knowledge of use of DME;Decreased safety awareness;Decreased knowledge of precautions;Cardiopulmonary status limiting activity       PT Treatment Interventions DME instruction;Gait training;Functional mobility training;Therapeutic activities;Therapeutic exercise;Balance training;Patient/family education    PT Goals (Current goals can be found in the Care Plan section)  Acute Rehab PT Goals Patient Stated Goal: to go home PT Goal Formulation: With patient Time For Goal Achievement: 12/20/19 Potential to Achieve Goals: Good    Frequency Min 3X/week   Barriers to discharge        Co-evaluation               AM-PAC PT "6 Clicks" Mobility  Outcome Measure Help needed turning from your back to your side while in a flat bed without using bedrails?: None Help needed moving from lying on your back to sitting on the side of a flat bed without using bedrails?: None Help needed moving to and from a bed to a chair (including a wheelchair)?: A Little Help needed standing up from a chair using your arms (e.g., wheelchair or bedside chair)?: A Little Help needed to walk in hospital room?: A Little Help needed climbing 3-5 steps with a railing? : A Little 6 Click Score: 20    End of Session Equipment Utilized During Treatment: Gait belt;Oxygen Activity Tolerance: Patient limited by fatigue Patient left: in chair;with call  bell/phone within reach;with chair alarm set;with family/visitor present Nurse Communication: Mobility status PT Visit Diagnosis: Unsteadiness on feet (R26.81);Muscle weakness (generalized) (M62.81)    Time: 1829-9371 PT Time Calculation (min) (ACUTE ONLY): 18 min   Charges:   PT Evaluation $PT Eval Moderate Complexity: 1 Mod          Mahasin Riviere W,PT Acute Rehabilitation Services Pager:  [phone]  Office:  [place] 12/06/2019, 11:18 AM

## 2019-12-06 NOTE — Progress Notes (Signed)
  Echocardiogram 2D Echocardiogram has been performed.  Earl Mueller 12/06/2019, 11:26 AM

## 2019-12-06 NOTE — Plan of Care (Signed)

## 2019-12-06 NOTE — Consult Note (Addendum)
Cardiology Consultation:   Patient ID: Earl Mueller MRN: [id number]; DOB: 10-30-1935  Admit date: 12/05/2019 Date of Consult: 12/06/2019  Primary Care Provider: Rochel Brome, MD [facility] Cardiologist: Jenean Lindau, MD  [facility] Electrophysiologist:  Virl Axe, MD   Patient Profile:   Earl Mueller is a 84 y.o. 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 Cruzita Lederer.  History of Present Illness:   The patient has a previous history of nonobstructive CAD until recently not requiring PCI. He was recently admitted 09/2019 with acute CHF with EF 39% and unstable angina. He underwent cath showing 2v obstructive disease resulting in orbital atherectomy/DES to prox LAD with staged atherectomy/DES to prox LCx. He demonstrated desaturations with ambulation so required 3L with exertion at dicsharge. He was placed on ARB at that time and continued on Toprol with recommendation for OP med titration. He saw Dr. Geraldo Pitter in followup and was felt to be doing well. He was readmitted soon after with multifocal cerebral infarct and underwent loop recorder implantation by EP. Neurology deferred TEE. Repeat echo 09/22/19 showed LVEF 35-40% without significant change from the one done earlier that month, otherwise grade 1 DD,. Per neurology's note, his "long term BP goal is 130-150 with high grade stenosis bilateral ICA siphon and right petrous ICA." He was continued on DAPT.  He presented back to the hospital 12/05/2019 with worsening SOB for the past 2 months, found to have hypoxia to 53% RA upon arrival to outpatient doctors office. Pulse ox  remained 65% on 3L therefore placed on 6L with improvement to 88-92%. In retrospect he says this was steadily worsening the last few weeks. He denied any chest pain, orthopnea or edema. He had been using O2 as previously advised. His wife indicates he eats a lot of salt. In the ED CTA showed no PE but did show concern for pulmonary edema and moderate bilateral effusions. Covid swab negative. Labs otherwise notable for BNP 1176, troponin negative x 4, mild leukocytosis of 12k, normal TSH, albumin 3.6, Cr 1.27-1.35 c/w prior values. He has been placed on IV Lasix. ARB has been held given CT angio and need for diuresis.   He has lost weight since this summer, citing he eats a lot but not consistently, but otherwise doesn't know why he's losing. Weights 135->134.2 this admission, previously 147lb as OP in 09/2019. He also developed hoarse voice about a week ago. No fevers/chills.  Past Medical History:  Diagnosis Date  . Anxiety   . Arthritis   . CAD (coronary artery disease)    a.  s/p DES to LAD/LCx in 09/2019.  . Carotid artery stenosis    a. s/p L CEA 2019. b. 09/2019: Per neurology's note, his long term BP goal is 130-150 with high grade stenosis bilateral ICA siphon and right petrous ICA.  Marland Kitchen Chronic combined systolic and diastolic CHF (congestive heart failure) ([place])   . Chronic respiratory failure ([place])   . CKD (chronic kidney disease), stage III   . COPD (chronic obstructive pulmonary disease) ([place])   . CVA (cerebral vascular accident) ([place])   . Dyslipidemia 11/20/2014  . Essential hypertension   .  Former tobacco use   . Hypothyroidism   . Ischemic cardiomyopathy   . Peripheral vascular disease ([place])    a. s/p aortobifem BPG 2007.  . Skin ulcer of scalp ([place]) 05/10/2013  . Symptomatic carotid artery stenosis 03/05/2017    Past Surgical History:  Procedure Laterality Date  . BACK SURGERY    . CARDIAC CATHETERIZATION  03/06/10   NL-LM, 40%pLAD, 30% mid/distal LAD, 98% ostial DIAG (small),  30%pCx, 50%mCx, 30%dCx, 30% OM2, 40%dRCA; Med RX rec ([facility])  . caritid endart    . CORONARY ATHERECTOMY N/A 09/14/2019   Procedure: CORONARY ATHERECTOMY;  Surgeon: Martinique, Peter M, MD;  Location: [place] CV LAB;  Service: Cardiovascular;  Laterality: N/A;  . CORONARY ATHERECTOMY N/A 09/15/2019   Procedure: CORONARY ATHERECTOMY;  Surgeon: Martinique, Peter M, MD;  Location: [place] CV LAB;  Service: Cardiovascular;  Laterality: N/A;  . CORONARY STENT INTERVENTION N/A 09/14/2019   Procedure: CORONARY STENT INTERVENTION;  Surgeon: Martinique, Peter M, MD;  Location: [place] CV LAB;  Service: Cardiovascular;  Laterality: N/A;  . CORONARY STENT INTERVENTION N/A 09/15/2019   Procedure: CORONARY STENT INTERVENTION;  Surgeon: Martinique, Peter M, MD;  Location: [place] CV LAB;  Service: Cardiovascular;  Laterality: N/A;  . ENDARTERECTOMY Left 03/26/2017   Procedure: ENDARTERECTOMY CAROTID LEFT;  Surgeon: Rosetta Posner, MD;  Location[facility];  Service: Vascular;  Laterality: Left;  . femerao bypass    . LEFT HEART CATH AND CORONARY ANGIOGRAPHY N/A 02/03/2017   Procedure: LEFT HEART CATH AND CORONARY ANGIOGRAPHY;  Surgeon: Martinique, Peter M, MD;  Location: [place] CV LAB;  Service: Cardiovascular;  Laterality: N/A;  . LEFT HEART CATH AND CORONARY ANGIOGRAPHY N/A 09/14/2019   Procedure: LEFT HEART CATH AND CORONARY ANGIOGRAPHY;  Surgeon: Martinique, Peter M, MD;  Location: [place] CV LAB;  Service: Cardiovascular;  Laterality: N/A;  . LOOP RECORDER INSERTION N/A 09/25/2019   Procedure: LOOP RECORDER INSERTION;  Surgeon: Deboraha Sprang, MD;  Location: [place] CV LAB;  Service: Cardiovascular;  Laterality: N/A;  . PATCH ANGIOPLASTY Left 03/26/2017   Procedure: PATCH ANGIOPLASTY LEFT CAROTID ARTERY;  Surgeon: Rosetta Posner, MD;  Location: [place];  Service: Vascular;  Laterality: Left;  . SCALP LACERATION REPAIR N/A 05/23/2013   Procedure: EXCISION SCALP ULCER DEBRIDEMENT OF SKIN AND BONE WITH PLACEMENT OF A-CELL;   Surgeon: Irene Limbo, MD;  Location: [place];  Service: Plastics;  Laterality: N/A;  . STOMACH SURGERY     x2  . VASCULAR SURGERY     AFBG 10/15/05     Home Medications:  Prior to Admission medications   Medication Sig Start Date End Date Taking? Authorizing Provider  aspirin EC 81 MG tablet Take 81 mg by mouth daily.   Yes [provider]  atorvastatin (LIPITOR) 80 MG tablet TAKE 1 TABLET EVERY DAY Patient taking differently: Take 80 mg by mouth daily.  12/05/19  Yes Marge Duncans, PA-C  Budeson-Glycopyrrol-Formoterol (BREZTRI AEROSPHERE) 160-9-4.8 MCG/ACT AERO Inhale 2 puffs into the lungs in the morning and at bedtime. 12/02/19  Yes Corum, Rex Kras, MD  cilostazol (PLETAL) 100 MG tablet Take 100 mg by mouth 2 (two) times daily.   Yes [provider]  clopidogrel (PLAVIX) 75 MG tablet TAKE 1 TABLET EVERY DAY Patient taking differently: Take 75 mg by mouth daily.  05/02/19  Yes Cox, Kirsten, MD  fenofibrate 160 MG tablet Take 160 mg by mouth daily.   Yes [provider]  levothyroxine (SYNTHROID) 112 MCG  tablet Take 1 tablet (112 mcg total) by mouth daily. 08/31/19  Yes Cox, Kirsten, MD  losartan (COZAAR) 25 MG tablet Take 1 tablet (25 mg total) by mouth daily. Patient taking differently: Take 12.5 mg by mouth daily.  09/17/19  Yes Georgette Shell, MD  metoprolol succinate (TOPROL-XL) 25 MG 24 hr tablet Take 1 tablet (25 mg total) by mouth daily. 09/17/19  Yes Georgette Shell, MD  Multiple Vitamins-Minerals (CENTRUM SILVER PO) Take 1 tablet by mouth daily.   Yes [provider]  PARoxetine (PAXIL) 20 MG tablet TAKE 1 TABLET EVERY DAY Patient taking differently: Take 20 mg by mouth daily.  10/09/19  Yes Cox, Kirsten, MD  Acetaminophen 500 MG capsule Take 2 capsules (1,000 mg total) by mouth every 6 (six) hours as needed for fever or pain. Patient not taking: Reported on 12/06/2019 09/25/19   Donzetta Starch, NP  nitroGLYCERIN (NITROSTAT) 0.4 MG SL tablet  Place 0.4 mg under the tongue every 5 (five) minutes as needed for chest pain.    [provider]    Inpatient Medications: Scheduled Meds: . aspirin EC  81 mg Oral Daily  . atorvastatin  80 mg Oral Daily  . clopidogrel  75 mg Oral Daily  . enoxaparin (LOVENOX) injection  40 mg Subcutaneous Q24H  . fenofibrate  160 mg Oral Daily  . furosemide  40 mg Intravenous Q12H  . levothyroxine  112 mcg Oral Daily  . metoprolol succinate  25 mg Oral Daily  . PARoxetine  20 mg Oral Daily  . sodium chloride flush  3 mL Intravenous Q12H  . sodium chloride flush  3 mL Intravenous Q12H   Continuous Infusions: . sodium chloride     PRN Meds: sodium chloride, acetaminophen **OR** acetaminophen, albuterol, HYDROcodone-acetaminophen, ondansetron **OR** ondansetron (ZOFRAN) IV, sodium chloride flush  Allergies:    Allergies  Allergen Reactions  . Other Other (See Comments)    Any narcotic makes him a "wild man"  Delusions (intolerance)    Social History:   Social History   Socioeconomic History  . Marital status: Married    Spouse name: Not on file  . Number of children: Not on file  . Years of education: Not on file  . Highest education level: Not on file  Occupational History  . Not on file  Tobacco Use  . Smoking status: Former Smoker    Quit date: 12/18/2007    Years since quitting: 11.9  . Smokeless tobacco: Never Used  Vaping Use  . Vaping Use: Never used  Substance and Sexual Activity  . Alcohol use: Yes    Comment: occ  . Drug use: No  . Sexual activity: Not on file  Other Topics Concern  . Not on file  Social History Narrative  . Not on file   Social Determinants of Health   Financial Resource Strain:   . Difficulty of Paying Living Expenses: Not on file  Food Insecurity: No Food Insecurity  . Worried About Charity fundraiser in the Last Year: Never true  . Ran Out of Food in the Last Year: Never true  Transportation Needs: No Transportation Needs  .  Lack of Transportation (Medical): No  . Lack of Transportation (Non-Medical): No  Physical Activity: Inactive  . Days of Exercise per Week: 0 days  . Minutes of Exercise per Session: 0 min  Stress:   . Feeling of Stress : Not on file  Social Connections:   . Frequency of Communication with  Friends and Family: Not on file  . Frequency of Social Gatherings with Friends and Family: Not on file  . Attends Religious Services: Not on file  . Active Member of Clubs or Organizations: Not on file  . Attends Archivist Meetings: Not on file  . Marital Status: Not on file  Intimate Partner Violence:   . Fear of Current or Ex-Partner: Not on file  . Emotionally Abused: Not on file  . Physically Abused: Not on file  . Sexually Abused: Not on file    Family History:    Family History  Problem Relation Age of Onset  . Heart failure Father      ROS:  Please see the history of present illness.   All other ROS reviewed and negative.     Physical Exam/Data:   Vitals:   12/06/19 0353 12/06/19 0744 12/06/19 0752 12/06/19 0756  BP: (!) 174/78 (!) 160/61 (!) 160/61   Pulse: (!) 54 60 (!) 58 61  Resp: 15  20   Temp: 97.7 F (36.5 C)  98.2 F (36.8 C)   TempSrc: Oral  Oral   SpO2: 96%  94%   Weight: 60.9 kg     Height:        Intake/Output Summary (Last 24 hours) at 12/06/2019 1124 Last data filed at 12/06/2019 0900 Gross per 24 hour  Intake 123 ml  Output 2000 ml  Net -1877 ml   Last 3 Weights 12/06/2019 12/05/2019 11/30/2019  Weight (lbs) 134 lb 3.2 oz 135 lb 6.4 oz 142 lb  Weight (kg) 60.873 kg 61.417 kg 64.411 kg     Body mass index is 18.98 kg/m.  Vital Signs. BP (!) 160/61 (BP Location: Right Arm)   Pulse 61   Temp 98.2 F (36.8 C) (Oral)   Resp 20   Ht 5' 10.5" (1.791 m)   Wt 60.9 kg   SpO2 94%   BMI 18.98 kg/m  General: Well developed thin WM in no acute distress. Lying flat for echocardiogram without acute distress Head: Normocephalic, atraumatic, sclera  non-icteric, no xanthomas, nares are without discharge. Neck: Negative for carotid bruits. JVP not elevated. Lungs: Diminished bilaterally at bases without wheezes, rales, or rhonchi. Breathing is unlabored. Heart: RRR S1 S2 without murmurs, rubs, or gallops.  Abdomen: Soft, non-tender, non-distended with normoactive bowel sounds. No rebound/guarding. Extremities: No clubbing or cyanosis. No edema. Distal pedal pulses are 2+ and equal bilaterally. Neuro: Alert and oriented X 3. Moves all extremities spontaneously. Psych:  Responds to questions appropriately with a normal affect.   EKG:  The EKG was personally reviewed and demonstrates:  NSR 62bpm, baseline wander present, prior anteroseptal infarct, nonspecific TW changes, QTc 420ms  Telemetry:  Telemetry was personally reviewed and demonstrates: NSR/SB, 12 beat run WCT/suspected NSVT  Relevant CV Studies: 2D echo 09/22/19  1. LV function is depressed with hypokinesis of the inferior,  inferolateral walls.   Compared to echo images from previous echo 09/14/19 there is no  significant change. Left ventricular ejection fraction, by estimation, is  35 to 40%. The left ventricle has moderately decreased function. Left  ventricular diastolic parameters are  consistent with Grade I diastolic dysfunction (impaired relaxation).  2. Right ventricular systolic function is normal. The right ventricular  size is normal. There is normal pulmonary artery systolic pressure.  3. The mitral valve is abnormal. Trivial mitral valve regurgitation.  4. The aortic valve is abnormal. Aortic valve regurgitation is mild. Mild  aortic valve sclerosis is present,  with no evidence of aortic valve  stenosis.  5. The inferior vena cava is normal in size with greater than 50%  respiratory variability, suggesting right atrial pressure of 3 mmHg.   Laboratory Data:  High Sensitivity Troponin:   Recent Labs  Lab 12/05/19 1626 12/05/19 1900 12/05/19 2040  12/06/19 0030  TROPONINIHS 12 10 12 14      Chemistry Recent Labs  Lab 12/05/19 1626 12/05/19 2054 12/06/19 0030  NA 141 140 137  K 4.0 4.9 4.0  CL 107  --  101  CO2 24  --  27  GLUCOSE 88  --  120*  BUN 18  --  16  CREATININE 1.27*  --  1.35*  CALCIUM 9.3  --  9.5  GFRNONAA 52*  --  49*  GFRAA >60  --  56*  ANIONGAP 10  --  9    Recent Labs  Lab 12/06/19 0030  PROT 7.5  ALBUMIN 3.6  AST 30  ALT 18  ALKPHOS 58  BILITOT 1.3*   Hematology Recent Labs  Lab 12/05/19 1626 12/05/19 2054 12/06/19 0030  WBC 12.1*  --  9.6  RBC 4.73  --  4.77  HGB 15.6 15.3 16.1  HCT 48.5 45.0 47.7  MCV 102.5*  --  100.0  MCH 33.0  --  33.8  MCHC 32.2  --  33.8  RDW 16.6*  --  16.7*  PLT 316  --  321   BNP Recent Labs  Lab 12/05/19 1626  BNP 1,176.6*    DDimer No results for input(s): DDIMER in the last 168 hours.   Radiology/Studies:  CT Angio Chest PE W and/or Wo Contrast  Result Date: 12/05/2019 CLINICAL DATA:  Hypoxia. EXAM: CT ANGIOGRAPHY CHEST WITH CONTRAST TECHNIQUE: Multidetector CT imaging of the chest was performed using the standard protocol during bolus administration of intravenous contrast. Multiplanar CT image reconstructions and MIPs were obtained to evaluate the vascular anatomy. CONTRAST:  91mL OMNIPAQUE IOHEXOL 350 MG/ML SOLN COMPARISON:  September 13, 2019. FINDINGS: Cardiovascular: Satisfactory opacification of the pulmonary arteries to the segmental level. No evidence of pulmonary embolism. Normal heart size. No pericardial effusion. Coronary artery calcifications are noted. Atherosclerosis of thoracic aorta is noted. Mediastinum/Nodes: Thyroid gland is unremarkable. Esophagus is unremarkable. Stable enlarged mediastinal adenopathy is noted most likely is reactive in etiology. Lungs/Pleura: Moderate bilateral pleural effusions are noted with adjacent subsegmental atelectasis. No pneumothorax is noted. Emphysematous disease is noted bilaterally. Increased interstitial  densities are noted throughout both lungs concerning for possible pulmonary edema. Upper Abdomen: No acute abnormality. Musculoskeletal: No chest wall abnormality. No acute or significant osseous findings. Review of the MIP images confirms the above findings. IMPRESSION: 1. No definite evidence of pulmonary embolus. 2. Coronary artery calcifications are noted suggesting coronary artery disease. 3. Moderate bilateral pleural effusions are noted with adjacent subsegmental atelectasis. 4. Increased interstitial densities are noted throughout both lungs concerning for possible pulmonary edema. 5. Stable enlarged mediastinal adenopathy is noted most likely reactive in etiology. 6. Emphysema and aortic atherosclerosis. Aortic Atherosclerosis (ICD10-I70.0) and Emphysema (ICD10-J43.9). Electronically Signed   By: Marijo Conception M.D.   On: 12/05/2019 19:16   DG Chest Portable 1 View  Result Date: 12/05/2019 CLINICAL DATA:  Dyspnea on exertion. EXAM: PORTABLE CHEST 1 VIEW COMPARISON:  September 16, 2019. FINDINGS: The heart size and mediastinal contours are within normal limits. No pneumothorax or pleural effusion is noted. Increased reticular densities are noted throughout both lungs concerning for worsening edema or atypical inflammation superimposed  upon chronic interstitial lung disease. The visualized skeletal structures are unremarkable. IMPRESSION: Increased reticular densities are noted throughout both lungs concerning for worsening edema or atypical inflammation superimposed upon chronic interstitial lung disease. Aortic Atherosclerosis (ICD10-I70.0). Electronically Signed   By: Marijo Conception M.D.   On: 12/05/2019 16:48  { New York Heart Association (NYHA) Functional Class NYHA Class IV on arrival  Assessment and Plan:   1. Acute on chronic hypoxic respiratory failure with acute on chronic combined CHF/ICM superimposed on underlying COPD - agree with continuation of IV Lasix as ordered - dry weight unclear  since he has been steadily losing weight - ARB on hold given recent CTA - can consider resuming soon vs transition to [facility] if renal function remains stable (need to remember that BP goal is 130-150 per prior neurology recommendations) - continue BB as tolerated - repeat echo pending - dietitian consult to assess nutritional needs, does need to lower salt in diet  2. Essential HTN - will discuss med plan with MD - it's possible poorly controlled HTN contributed to presentation but as above, goal BP 130-150 per neuro  3. CAD s/p recent PCI - no angina and troponins are negative - continue DAPT, statin, BB  4. History of strokes, s/p ILR 09/2019 - last interrogation 1 month ago, will request inpatient while he's here   5. CKD stage III - Cr remains close to baseline, follow with diuresis  6. Weight loss (also reports hoarseness for a week) - consult dietitian to assess needs - if this persists will need to consider medical workup  7. Brief NSVT (12 beats) - continue BB and follow lytes  For questions or updates, please contact [place] Please consult [email or web address] for contact info under    Signed, Charlie Pitter, PA-C  12/06/2019 11:24 AM   History and all data above reviewed.  Patient examined.  I agree with the findings as above.  The patient presents with progressive respiratory failure against the backdrop of severe comorbid conditions including chronic systolic and diastolic HF.  He has had appropriate medical therapy but is still having progressive volume retention.  Part of this is related to dietary indiscretion with salt and calories (incluiding a couple of beers daily.)  Of note I did review the echo.  Final reading is pending.  However, he has moderately reduced EF unchanged from previous.  The patient exam reveals COR:RRR  ,  Lungs: Bilateral crackles at the bases and 1/3 up ,  Abd: Positive bowel sounds, no rebound no guarding, Ext No edema  .  All available labs,  radiology testing, previous records reviewed. Agree with documented assessment and plan.   Acute on chronic systolic and diastolic HF:  I agree with education and dietary consult as port of this therapy.  This becomes even more important understanding that our medical therapies are limited by CKD and the need to keep his BP higher given severe extracranial PVD.  It does not appear that he has been on diuretic at home.  Also, he has not been weighing himself and he loves salt.  I had a long discussion with him about all of this and I think that he can work to do daily weights, salt and fluid restriction and PRN diuretic dosing and hopefully avoid acute on chronic exacerbations again.  For now continue IV diuresis.  Likely will transition to [facility] prior to discharge.   Laura Dianna Deshler  12:07 PM  12/06/2019

## 2019-12-07 DIAGNOSIS — E440 Moderate protein-calorie malnutrition: Secondary | ICD-10-CM | POA: Insufficient documentation

## 2019-12-07 HISTORY — DX: Moderate protein-calorie malnutrition: E44.0

## 2019-12-07 LAB — BASIC METABOLIC PANEL
Anion gap: 12 (ref 5–15)
BUN: 25 mg/dL — ABNORMAL HIGH (ref 8–23)
CO2: 25 mmol/L (ref 22–32)
Calcium: 9.7 mg/dL (ref 8.9–10.3)
Chloride: 100 mmol/L (ref 98–111)
Creatinine, Ser: 1.71 mg/dL — ABNORMAL HIGH (ref 0.61–1.24)
GFR calc Af Amer: 42 mL/min — ABNORMAL LOW (ref >60)
GFR calc non Af Amer: 36 mL/min — ABNORMAL LOW (ref >60)
Glucose, Bld: 113 mg/dL — ABNORMAL HIGH (ref 70–99)
Potassium: 3.5 mmol/L (ref 3.5–5.1)
Sodium: 137 mmol/L (ref 135–145)

## 2019-12-07 LAB — CBC
HCT: 49.9 % (ref 39.0–52.0)
Hemoglobin: 16.1 g/dL (ref 13.0–17.0)
MCH: 32.5 pg (ref 26.0–34.0)
MCHC: 32.3 g/dL (ref 30.0–36.0)
MCV: 100.8 fL — ABNORMAL HIGH (ref 80.0–100.0)
Platelets: 327 10*3/uL (ref 150–400)
RBC: 4.95 MIL/uL (ref 4.22–5.81)
RDW: 16.7 % — ABNORMAL HIGH (ref 11.5–15.5)
WBC: 7.9 10*3/uL (ref 4.0–10.5)
nRBC: 0 % (ref 0.0–0.2)

## 2019-12-07 MED ORDER — FUROSEMIDE 10 MG/ML IJ SOLN
40.0000 mg | Freq: Two times a day (BID) | INTRAMUSCULAR | Status: DC
  Administered 2019-12-07 – 2019-12-08 (×2): 40 mg via INTRAVENOUS
  Filled 2019-12-07 (×2): qty 4

## 2019-12-07 MED ORDER — ENOXAPARIN SODIUM 30 MG/0.3ML ~~LOC~~ SOLN
30.0000 mg | SUBCUTANEOUS | Status: DC
  Administered 2019-12-07: 30 mg via SUBCUTANEOUS
  Filled 2019-12-07: qty 0.3

## 2019-12-07 MED ORDER — SACUBITRIL-VALSARTAN 24-26 MG PO TABS
1.0000 | ORAL_TABLET | Freq: Two times a day (BID) | ORAL | Status: DC
  Administered 2019-12-07 (×2): 1 via ORAL
  Filled 2019-12-07 (×2): qty 1

## 2019-12-07 NOTE — Progress Notes (Signed)
Heart Failure Stewardship Pharmacist Progress Note   PCP: Rochel Brome, MD PCP-Cardiologist: Jenean Lindau, MD    HPI:  84 yo M with PMH of chronic combined CHF/ICM EF 35-40% (July 2021), prior CVAs, CAD s/p DES to LAD/LCx in 09/2019 and L CEA 2019, PAD, HTN, depression, hypothyroidism, HLD, COPD/emphysema with chronic respiratory failure, CKD stage IIIa, and former tobacco abuse. He presented to [facility] ED on 12/05/19 with shortness of breath. He was admitted for acute on chronic HF and acute on chronic respiratory failure with hypoxia. An ECHO was done on 12/06/19 and he was found to have stable LVEF from prior at 35-40%.  Current HF Medications: Furosemide 40 mg IV q12h Metoprolol XL 25 mg daily Entresto 24/26 mg BID  Prior to admission HF Medications: Metoprolol XL 25 mg daily Losartan 12.5 mg daily  Pertinent Lab Values: . Serum creatinine 1.71, BUN 25, Potassium 3.5, Sodium 137, BNP 1176.6, Magnesium 2   Vital Signs: . Weight: 134 lbs (admission weight: 135 lbs) . Blood pressure: 120-150/60s  . Heart rate: 50-60s   Medication Assistance / Insurance Benefits Check: Does the patient have prescription insurance?  Yes Type of insurance plan: [employer] Medicare  Does the patient qualify for medication assistance through manufacturers or grants?   Pending . Eligible grants and/or patient assistance programs: pending . Medication assistance applications in progress: none  . Medication assistance applications approved: none Approved medication assistance renewals will be completed by: Dr. Julien Nordmann office  Outpatient Pharmacy:  Prior to admission outpatient pharmacy: [facility] Is the patient willing to use [place] at discharge? Yes Is the patient willing to transition their outpatient pharmacy to utilize a [facility] outpatient pharmacy?   Pending    Assessment: 1. Acute on chronic systolic CHF (EF 30-94%), due to ICM. NYHA class III symptoms. SCr up from 1.35 to  1.71 today. Weight down 1 lb. 1.85L out yesterday. - Continue furosemide 40 mg IV q12h - Continue metoprolol XL 25 mg daily - Agree with switching from losartan to Entresto 24/26 mg BID. Watch SCr closely. - Consider starting spironolactone and/or Jardiance prior to discharge   Plan: 1) Medication changes recommended at this time: - Agree with starting Entresto 24/26 mg BID  2) Patient assistance application(s): - None pending - Entresto copay $45 per month - Jardiance copay $45 per month - Farxiga copay $95 per month  3)  Education  - To be completed prior to discharge  Kerby Nora, PharmD, BCPS Heart Failure Cytogeneticist Phone [phone]

## 2019-12-07 NOTE — TOC Initial Note (Signed)
Transition of Care [facility]) - Initial/Assessment Note    Patient Details  Name: Earl Mueller MRN: [id number] Date of Birth: 20-Oct-1935  Transition of Care [facility]) CM/SW Contact:    Zenon Mayo, RN Phone Number: 12/07/2019, 4:57 PM  Clinical Narrative:                 NCM spoke with patient, offered choice for HHPT, he states he just finished with [facility] and he does not want [place] services.  TOC will continue to follow for dc needs.  Expected Discharge Plan: [place] Barriers to Discharge: Continued Medical Work up   Patient Goals and CMS Choice Patient states their goals for this hospitalization and ongoing recovery are:: get better CMS Medicare.gov Compare Post Acute Care list provided to:: Patient Choice offered to / list presented to : Patient  Expected Discharge Plan and Services Expected Discharge Plan: [place]   Discharge Planning Services: CM Consult Post Acute Care Choice: [place] arrangements for the past 2 months: Single Family Home                   DME Agency: NA       HH Arranged: Refused HH          Prior Living Arrangements/Services Living arrangements for the past 2 months: Single Family Home   Patient language and need for interpreter reviewed:: Yes Do you feel safe going back to the place where you live?: Yes      Need for Family Participation in Patient Care: Yes (Comment) Care giver support system in place?: Yes (comment)   Criminal Activity/Legal Involvement Pertinent to Current Situation/Hospitalization: No - Comment as needed  Activities of Daily Living      Permission Sought/Granted                  Emotional Assessment Appearance:: Appears stated age Attitude/Demeanor/Rapport: Engaged Affect (typically observed): Appropriate Orientation: : Oriented to Self, Oriented to Place, Oriented to  Time, Oriented to Situation Alcohol / Substance Use: Not Applicable Psych  Involvement: No (comment)  Admission diagnosis:  CHF exacerbation ([place]) [I50.9] Acute respiratory failure with hypoxia (HCC) [J96.01] Acute on chronic respiratory failure with hypoxia (HCC) [J96.21] Heart failure, unspecified HF chronicity, unspecified heart failure type ([place]) [I50.9] Patient Active Problem List   Diagnosis Date Noted  . Malnutrition of moderate degree 12/07/2019  . CHF exacerbation ([place]) 12/05/2019  . Acute on chronic combined systolic and diastolic CHF (congestive heart failure) ([place]) 12/05/2019  . Acute on chronic respiratory failure with hypoxia ([place]) 12/05/2019  . Leukocytosis 12/05/2019  . Need for immunization against influenza 12/02/2019  . Medicare annual wellness visit, subsequent 12/02/2019  . Chronic respiratory failure with hypoxia ([place]) 10/29/2019  . Hypertensive heart disease without heart failure 10/29/2019  . Hemiparesis, speech and language deficits, and cognitive deficits due to recent cerebral infarction ([place]) 09/25/2019  . Cerebral embolism with cerebral infarction - multifocal, s/p tPA, source unknown  09/25/2019  . Cardiomyopathy, ischemic 09/25/2019  . Hypothyroidism 09/25/2019  . Depression 09/25/2019  . Hypokalemia 09/25/2019  . Pulmonary emphysema ([place]) 09/25/2019  . Aortic atherosclerosis ([place]) 09/25/2019  . Unstable angina ([place])   . Ischemic heart disease 09/13/2019  . Exertional dyspnea 09/13/2019  . Carotid artery stenosis 03/26/2017  . Symptomatic carotid artery stenosis 03/05/2017  . Carotid bruit 11/25/2016  . Coronary artery disease involving native coronary artery of native heart without angina pectoris 11/20/2014  . Dyslipidemia 11/20/2014  .  Essential hypertension 11/20/2014  . Peripheral vascular disease ([place]) 11/20/2014   PCP:  Rochel Brome, MD Pharmacy:   [facility], [address] [place] [address] Phone: [phone] Fax: [facility] [place], [place] [address] Phone: [phone] Fax: [phone]  [facility] [place] [place] - [address] [place] [address] Phone: [phone] Fax: [phone]     Social Determinants of Health (SDOH) Interventions    Readmission Risk Interventions Readmission Risk Prevention Plan 12/07/2019 09/25/2019  Transportation Screening Complete Complete  PCP or Specialist Appt within 5-7 Days - Complete  PCP or Specialist Appt within 3-5 Days Complete -  Home Care Screening - Complete  Medication Review (RN CM) - Complete  HRI or Home Care Consult Complete -  Social Work Consult for Recovery Care Planning/Counseling Complete -  Palliative Care Screening Not Applicable -  Medication Review (RN Care Manager) Complete -  Some recent data might be hidden

## 2019-12-07 NOTE — Progress Notes (Signed)
PROGRESS NOTE  Earl Mueller [id number] DOB: Jun 19, 1935 DOA: 12/05/2019 PCP: Rochel Brome, MD   LOS: 2 days   Brief Narrative / Interim history: 84 year old male with history of CAD, PVD, chronic systolic CHF, prior CVAs most recent one couple of months ago with aphasia and right-sided weakness, hyperlipidemia, hypothyroidism, COPD on chronic oxygen 3 L at home came into the hospital with shortness of breath has been progressive over the last couple of months but worse in the last few days.  In the ED he was found to be hypoxic requiring up titration of his oxygen to 6 L, CT angiogram was negative for PE however it did show fluid overload.  His BNP was significantly elevated.  Subjective / 24h Interval events: Feels better, he appreciates that his breathing is improved.  Overnight still requiring 5 L  Assessment & Plan: Principal Problem Acute on chronic hypoxic respiratory failure due to acute pulmonary edema in the setting of acute on chronic combined systolic and diastolic CHF -Patient was admitted to the hospital, placed on IV Lasix, continue.  Monitor ins and outs, daily weights.  High-sensitivity troponin negative.  BNP significantly elevated at around 1100 on admission. -Still not at baseline breathing, wean off to 3 L as tolerated -Cardiology consulted, appreciate input, recommended to continue to push IV Lasix for another day.  Started on Entresto  Active Problems Prior CVAs-most recent one 2 months ago, his right-sided weakness is completely resolved, he is now ambulatory without any assistance.  Continue aspirin and Plavix.  CAD with prior stenting-continue aspirin, Plavix, no chest pain.  2D echo done on 9/22 showed an EF of 48-18%, grade 1 diastolic dysfunction.  There is hypokinesis of the left ventricular, entire inferior lateral wall, however this is unchanged from prior echoes.  Essential hypertension-continue home medications, patient was started on Entresto today,  closely monitor blood pressure  Hypothyroidism-continue Synthroid  History of depression-continue paroxetine  Hyperlipidemia-continue atorvastatin, fenofibrate  COPD, pulmonary emphysema - on chronic 3 L nasal cannula at home, no wheezing  Acute kidney injury on chronic kidney disease stage IIIa-Baseline creatinine around 1.2, continue to monitor while on Lasix.  Creatinine slowly worse with the use of Lasix, continue IV Lasix per cardiology.  Scheduled Meds: . aspirin EC  81 mg Oral Daily  . atorvastatin  80 mg Oral Daily  . clopidogrel  75 mg Oral Daily  . enoxaparin (LOVENOX) injection  40 mg Subcutaneous Q24H  . feeding supplement (ENSURE ENLIVE)  237 mL Oral BID BM  . fenofibrate  160 mg Oral Daily  . furosemide  40 mg Intravenous Q12H  . levothyroxine  112 mcg Oral Daily  . metoprolol succinate  25 mg Oral Daily  . multivitamin with minerals  1 tablet Oral Daily  . PARoxetine  20 mg Oral Daily  . sacubitril-valsartan  1 tablet Oral BID  . sodium chloride flush  3 mL Intravenous Q12H  . sodium chloride flush  3 mL Intravenous Q12H   Continuous Infusions: . sodium chloride     PRN Meds:.sodium chloride, acetaminophen **OR** acetaminophen, albuterol, HYDROcodone-acetaminophen, ondansetron **OR** ondansetron (ZOFRAN) IV, sodium chloride flush  Diet Orders (From admission, onward)    Start     Ordered   12/06/19 1123  Diet Heart Room service appropriate? Yes; Fluid consistency: Thin; Fluid restriction: 2000 mL Fluid  Diet effective now       Question Answer Comment  Room service appropriate? Yes   Fluid consistency: Thin   Fluid restriction: 2000 mL Fluid  12/06/19 1122          DVT prophylaxis: enoxaparin (LOVENOX) injection 40 mg Start: 12/05/19 2115     Code Status: Full Code  Family Communication: Wife present at bedside  Status is: Inpatient  Remains inpatient appropriate because:Inpatient level of care appropriate due to severity of  illness   Dispo: The patient is from: Home              Anticipated d/c is to: Home              Anticipated d/c date is: 1 day              Patient currently is not medically stable to d/c.  Consultants:  Cardiology  Procedures:  2D echo  Microbiology  None   Antimicrobials: None     Objective: Vitals:   12/06/19 1943 12/07/19 0031 12/07/19 0440 12/07/19 0800  BP: (!) 129/56 136/67 (!) 151/82 (!) 114/57  Pulse: (!) 56 (!) 54 (!) 50 64  Resp: 20 18 18    Temp: (!) 97.3 F (36.3 C) 98.5 F (36.9 C) 98.1 F (36.7 C)   TempSrc: Oral  Oral   SpO2: 90% 100% 96% 92%  Weight:   60.8 kg   Height:        Intake/Output Summary (Last 24 hours) at 12/07/2019 0956 Last data filed at 12/07/2019 0900 Gross per 24 hour  Intake 680 ml  Output 950 ml  Net -270 ml   Filed Weights   12/05/19 2305 12/06/19 0353 12/07/19 0440  Weight: 61.4 kg 60.9 kg 60.8 kg    Examination:  Constitutional: No distress Eyes: No scleral icterus ENMT: Moist mucous membranes Neck: normal, supple Respiratory: Bibasilar crackles heard, improved, normal respiratory effort, no wheezing Cardiovascular: Regular rate and rhythm, high-pitched 3/6 SEM, no edema Abdomen: Soft, nontender, nondistended, positive bowel sounds Musculoskeletal: no clubbing / cyanosis.  Skin: No rashes appreciated Neurologic: No focal deficits, equal strength  Data Reviewed: I have independently reviewed following labs and imaging studies   CBC: Recent Labs  Lab 12/05/19 1626 12/05/19 2054 12/06/19 0030 12/07/19 0829  WBC 12.1*  --  9.6 7.9  NEUTROABS 9.0*  --  7.0  --   HGB 15.6 15.3 16.1 16.1  HCT 48.5 45.0 47.7 49.9  MCV 102.5*  --  100.0 100.8*  PLT 316  --  321 355   Basic Metabolic Panel: Recent Labs  Lab 12/05/19 1626 12/05/19 2054 12/06/19 0030 12/07/19 0829  NA 141 140 137 137  K 4.0 4.9 4.0 3.5  CL 107  --  101 100  CO2 24  --  27 25  GLUCOSE 88  --  120* 113*  BUN 18  --  16 25*  CREATININE  1.27*  --  1.35* 1.71*  CALCIUM 9.3  --  9.5 9.7  MG  --   --  2.0  --   PHOS  --   --  3.4  --    Liver Function Tests: Recent Labs  Lab 12/06/19 0030  AST 30  ALT 18  ALKPHOS 58  BILITOT 1.3*  PROT 7.5  ALBUMIN 3.6   Coagulation Profile: No results for input(s): INR, PROTIME in the last 168 hours. HbA1C: No results for input(s): HGBA1C in the last 72 hours. CBG: No results for input(s): GLUCAP in the last 168 hours.  Recent Results (from the past 240 hour(s))  SARS Coronavirus 2 by RT PCR (hospital order, performed in [facility] hospital lab) Nasopharyngeal Nasopharyngeal  Swab     Status: None   Collection Time: 12/05/19  4:35 PM   Specimen: Nasopharyngeal Swab  Result Value Ref Range Status   SARS Coronavirus 2 NEGATIVE NEGATIVE Final    Comment: (NOTE) SARS-CoV-2 target nucleic acids are NOT DETECTED.  The SARS-CoV-2 RNA is generally detectable in upper and lower respiratory specimens during the acute phase of infection. The lowest concentration of SARS-CoV-2 viral copies this assay can detect is 250 copies / mL. A negative result does not preclude SARS-CoV-2 infection and should not be used as the sole basis for treatment or other patient management decisions.  A negative result may occur with improper specimen collection / handling, submission of specimen other than nasopharyngeal swab, presence of viral mutation(s) within the areas targeted by this assay, and inadequate number of viral copies (<250 copies / mL). A negative result must be combined with clinical observations, patient history, and epidemiological information.  Fact Sheet for Patients:   StrictlyIdeas.no  Fact Sheet for Healthcare Providers: BankingDealers.co.za  This test is not yet approved or  cleared by the [place] FDA and has been authorized for detection and/or diagnosis of SARS-CoV-2 by FDA under an Emergency Use Authorization (EUA).   This EUA will remain in effect (meaning this test can be used) for the duration of the COVID-19 declaration under Section 564(b)(1) of the Act, 21 U.S.C. section 360bbb-3(b)(1), unless the authorization is terminated or revoked sooner.  Performed at [facility], [place] [address], [place], [place] [address]      Radiology Studies: ECHOCARDIOGRAM COMPLETE  Result Date: 12/06/2019    ECHOCARDIOGRAM REPORT   Patient Name:   JAHZIR STROHMEIER Date of Exam: 12/06/2019 Medical Rec #:  [id number]      Height:       70.5 in Accession #:    [id number]     Weight:       134.2 lb Date of Birth:  April 23, 1935      BSA:          1.771 m Patient Age:    84 years       BP:           160/61 mmHg Patient Gender: M              HR:           58 bpm. Exam Location:  Inpatient Procedure: 2D Echo, Cardiac Doppler and Color Doppler Indications:    CHF-Acute Systolic 174.08 / X44.81  History:        Patient has prior history of Echocardiogram examinations, most                 recent 09/22/2019. CHF, CAD; Risk Factors:Hypertension and                 Dyslipidemia.  Sonographer:    Bernadene Person RDCS Referring Phys: [place]  1. Left ventricular ejection fraction, by estimation, is 35 to 40%. The left ventricle has moderately decreased function. The left ventricle demonstrates regional wall motion abnormalities (see scoring diagram/findings for description). Left ventricular  diastolic parameters are consistent with Grade I diastolic dysfunction (impaired relaxation). There is mild hypokinesis of the left ventricular, entire inferolateral wall.  2. Right ventricular systolic function is normal. The right ventricular size is normal. There is normal pulmonary artery systolic pressure.  3. Left atrial size was mildly dilated.  4. The mitral valve is abnormal. Trivial mitral valve regurgitation. No evidence of mitral stenosis.  5. The aortic valve is abnormal. There is mild calcification of the aortic  valve. Aortic valve regurgitation is trivial. Mild aortic valve sclerosis is present, with no evidence of aortic valve stenosis.  6. The inferior vena cava is normal in size with greater than 50% respiratory variability, suggesting right atrial pressure of 3 mmHg. Comparison(s): No significant change from prior study. Conclusion(s)/Recommendation(s): No evidence of valvular vegetations on this transthoracic echocardiogram. Would recommend a transesophageal echocardiogram to exclude infective endocarditis if clinically indicated. FINDINGS  Left Ventricle: Left ventricular ejection fraction, by estimation, is 35 to 40%. The left ventricle has moderately decreased function. The left ventricle demonstrates regional wall motion abnormalities. Mild hypokinesis of the left ventricular, entire inferolateral wall. The left ventricular internal cavity size was normal in size. There is no left ventricular hypertrophy. Left ventricular diastolic parameters are consistent with Grade I diastolic dysfunction (impaired relaxation). Right Ventricle: The right ventricular size is normal. No increase in right ventricular wall thickness. Right ventricular systolic function is normal. There is normal pulmonary artery systolic pressure. The tricuspid regurgitant velocity is 2.76 m/s, and  with an assumed right atrial pressure of 3 mmHg, the estimated right ventricular systolic pressure is 36.1 mmHg. Left Atrium: Left atrial size was mildly dilated. Right Atrium: Right atrial size was normal in size. Pericardium: There is no evidence of pericardial effusion. Mitral Valve: The mitral valve is abnormal. There is mild thickening of the mitral valve leaflet(s). There is mild calcification of the mitral valve leaflet(s). Mild mitral annular calcification. Trivial mitral valve regurgitation. No evidence of mitral valve stenosis. Tricuspid Valve: The tricuspid valve is normal in structure. Tricuspid valve regurgitation is trivial. Aortic Valve:  The aortic valve is abnormal. There is mild calcification of the aortic valve. Aortic valve regurgitation is trivial. Mild aortic valve sclerosis is present, with no evidence of aortic valve stenosis. Pulmonic Valve: The pulmonic valve was not well visualized. Pulmonic valve regurgitation is not visualized. Aorta: The aortic root and ascending aorta are structurally normal, with no evidence of dilitation. Venous: The inferior vena cava is normal in size with greater than 50% respiratory variability, suggesting right atrial pressure of 3 mmHg. IAS/Shunts: The atrial septum is grossly normal.  LEFT VENTRICLE PLAX 2D LVIDd:         5.10 cm     Diastology LVIDs:         4.20 cm     LV e' medial:    3.69 cm/s LV PW:         0.80 cm     LV E/e' medial:  16.2 LV IVS:        0.90 cm     LV e' lateral:   4.81 cm/s LVOT diam:     2.00 cm     LV E/e' lateral: 12.4 LV SV:         96 LV SV Index:   54 LVOT Area:     3.14 cm  LV Volumes (MOD) LV vol d, MOD A2C: 56.0 ml LV vol d, MOD A4C: 88.6 ml LV vol s, MOD A2C: 31.2 ml LV vol s, MOD A4C: 43.6 ml LV SV MOD A2C:     24.8 ml LV SV MOD A4C:     88.6 ml LV SV MOD BP:      32.7 ml RIGHT VENTRICLE RV S prime:     10.10 cm/s TAPSE (M-mode): 2.0 cm LEFT ATRIUM           Index  RIGHT ATRIUM           Index LA diam:      3.70 cm 2.09 cm/m  RA Area:     11.30 cm LA Vol (A2C): 45.5 ml 25.69 ml/m RA Volume:   22.60 ml  12.76 ml/m LA Vol (A4C): 44.0 ml 24.84 ml/m  AORTIC VALVE LVOT Vmax:   130.00 cm/s LVOT Vmean:  92.600 cm/s LVOT VTI:    0.304 m  AORTA Ao Root diam: 3.10 cm Ao Asc diam:  3.10 cm MITRAL VALVE               TRICUSPID VALVE MV Area (PHT): 2.00 cm    TR Peak grad:   30.5 mmHg MV Decel Time: 380 msec    TR Vmax:        276.00 cm/s MV E velocity: 59.70 cm/s MV A velocity: 96.50 cm/s  SHUNTS MV E/A ratio:  0.62        Systemic VTI:  0.30 m                            Systemic Diam: 2.00 cm Buford Dresser MD Electronically signed by Buford Dresser MD  Signature Date/Time: 12/06/2019/1:21:52 PM    Final    Marzetta Board, MD, PhD [facility]  Between 7 am - 7 pm I am available, please contact me via Amion or Securechat  Between 7 pm - 7 am I am not available, please contact night coverage MD/APP via Amion

## 2019-12-07 NOTE — Progress Notes (Signed)
Progress Note  Patient Name: Earl Mueller Date of Encounter: 12/07/2019  Primary Cardiologist:   Jenean Lindau, MD   Subjective   Still requiring 5 liters but down from six.  He feels better and wants to go home.  However, he desaturated on 4 liters last night.    Inpatient Medications    Scheduled Meds: . aspirin EC  81 mg Oral Daily  . atorvastatin  80 mg Oral Daily  . clopidogrel  75 mg Oral Daily  . enoxaparin (LOVENOX) injection  40 mg Subcutaneous Q24H  . feeding supplement (ENSURE ENLIVE)  237 mL Oral BID BM  . fenofibrate  160 mg Oral Daily  . furosemide  40 mg Intravenous Q12H  . levothyroxine  112 mcg Oral Daily  . metoprolol succinate  25 mg Oral Daily  . multivitamin with minerals  1 tablet Oral Daily  . PARoxetine  20 mg Oral Daily  . sodium chloride flush  3 mL Intravenous Q12H  . sodium chloride flush  3 mL Intravenous Q12H   Continuous Infusions: . sodium chloride     PRN Meds: sodium chloride, acetaminophen **OR** acetaminophen, albuterol, HYDROcodone-acetaminophen, ondansetron **OR** ondansetron (ZOFRAN) IV, sodium chloride flush   Vital Signs    Vitals:   12/06/19 1453 12/06/19 1943 12/07/19 0031 12/07/19 0440  BP:  (!) 129/56 136/67 (!) 151/82  Pulse:  (!) 56 (!) 54 (!) 50  Resp:  20 18 18   Temp:  (!) 97.3 F (36.3 C) 98.5 F (36.9 C) 98.1 F (36.7 C)  TempSrc:  Oral  Oral  SpO2: 90% 90% 100% 96%  Weight:    60.8 kg  Height:        Intake/Output Summary (Last 24 hours) at 12/07/2019 0800 Last data filed at 12/06/2019 2100 Gross per 24 hour  Intake 480 ml  Output 775 ml  Net -295 ml   Filed Weights   12/05/19 2305 12/06/19 0353 12/07/19 0440  Weight: 61.4 kg 60.9 kg 60.8 kg    Telemetry    NSR - Personally Reviewed  ECG    NA - Personally Reviewed  Physical Exam   GEN: No acute distress.   Neck: No  JVD Cardiac: RRR, no murmurs, rubs, or gallops.  Respiratory:    Bilateral basilar crackles 1/3 of the way up.  GI:  Soft, nontender, non-distended  MS: No edema; No deformity. Neuro:  Nonfocal  Psych: Normal affect   Labs    Chemistry Recent Labs  Lab 12/05/19 1626 12/05/19 2054 12/06/19 0030  NA 141 140 137  K 4.0 4.9 4.0  CL 107  --  101  CO2 24  --  27  GLUCOSE 88  --  120*  BUN 18  --  16  CREATININE 1.27*  --  1.35*  CALCIUM 9.3  --  9.5  PROT  --   --  7.5  ALBUMIN  --   --  3.6  AST  --   --  30  ALT  --   --  18  ALKPHOS  --   --  58  BILITOT  --   --  1.3*  GFRNONAA 52*  --  49*  GFRAA >60  --  56*  ANIONGAP 10  --  9     Hematology Recent Labs  Lab 12/05/19 1626 12/05/19 2054 12/06/19 0030  WBC 12.1*  --  9.6  RBC 4.73  --  4.77  HGB 15.6 15.3 16.1  HCT 48.5 45.0 47.7  MCV 102.5*  --  100.0  MCH 33.0  --  33.8  MCHC 32.2  --  33.8  RDW 16.6*  --  16.7*  PLT 316  --  321    Cardiac EnzymesNo results for input(s): TROPONINI in the last 168 hours. No results for input(s): TROPIPOC in the last 168 hours.   BNP Recent Labs  Lab 12/05/19 1626  BNP 1,176.6*     DDimer No results for input(s): DDIMER in the last 168 hours.   Radiology    CT Angio Chest PE W and/or Wo Contrast  Result Date: 12/05/2019 CLINICAL DATA:  Hypoxia. EXAM: CT ANGIOGRAPHY CHEST WITH CONTRAST TECHNIQUE: Multidetector CT imaging of the chest was performed using the standard protocol during bolus administration of intravenous contrast. Multiplanar CT image reconstructions and MIPs were obtained to evaluate the vascular anatomy. CONTRAST:  29mL OMNIPAQUE IOHEXOL 350 MG/ML SOLN COMPARISON:  September 13, 2019. FINDINGS: Cardiovascular: Satisfactory opacification of the pulmonary arteries to the segmental level. No evidence of pulmonary embolism. Normal heart size. No pericardial effusion. Coronary artery calcifications are noted. Atherosclerosis of thoracic aorta is noted. Mediastinum/Nodes: Thyroid gland is unremarkable. Esophagus is unremarkable. Stable enlarged mediastinal adenopathy is noted most  likely is reactive in etiology. Lungs/Pleura: Moderate bilateral pleural effusions are noted with adjacent subsegmental atelectasis. No pneumothorax is noted. Emphysematous disease is noted bilaterally. Increased interstitial densities are noted throughout both lungs concerning for possible pulmonary edema. Upper Abdomen: No acute abnormality. Musculoskeletal: No chest wall abnormality. No acute or significant osseous findings. Review of the MIP images confirms the above findings. IMPRESSION: 1. No definite evidence of pulmonary embolus. 2. Coronary artery calcifications are noted suggesting coronary artery disease. 3. Moderate bilateral pleural effusions are noted with adjacent subsegmental atelectasis. 4. Increased interstitial densities are noted throughout both lungs concerning for possible pulmonary edema. 5. Stable enlarged mediastinal adenopathy is noted most likely reactive in etiology. 6. Emphysema and aortic atherosclerosis. Aortic Atherosclerosis (ICD10-I70.0) and Emphysema (ICD10-J43.9). Electronically Signed   By: Marijo Conception M.D.   On: 12/05/2019 19:16   DG Chest Portable 1 View  Result Date: 12/05/2019 CLINICAL DATA:  Dyspnea on exertion. EXAM: PORTABLE CHEST 1 VIEW COMPARISON:  September 16, 2019. FINDINGS: The heart size and mediastinal contours are within normal limits. No pneumothorax or pleural effusion is noted. Increased reticular densities are noted throughout both lungs concerning for worsening edema or atypical inflammation superimposed upon chronic interstitial lung disease. The visualized skeletal structures are unremarkable. IMPRESSION: Increased reticular densities are noted throughout both lungs concerning for worsening edema or atypical inflammation superimposed upon chronic interstitial lung disease. Aortic Atherosclerosis (ICD10-I70.0). Electronically Signed   By: Marijo Conception M.D.   On: 12/05/2019 16:48   ECHOCARDIOGRAM COMPLETE  Result Date: 12/06/2019    ECHOCARDIOGRAM  REPORT   Patient Name:   Earl Mueller Date of Exam: 12/06/2019 Medical Rec #:  [id number]      Height:       70.5 in Accession #:    [id number]     Weight:       134.2 lb Date of Birth:  [birth year]      BSA:          1.771 m Patient Age:    26 years       BP:           160/61 mmHg Patient Gender: M              HR:  58 bpm. Exam Location:  Inpatient Procedure: 2D Echo, Cardiac Doppler and Color Doppler Indications:    CHF-Acute Systolic 381.82 / X93.71  History:        Patient has prior history of Echocardiogram examinations, most                 recent 09/22/2019. CHF, CAD; Risk Factors:Hypertension and                 Dyslipidemia.  Sonographer:    Bernadene Person RDCS Referring Phys: [place]  1. Left ventricular ejection fraction, by estimation, is 35 to 40%. The left ventricle has moderately decreased function. The left ventricle demonstrates regional wall motion abnormalities (see scoring diagram/findings for description). Left ventricular  diastolic parameters are consistent with Grade I diastolic dysfunction (impaired relaxation). There is mild hypokinesis of the left ventricular, entire inferolateral wall.  2. Right ventricular systolic function is normal. The right ventricular size is normal. There is normal pulmonary artery systolic pressure.  3. Left atrial size was mildly dilated.  4. The mitral valve is abnormal. Trivial mitral valve regurgitation. No evidence of mitral stenosis.  5. The aortic valve is abnormal. There is mild calcification of the aortic valve. Aortic valve regurgitation is trivial. Mild aortic valve sclerosis is present, with no evidence of aortic valve stenosis.  6. The inferior vena cava is normal in size with greater than 50% respiratory variability, suggesting right atrial pressure of 3 mmHg. Comparison(s): No significant change from prior study. Conclusion(s)/Recommendation(s): No evidence of valvular vegetations on this transthoracic  echocardiogram. Would recommend a transesophageal echocardiogram to exclude infective endocarditis if clinically indicated. FINDINGS  Left Ventricle: Left ventricular ejection fraction, by estimation, is 35 to 40%. The left ventricle has moderately decreased function. The left ventricle demonstrates regional wall motion abnormalities. Mild hypokinesis of the left ventricular, entire inferolateral wall. The left ventricular internal cavity size was normal in size. There is no left ventricular hypertrophy. Left ventricular diastolic parameters are consistent with Grade I diastolic dysfunction (impaired relaxation). Right Ventricle: The right ventricular size is normal. No increase in right ventricular wall thickness. Right ventricular systolic function is normal. There is normal pulmonary artery systolic pressure. The tricuspid regurgitant velocity is 2.76 m/s, and  with an assumed right atrial pressure of 3 mmHg, the estimated right ventricular systolic pressure is 69.6 mmHg. Left Atrium: Left atrial size was mildly dilated. Right Atrium: Right atrial size was normal in size. Pericardium: There is no evidence of pericardial effusion. Mitral Valve: The mitral valve is abnormal. There is mild thickening of the mitral valve leaflet(s). There is mild calcification of the mitral valve leaflet(s). Mild mitral annular calcification. Trivial mitral valve regurgitation. No evidence of mitral valve stenosis. Tricuspid Valve: The tricuspid valve is normal in structure. Tricuspid valve regurgitation is trivial. Aortic Valve: The aortic valve is abnormal. There is mild calcification of the aortic valve. Aortic valve regurgitation is trivial. Mild aortic valve sclerosis is present, with no evidence of aortic valve stenosis. Pulmonic Valve: The pulmonic valve was not well visualized. Pulmonic valve regurgitation is not visualized. Aorta: The aortic root and ascending aorta are structurally normal, with no evidence of dilitation.  Venous: The inferior vena cava is normal in size with greater than 50% respiratory variability, suggesting right atrial pressure of 3 mmHg. IAS/Shunts: The atrial septum is grossly normal.  LEFT VENTRICLE PLAX 2D LVIDd:         5.10 cm     Diastology LVIDs:  4.20 cm     LV e' medial:    3.69 cm/s LV PW:         0.80 cm     LV E/e' medial:  16.2 LV IVS:        0.90 cm     LV e' lateral:   4.81 cm/s LVOT diam:     2.00 cm     LV E/e' lateral: 12.4 LV SV:         96 LV SV Index:   54 LVOT Area:     3.14 cm  LV Volumes (MOD) LV vol d, MOD A2C: 56.0 ml LV vol d, MOD A4C: 88.6 ml LV vol s, MOD A2C: 31.2 ml LV vol s, MOD A4C: 43.6 ml LV SV MOD A2C:     24.8 ml LV SV MOD A4C:     88.6 ml LV SV MOD BP:      32.7 ml RIGHT VENTRICLE RV S prime:     10.10 cm/s TAPSE (M-mode): 2.0 cm LEFT ATRIUM           Index       RIGHT ATRIUM           Index LA diam:      3.70 cm 2.09 cm/m  RA Area:     11.30 cm LA Vol (A2C): 45.5 ml 25.69 ml/m RA Volume:   22.60 ml  12.76 ml/m LA Vol (A4C): 44.0 ml 24.84 ml/m  AORTIC VALVE LVOT Vmax:   130.00 cm/s LVOT Vmean:  92.600 cm/s LVOT VTI:    0.304 m  AORTA Ao Root diam: 3.10 cm Ao Asc diam:  3.10 cm MITRAL VALVE               TRICUSPID VALVE MV Area (PHT): 2.00 cm    TR Peak grad:   30.5 mmHg MV Decel Time: 380 msec    TR Vmax:        276.00 cm/s MV E velocity: 59.70 cm/s MV A velocity: 96.50 cm/s  SHUNTS MV E/A ratio:  0.62        Systemic VTI:  0.30 m                            Systemic Diam: 2.00 cm Buford Dresser MD Electronically signed by Buford Dresser MD Signature Date/Time: 12/06/2019/1:21:52 PM    Final     Cardiac Studies   Echo:  1. Left ventricular ejection fraction, by estimation, is 35 to 40%. The  left ventricle has moderately decreased function. The left ventricle  demonstrates regional wall motion abnormalities (see scoring  diagram/findings for description). Left ventricular  diastolic parameters are consistent with Grade I diastolic  dysfunction  (impaired relaxation). There is mild hypokinesis of the left ventricular,  entire inferolateral wall.  2. Right ventricular systolic function is normal. The right ventricular  size is normal. There is normal pulmonary artery systolic pressure.  3. Left atrial size was mildly dilated.  4. The mitral valve is abnormal. Trivial mitral valve regurgitation. No  evidence of mitral stenosis.  5. The aortic valve is abnormal. There is mild calcification of the  aortic valve. Aortic valve regurgitation is trivial. Mild aortic valve  sclerosis is present, with no evidence of aortic valve stenosis.  6. The inferior vena cava is normal in size with greater than 50%  respiratory variability, suggesting right atrial pressure of 3 mmHg.   Patient Profile  84 y.o. 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 Cruzita Lederer.  Assessment & Plan    Acute on chronic hypoxic respiratory failure with acute on chronic combined CHF/ICM superimposed on underlying COPD:  Net negative 1.8 liters.  I am going to try to start [facility] (he was on low dose Cozaar).  I don't know if cost or creat will be the limiting factor in this.  I would like to give one more day of IV Lasix.  Need to watch creat closely.    Essential HTN:   BP is mildly elevated.  However, allowing some permissive borderline BPs as outlined in consult note.    Will treat in the context of treating his acute systolic HF.   CAD s/p recent PCI:  No active ischemia suspected.    History of strokes, s/p ILR 09/2019:  Follow up as an outpatient.    CKD stage III:  Creat is stable  and near baseline.    Brief NSVT (12 beats):  No arrhythmias overnight.  For questions or updates, please contact [place] Please consult [email or web address] for contact info under Cardiology/STEMI.   Signed, Minus Breeding, MD  12/07/2019, 8:00 AM

## 2019-12-07 NOTE — Consult Note (Signed)
   [facility] Inpatient Consult   12/07/2019  Earl Mueller 27-Nov-1935 [id number]   [facility]] Patient: [facility] Medicare   Patient screened for high risk score for unplanned readmission score with 3 hospitalizations in the past 6 months.  Medical record reviewed to check if potential [place] Management service needs.  Review of patient's medical record reveals patient is in a Chronic Care Management program noted with primary care.   Primary Care Provider is Rochel Brome, MD  this provider is [place] and listed to provide the transition of care [TOC] for post hospital follow up.   Plan:  Continue to follow progress and disposition to assess for post hospital care management needs.    Please place a [facility] Care Management consult as appropriate and for questions contact:   Natividad Brood, RN BSN [place] Liaison  [phone] business mobile phone Toll free office [phone]  Fax number: [phone] [email or web address]

## 2019-12-08 ENCOUNTER — Other Ambulatory Visit: Payer: Self-pay

## 2019-12-08 ENCOUNTER — Other Ambulatory Visit: Payer: Self-pay | Admitting: Student

## 2019-12-08 DIAGNOSIS — N183 Chronic kidney disease, stage 3 unspecified: Secondary | ICD-10-CM

## 2019-12-08 LAB — CBC
HCT: 50.1 % (ref 39.0–52.0)
Hemoglobin: 16.2 g/dL (ref 13.0–17.0)
MCH: 32.5 pg (ref 26.0–34.0)
MCHC: 32.3 g/dL (ref 30.0–36.0)
MCV: 100.4 fL — ABNORMAL HIGH (ref 80.0–100.0)
Platelets: 341 10*3/uL (ref 150–400)
RBC: 4.99 MIL/uL (ref 4.22–5.81)
RDW: 16.5 % — ABNORMAL HIGH (ref 11.5–15.5)
WBC: 7.9 10*3/uL (ref 4.0–10.5)
nRBC: 0 % (ref 0.0–0.2)

## 2019-12-08 LAB — BASIC METABOLIC PANEL
Anion gap: 11 (ref 5–15)
BUN: 26 mg/dL — ABNORMAL HIGH (ref 8–23)
CO2: 28 mmol/L (ref 22–32)
Calcium: 9.1 mg/dL (ref 8.9–10.3)
Chloride: 98 mmol/L (ref 98–111)
Creatinine, Ser: 1.61 mg/dL — ABNORMAL HIGH (ref 0.61–1.24)
GFR calc Af Amer: 45 mL/min — ABNORMAL LOW (ref >60)
GFR calc non Af Amer: 39 mL/min — ABNORMAL LOW (ref >60)
Glucose, Bld: 156 mg/dL — ABNORMAL HIGH (ref 70–99)
Potassium: 3.8 mmol/L (ref 3.5–5.1)
Sodium: 137 mmol/L (ref 135–145)

## 2019-12-08 MED ORDER — ENOXAPARIN SODIUM 40 MG/0.4ML ~~LOC~~ SOLN
40.0000 mg | SUBCUTANEOUS | Status: DC
  Administered 2019-12-08: 40 mg via SUBCUTANEOUS
  Filled 2019-12-08: qty 0.4

## 2019-12-08 MED ORDER — LEVOTHYROXINE SODIUM 112 MCG PO TABS
112.0000 ug | ORAL_TABLET | Freq: Every day | ORAL | 0 refills | Status: DC
Start: 2019-12-08 — End: 2020-03-24

## 2019-12-08 NOTE — Progress Notes (Signed)
Progress Note  Patient Name: Earl Mueller Date of Encounter: 12/08/2019  Primary Cardiologist:   Jenean Lindau, MD   Subjective   He says that he is breathing better.  No pain.    Inpatient Medications    Scheduled Meds: . aspirin EC  81 mg Oral Daily  . atorvastatin  80 mg Oral Daily  . clopidogrel  75 mg Oral Daily  . enoxaparin (LOVENOX) injection  30 mg Subcutaneous Q24H  . feeding supplement (ENSURE ENLIVE)  237 mL Oral BID BM  . fenofibrate  160 mg Oral Daily  . furosemide  40 mg Intravenous Q12H  . levothyroxine  112 mcg Oral Daily  . metoprolol succinate  25 mg Oral Daily  . multivitamin with minerals  1 tablet Oral Daily  . PARoxetine  20 mg Oral Daily  . sacubitril-valsartan  1 tablet Oral BID  . sodium chloride flush  3 mL Intravenous Q12H  . sodium chloride flush  3 mL Intravenous Q12H   Continuous Infusions: . sodium chloride     PRN Meds: sodium chloride, acetaminophen **OR** acetaminophen, albuterol, HYDROcodone-acetaminophen, ondansetron **OR** ondansetron (ZOFRAN) IV, sodium chloride flush   Vital Signs    Vitals:   12/07/19 1649 12/07/19 2015 12/08/19 0015 12/08/19 0434  BP: 124/63 (!) 118/54 (!) 115/51 (!) 117/53  Pulse: 60 (!) 54 (!) 53 (!) 50  Resp:  20 15 18   Temp:  97.8 F (36.6 C) 97.6 F (36.4 C) (!) 97.5 F (36.4 C)  TempSrc:   Oral Oral  SpO2:  95% 93% 94%  Weight:    60.3 kg  Height:        Intake/Output Summary (Last 24 hours) at 12/08/2019 0814 Last data filed at 12/08/2019 0516 Gross per 24 hour  Intake 900 ml  Output 1100 ml  Net -200 ml   Filed Weights   12/06/19 0353 12/07/19 0440 12/08/19 0434  Weight: 60.9 kg 60.8 kg 60.3 kg    Telemetry    NSR, rare ectopy - Personally Reviewed  ECG    NA - Personally Reviewed  Physical Exam   GEN: No  acute distress.   Neck: No  JVD Cardiac: RRR, no murmurs, rubs, or gallops.  Respiratory:    Bilateral fine crackles GI: Soft, nontender, non-distended, normal  bowel sounds  MS:  No edema; No deformity. Neuro:   Nonfocal  Psych: Oriented and appropriate    Labs    Chemistry Recent Labs  Lab 12/05/19 1626 12/05/19 1626 12/05/19 2054 12/06/19 0030 12/07/19 0829  NA 141   < > 140 137 137  K 4.0   < > 4.9 4.0 3.5  CL 107  --   --  101 100  CO2 24  --   --  27 25  GLUCOSE 88  --   --  120* 113*  BUN 18  --   --  16 25*  CREATININE 1.27*  --   --  1.35* 1.71*  CALCIUM 9.3  --   --  9.5 9.7  PROT  --   --   --  7.5  --   ALBUMIN  --   --   --  3.6  --   AST  --   --   --  30  --   ALT  --   --   --  18  --   ALKPHOS  --   --   --  58  --   BILITOT  --   --   --  1.3*  --   GFRNONAA 52*  --   --  49* 36*  GFRAA >60  --   --  56* 42*  ANIONGAP 10  --   --  9 12   < > = values in this interval not displayed.     Hematology Recent Labs  Lab 12/05/19 1626 12/05/19 1626 12/05/19 2054 12/06/19 0030 12/07/19 0829  WBC 12.1*  --   --  9.6 7.9  RBC 4.73  --   --  4.77 4.95  HGB 15.6   < > 15.3 16.1 16.1  HCT 48.5   < > 45.0 47.7 49.9  MCV 102.5*  --   --  100.0 100.8*  MCH 33.0  --   --  33.8 32.5  MCHC 32.2  --   --  33.8 32.3  RDW 16.6*  --   --  16.7* 16.7*  PLT 316  --   --  321 327   < > = values in this interval not displayed.    Cardiac EnzymesNo results for input(s): TROPONINI in the last 168 hours. No results for input(s): TROPIPOC in the last 168 hours.   BNP Recent Labs  Lab 12/05/19 1626  BNP 1,176.6*     DDimer No results for input(s): DDIMER in the last 168 hours.   Radiology    ECHOCARDIOGRAM COMPLETE  Result Date: 12/06/2019    ECHOCARDIOGRAM REPORT   Patient Name:   HERMAN FIERO Date of Exam: 12/06/2019 Medical Rec #:  [id number]      Height:       70.5 in Accession #:    [id number]     Weight:       134.2 lb Date of Birth:  13-Oct-1935      BSA:          1.771 m Patient Age:    31 years       BP:           160/61 mmHg Patient Gender: M              HR:           58 bpm. Exam Location:  Inpatient  Procedure: 2D Echo, Cardiac Doppler and Color Doppler Indications:    CHF-Acute Systolic 947.65 / Y65.03  History:        Patient has prior history of Echocardiogram examinations, most                 recent 09/22/2019. CHF, CAD; Risk Factors:Hypertension and                 Dyslipidemia.  Sonographer:    Bernadene Person RDCS Referring Phys: [place]  1. Left ventricular ejection fraction, by estimation, is 35 to 40%. The left ventricle has moderately decreased function. The left ventricle demonstrates regional wall motion abnormalities (see scoring diagram/findings for description). Left ventricular  diastolic parameters are consistent with Grade I diastolic dysfunction (impaired relaxation). There is mild hypokinesis of the left ventricular, entire inferolateral wall.  2. Right ventricular systolic function is normal. The right ventricular size is normal. There is normal pulmonary artery systolic pressure.  3. Left atrial size was mildly dilated.  4. The mitral valve is abnormal. Trivial mitral valve regurgitation. No evidence of mitral stenosis.  5. The aortic valve is abnormal. There is mild calcification of the aortic valve. Aortic valve regurgitation is trivial. Mild aortic valve sclerosis is present, with no evidence of aortic valve stenosis.  6. The inferior vena cava is normal in size with greater than 50% respiratory variability, suggesting right atrial pressure of 3 mmHg. Comparison(s): No significant change from prior study. Conclusion(s)/Recommendation(s): No evidence of valvular vegetations on this transthoracic echocardiogram. Would recommend a transesophageal echocardiogram to exclude infective endocarditis if clinically indicated. FINDINGS  Left Ventricle: Left ventricular ejection fraction, by estimation, is 35 to 40%. The left ventricle has moderately decreased function. The left ventricle demonstrates regional wall motion abnormalities. Mild hypokinesis of the left  ventricular, entire inferolateral wall. The left ventricular internal cavity size was normal in size. There is no left ventricular hypertrophy. Left ventricular diastolic parameters are consistent with Grade I diastolic dysfunction (impaired relaxation). Right Ventricle: The right ventricular size is normal. No increase in right ventricular wall thickness. Right ventricular systolic function is normal. There is normal pulmonary artery systolic pressure. The tricuspid regurgitant velocity is 2.76 m/s, and  with an assumed right atrial pressure of 3 mmHg, the estimated right ventricular systolic pressure is 96.2 mmHg. Left Atrium: Left atrial size was mildly dilated. Right Atrium: Right atrial size was normal in size. Pericardium: There is no evidence of pericardial effusion. Mitral Valve: The mitral valve is abnormal. There is mild thickening of the mitral valve leaflet(s). There is mild calcification of the mitral valve leaflet(s). Mild mitral annular calcification. Trivial mitral valve regurgitation. No evidence of mitral valve stenosis. Tricuspid Valve: The tricuspid valve is normal in structure. Tricuspid valve regurgitation is trivial. Aortic Valve: The aortic valve is abnormal. There is mild calcification of the aortic valve. Aortic valve regurgitation is trivial. Mild aortic valve sclerosis is present, with no evidence of aortic valve stenosis. Pulmonic Valve: The pulmonic valve was not well visualized. Pulmonic valve regurgitation is not visualized. Aorta: The aortic root and ascending aorta are structurally normal, with no evidence of dilitation. Venous: The inferior vena cava is normal in size with greater than 50% respiratory variability, suggesting right atrial pressure of 3 mmHg. IAS/Shunts: The atrial septum is grossly normal.  LEFT VENTRICLE PLAX 2D LVIDd:         5.10 cm     Diastology LVIDs:         4.20 cm     LV e' medial:    3.69 cm/s LV PW:         0.80 cm     LV E/e' medial:  16.2 LV IVS:         0.90 cm     LV e' lateral:   4.81 cm/s LVOT diam:     2.00 cm     LV E/e' lateral: 12.4 LV SV:         96 LV SV Index:   54 LVOT Area:     3.14 cm  LV Volumes (MOD) LV vol d, MOD A2C: 56.0 ml LV vol d, MOD A4C: 88.6 ml LV vol s, MOD A2C: 31.2 ml LV vol s, MOD A4C: 43.6 ml LV SV MOD A2C:     24.8 ml LV SV MOD A4C:     88.6 ml LV SV MOD BP:      32.7 ml RIGHT VENTRICLE RV S prime:     10.10 cm/s TAPSE (M-mode): 2.0 cm LEFT ATRIUM           Index       RIGHT ATRIUM           Index LA diam:      3.70 cm 2.09 cm/m  RA Area:  11.30 cm LA Vol (A2C): 45.5 ml 25.69 ml/m RA Volume:   22.60 ml  12.76 ml/m LA Vol (A4C): 44.0 ml 24.84 ml/m  AORTIC VALVE LVOT Vmax:   130.00 cm/s LVOT Vmean:  92.600 cm/s LVOT VTI:    0.304 m  AORTA Ao Root diam: 3.10 cm Ao Asc diam:  3.10 cm MITRAL VALVE               TRICUSPID VALVE MV Area (PHT): 2.00 cm    TR Peak grad:   30.5 mmHg MV Decel Time: 380 msec    TR Vmax:        276.00 cm/s MV E velocity: 59.70 cm/s MV A velocity: 96.50 cm/s  SHUNTS MV E/A ratio:  0.62        Systemic VTI:  0.30 m                            Systemic Diam: 2.00 cm Buford Dresser MD Electronically signed by Buford Dresser MD Signature Date/Time: 12/06/2019/1:21:52 PM    Final     Cardiac Studies   Echo:  1. Left ventricular ejection fraction, by estimation, is 35 to 40%. The  left ventricle has moderately decreased function. The left ventricle  demonstrates regional wall motion abnormalities (see scoring  diagram/findings for description). Left ventricular  diastolic parameters are consistent with Grade I diastolic dysfunction  (impaired relaxation). There is mild hypokinesis of the left ventricular,  entire inferolateral wall.  2. Right ventricular systolic function is normal. The right ventricular  size is normal. There is normal pulmonary artery systolic pressure.  3. Left atrial size was mildly dilated.  4. The mitral valve is abnormal. Trivial mitral valve  regurgitation. No  evidence of mitral stenosis.  5. The aortic valve is abnormal. There is mild calcification of the  aortic valve. Aortic valve regurgitation is trivial. Mild aortic valve  sclerosis is present, with no evidence of aortic valve stenosis.  6. The inferior vena cava is normal in size with greater than 50%  respiratory variability, suggesting right atrial pressure of 3 mmHg.   Patient Profile     84 y.o. 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 Cruzita Lederer.  Assessment & Plan    Acute on chronic hypoxic respiratory failure with acute on chronic combined CHF/ICM superimposed on underlying COPD:  Net negative 2 liters.   Creat is up so hold Lasix.  I think that we can send him home as he has been diuresed as much as possible and is down to 4 liters.  I think he has significant pulmonary disease and needs to follow with Dr. Lamonte Sakai as scheduled next week and we will follow his BMET before then.    Essential HTN:   BP is down.  Don't think he will tolerate Entresto for now.  I will hold.   CAD s/p recent PCI:  No active ischemia suspected.    History of strokes, s/p ILR 09/2019:  Follow up as an outpatient.    CKD stage III:  Creat is up with continued diuresis and adding Entresto.  Need to hold both.    Brief NSVT (12 beats):   No sustained ectopy.    For questions or updates, please contact Guy Please consult [email or web address] for contact info under Cardiology/STEMI.   Signed, Minus Breeding, MD  12/08/2019, 8:14 AM

## 2019-12-08 NOTE — Addendum Note (Signed)
Addended by: Sande Rives on: 12/08/2019 10:14 AM   Modules accepted: Orders

## 2019-12-08 NOTE — TOC Benefit Eligibility Note (Signed)
Transition of Care (TOC) Benefit Eligibility Note    Patient Details  Name: Earl Mueller MRN: [id number] Date of Birth: 11/02/1935   Medication/Dose: ENTRESTO  24-26 MG BID  Covered?: Yes  Tier: 3 Drug  Prescription Coverage Preferred Pharmacy: [facility]  Spoke with Person/Company/Phone Number:: DANY   @  [employer]  # [phone]  Co-Pay: $ 45.00  Prior Approval: No  Deductible: Met  Additional Notes: ENTRESTO  24-46 MG BID : NON-FORMULARY    Greenlee, Dora Phone Number: 12/08/2019, 9:36 AM     

## 2019-12-08 NOTE — Addendum Note (Signed)
Addended by: Sande Rives on: 12/08/2019 10:12 AM   Modules accepted: Orders

## 2019-12-08 NOTE — Progress Notes (Signed)
[facility] PROGRESS NOTE    Progress Note  Earl Mueller  VOZ:[id number] DOB: 02/20/1936 DOA: 12/05/2019 PCP: Rochel Brome, MD     Brief Narrative:   Earl Mueller is an 84 y.o. male past medical history of CAD, peripheral vascular disease chronic systolic heart failure prior CVAs most recent on last couple of months ago with aphasia right-sided weakness hyperlipidemia COPD on 3 L of oxygen comes into the hospital for progressive shortness of breath over the last several days. In the ED found to be hypoxic requiring 6 L CT angio of the chest was negative for PE however it did show pulmonary edema with an elevated BNP.  Assessment/Plan:   Acute on chronic respiratory failure due to acute flash pulmonary edema in the setting of acute on chronic combined systolic and diastolic heart failure:  Cardiology was consulted was started on IV Lasix we were able to wean him to 4 L of oxygen. Started on [place] which had to be held as there was a mild rise in his creatinine. Creatinine has been placed on hold, and will continue to diuresis an outpatient. Pulmonary also recommended for him to keep his follow-up appointment with pulmonary. And a basic metabolic panel on Monday. His oxygen had to be increased to 4 today, will have physical therapy evaluate him walking with ambulation, he does not want physical therapy at home. I will stresses respiratory status with ambulation and check his saturations.  Essential hypertension: Blood pressure is low, I do not think he will tolerate Entresto for now. Will need to follow-up with cardiology as an outpatient and resume as tolerated.  History of CVA: Most recent one 2 months ago continue aspirin and Plavix.  CAD with prior stenting: Aspirin and Plavix no chest pain 2D echo showed an EF of 35% with grade 1 diastolic heart failure. Follow-up with cardiology as an outpatient echo is unchanged from prior.  Hypothyroidism: Continue  Synthroid.  Depression: Continue paroxetine.  Hyperlipidemia: Continue Lipitor and fenofibrate.  COPD/pulmonary emphysema: Currently on 3 L of oxygen at baseline.  Acute kidney injury CKD Stage IIIa: With a baseline creatinine around 1.2 started on Lasix and creatinine worsened to 1.7 Lasix was held creatinine has plateaued.   DVT prophylaxis: lovenox Family Communication:none Status is: Inpatient  Remains inpatient appropriate because:Hemodynamically unstable   Dispo: The patient is from: Home              Anticipated d/c is to: Home              Anticipated d/c date is: 1 day              Patient currently is medically stable to d/c.        Code Status:     Code Status Orders  (From admission, onward)         Start     Ordered   12/05/19 2106  Full code  Continuous        12/05/19 2106        Code Status History    Date Active Date Inactive Code Status Order ID Comments User Context   09/21/2019 2043 09/25/2019 2028 Full Code [id number]  Aroor, Lanice Schwab, MD ED   09/13/2019 2343 09/16/2019 2100 Full Code [id number]  Rise Patience, MD ED   03/26/2017 2145 03/27/2017 1417 Full Code [id number]  Iline Oven Inpatient   02/03/2017 0912 02/03/2017 1542 Full Code [id number]  [place], Peter M, MD Inpatient   Advance  Care Planning Activity        IV Access:    Peripheral IV   Procedures and diagnostic studies:   ECHOCARDIOGRAM COMPLETE  Result Date: 12/06/2019    ECHOCARDIOGRAM REPORT   Patient Name:   Earl Mueller Date of Exam: 12/06/2019 Medical Rec #:  [id number]      Height:       70.5 in Accession #:    [id number]     Weight:       134.2 lb Date of Birth:  07-15-1935      BSA:          1.771 m Patient Age:    89 years       BP:           160/61 mmHg Patient Gender: M              HR:           58 bpm. Exam Location:  Inpatient Procedure: 2D Echo, Cardiac Doppler and Color Doppler Indications:    CHF-Acute Systolic 756.43 / P29.51  History:         Patient has prior history of Echocardiogram examinations, most                 recent 09/22/2019. CHF, CAD; Risk Factors:Hypertension and                 Dyslipidemia.  Sonographer:    Bernadene Person RDCS Referring Phys: [place]  1. Left ventricular ejection fraction, by estimation, is 35 to 40%. The left ventricle has moderately decreased function. The left ventricle demonstrates regional wall motion abnormalities (see scoring diagram/findings for description). Left ventricular  diastolic parameters are consistent with Grade I diastolic dysfunction (impaired relaxation). There is mild hypokinesis of the left ventricular, entire inferolateral wall.  2. Right ventricular systolic function is normal. The right ventricular size is normal. There is normal pulmonary artery systolic pressure.  3. Left atrial size was mildly dilated.  4. The mitral valve is abnormal. Trivial mitral valve regurgitation. No evidence of mitral stenosis.  5. The aortic valve is abnormal. There is mild calcification of the aortic valve. Aortic valve regurgitation is trivial. Mild aortic valve sclerosis is present, with no evidence of aortic valve stenosis.  6. The inferior vena cava is normal in size with greater than 50% respiratory variability, suggesting right atrial pressure of 3 mmHg. Comparison(s): No significant change from prior study. Conclusion(s)/Recommendation(s): No evidence of valvular vegetations on this transthoracic echocardiogram. Would recommend a transesophageal echocardiogram to exclude infective endocarditis if clinically indicated. FINDINGS  Left Ventricle: Left ventricular ejection fraction, by estimation, is 35 to 40%. The left ventricle has moderately decreased function. The left ventricle demonstrates regional wall motion abnormalities. Mild hypokinesis of the left ventricular, entire inferolateral wall. The left ventricular internal cavity size was normal in size. There is no left  ventricular hypertrophy. Left ventricular diastolic parameters are consistent with Grade I diastolic dysfunction (impaired relaxation). Right Ventricle: The right ventricular size is normal. No increase in right ventricular wall thickness. Right ventricular systolic function is normal. There is normal pulmonary artery systolic pressure. The tricuspid regurgitant velocity is 2.76 m/s, and  with an assumed right atrial pressure of 3 mmHg, the estimated right ventricular systolic pressure is 88.4 mmHg. Left Atrium: Left atrial size was mildly dilated. Right Atrium: Right atrial size was normal in size. Pericardium: There is no evidence of pericardial effusion. Mitral Valve: The mitral valve is abnormal. There  is mild thickening of the mitral valve leaflet(s). There is mild calcification of the mitral valve leaflet(s). Mild mitral annular calcification. Trivial mitral valve regurgitation. No evidence of mitral valve stenosis. Tricuspid Valve: The tricuspid valve is normal in structure. Tricuspid valve regurgitation is trivial. Aortic Valve: The aortic valve is abnormal. There is mild calcification of the aortic valve. Aortic valve regurgitation is trivial. Mild aortic valve sclerosis is present, with no evidence of aortic valve stenosis. Pulmonic Valve: The pulmonic valve was not well visualized. Pulmonic valve regurgitation is not visualized. Aorta: The aortic root and ascending aorta are structurally normal, with no evidence of dilitation. Venous: The inferior vena cava is normal in size with greater than 50% respiratory variability, suggesting right atrial pressure of 3 mmHg. IAS/Shunts: The atrial septum is grossly normal.  LEFT VENTRICLE PLAX 2D LVIDd:         5.10 cm     Diastology LVIDs:         4.20 cm     LV e' medial:    3.69 cm/s LV PW:         0.80 cm     LV E/e' medial:  16.2 LV IVS:        0.90 cm     LV e' lateral:   4.81 cm/s LVOT diam:     2.00 cm     LV E/e' lateral: 12.4 LV SV:         96 LV SV Index:    54 LVOT Area:     3.14 cm  LV Volumes (MOD) LV vol d, MOD A2C: 56.0 ml LV vol d, MOD A4C: 88.6 ml LV vol s, MOD A2C: 31.2 ml LV vol s, MOD A4C: 43.6 ml LV SV MOD A2C:     24.8 ml LV SV MOD A4C:     88.6 ml LV SV MOD BP:      32.7 ml RIGHT VENTRICLE RV S prime:     10.10 cm/s TAPSE (M-mode): 2.0 cm LEFT ATRIUM           Index       RIGHT ATRIUM           Index LA diam:      3.70 cm 2.09 cm/m  RA Area:     11.30 cm LA Vol (A2C): 45.5 ml 25.69 ml/m RA Volume:   22.60 ml  12.76 ml/m LA Vol (A4C): 44.0 ml 24.84 ml/m  AORTIC VALVE LVOT Vmax:   130.00 cm/s LVOT Vmean:  92.600 cm/s LVOT VTI:    0.304 m  AORTA Ao Root diam: 3.10 cm Ao Asc diam:  3.10 cm MITRAL VALVE               TRICUSPID VALVE MV Area (PHT): 2.00 cm    TR Peak grad:   30.5 mmHg MV Decel Time: 380 msec    TR Vmax:        276.00 cm/s MV E velocity: 59.70 cm/s MV A velocity: 96.50 cm/s  SHUNTS MV E/A ratio:  0.62        Systemic VTI:  0.30 m                            Systemic Diam: 2.00 cm Buford Dresser MD Electronically signed by Buford Dresser MD Signature Date/Time: 12/06/2019/1:21:52 PM    Final      Medical Consultants:    None.  Anti-Infectives:   None  Subjective:  [name] relates his breathing is significantly better than yesterday.  Objective:    Vitals:   12/08/19 0015 12/08/19 0434 12/08/19 0856 12/08/19 0938  BP: (!) 115/51 (!) 117/53 (!) 110/54 (!) 108/55  Pulse: (!) 53 (!) 50 (!) 54 (!) 51  Resp: 15 18  20   Temp: 97.6 F (36.4 C) (!) 97.5 F (36.4 C)    TempSrc: Oral Oral    SpO2: 93% 94% 94% 97%  Weight:  60.3 kg    Height:       SpO2: 97 % O2 Flow Rate (L/min): 4 L/min   Intake/Output Summary (Last 24 hours) at 12/08/2019 1020 Last data filed at 12/08/2019 0900 Gross per 24 hour  Intake 820 ml  Output 1300 ml  Net -480 ml   Filed Weights   12/06/19 0353 12/07/19 0440 12/08/19 0434  Weight: 60.9 kg 60.8 kg 60.3 kg    Exam: General exam: In no acute  distress. Respiratory system: Good air movement with diffuse crackles bilaterally. Cardiovascular system: S1 & S2 heard, RRR.  No appreciated JVD or pedal jugular reflux.. Gastrointestinal system: Abdomen is nondistended, soft and nontender.  Extremities: No pedal edema. Skin: No rashes, lesions or ulcers Psychiatry: Judgement and insight appear normal. Mood & affect appropriate.    Data Reviewed:    Labs: Basic Metabolic Panel: Recent Labs  Lab 12/05/19 1626 12/05/19 1626 12/05/19 2054 12/05/19 2054 12/06/19 0030 12/06/19 0030 12/07/19 0829 12/08/19 0821  NA 141  --  140  --  137  --  137 137  K 4.0   < > 4.9   < > 4.0   < > 3.5 3.8  CL 107  --   --   --  101  --  100 98  CO2 24  --   --   --  27  --  25 28  GLUCOSE 88  --   --   --  120*  --  113* 156*  BUN 18  --   --   --  16  --  25* 26*  CREATININE 1.27*  --   --   --  1.35*  --  1.71* 1.61*  CALCIUM 9.3  --   --   --  9.5  --  9.7 9.1  MG  --   --   --   --  2.0  --   --   --   PHOS  --   --   --   --  3.4  --   --   --    < > = values in this interval not displayed.   GFR Estimated Creatinine Clearance: 30.2 mL/min (A) (by C-G formula based on SCr of 1.61 mg/dL (H)). Liver Function Tests: Recent Labs  Lab 12/06/19 0030  AST 30  ALT 18  ALKPHOS 58  BILITOT 1.3*  PROT 7.5  ALBUMIN 3.6   No results for input(s): LIPASE, AMYLASE in the last 168 hours. No results for input(s): AMMONIA in the last 168 hours. Coagulation profile No results for input(s): INR, PROTIME in the last 168 hours. COVID-19 Labs  No results for input(s): DDIMER, FERRITIN, LDH, CRP in the last 72 hours.  Lab Results  Component Value Date   SARSCOV2NAA NEGATIVE 12/05/2019   [place] NEGATIVE 09/21/2019   [place] NEGATIVE 09/13/2019    CBC: Recent Labs  Lab 12/05/19 1626 12/05/19 2054 12/06/19 0030 12/07/19 0829 12/08/19 0821  WBC 12.1*  --  9.6 7.9 7.9  NEUTROABS 9.0*  --  7.0  --   --   HGB 15.6 15.3 16.1 16.1  16.2  HCT 48.5 45.0 47.7 49.9 50.1  MCV 102.5*  --  100.0 100.8* 100.4*  PLT 316  --  321 327 341   Cardiac Enzymes: Recent Labs  Lab 12/06/19 0030  CKTOTAL 99   BNP (last 3 results) No results for input(s): PROBNP in the last 8760 hours. CBG: No results for input(s): GLUCAP in the last 168 hours. D-Dimer: No results for input(s): DDIMER in the last 72 hours. Hgb A1c: No results for input(s): HGBA1C in the last 72 hours. Lipid Profile: No results for input(s): CHOL, HDL, LDLCALC, TRIG, CHOLHDL, LDLDIRECT in the last 72 hours. Thyroid function studies: Recent Labs    12/06/19 0030  TSH 1.728   Anemia work up: No results for input(s): VITAMINB12, FOLATE, FERRITIN, TIBC, IRON, RETICCTPCT in the last 72 hours. Sepsis Labs: Recent Labs  Lab 12/05/19 1626 12/05/19 2040 12/06/19 0030 12/07/19 0829 12/08/19 0821  PROCALCITON  --  <0.10  --   --   --   WBC 12.1*  --  9.6 7.9 7.9  LATICACIDVEN  --  1.7  --   --   --    Microbiology Recent Results (from the past 240 hour(s))  SARS Coronavirus 2 by RT PCR (hospital order, performed in [facility] hospital lab) Nasopharyngeal Nasopharyngeal Swab     Status: None   Collection Time: 12/05/19  4:35 PM   Specimen: Nasopharyngeal Swab  Result Value Ref Range Status   SARS Coronavirus 2 NEGATIVE NEGATIVE Final    Comment: (NOTE) SARS-CoV-2 target nucleic acids are NOT DETECTED.  The SARS-CoV-2 RNA is generally detectable in upper and lower respiratory specimens during the acute phase of infection. The lowest concentration of SARS-CoV-2 viral copies this assay can detect is 250 copies / mL. A negative result does not preclude SARS-CoV-2 infection and should not be used as the sole basis for treatment or other patient management decisions.  A negative result may occur with improper specimen collection / handling, submission of specimen other than nasopharyngeal swab, presence of viral mutation(s) within the areas targeted by  this assay, and inadequate number of viral copies (<250 copies / mL). A negative result must be combined with clinical observations, patient history, and epidemiological information.  Fact Sheet for Patients:   StrictlyIdeas.no  Fact Sheet for Healthcare Providers: BankingDealers.co.za  This test is not yet approved or  cleared by the [place] FDA and has been authorized for detection and/or diagnosis of SARS-CoV-2 by FDA under an Emergency Use Authorization (EUA).  This EUA will remain in effect (meaning this test can be used) for the duration of the COVID-19 declaration under Section 564(b)(1) of the Act, 21 U.S.C. section 360bbb-3(b)(1), unless the authorization is terminated or revoked sooner.  Performed at [facility], [place] [address], [place], [place] [address]      Medications:   . aspirin EC  81 mg Oral Daily  . atorvastatin  80 mg Oral Daily  . clopidogrel  75 mg Oral Daily  . enoxaparin (LOVENOX) injection  30 mg Subcutaneous Q24H  . feeding supplement (ENSURE ENLIVE)  237 mL Oral BID BM  . fenofibrate  160 mg Oral Daily  . levothyroxine  112 mcg Oral Daily  . metoprolol succinate  25 mg Oral Daily  . multivitamin with minerals  1 tablet Oral Daily  . PARoxetine  20 mg Oral Daily  . sodium  chloride flush  3 mL Intravenous Q12H  . sodium chloride flush  3 mL Intravenous Q12H   Continuous Infusions: . sodium chloride        LOS: 3 days   Charlynne Cousins  [facility]  12/08/2019, 10:20 AM

## 2019-12-08 NOTE — Progress Notes (Signed)
Pt ambulating with Physical therapy  Requiring 6L [place].

## 2019-12-08 NOTE — Progress Notes (Signed)
Physical Therapy Treatment Patient Details Name: Earl Mueller MRN: [id number] DOB: 1935-11-07 Today's Date: 12/08/2019    History of Present Illness 84 year old male with history of CAD, PVD, chronic systolic CHF, prior CVAs most recent one couple of months ago with aphasia and right-sided weakness, hyperlipidemia, hypothyroidism, COPD on chronic oxygen 3 L at home came into the hospital with shortness of breath has been progressive over the last couple of months but worse in the last few days.  In the ED he was found to be hypoxic requiring up titration of his oxygen to 6 L, CT angiogram was negative for PE however it did show fluid overload.     PT Comments    Pt supine in bed agreeable to ambulation with therapy. Pt is limited in safe mobility by increased O2 demand with ambulation and decreased endurance. Pt is supervision for bed mobility, and min guard for transfer and ambulation without AD. Pt on 4L O2 at rest and SaO2 96%O2 with ambulation, with ambulation SaO2 dropped to 84%O2, increased to 6L via [place] and able to continue ambulation with SaO2 89%O2, with sitting in recliner pt able to be weaned back to 4L O2. PT continues to recommend HHPT however pt will likely refuse. PT will continue to follow acutely.  Follow Up Recommendations  Home health PT;Supervision/Assistance - 24 hour     Equipment Recommendations  None recommended by PT       Precautions / Restrictions Precautions Precautions: Fall Restrictions Weight Bearing Restrictions: No    Mobility  Bed Mobility Overal bed mobility: Needs Assistance Bed Mobility: Supine to Sit     Supine to sit: Supervision     General bed mobility comments: supervision for safety use of bed rail to pull to EoB  Transfers Overall transfer level: Needs assistance Equipment used: None Transfers: Sit to/from Stand Sit to Stand: Min guard         General transfer comment: min guard for safety good power up and  steadying  Ambulation/Gait Ambulation/Gait assistance: Min guard Gait Distance (Feet): 100 Feet Assistive device: None Gait Pattern/deviations: Step-through pattern;Decreased stride length;Staggering left;Trunk flexed;Shuffle Gait velocity: slowed   General Gait Details: min guard for safety for slow, mildly unsteady gait, no overt LoB       Balance Overall balance assessment: Needs assistance Sitting-balance support: No upper extremity supported;Feet supported Sitting balance-Leahy Scale: Fair     Standing balance support: During functional activity;Single extremity supported;No upper extremity supported Standing balance-Leahy Scale: Fair Standing balance comment: can stand statically but cannot withstand challenges to balance.                             Cognition Arousal/Alertness: Awake/alert Behavior During Therapy: WFL for tasks assessed/performed Overall Cognitive Status: Within Functional Limits for tasks assessed                                           General Comments General comments (skin integrity, edema, etc.): Pt on 4L O2 via [place] on ehtry with SaO2 96%O2 HR 65bpm, with ambulation on 4L SaO2 dropped to 84%O2, with standing rest break and vc for purse lip breathing pt able to recover to 90%O2 briefly but dropped back to 88%O2, increased O2 to 6L with ambulation dropped to 89%O2 recovered with purse lip breathing, able to wean back to 4L in sitting  Pertinent Vitals/Pain Pain Assessment: No/denies pain           PT Goals (current goals can now be found in the care plan section) Acute Rehab PT Goals Patient Stated Goal: to go home PT Goal Formulation: With patient Time For Goal Achievement: 12/20/19 Potential to Achieve Goals: Good Progress towards PT goals: Progressing toward goals    Frequency    Min 3X/week      PT Plan Current plan remains appropriate       AM-PAC PT "6 Clicks" Mobility   Outcome  Measure  Help needed turning from your back to your side while in a flat bed without using bedrails?: None Help needed moving from lying on your back to sitting on the side of a flat bed without using bedrails?: None Help needed moving to and from a bed to a chair (including a wheelchair)?: None Help needed standing up from a chair using your arms (e.g., wheelchair or bedside chair)?: None Help needed to walk in hospital room?: None Help needed climbing 3-5 steps with a railing? : A Little 6 Click Score: 23    End of Session Equipment Utilized During Treatment: Gait belt;Oxygen Activity Tolerance: Patient limited by fatigue Patient left: in chair;with call bell/phone within reach Nurse Communication: Mobility status PT Visit Diagnosis: Unsteadiness on feet (R26.81);Muscle weakness (generalized) (M62.81)     Time: 1696-7893 PT Time Calculation (min) (ACUTE ONLY): 24 min  Charges:  $Gait Training: 23-37 mins                     Cabe Lashley B. Migdalia Dk PT, DPT Acute Rehabilitation Services Pager [phone] Office [phone]    [place] 12/08/2019, 11:28 AM

## 2019-12-08 NOTE — Progress Notes (Addendum)
Ordered BMET per Dr. Percival Spanish. Please see his rounding note from today for more details.  Darreld Mclean, PA-C 12/08/2019 10:14 AM

## 2019-12-08 NOTE — Progress Notes (Signed)
Colleague reached out to [employer] for update on pt's ILR report - per Demi, rep, no abnormal episodes on patient's loop interrogation. Continue usual f/u as planned. Lella Mullany PA-C

## 2019-12-08 NOTE — Addendum Note (Signed)
Addended by: Sande Rives on: 12/08/2019 10:21 AM   Modules accepted: Orders

## 2019-12-09 DIAGNOSIS — E440 Moderate protein-calorie malnutrition: Secondary | ICD-10-CM

## 2019-12-09 MED ORDER — LEVOTHYROXINE SODIUM 112 MCG PO TABS
112.0000 ug | ORAL_TABLET | Freq: Every day | ORAL | Status: DC
  Administered 2019-12-09: 112 ug via ORAL
  Filled 2019-12-09 (×2): qty 1

## 2019-12-09 MED ORDER — FUROSEMIDE 20 MG PO TABS: 20.0000 mg | ORAL_TABLET | Freq: Every day | ORAL | 11 refills | Status: AC

## 2019-12-09 NOTE — Plan of Care (Signed)
?  Problem: Education: ?Goal: Knowledge of General Education information will improve ?Description: Including pain rating scale, medication(s)/side effects and non-pharmacologic comfort measures ?Outcome: Adequate for Discharge ?  ?Problem: Health Behavior/Discharge Planning: ?Goal: Ability to manage health-related needs will improve ?Outcome: Adequate for Discharge ?  ?Problem: Clinical Measurements: ?Goal: Ability to maintain clinical measurements within normal limits will improve ?Outcome: Adequate for Discharge ?Goal: Will remain free from infection ?Outcome: Adequate for Discharge ?Goal: Diagnostic test results will improve ?Outcome: Adequate for Discharge ?Goal: Respiratory complications will improve ?Outcome: Adequate for Discharge ?Goal: Cardiovascular complication will be avoided ?Outcome: Adequate for Discharge ?  ?Problem: Activity: ?Goal: Risk for activity intolerance will decrease ?Outcome: Adequate for Discharge ?  ?Problem: Nutrition: ?Goal: Adequate nutrition will be maintained ?Outcome: Adequate for Discharge ?  ?Problem: Coping: ?Goal: Level of anxiety will decrease ?Outcome: Adequate for Discharge ?  ?Problem: Elimination: ?Goal: Will not experience complications related to bowel motility ?Outcome: Adequate for Discharge ?Goal: Will not experience complications related to urinary retention ?Outcome: Adequate for Discharge ?  ?Problem: Pain Managment: ?Goal: General experience of comfort will improve ?Outcome: Adequate for Discharge ?  ?Problem: Safety: ?Goal: Ability to remain free from injury will improve ?Outcome: Adequate for Discharge ?  ?Problem: Education: ?Goal: Ability to demonstrate management of disease process will improve ?Outcome: Adequate for Discharge ?Goal: Ability to verbalize understanding of medication therapies will improve ?Outcome: Adequate for Discharge ?Goal: Individualized Educational Video(s) ?Outcome: Adequate for Discharge ?  ?Problem: Activity: ?Goal: Capacity to carry  out activities will improve ?Outcome: Adequate for Discharge ?  ?Problem: Cardiac: ?Goal: Ability to achieve and maintain adequate cardiopulmonary perfusion will improve ?Outcome: Adequate for Discharge ?  ?

## 2019-12-09 NOTE — Progress Notes (Signed)
Patient given heart failure teaching packet and education provided on fluid restrictions and daily weights. New medications explained and follow up instructions discussed. IVs removed and patient helped to dress and taken down to ride home. Oxygen tank with family member for transport home

## 2019-12-09 NOTE — Care Management (Signed)
Patient provided with [facility] card with instructions on how to activate online. He states he will have a family member help him with it if needed. Informed Copay cost is $45 without card.  Patient states he has home oxygen, and his ride will be bringing tank for transport home.

## 2019-12-09 NOTE — Discharge Summary (Signed)
Physician Discharge Summary  VAL SCHIAVO JOA:[id number] DOB: 1935-07-30 DOA: 12/05/2019  PCP: Rochel Brome, MD  Admit date: 12/05/2019 Discharge date: 12/09/2019  Admitted From: Home Disposition:  Home  Recommendations for Outpatient Follow-up:  1. Follow up with PCP in 1-2 weeks 2. Please obtain BMP/CBC in one week   Home Health:no Equipment/Devices:None   Discharge Condition:Stable CODE STATUS:Full Diet recommendation: Heart Healthy   Brief/Interim Summary: 84 y.o. male past medical history of CAD, peripheral vascular disease chronic systolic heart failure prior CVAs most recent on last couple of months ago with aphasia right-sided weakness hyperlipidemia COPD on 3 L of oxygen comes into the hospital for progressive shortness of breath over the last several days. In the ED found to be hypoxic requiring 6 L CT angio of the chest was negative for PE   Discharge Diagnoses:  Active Problems:   Coronary artery disease involving native coronary artery of native heart without angina pectoris   Dyslipidemia   Essential hypertension   Peripheral vascular disease (HCC)   Hemiparesis, speech and language deficits, and cognitive deficits due to recent cerebral infarction [facility])   Hypothyroidism   Pulmonary emphysema (HCC)   Acute on chronic combined systolic and diastolic CHF (congestive heart failure) (HCC)   Acute on chronic respiratory failure with hypoxia (HCC)   Leukocytosis   Malnutrition of moderate degree  Acute on chronic respiratory failure due to acute flash pulmonary edema in the setting of acute on chronic combined systolic and diastolic heart failure: He was started on IV Lasix, cardiology was consulted and he was started on Entresto and there was a mild rise in his creatinine Lasix and Entresto as were held. He will go home on low-dose Lasix we will get a basic metabolic panel on Monday. His oxygen needs have returned to baseline, he will need to follow-up with pulmonary  as an outpatient.  Essential hypertension: No changes made to his medication.  History of CVA: Continue aspirin and Plavix.  CAD with prior stenting: Continue aspirin and Plavix 2D echo showed an EF of 35% with grade 1 diastolic heart failure. Repeated 2D echo was unchanged from previous.  Depression: Continue fluoxetine.  Hyperlipidemia: Continue Lipitor and fenofibrate.  COPD/pulmonary emphysema: Return back to his 3 L of oxygen requirements.  Acute kidney injury on chronic kidney disease stage IIIa: Likely due to overdiuresis plus or minus Entresto his Delene Loll was discontinued he will continue home on low-dose Lasix.  Discharge Instructions  Discharge Instructions    Diet - low sodium heart healthy   Complete by: As directed    Increase activity slowly   Complete by: As directed      Allergies as of 12/09/2019      Reactions   Other Other (See Comments)   Any narcotic makes him a "wild man"  Delusions (intolerance)      Medication List    TAKE these medications   Acetaminophen 500 MG capsule Take 2 capsules (1,000 mg total) by mouth every 6 (six) hours as needed for fever or pain.   aspirin EC 81 MG tablet Take 81 mg by mouth daily.   atorvastatin 80 MG tablet Commonly known as: LIPITOR TAKE 1 TABLET EVERY DAY   Breztri Aerosphere 160-9-4.8 MCG/ACT Aero Generic drug: Budeson-Glycopyrrol-Formoterol Inhale 2 puffs into the lungs in the morning and at bedtime.   CENTRUM SILVER PO Take 1 tablet by mouth daily.   cilostazol 100 MG tablet Commonly known as: PLETAL Take 100 mg by mouth 2 (two) times  daily.   clopidogrel 75 MG tablet Commonly known as: PLAVIX TAKE 1 TABLET EVERY DAY   fenofibrate 160 MG tablet Take 160 mg by mouth daily.   levothyroxine 112 MCG tablet Commonly known as: SYNTHROID Take 1 tablet (112 mcg total) by mouth daily.   losartan 25 MG tablet Commonly known as: COZAAR Take 1 tablet (25 mg total) by mouth daily. What  changed: how much to take   metoprolol succinate 25 MG 24 hr tablet Commonly known as: TOPROL-XL Take 1 tablet (25 mg total) by mouth daily.   nitroGLYCERIN 0.4 MG SL tablet Commonly known as: NITROSTAT Place 0.4 mg under the tongue every 5 (five) minutes as needed for chest pain.   PARoxetine 20 MG tablet Commonly known as: PAXIL TAKE 1 TABLET EVERY DAY       Follow-up Information    Revankar, Reita Cliche, MD Follow up.   Specialty: Cardiology Why: Hospital follow-up scheduled for 12/19/2019 at 3:00pm. Please arrive 15 minutes early for check-in.  Contact information: [place] [address] [phone]        [facility] [place] Follow up.   Specialty: Cardiology Why: Please come by our office on Monday 12/11/2019 for a lab visit so that we can recheck your kidney function. You can come by anytime from 8am to 4:30pm (lab closed from 12pm to 1pm for lunch).  Contact information: [place] [phone] [phone]             Allergies  Allergen Reactions  . Other Other (See Comments)    Any narcotic makes him a "wild man"  Delusions (intolerance)    Consultations:  Cards   Procedures/Studies: CT Angio Chest PE W and/or Wo Contrast  Result Date: 12/05/2019 CLINICAL DATA:  Hypoxia. EXAM: CT ANGIOGRAPHY CHEST WITH CONTRAST TECHNIQUE: Multidetector CT imaging of the chest was performed using the standard protocol during bolus administration of intravenous contrast. Multiplanar CT image reconstructions and MIPs were obtained to evaluate the vascular anatomy. CONTRAST:  69mL OMNIPAQUE IOHEXOL 350 MG/ML SOLN COMPARISON:  September 13, 2019. FINDINGS: Cardiovascular: Satisfactory opacification of the pulmonary arteries to the segmental level. No evidence of pulmonary embolism. Normal heart size. No pericardial effusion. Coronary artery calcifications are noted. Atherosclerosis of thoracic aorta is noted. Mediastinum/Nodes: Thyroid  gland is unremarkable. Esophagus is unremarkable. Stable enlarged mediastinal adenopathy is noted most likely is reactive in etiology. Lungs/Pleura: Moderate bilateral pleural effusions are noted with adjacent subsegmental atelectasis. No pneumothorax is noted. Emphysematous disease is noted bilaterally. Increased interstitial densities are noted throughout both lungs concerning for possible pulmonary edema. Upper Abdomen: No acute abnormality. Musculoskeletal: No chest wall abnormality. No acute or significant osseous findings. Review of the MIP images confirms the above findings. IMPRESSION: 1. No definite evidence of pulmonary embolus. 2. Coronary artery calcifications are noted suggesting coronary artery disease. 3. Moderate bilateral pleural effusions are noted with adjacent subsegmental atelectasis. 4. Increased interstitial densities are noted throughout both lungs concerning for possible pulmonary edema. 5. Stable enlarged mediastinal adenopathy is noted most likely reactive in etiology. 6. Emphysema and aortic atherosclerosis. Aortic Atherosclerosis (ICD10-I70.0) and Emphysema (ICD10-J43.9). Electronically Signed   By: Marijo Conception M.D.   On: 12/05/2019 19:16   DG Chest Portable 1 View  Result Date: 12/05/2019 CLINICAL DATA:  Dyspnea on exertion. EXAM: PORTABLE CHEST 1 VIEW COMPARISON:  September 16, 2019. FINDINGS: The heart size and mediastinal contours are within normal limits. No pneumothorax or pleural effusion is noted. Increased  reticular densities are noted throughout both lungs concerning for worsening edema or atypical inflammation superimposed upon chronic interstitial lung disease. The visualized skeletal structures are unremarkable. IMPRESSION: Increased reticular densities are noted throughout both lungs concerning for worsening edema or atypical inflammation superimposed upon chronic interstitial lung disease. Aortic Atherosclerosis (ICD10-I70.0). Electronically Signed   By: Marijo Conception  M.D.   On: 12/05/2019 16:48   ECHOCARDIOGRAM COMPLETE  Result Date: 12/06/2019    ECHOCARDIOGRAM REPORT   Patient Name:   DARIC KOREN Date of Exam: 12/06/2019 Medical Rec #:  [id number]      Height:       70.5 in Accession #:    [id number]     Weight:       134.2 lb Date of Birth:  [birth year]      BSA:          1.771 m Patient Age:    84 years       BP:           160/61 mmHg Patient Gender: M              HR:           58 bpm. Exam Location:  Inpatient Procedure: 2D Echo, Cardiac Doppler and Color Doppler Indications:    CHF-Acute Systolic 742.59 / D63.87  History:        Patient has prior history of Echocardiogram examinations, most                 recent 09/22/2019. CHF, CAD; Risk Factors:Hypertension and                 Dyslipidemia.  Sonographer:    Bernadene Person RDCS Referring Phys: [place]  1. Left ventricular ejection fraction, by estimation, is 35 to 40%. The left ventricle has moderately decreased function. The left ventricle demonstrates regional wall motion abnormalities (see scoring diagram/findings for description). Left ventricular  diastolic parameters are consistent with Grade I diastolic dysfunction (impaired relaxation). There is mild hypokinesis of the left ventricular, entire inferolateral wall.  2. Right ventricular systolic function is normal. The right ventricular size is normal. There is normal pulmonary artery systolic pressure.  3. Left atrial size was mildly dilated.  4. The mitral valve is abnormal. Trivial mitral valve regurgitation. No evidence of mitral stenosis.  5. The aortic valve is abnormal. There is mild calcification of the aortic valve. Aortic valve regurgitation is trivial. Mild aortic valve sclerosis is present, with no evidence of aortic valve stenosis.  6. The inferior vena cava is normal in size with greater than 50% respiratory variability, suggesting right atrial pressure of 3 mmHg. Comparison(s): No significant change from prior study.  Conclusion(s)/Recommendation(s): No evidence of valvular vegetations on this transthoracic echocardiogram. Would recommend a transesophageal echocardiogram to exclude infective endocarditis if clinically indicated. FINDINGS  Left Ventricle: Left ventricular ejection fraction, by estimation, is 35 to 40%. The left ventricle has moderately decreased function. The left ventricle demonstrates regional wall motion abnormalities. Mild hypokinesis of the left ventricular, entire inferolateral wall. The left ventricular internal cavity size was normal in size. There is no left ventricular hypertrophy. Left ventricular diastolic parameters are consistent with Grade I diastolic dysfunction (impaired relaxation). Right Ventricle: The right ventricular size is normal. No increase in right ventricular wall thickness. Right ventricular systolic function is normal. There is normal pulmonary artery systolic pressure. The tricuspid regurgitant velocity is 2.76 m/s, and  with an assumed right atrial pressure of 3  mmHg, the estimated right ventricular systolic pressure is 23.7 mmHg. Left Atrium: Left atrial size was mildly dilated. Right Atrium: Right atrial size was normal in size. Pericardium: There is no evidence of pericardial effusion. Mitral Valve: The mitral valve is abnormal. There is mild thickening of the mitral valve leaflet(s). There is mild calcification of the mitral valve leaflet(s). Mild mitral annular calcification. Trivial mitral valve regurgitation. No evidence of mitral valve stenosis. Tricuspid Valve: The tricuspid valve is normal in structure. Tricuspid valve regurgitation is trivial. Aortic Valve: The aortic valve is abnormal. There is mild calcification of the aortic valve. Aortic valve regurgitation is trivial. Mild aortic valve sclerosis is present, with no evidence of aortic valve stenosis. Pulmonic Valve: The pulmonic valve was not well visualized. Pulmonic valve regurgitation is not visualized. Aorta: The  aortic root and ascending aorta are structurally normal, with no evidence of dilitation. Venous: The inferior vena cava is normal in size with greater than 50% respiratory variability, suggesting right atrial pressure of 3 mmHg. IAS/Shunts: The atrial septum is grossly normal.  LEFT VENTRICLE PLAX 2D LVIDd:         5.10 cm     Diastology LVIDs:         4.20 cm     LV e' medial:    3.69 cm/s LV PW:         0.80 cm     LV E/e' medial:  16.2 LV IVS:        0.90 cm     LV e' lateral:   4.81 cm/s LVOT diam:     2.00 cm     LV E/e' lateral: 12.4 LV SV:         96 LV SV Index:   54 LVOT Area:     3.14 cm  LV Volumes (MOD) LV vol d, MOD A2C: 56.0 ml LV vol d, MOD A4C: 88.6 ml LV vol s, MOD A2C: 31.2 ml LV vol s, MOD A4C: 43.6 ml LV SV MOD A2C:     24.8 ml LV SV MOD A4C:     88.6 ml LV SV MOD BP:      32.7 ml RIGHT VENTRICLE RV S prime:     10.10 cm/s TAPSE (M-mode): 2.0 cm LEFT ATRIUM           Index       RIGHT ATRIUM           Index LA diam:      3.70 cm 2.09 cm/m  RA Area:     11.30 cm LA Vol (A2C): 45.5 ml 25.69 ml/m RA Volume:   22.60 ml  12.76 ml/m LA Vol (A4C): 44.0 ml 24.84 ml/m  AORTIC VALVE LVOT Vmax:   130.00 cm/s LVOT Vmean:  92.600 cm/s LVOT VTI:    0.304 m  AORTA Ao Root diam: 3.10 cm Ao Asc diam:  3.10 cm MITRAL VALVE               TRICUSPID VALVE MV Area (PHT): 2.00 cm    TR Peak grad:   30.5 mmHg MV Decel Time: 380 msec    TR Vmax:        276.00 cm/s MV E velocity: 59.70 cm/s MV A velocity: 96.50 cm/s  SHUNTS MV E/A ratio:  0.62        Systemic VTI:  0.30 m  Systemic Diam: 2.00 cm Buford Dresser MD Electronically signed by Buford Dresser MD Signature Date/Time: 12/06/2019/1:21:52 PM    Final     (Echo, Carotid, EGD, Colonoscopy, ERCP)    Subjective: No complaints feels great.  Discharge Exam: Vitals:   12/09/19 0453 12/09/19 0849  BP: (!) 115/56 132/62  Pulse: (!) 58 (!) 58  Resp: 18 16  Temp: (!) 97.3 F (36.3 C) 97.8 F (36.6 C)  SpO2: 91%  93%   Vitals:   12/08/19 2000 12/09/19 0047 12/09/19 0453 12/09/19 0849  BP: 122/61 (!) 118/57 (!) 115/56 132/62  Pulse: (!) 56 (!) 59 (!) 58 (!) 58  Resp: 18 18 18 16   Temp: (!) 97.3 F (36.3 C) 98.5 F (36.9 C) (!) 97.3 F (36.3 C) 97.8 F (36.6 C)  TempSrc: Oral  Oral Oral  SpO2: 94% 97% 91% 93%  Weight:   60.6 kg   Height:        General: Pt is alert, awake, not in acute distress Cardiovascular: RRR, S1/S2 +, no rubs, no gallops Respiratory: CTA bilaterally, no wheezing, no rhonchi Abdominal: Soft, NT, ND, bowel sounds + Extremities: no edema, no cyanosis    The results of significant diagnostics from this hospitalization (including imaging, microbiology, ancillary and laboratory) are listed below for reference.     Microbiology: Recent Results (from the past 240 hour(s))  SARS Coronavirus 2 by RT PCR (hospital order, performed in [facility] hospital lab) Nasopharyngeal Nasopharyngeal Swab     Status: None   Collection Time: 12/05/19  4:35 PM   Specimen: Nasopharyngeal Swab  Result Value Ref Range Status   SARS Coronavirus 2 NEGATIVE NEGATIVE Final    Comment: (NOTE) SARS-CoV-2 target nucleic acids are NOT DETECTED.  The SARS-CoV-2 RNA is generally detectable in upper and lower respiratory specimens during the acute phase of infection. The lowest concentration of SARS-CoV-2 viral copies this assay can detect is 250 copies / mL. A negative result does not preclude SARS-CoV-2 infection and should not be used as the sole basis for treatment or other patient management decisions.  A negative result may occur with improper specimen collection / handling, submission of specimen other than nasopharyngeal swab, presence of viral mutation(s) within the areas targeted by this assay, and inadequate number of viral copies (<250 copies / mL). A negative result must be combined with clinical observations, patient history, and epidemiological information.  Fact Sheet for  Patients:   StrictlyIdeas.no  Fact Sheet for Healthcare Providers: BankingDealers.co.za  This test is not yet approved or  cleared by the [place] FDA and has been authorized for detection and/or diagnosis of SARS-CoV-2 by FDA under an Emergency Use Authorization (EUA).  This EUA will remain in effect (meaning this test can be used) for the duration of the COVID-19 declaration under Section 564(b)(1) of the Act, 21 U.S.C. section 360bbb-3(b)(1), unless the authorization is terminated or revoked sooner.  Performed at [facility], [place] [address], [place], [place] [address]      Labs: BNP (last 3 results) Recent Labs    09/13/19 1825 12/05/19 1626  BNP 235.6* 6,314.9*   Basic Metabolic Panel: Recent Labs  Lab 12/05/19 1626 12/05/19 2054 12/06/19 0030 12/07/19 0829 12/08/19 0821  NA 141 140 137 137 137  K 4.0 4.9 4.0 3.5 3.8  CL 107  --  101 100 98  CO2 24  --  27 25 28   GLUCOSE 88  --  120* 113* 156*  BUN 18  --  16 25* 26*  CREATININE 1.27*  --  1.35* 1.71* 1.61*  CALCIUM 9.3  --  9.5 9.7 9.1  MG  --   --  2.0  --   --   PHOS  --   --  3.4  --   --    Liver Function Tests: Recent Labs  Lab 12/06/19 0030  AST 30  ALT 18  ALKPHOS 58  BILITOT 1.3*  PROT 7.5  ALBUMIN 3.6   No results for input(s): LIPASE, AMYLASE in the last 168 hours. No results for input(s): AMMONIA in the last 168 hours. CBC: Recent Labs  Lab 12/05/19 1626 12/05/19 2054 12/06/19 0030 12/07/19 0829 12/08/19 0821  WBC 12.1*  --  9.6 7.9 7.9  NEUTROABS 9.0*  --  7.0  --   --   HGB 15.6 15.3 16.1 16.1 16.2  HCT 48.5 45.0 47.7 49.9 50.1  MCV 102.5*  --  100.0 100.8* 100.4*  PLT 316  --  321 327 341   Cardiac Enzymes: Recent Labs  Lab 12/06/19 0030  CKTOTAL 99   BNP: Invalid input(s): POCBNP CBG: No results for input(s): GLUCAP in the last 168 hours. D-Dimer No results for input(s): DDIMER in the last 72 hours. Hgb  A1c No results for input(s): HGBA1C in the last 72 hours. Lipid Profile No results for input(s): CHOL, HDL, LDLCALC, TRIG, CHOLHDL, LDLDIRECT in the last 72 hours. Thyroid function studies No results for input(s): TSH, T4TOTAL, T3FREE, THYROIDAB in the last 72 hours.  Invalid input(s): FREET3 Anemia work up No results for input(s): VITAMINB12, FOLATE, FERRITIN, TIBC, IRON, RETICCTPCT in the last 72 hours. Urinalysis    Component Value Date/Time   COLORURINE COLORLESS (A) 12/05/2019 2051   APPEARANCEUR CLEAR 12/05/2019 2051   LABSPEC 1.009 12/05/2019 2051   PHURINE 7.0 12/05/2019 2051   GLUCOSEU NEGATIVE 12/05/2019 2051   HGBUR SMALL (A) 12/05/2019 2051   [place] NEGATIVE 12/05/2019 2051   [place] NEGATIVE 12/05/2019 2051   PROTEINUR NEGATIVE 12/05/2019 2051   NITRITE NEGATIVE 12/05/2019 2051   LEUKOCYTESUR NEGATIVE 12/05/2019 2051   Sepsis Labs Invalid input(s): PROCALCITONIN,  WBC,  LACTICIDVEN Microbiology Recent Results (from the past 240 hour(s))  SARS Coronavirus 2 by RT PCR (hospital order, performed in [place] hospital lab) Nasopharyngeal Nasopharyngeal Swab     Status: None   Collection Time: 12/05/19  4:35 PM   Specimen: Nasopharyngeal Swab  Result Value Ref Range Status   SARS Coronavirus 2 NEGATIVE NEGATIVE Final    Comment: (NOTE) SARS-CoV-2 target nucleic acids are NOT DETECTED.  The SARS-CoV-2 RNA is generally detectable in upper and lower respiratory specimens during the acute phase of infection. The lowest concentration of SARS-CoV-2 viral copies this assay can detect is 250 copies / mL. A negative result does not preclude SARS-CoV-2 infection and should not be used as the sole basis for treatment or other patient management decisions.  A negative result may occur with improper specimen collection / handling, submission of specimen other than nasopharyngeal swab, presence of viral mutation(s) within the areas targeted by this assay, and  inadequate number of viral copies (<250 copies / mL). A negative result must be combined with clinical observations, patient history, and epidemiological information.  Fact Sheet for Patients:   StrictlyIdeas.no  Fact Sheet for Healthcare Providers: BankingDealers.co.za  This test is not yet approved or  cleared by the [place] FDA and has been authorized for detection and/or diagnosis of SARS-CoV-2 by FDA under an Emergency Use Authorization (EUA).  This EUA will remain in effect (meaning this test can be used) for the duration of the COVID-19 declaration under Section 564(b)(1) of the Act, 21 U.S.C. section 360bbb-3(b)(1), unless the authorization is terminated or revoked sooner.  Performed at [facility], [place] [address] [place], [place] [address]      Time coordinating discharge: Over 30 minutes  SIGNED:   Charlynne Cousins, MD  [facility] 12/09/2019, 8:50 AM Pager   If 7PM-7AM, please contact night-coverage [email or web address] Password [email or web address]

## 2019-12-11 LAB — CUP PACEART REMOTE DEVICE CHECK
Date Time Interrogation Session: 20210926145439
Implantable Pulse Generator Implant Date: 20210712

## 2019-12-12 NOTE — Progress Notes (Signed)
Carelink Summary Report / Loop Recorder 

## 2019-12-15 ENCOUNTER — Ambulatory Visit: Payer: Medicare HMO

## 2019-12-15 ENCOUNTER — Other Ambulatory Visit: Payer: Self-pay

## 2019-12-15 ENCOUNTER — Ambulatory Visit (INDEPENDENT_AMBULATORY_CARE_PROVIDER_SITE_OTHER): Payer: Medicare HMO

## 2019-12-15 ENCOUNTER — Ambulatory Visit (INDEPENDENT_AMBULATORY_CARE_PROVIDER_SITE_OTHER): Payer: Medicare HMO | Admitting: Emergency Medicine

## 2019-12-15 ENCOUNTER — Encounter: Payer: Self-pay | Admitting: Emergency Medicine

## 2019-12-15 VITALS — BP 132/68 | HR 60 | Temp 97.5°F | Ht 70.0 in | Wt 136.2 lb

## 2019-12-15 DIAGNOSIS — I509 Heart failure, unspecified: Secondary | ICD-10-CM

## 2019-12-15 DIAGNOSIS — R0600 Dyspnea, unspecified: Secondary | ICD-10-CM | POA: Diagnosis not present

## 2019-12-15 DIAGNOSIS — J9811 Atelectasis: Secondary | ICD-10-CM | POA: Diagnosis not present

## 2019-12-15 DIAGNOSIS — R0609 Other forms of dyspnea: Secondary | ICD-10-CM

## 2019-12-15 DIAGNOSIS — J189 Pneumonia, unspecified organism: Secondary | ICD-10-CM | POA: Diagnosis not present

## 2019-12-15 DIAGNOSIS — J439 Emphysema, unspecified: Secondary | ICD-10-CM | POA: Diagnosis not present

## 2019-12-15 NOTE — Assessment & Plan Note (Signed)
Multifactorial.  Clearly has significant influence of his systolic and diastolic heart function.  He is on 3 L/min by nasal cannula.  He has emphysema on his chest imaging, probable obstructive lung disease although this is never been quantified.  He is never been on bronchodilators until he was started on Breztri about 3 weeks ago as a therapeutic trial.  Unfortunately he has not seemed to get very much out of it.  It may be that his cardiac contribution to his dyspnea far outweighs his respiratory contribution.  I have asked him to continue his Judithann Sauger until it runs out-he has about 6 weeks total.  He will keep track of how your breathing does both on and off of it so that we can gauge any response to bronchodilator.  We will perform pulmonary function testing as next office visit to quantify his degree of obstruction.  He will have chest x-ray today so that we can assess for any residual pulmonary edema, resolution of his pulmonary infiltrates.

## 2019-12-15 NOTE — Patient Instructions (Signed)
Chest x-ray today Continue your Breztri 2 puffs twice a day until your samples run out.  Rinse and gargle after using this medicine.  Please keep track of how your breathing does on the medicine.  Also make note of whether you lose any ground when you stop the medication.  We can talk about this next time. We will perform pulmonary function testing at your next office visit Continue oxygen at 3 L/min as you have been doing Follow with Dr. Lamonte Sakai next available with full pulmonary function testing on the same day.

## 2019-12-15 NOTE — Progress Notes (Signed)
Subjective:    Patient ID: Earl Mueller, male    DOB: [birth year], 84 y.o.   MRN: [id number]  HPI 84 year old former smoker (50 pack years) with coronary disease and MI 09/2019 (DES), carotid disease, hypertension with combined systolic and diastolic CHF, chronic kidney disease, PVD, hypothyroidism.  He carries a diagnosis of COPD as well, no PFT available, started on Breztri 3 weeks ago. Had never been on BD before.  He is referred for dyspnea.  He has been admitted to the hospital 3 times since June.  He had exertional angina and systolic heart failure 07/3974 and had a LAD stent done on 09/14/2019, then was admitted in July for an acute ischemic stroke (received TPA).  Finally he was admitted again 9/21-9/25/2021 with hypoxemic respiratory failure, progressive dyspnea.  Is usually on 3 L/min, required 6 L/min at admission.  Found to have flash pulmonary edema on imaging and was aggressively diuresed with improvement.  He is feeling better since hospitalization. He isn't sure that the breztri is helping him very much. He does have daily cough, productive but hasn;t seen it. His CT chest 12/05/19 showed emphysematous changes.    Review of Systems As per HPI  Past Medical History:  Diagnosis Date   Anxiety    Arthritis    CAD (coronary artery disease)    a.  s/p DES to LAD/LCx in 09/2019.   Carotid artery stenosis    a. s/p L CEA 2019. b. 09/2019: Per neurology's note, his long term BP goal is 130-150 with high grade stenosis bilateral ICA siphon and right petrous ICA.   Chronic combined systolic and diastolic CHF (congestive heart failure) (HCC)    Chronic respiratory failure (HCC)    CKD (chronic kidney disease), stage III (HCC)    COPD (chronic obstructive pulmonary disease) (HCC)    CVA (cerebral vascular accident) ([place])    Dyslipidemia 11/20/2014   Essential hypertension    Former tobacco use    Hypothyroidism    Ischemic cardiomyopathy    Peripheral vascular disease  ([place])    a. s/p aortobifem BPG 2007.   Symptomatic carotid artery stenosis 03/05/2017     Family History  Problem Relation Age of Onset   Heart failure Father      Social History   Socioeconomic History   Marital status: Married    Spouse name: Not on file   Number of children: Not on file   Years of education: Not on file   Highest education level: Not on file  Occupational History   Not on file  Tobacco Use   Smoking status: Former Smoker    Quit date: 12/18/2007    Years since quitting: 12.0   Smokeless tobacco: Never Used  Vaping Use   Vaping Use: Never used  Substance and Sexual Activity   Alcohol use: Yes    Comment: occ   Drug use: No   Sexual activity: Not on file  Other Topics Concern   Not on file  Social History Narrative   Not on file   Social Determinants of Health   Financial Resource Strain:    Difficulty of Paying Living Expenses: Not on file  Food Insecurity: No Food Insecurity   Worried About [place] in the Last Year: Never true   [place] in the Last Year: Never true  Transportation Needs: No Transportation Needs   Lack of Transportation (Medical): No   Lack of Transportation (Non-Medical): No  Physical Activity: Inactive  Days of Exercise per Week: 0 days   Minutes of Exercise per Session: 0 min  Stress:    Feeling of Stress : Not on file  Social Connections:    Frequency of Communication with Friends and Family: Not on file   Frequency of Social Gatherings with Friends and Family: Not on file   Attends Religious Services: Not on file   Active Member of Clubs or Organizations: Not on file   Attends Archivist Meetings: Not on file   Marital Status: Not on file  Intimate Partner Violence:    Fear of Current or Ex-Partner: Not on file   Emotionally Abused: Not on file   Physically Abused: Not on file   Sexually Abused: Not on file    Worked maintenance for [employer]    Former welding No other exposures.  Was in the [employer], Corporate treasurer. Was in [place].   Allergies  Allergen Reactions   Other Other (See Comments)    Any narcotic makes him a "wild man"  Delusions (intolerance)     Outpatient Medications Prior to Visit  Medication Sig Dispense Refill   Acetaminophen 500 MG capsule Take 2 capsules (1,000 mg total) by mouth every 6 (six) hours as needed for fever or pain. 30 capsule 0   aspirin EC 81 MG tablet Take 81 mg by mouth daily.     atorvastatin (LIPITOR) 80 MG tablet TAKE 1 TABLET EVERY DAY (Patient taking differently: Take 80 mg by mouth daily. ) 90 tablet 0   Budeson-Glycopyrrol-Formoterol (BREZTRI AEROSPHERE) 160-9-4.8 MCG/ACT AERO Inhale 2 puffs into the lungs in the morning and at bedtime. 10.7 g 0   cilostazol (PLETAL) 100 MG tablet Take 100 mg by mouth 2 (two) times daily.     clopidogrel (PLAVIX) 75 MG tablet TAKE 1 TABLET EVERY DAY (Patient taking differently: Take 75 mg by mouth daily. ) 90 tablet 3   fenofibrate 160 MG tablet Take 160 mg by mouth daily.     furosemide (LASIX) 20 MG tablet Take 1 tablet (20 mg total) by mouth daily. 30 tablet 11   levothyroxine (SYNTHROID) 112 MCG tablet Take 1 tablet (112 mcg total) by mouth daily. 90 tablet 0   losartan (COZAAR) 25 MG tablet Take 1 tablet (25 mg total) by mouth daily. (Patient taking differently: Take 12.5 mg by mouth daily. ) 30 tablet 4   metoprolol succinate (TOPROL-XL) 25 MG 24 hr tablet Take 1 tablet (25 mg total) by mouth daily. 30 tablet 4   Multiple Vitamins-Minerals (CENTRUM SILVER PO) Take 1 tablet by mouth daily.     nitroGLYCERIN (NITROSTAT) 0.4 MG SL tablet Place 0.4 mg under the tongue every 5 (five) minutes as needed for chest pain.     PARoxetine (PAXIL) 20 MG tablet TAKE 1 TABLET EVERY DAY (Patient taking differently: Take 20 mg by mouth daily. ) 90 tablet 3   No facility-administered medications prior to visit.        Objective:   Physical  Exam  Vitals:   12/15/19 1605  BP: 132/68  Pulse: 60  Temp: (!) 97.5 F (36.4 C)  TempSrc: Temporal  SpO2: 98%  Weight: 136 lb 3.2 oz (61.8 kg)  Height: 5\' 10"  (1.778 m)   Gen: Pleasant, thin man, in no distress,  normal affect, on [place] O2 3L/min  ENT: No lesions,  mouth clear,  oropharynx clear, no postnasal drip  Neck: No JVD, no stridor  Lungs: No use of accessory muscles, no crackles or wheezing  on normal respiration, no wheeze on forced expiration  Cardiovascular: RRR, heart sounds normal, no murmur or gallops, no peripheral edema  Musculoskeletal: No deformities, no cyanosis or clubbing  Neuro: alert, awake, non focal  Skin: Warm, no lesions or rash      Assessment & Plan:  Exertional dyspnea Multifactorial.  Clearly has significant influence of his systolic and diastolic heart function.  He is on 3 L/min by nasal cannula.  He has emphysema on his chest imaging, probable obstructive lung disease although this is never been quantified.  He is never been on bronchodilators until he was started on Breztri about 3 weeks ago as a therapeutic trial.  Unfortunately he has not seemed to get very much out of it.  It may be that his cardiac contribution to his dyspnea far outweighs his respiratory contribution.  I have asked him to continue his Judithann Sauger until it runs out-he has about 6 weeks total.  He will keep track of how your breathing does both on and off of it so that we can gauge any response to bronchodilator.  We will perform pulmonary function testing as next office visit to quantify his degree of obstruction.  He will have chest x-ray today so that we can assess for any residual pulmonary edema, resolution of his pulmonary infiltrates.  Baltazar Apo, MD, PhD 12/15/2019, 5:01 PM [facility] [phone] or if no answer [phone]

## 2019-12-17 DIAGNOSIS — J449 Chronic obstructive pulmonary disease, unspecified: Secondary | ICD-10-CM | POA: Diagnosis not present

## 2019-12-18 ENCOUNTER — Other Ambulatory Visit: Payer: Self-pay

## 2019-12-18 DIAGNOSIS — I639 Cerebral infarction, unspecified: Secondary | ICD-10-CM | POA: Insufficient documentation

## 2019-12-18 DIAGNOSIS — Z87891 Personal history of nicotine dependence: Secondary | ICD-10-CM | POA: Insufficient documentation

## 2019-12-18 DIAGNOSIS — J449 Chronic obstructive pulmonary disease, unspecified: Secondary | ICD-10-CM | POA: Insufficient documentation

## 2019-12-18 DIAGNOSIS — I2510 Atherosclerotic heart disease of native coronary artery without angina pectoris: Secondary | ICD-10-CM | POA: Insufficient documentation

## 2019-12-18 DIAGNOSIS — J9610 Chronic respiratory failure, unspecified whether with hypoxia or hypercapnia: Secondary | ICD-10-CM | POA: Insufficient documentation

## 2019-12-18 DIAGNOSIS — N183 Chronic kidney disease, stage 3 unspecified: Secondary | ICD-10-CM

## 2019-12-18 DIAGNOSIS — F419 Anxiety disorder, unspecified: Secondary | ICD-10-CM | POA: Insufficient documentation

## 2019-12-18 DIAGNOSIS — I5042 Chronic combined systolic (congestive) and diastolic (congestive) heart failure: Secondary | ICD-10-CM | POA: Insufficient documentation

## 2019-12-18 DIAGNOSIS — M1990 Unspecified osteoarthritis, unspecified site: Secondary | ICD-10-CM | POA: Insufficient documentation

## 2019-12-18 DIAGNOSIS — I255 Ischemic cardiomyopathy: Secondary | ICD-10-CM | POA: Insufficient documentation

## 2019-12-18 NOTE — Progress Notes (Unsigned)
Bmp

## 2019-12-19 ENCOUNTER — Ambulatory Visit (INDEPENDENT_AMBULATORY_CARE_PROVIDER_SITE_OTHER): Payer: Medicare HMO | Admitting: Cardiology

## 2019-12-19 ENCOUNTER — Other Ambulatory Visit: Payer: Self-pay

## 2019-12-19 ENCOUNTER — Encounter: Payer: Self-pay | Admitting: Cardiology

## 2019-12-19 VITALS — BP 115/67 | HR 66 | Ht 70.5 in | Wt 137.6 lb

## 2019-12-19 DIAGNOSIS — J431 Panlobular emphysema: Secondary | ICD-10-CM | POA: Diagnosis not present

## 2019-12-19 DIAGNOSIS — I2510 Atherosclerotic heart disease of native coronary artery without angina pectoris: Secondary | ICD-10-CM

## 2019-12-19 DIAGNOSIS — I10 Essential (primary) hypertension: Secondary | ICD-10-CM | POA: Diagnosis not present

## 2019-12-19 DIAGNOSIS — I255 Ischemic cardiomyopathy: Secondary | ICD-10-CM | POA: Diagnosis not present

## 2019-12-19 LAB — BASIC METABOLIC PANEL
BUN/Creatinine Ratio: 21 (ref 10–24)
BUN: 29 mg/dL — ABNORMAL HIGH (ref 8–27)
CO2: 22 mmol/L (ref 20–29)
Calcium: 9.8 mg/dL (ref 8.6–10.2)
Chloride: 102 mmol/L (ref 96–106)
Creatinine, Ser: 1.4 mg/dL — ABNORMAL HIGH (ref 0.76–1.27)
GFR calc Af Amer: 54 mL/min/{1.73_m2} — ABNORMAL LOW (ref >59)
GFR calc non Af Amer: 46 mL/min/{1.73_m2} — ABNORMAL LOW (ref >59)
Glucose: 93 mg/dL (ref 65–99)
Potassium: 4.8 mmol/L (ref 3.5–5.2)
Sodium: 138 mmol/L (ref 134–144)

## 2019-12-19 NOTE — Progress Notes (Signed)
Cardiology Office Note:    Date:  12/19/2019   ID:  Earl Mueller, DOB [birth year], MRN [id number]  PCP:  Rochel Brome, MD  Cardiologist:  Jenean Lindau, MD   Referring MD: Rochel Brome, MD    ASSESSMENT:    1. Coronary artery disease involving native coronary artery of native heart without angina pectoris   2. Essential hypertension   3. Cardiomyopathy, ischemic   4. Panlobular emphysema ([place])    PLAN:    In order of problems listed above:  1. Congestive heart failure: I discussed my findings with the patient at length.  Hospital records were reviewed.  I discussed diet and low-salt diet with the patient.  We also discussed regular weight checks and his wife is very meticulous with it.  She had completed him for the visit.  His daughter was over the phone and detailed instructions were given to her. 2. Coronary artery disease: Stable at this time 3. COPD on oxygen: Importance of keeping oxygen as directed by pulmonologist stressed and he promises to do so. 4. Essential hypertension: Blood pressure is stable 5. History of stroke: Patient is managed by his primary care physician for this. 6. Patient will be seen in follow-up appointment in 1 month or earlier if the patient has any concerns    Medication Adjustments/Labs and Tests Ordered: Current medicines are reviewed at length with the patient today.  Concerns regarding medicines are outlined above.  No orders of the defined types were placed in this encounter.  No orders of the defined types were placed in this encounter.    No chief complaint on file.    History of Present Illness:    Earl Mueller is a 84 y.o. male.  Patient has past medical history of coronary artery disease, essential hypertension history of stroke and congestive heart failure.  He recently was admitted to the hospital with congestive heart failure.  He is has also COPD and is on oxygen.  He denies any chest pain orthopnea or PND.  At the time of  my evaluation, the patient is alert awake oriented and in no distress.  Past Medical History:  Diagnosis Date  . Acute on chronic combined systolic and diastolic CHF (congestive heart failure) ([place]) 12/05/2019  . Acute on chronic respiratory failure with hypoxia ([place]) 12/05/2019  . Anxiety   . Aortic atherosclerosis ([place]) 09/25/2019  . Arthritis   . CAD (coronary artery disease)    a.  s/p DES to LAD/LCx in 09/2019.  . Cardiomyopathy, ischemic 09/25/2019  . Carotid artery stenosis    a. s/p L CEA 2019. b. 09/2019: Per neurology's note, his long term BP goal is 130-150 with high grade stenosis bilateral ICA siphon and right petrous ICA.  Marland Kitchen Carotid bruit 11/25/2016  . Cerebral embolism with cerebral infarction - multifocal, s/p tPA, source unknown  09/25/2019  . CHF exacerbation ([place]) 12/05/2019  . Chronic combined systolic and diastolic CHF (congestive heart failure) ([place])   . Chronic respiratory failure ([place])   . Chronic respiratory failure with hypoxia ([place]) 10/29/2019  . CKD (chronic kidney disease), stage III ([place])   . COPD (chronic obstructive pulmonary disease) ([place])   . Coronary artery disease involving native coronary artery of native heart without angina pectoris 11/20/2014  . CVA (cerebral vascular accident) ([place])   . Depression 09/25/2019  . Dyslipidemia 11/20/2014  . Essential hypertension   . Exertional dyspnea 09/13/2019  . Former tobacco use   . Hemiparesis, speech and language deficits,  and cognitive deficits due to recent cerebral infarction ([place]) 09/25/2019  . Hypertensive heart disease without heart failure 10/29/2019  . Hypokalemia 09/25/2019  . Hypothyroidism   . Ischemic cardiomyopathy   . Ischemic heart disease 09/13/2019  . Leukocytosis 12/05/2019  . Malnutrition of moderate degree 12/07/2019  . Medicare annual wellness visit, subsequent 12/02/2019  . Need for immunization against influenza 12/02/2019  . Peripheral vascular disease ([place])    a. s/p aortobifem BPG 2007.  .  Pulmonary emphysema ([place]) 09/25/2019  . Symptomatic carotid artery stenosis 03/05/2017  . Unstable angina [facility])     Past Surgical History:  Procedure Laterality Date  . BACK SURGERY    . CARDIAC CATHETERIZATION  03/06/10   NL-LM, 40%pLAD, 30% mid/distal LAD, 98% ostial DIAG (small), 30%pCx, 50%mCx, 30%dCx, 30% OM2, 40%dRCA; Med RX rec ([facility])  . caritid endart    . CORONARY ATHERECTOMY N/A 09/14/2019   Procedure: CORONARY ATHERECTOMY;  Surgeon: Martinique, Peter M, MD;  Location: [facility];  Service: Cardiovascular;  Laterality: N/A;  . CORONARY ATHERECTOMY N/A 09/15/2019   Procedure: CORONARY ATHERECTOMY;  Surgeon: Martinique, Peter M, MD;  Location: [facility];  Service: Cardiovascular;  Laterality: N/A;  . CORONARY STENT INTERVENTION N/A 09/14/2019   Procedure: CORONARY STENT INTERVENTION;  Surgeon: Martinique, Peter M, MD;  Location: [facility];  Service: Cardiovascular;  Laterality: N/A;  . CORONARY STENT INTERVENTION N/A 09/15/2019   Procedure: CORONARY STENT INTERVENTION;  Surgeon: Martinique, Peter M, MD;  Location: [facility];  Service: Cardiovascular;  Laterality: N/A;  . ENDARTERECTOMY Left 03/26/2017   Procedure: ENDARTERECTOMY CAROTID LEFT;  Surgeon: Rosetta Posner, MD;  Location: [facility];  Service: Vascular;  Laterality: Left;  . femerao bypass    . LEFT HEART CATH AND CORONARY ANGIOGRAPHY N/A 02/03/2017   Procedure: LEFT HEART CATH AND CORONARY ANGIOGRAPHY;  Surgeon: Martinique, Peter M, MD;  Location: [facility];  Service: Cardiovascular;  Laterality: N/A;  . LEFT HEART CATH AND CORONARY ANGIOGRAPHY N/A 09/14/2019   Procedure: LEFT HEART CATH AND CORONARY ANGIOGRAPHY;  Surgeon: Martinique, Peter M, MD;  Location: [facility];  Service: Cardiovascular;  Laterality: N/A;  . LOOP RECORDER INSERTION N/A 09/25/2019   Procedure: LOOP RECORDER INSERTION;  Surgeon: Deboraha Sprang, MD;  Location: [facility];  Service: Cardiovascular;  Laterality: N/A;  . PATCH  ANGIOPLASTY Left 03/26/2017   Procedure: PATCH ANGIOPLASTY LEFT CAROTID ARTERY;  Surgeon: Rosetta Posner, MD;  Location: [facility];  Service: Vascular;  Laterality: Left;  . SCALP LACERATION REPAIR N/A 05/23/2013   Procedure: EXCISION SCALP ULCER DEBRIDEMENT OF SKIN AND BONE WITH PLACEMENT OF A-CELL;  Surgeon: Irene Limbo, MD;  Location: [facility];  Service: Plastics;  Laterality: N/A;  . STOMACH SURGERY     x2  . VASCULAR SURGERY     AFBG 10/15/05    Current Medications: Current Meds  Medication Sig  . Acetaminophen 500 MG capsule Take 2 capsules (1,000 mg total) by mouth every 6 (six) hours as needed for fever or pain.  Marland Kitchen aspirin EC 81 MG tablet Take 81 mg by mouth daily.  Marland Kitchen atorvastatin (LIPITOR) 80 MG tablet TAKE 1 TABLET EVERY DAY (Patient taking differently: Take 80 mg by mouth daily. )  . Budeson-Glycopyrrol-Formoterol (BREZTRI AEROSPHERE) 160-9-4.8 MCG/ACT AERO Inhale 2 puffs into the lungs in the morning and at bedtime.  . cilostazol (PLETAL) 100 MG tablet Take 100 mg by mouth 2 (two) times daily.  Marland Kitchen  clopidogrel (PLAVIX) 75 MG tablet TAKE 1 TABLET EVERY DAY (Patient taking differently: Take 75 mg by mouth daily. )  . fenofibrate 160 MG tablet Take 160 mg by mouth daily.  . furosemide (LASIX) 20 MG tablet Take 1 tablet (20 mg total) by mouth daily.  Marland Kitchen levothyroxine (SYNTHROID) 112 MCG tablet Take 1 tablet (112 mcg total) by mouth daily.  Marland Kitchen losartan (COZAAR) 25 MG tablet Take 1 tablet (25 mg total) by mouth daily. (Patient taking differently: Take 12.5 mg by mouth daily. )  . metoprolol succinate (TOPROL-XL) 25 MG 24 hr tablet Take 1 tablet (25 mg total) by mouth daily.  . Multiple Vitamins-Minerals (CENTRUM SILVER PO) Take 1 tablet by mouth daily.  . nitroGLYCERIN (NITROSTAT) 0.4 MG SL tablet Place 0.4 mg under the tongue every 5 (five) minutes as needed for chest pain.  Marland Kitchen PARoxetine (PAXIL) 20 MG tablet TAKE 1 TABLET EVERY DAY (Patient taking differently: Take 20 mg by mouth daily. )       Allergies:   Other   Social History   Socioeconomic History  . Marital status: Married    Spouse name: Not on file  . Number of children: Not on file  . Years of education: Not on file  . Highest education level: Not on file  Occupational History  . Not on file  Tobacco Use  . Smoking status: Former Smoker    Quit date: 12/18/2007    Years since quitting: 12.0  . Smokeless tobacco: Never Used  Vaping Use  . Vaping Use: Never used  Substance and Sexual Activity  . Alcohol use: Yes    Comment: occ  . Drug use: No  . Sexual activity: Not on file  Other Topics Concern  . Not on file  Social History Narrative  . Not on file   Social Determinants of Health   Financial Resource Strain:   . Difficulty of Paying Living Expenses: Not on file  Food Insecurity: No Food Insecurity  . Worried About [place] fundraiser in the Last Year: Never true  . Ran Out of Food in the Last Year: Never true  Transportation Needs: No Transportation Needs  . Lack of Transportation (Medical): No  . Lack of Transportation (Non-Medical): No  Physical Activity: Inactive  . Days of Exercise per Week: 0 days  . Minutes of Exercise per Session: 0 min  Stress:   . Feeling of Stress : Not on file  Social Connections:   . Frequency of Communication with Friends and Family: Not on file  . Frequency of Social Gatherings with Friends and Family: Not on file  . Attends Religious Services: Not on file  . Active Member of Clubs or Organizations: Not on file  . Attends Archivist Meetings: Not on file  . Marital Status: Not on file     Family History: The patient's family history includes Heart failure in his father.  ROS:   Please see the history of present illness.    All other systems reviewed and are negative.  EKGs/Labs/Other Studies Reviewed:    The following studies were reviewed today: IMPRESSIONS    1. Left ventricular ejection fraction, by estimation, is 35 to 40%. The   left ventricle has moderately decreased function. The left ventricle  demonstrates regional wall motion abnormalities (see scoring  diagram/findings for description). Left ventricular  diastolic parameters are consistent with Grade I diastolic dysfunction  (impaired relaxation). There is mild hypokinesis of the left ventricular,  entire inferolateral  wall.  2. Right ventricular systolic function is normal. The right ventricular  size is normal. There is normal pulmonary artery systolic pressure.  3. Left atrial size was mildly dilated.  4. The mitral valve is abnormal. Trivial mitral valve regurgitation. No  evidence of mitral stenosis.  5. The aortic valve is abnormal. There is mild calcification of the  aortic valve. Aortic valve regurgitation is trivial. Mild aortic valve  sclerosis is present, with no evidence of aortic valve stenosis.  6. The inferior vena cava is normal in size with greater than 50%  respiratory variability, suggesting right atrial pressure of 3 mmHg.   Comparison(s): No significant change from prior study.    Recent Labs: 12/05/2019: B Natriuretic Peptide 1,176.6 12/06/2019: ALT 18; Magnesium 2.0; TSH 1.728 12/08/2019: Hemoglobin 16.2; Platelets 341 12/18/2019: BUN 29; Creatinine, Ser 1.40; Potassium 4.8; Sodium 138  Recent Lipid Panel    Component Value Date/Time   CHOL 124 09/23/2019 0257   CHOL 148 07/05/2019 1003   TRIG 52 09/23/2019 0257   HDL 51 09/23/2019 0257   HDL 56 07/05/2019 1003   CHOLHDL 2.4 09/23/2019 0257   VLDL 10 09/23/2019 0257   LDLCALC 63 09/23/2019 0257   LDLCALC 79 07/05/2019 1003    Physical Exam:    VS:  BP 115/67   Pulse 66   Ht 5' 10.5" (1.791 m)   Wt 137 lb 9.6 oz (62.4 kg)   SpO2 (!) 86% Comment: 3 Liters [place]  BMI 19.46 kg/m     Wt Readings from Last 3 Encounters:  12/19/19 137 lb 9.6 oz (62.4 kg)  12/15/19 136 lb 3.2 oz (61.8 kg)  12/09/19 133 lb 11.2 oz (60.6 kg)     GEN: Patient is in no acute  distress HEENT: Normal NECK: No JVD; No carotid bruits LYMPHATICS: No lymphadenopathy CARDIAC: Hear sounds regular, 2/6 systolic murmur at the apex. RESPIRATORY:  Clear to auscultation without rales, wheezing or rhonchi  ABDOMEN: Soft, non-tender, non-distended MUSCULOSKELETAL:  No edema; No deformity  SKIN: Warm and dry NEUROLOGIC:  Alert and oriented x 3 PSYCHIATRIC:  Normal affect   Signed, Jenean Lindau, MD  12/19/2019 3:30 PM    [facility]

## 2019-12-19 NOTE — Patient Instructions (Addendum)
Medication Instructions:  No medication changes. *If you need a refill on your cardiac medications before your next appointment, please call your pharmacy*   Lab Work: None ordered If you have labs (blood work) drawn today and your tests are completely normal, you will receive your results only by: Marland Kitchen MyChart Message (if you have MyChart) OR . A paper copy in the mail If you have any lab test that is abnormal or we need to change your treatment, we will call you to review the results.   Testing/Procedures: None ordered   Follow-Up: At [facility], you and your health needs are our priority.  As part of our continuing mission to provide you with exceptional heart care, we have created designated Provider Care Teams.  These Care Teams include your primary Cardiologist (physician) and Advanced Practice Providers (APPs -  Physician Assistants and Nurse Practitioners) who all work together to provide you with the care you need, when you need it.  We recommend signing up for the patient portal called "MyChart".  Sign up information is provided on this After Visit Summary.  MyChart is used to connect with patients for Virtual Visits (Telemedicine).  Patients are able to view lab/test results, encounter notes, upcoming appointments, etc.  Non-urgent messages can be sent to your provider as well.   To learn more about what you can do with MyChart, go to [email or web address].    Your next appointment:   1 month(s)  The format for your next appointment:   In Person  Provider:   Jyl Heinz, MD   Other Instructions NA  Low-Sodium Eating Plan Sodium, which is an element that makes up salt, helps you maintain a healthy balance of fluids in your body. Too much sodium can increase your blood pressure and cause fluid and waste to be held in your body. Your health care provider or dietitian may recommend following this plan if you have high blood pressure (hypertension), kidney disease,  liver disease, or heart failure. Eating less sodium can help lower your blood pressure, reduce swelling, and protect your heart, liver, and kidneys. What are tips for following this plan? General guidelines  Most people on this plan should limit their sodium intake to 1,500-2,000 mg (milligrams) of sodium each day. Reading food labels   The Nutrition Facts label lists the amount of sodium in one serving of the food. If you eat more than one serving, you must multiply the listed amount of sodium by the number of servings.  Choose foods with less than 140 mg of sodium per serving.  Avoid foods with 300 mg of sodium or more per serving. Shopping  Look for lower-sodium products, often labeled as "low-sodium" or "no salt added."  Always check the sodium content even if foods are labeled as "unsalted" or "no salt added".  Buy fresh foods. ? Avoid canned foods and premade or frozen meals. ? Avoid canned, cured, or processed meats  Buy breads that have less than 80 mg of sodium per slice. Cooking  Eat more home-cooked food and less restaurant, buffet, and fast food.  Avoid adding salt when cooking. Use salt-free seasonings or herbs instead of table salt or sea salt. Check with your health care provider or pharmacist before using salt substitutes.  Cook with plant-based oils, such as canola, sunflower, or olive oil. Meal planning  When eating at a restaurant, ask that your food be prepared with less salt or no salt, if possible.  Avoid foods that contain MSG (monosodium glutamate).  MSG is sometimes added to [place] food, bouillon, and some canned foods. What foods are recommended? The items listed may not be a complete list. Talk with your dietitian about what dietary choices are best for you. Grains Low-sodium cereals, including oats, puffed wheat and rice, and shredded wheat. Low-sodium crackers. Unsalted rice. Unsalted pasta. Low-sodium bread. Whole-grain breads and whole-grain  pasta. Vegetables Fresh or frozen vegetables. "No salt added" canned vegetables. "No salt added" tomato sauce and paste. Low-sodium or reduced-sodium tomato and vegetable juice. Fruits Fresh, frozen, or canned fruit. Fruit juice. Meats and other protein foods Fresh or frozen (no salt added) meat, poultry, seafood, and fish. Low-sodium canned tuna and salmon. Unsalted nuts. Dried peas, beans, and lentils without added salt. Unsalted canned beans. Eggs. Unsalted nut butters. Dairy Milk. Soy milk. Cheese that is naturally low in sodium, such as ricotta cheese, fresh mozzarella, or Swiss cheese Low-sodium or reduced-sodium cheese. Cream cheese. Yogurt. Fats and oils Unsalted butter. Unsalted margarine with no trans fat. Vegetable oils such as canola or olive oils. Seasonings and other foods Fresh and dried herbs and spices. Salt-free seasonings. Low-sodium mustard and ketchup. Sodium-free salad dressing. Sodium-free light mayonnaise. Fresh or refrigerated horseradish. Lemon juice. Vinegar. Homemade, reduced-sodium, or low-sodium soups. Unsalted popcorn and pretzels. Low-salt or salt-free chips. What foods are not recommended? The items listed may not be a complete list. Talk with your dietitian about what dietary choices are best for you. Grains Instant hot cereals. Bread stuffing, pancake, and biscuit mixes. Croutons. Seasoned rice or pasta mixes. Noodle soup cups. Boxed or frozen macaroni and cheese. Regular salted crackers. Self-rising flour. Vegetables Sauerkraut, pickled vegetables, and relishes. Olives. [place] fries. Onion rings. Regular canned vegetables (not low-sodium or reduced-sodium). Regular canned tomato sauce and paste (not low-sodium or reduced-sodium). Regular tomato and vegetable juice (not low-sodium or reduced-sodium). Frozen vegetables in sauces. Meats and other protein foods Meat or fish that is salted, canned, smoked, spiced, or pickled. Bacon, ham, sausage, hotdogs, corned  beef, chipped beef, packaged lunch meats, salt pork, jerky, pickled herring, anchovies, regular canned tuna, sardines, salted nuts. Dairy Processed cheese and cheese spreads. Cheese curds. Blue cheese. Feta cheese. String cheese. Regular cottage cheese. Buttermilk. Canned milk. Fats and oils Salted butter. Regular margarine. Ghee. Bacon fat. Seasonings and other foods Onion salt, garlic salt, seasoned salt, table salt, and sea salt. Canned and packaged gravies. Worcestershire sauce. Tartar sauce. Barbecue sauce. Teriyaki sauce. Soy sauce, including reduced-sodium. Steak sauce. Fish sauce. Oyster sauce. Cocktail sauce. Horseradish that you find on the shelf. Regular ketchup and mustard. Meat flavorings and tenderizers. Bouillon cubes. Hot sauce and Tabasco sauce. Premade or packaged marinades. Premade or packaged taco seasonings. Relishes. Regular salad dressings. Salsa. Potato and tortilla chips. Corn chips and puffs. Salted popcorn and pretzels. Canned or dried soups. Pizza. Frozen entrees and pot pies. Summary  Eating less sodium can help lower your blood pressure, reduce swelling, and protect your heart, liver, and kidneys.  Most people on this plan should limit their sodium intake to 1,500-2,000 mg (milligrams) of sodium each day.  Canned, boxed, and frozen foods are high in sodium. Restaurant foods, fast foods, and pizza are also very high in sodium. You also get sodium by adding salt to food.  Try to cook at home, eat more fresh fruits and vegetables, and eat less fast food, canned, processed, or prepared foods. This information is not intended to replace advice given to you by your health care provider. Make sure you discuss any questions you  have with your health care provider. Document Revised: 02/12/2017 Document Reviewed: 02/24/2016 Elsevier Patient Education  2020 [employer].

## 2019-12-21 ENCOUNTER — Telehealth: Payer: Self-pay

## 2019-12-21 ENCOUNTER — Ambulatory Visit: Payer: Medicare HMO | Admitting: Cardiology

## 2019-12-21 ENCOUNTER — Other Ambulatory Visit: Payer: Self-pay

## 2019-12-21 NOTE — Chronic Care Management (AMB) (Signed)
Spoke to [place] concerning patient's meal benefit from [place]. Pharmacist coordinated [employer] delivery for 12/27/2019. This will be 14 meals of low-sodium diet shipped to the patient's home.   Patient's wife is working on lower sodium diet and hoping that the meal delivery will provide good options for patient.   Sherre Poot, PharmD, [facility] Clinical Pharmacist [facility] [phone] (office) [phone] (mobile)

## 2019-12-21 NOTE — Patient Outreach (Signed)
Earl Mueller) Care Management  12/21/2019  [name] [birth year] [id number]  Telephone outreach to patient to obtain mRS was successfully completed. MRS=3  [place] Management Assistant [phone]

## 2019-12-25 ENCOUNTER — Other Ambulatory Visit: Payer: Self-pay

## 2019-12-25 MED ORDER — METOPROLOL SUCCINATE ER 25 MG PO TB24
25.0000 mg | ORAL_TABLET | Freq: Every day | ORAL | 0 refills | Status: DC
Start: 2019-12-25 — End: 2020-01-23

## 2019-12-25 MED ORDER — LOSARTAN POTASSIUM 25 MG PO TABS
12.5000 mg | ORAL_TABLET | Freq: Every day | ORAL | 0 refills | Status: DC
Start: 2019-12-25 — End: 2020-01-22

## 2020-01-04 ENCOUNTER — Telehealth: Payer: Self-pay

## 2020-01-04 NOTE — Chronic Care Management (AMB) (Addendum)
Earl Mueller has requested a nutritionist consult for Earl Mueller. She is trying to help him with low sodium/heart healthy diet but feels that she could use some extra help. She has spoken with nutritionist at [facility]. The nutritionist can see him if he gets a referral from provider.  Pharmacist relayed this information to referral coordinator.  Sherre Poot, PharmD, [facility] Clinical Pharmacist [facility] [phone] (office) [phone] (mobile)

## 2020-01-08 NOTE — Chronic Care Management (AMB) (Cosign Needed)
Contacted patient's wife about Cardiopulmonary recommendation from Dr. Tobie Poet. She wants to stay in [place] if possible for any referrals at this time. If there is an option in [place], she would be willing to consider.   She is seeing nutritionist today for herself and will let pharmacist know if she wants to proceed with cardiopulmonary referral.   Sherre Poot, PharmD, [facility] Clinical Pharmacist [facility] [phone] (office) [phone] (mobile)

## 2020-01-10 ENCOUNTER — Ambulatory Visit: Payer: Medicare HMO | Admitting: Cardiology

## 2020-01-12 ENCOUNTER — Ambulatory Visit (INDEPENDENT_AMBULATORY_CARE_PROVIDER_SITE_OTHER): Payer: Medicare HMO

## 2020-01-12 DIAGNOSIS — I639 Cerebral infarction, unspecified: Secondary | ICD-10-CM | POA: Diagnosis not present

## 2020-01-13 LAB — CUP PACEART REMOTE DEVICE CHECK
Date Time Interrogation Session: 20211029145645
Implantable Pulse Generator Implant Date: 20210712

## 2020-01-15 ENCOUNTER — Encounter: Payer: Self-pay | Admitting: Emergency Medicine

## 2020-01-15 ENCOUNTER — Ambulatory Visit (INDEPENDENT_AMBULATORY_CARE_PROVIDER_SITE_OTHER): Payer: Medicare HMO | Admitting: Emergency Medicine

## 2020-01-15 ENCOUNTER — Ambulatory Visit: Payer: Medicare HMO | Admitting: Emergency Medicine

## 2020-01-15 ENCOUNTER — Other Ambulatory Visit: Payer: Self-pay

## 2020-01-15 DIAGNOSIS — J9611 Chronic respiratory failure with hypoxia: Secondary | ICD-10-CM

## 2020-01-15 DIAGNOSIS — I5042 Chronic combined systolic (congestive) and diastolic (congestive) heart failure: Secondary | ICD-10-CM | POA: Diagnosis not present

## 2020-01-15 DIAGNOSIS — I509 Heart failure, unspecified: Secondary | ICD-10-CM

## 2020-01-15 LAB — PULMONARY FUNCTION TEST
DL/VA % pred: 44 %
DL/VA: 1.71 ml/min/mmHg/L
DLCO cor % pred: 30 %
DLCO cor: 7.26 ml/min/mmHg
DLCO unc % pred: 30 %
DLCO unc: 7.26 ml/min/mmHg
FEF 25-75 Post: 2.37 L/sec
FEF 25-75 Pre: 1.86 L/sec
FEF2575-%Change-Post: 27 %
FEF2575-%Pred-Post: 126 %
FEF2575-%Pred-Pre: 99 %
FEV1-%Change-Post: 8 %
FEV1-%Pred-Post: 89 %
FEV1-%Pred-Pre: 81 %
FEV1-Post: 2.48 L
FEV1-Pre: 2.28 L
FEV1FVC-%Change-Post: 4 %
FEV1FVC-%Pred-Pre: 108 %
FEV6-%Change-Post: 4 %
FEV6-%Pred-Post: 83 %
FEV6-%Pred-Pre: 80 %
FEV6-Post: 3.07 L
FEV6-Pre: 2.95 L
FEV6FVC-%Pred-Post: 107 %
FEV6FVC-%Pred-Pre: 107 %
FVC-%Change-Post: 4 %
FVC-%Pred-Post: 77 %
FVC-%Pred-Pre: 74 %
FVC-Post: 3.07 L
FVC-Pre: 2.95 L
Post FEV1/FVC ratio: 81 %
Post FEV6/FVC ratio: 100 %
Pre FEV1/FVC ratio: 77 %
Pre FEV6/FVC Ratio: 100 %
RV % pred: 46 %
RV: 1.27 L
TLC % pred: 56 %
TLC: 3.99 L

## 2020-01-15 MED ORDER — BREZTRI AEROSPHERE 160-9-4.8 MCG/ACT IN AERO
2.0000 | INHALATION_SPRAY | Freq: Two times a day (BID) | RESPIRATORY_TRACT | 11 refills | Status: AC
Start: 2020-01-15 — End: ?

## 2020-01-15 MED ORDER — ALBUTEROL SULFATE HFA 108 (90 BASE) MCG/ACT IN AERS: 2.0000 | INHALATION_SPRAY | Freq: Four times a day (QID) | RESPIRATORY_TRACT | 2 refills | Status: DC | PRN

## 2020-01-15 NOTE — Assessment & Plan Note (Signed)
Continue your oxygen at 3 L/min 

## 2020-01-15 NOTE — Patient Instructions (Signed)
We will plan to continue Breztri 2 puffs twice a day. Rinse and gargle after using. This is a maintenance medication. Keep albuterol available to use 2 puffs if you need if shortness of breath, chest tightness, wheezing. Continue cardiac medications as you have been taking them. Continue your oxygen at 3 L/min. You might benefit from doing cardiopulmonary rehab. We can order this at [facility] when you are ready to do so. Please call and let us know. Follow with Dr Lamonte Sakai in 6 months or sooner if you have any problems

## 2020-01-15 NOTE — Assessment & Plan Note (Signed)
Continue cardiac medications as you have been taking them.

## 2020-01-15 NOTE — Assessment & Plan Note (Signed)
We will plan to continue Breztri 2 puffs twice a day. Rinse and gargle after using. This is a maintenance medication. Keep albuterol available to use 2 puffs if you need if shortness of breath, chest tightness, wheezing. You might benefit from doing cardiopulmonary rehab. We can order this at [facility] when you are ready to do so. Please call and let us know. Follow with Dr Lamonte Sakai in 6 months or sooner if you have any problems

## 2020-01-15 NOTE — Progress Notes (Signed)
   Subjective:    Patient ID: Earl Mueller, male    DOB: 07/31/1935, 84 y.o.   MRN: [id number]  HPI 84 year old former smoker (50 pack years) with coronary disease and MI 09/2019 (DES), carotid disease, hypertension with combined systolic and diastolic CHF, chronic kidney disease, PVD, hypothyroidism.  He carries a diagnosis of COPD as well, no PFT available, started on Breztri 3 weeks ago. Had never been on BD before.  He is referred for dyspnea.  He has been admitted to the hospital 3 times since June.  He had exertional angina and systolic heart failure 10/3417 and had a LAD stent done on 09/14/2019, then was admitted in July for an acute ischemic stroke (received TPA).  Finally he was admitted again 9/21-9/25/2021 with hypoxemic respiratory failure, progressive dyspnea.  Is usually on 3 L/min, required 6 L/min at admission.  Found to have flash pulmonary edema on imaging and was aggressively diuresed with improvement.  He is feeling better since hospitalization. He isn't sure that the breztri is helping him very much. He does have daily cough, productive but hasn;t seen it. His CT chest 12/05/19 showed emphysematous changes.   ROV 01/15/20 --84 year old man with coronary disease, hypertension with combined systolic and diastolic CHF, tobacco use and presumed COPD.  He started [place] in late September, continued it at my request last month.  He is on exertional oxygen at 3 L/min.  He reports that he is fairly limited. He does believe that he   Pulmonary function testing done today reviewed by me, shows evidence for mixed obstruction and restriction without a bronchodilator response, severely restricted lung volumes and a decreased diffusion capacity that does not correct when adjusted for his alveolar volume.   Review of Systems As per HPI     Objective:   Physical Exam  Vitals:   01/15/20 1512  BP: 106/70  Pulse: 80  Temp: (!) 97 F (36.1 C)  TempSrc: Temporal  SpO2: 94%  Weight: 138  lb (62.6 kg)  Height: 5' 10.5" (1.791 m)   Gen: Pleasant, thin man, in no distress,  normal affect, on [place] O2 3L/min  ENT: No lesions,  mouth clear,  oropharynx clear, no postnasal drip  Neck: No JVD, no stridor  Lungs: No use of accessory muscles, no crackles or wheezing on normal respiration, no wheeze on forced expiration  Cardiovascular: RRR, heart sounds normal, no murmur or gallops, no peripheral edema  Musculoskeletal: No deformities, no cyanosis or clubbing  Neuro: alert, awake, non focal  Skin: Warm, no lesions or rash      Assessment & Plan:  COPD (chronic obstructive pulmonary disease) (HCC) We will plan to continue Breztri 2 puffs twice a day. Rinse and gargle after using. This is a maintenance medication. Keep albuterol available to use 2 puffs if you need if shortness of breath, chest tightness, wheezing. You might benefit from doing cardiopulmonary rehab. We can order this at [facility] when you are ready to do so. Please call and let us know. Follow with Dr Lamonte Sakai in 6 months or sooner if you have any problems  Chronic respiratory failure with hypoxia ([place]) Continue your oxygen at 3 L/min.  Chronic combined systolic and diastolic CHF (congestive heart failure) (HCC) Continue cardiac medications as you have been taking them.  Baltazar Apo, MD, PhD 01/15/2020, 3:50 PM [facility] [phone] or if no answer [phone]

## 2020-01-15 NOTE — Progress Notes (Signed)
Full PFT performed today. °

## 2020-01-16 NOTE — Progress Notes (Signed)
Carelink Summary Report / Loop Recorder 

## 2020-01-17 ENCOUNTER — Telehealth: Payer: Self-pay

## 2020-01-17 DIAGNOSIS — J449 Chronic obstructive pulmonary disease, unspecified: Secondary | ICD-10-CM | POA: Diagnosis not present

## 2020-01-17 NOTE — Chronic Care Management (AMB) (Signed)
Spoke to [place] (wife) via phone. She states that Earl Mueller has agreed to participate in Cardio Pulmonary Rehab at [facility]. She is pleased and hopes this will help him.   Patient is now on Breztri and it is expensive each month. Pharmacist coordinating patient assistance form for patient and will have for signature during 01/18/2020 lab visit.   Sherre Poot, PharmD, [facility] Clinical Pharmacist [facility] [phone] (office) [phone] (mobile)

## 2020-01-18 ENCOUNTER — Other Ambulatory Visit: Payer: Medicare HMO

## 2020-01-18 DIAGNOSIS — E785 Hyperlipidemia, unspecified: Secondary | ICD-10-CM

## 2020-01-18 DIAGNOSIS — I5043 Acute on chronic combined systolic (congestive) and diastolic (congestive) heart failure: Secondary | ICD-10-CM

## 2020-01-18 DIAGNOSIS — N183 Chronic kidney disease, stage 3 unspecified: Secondary | ICD-10-CM | POA: Diagnosis not present

## 2020-01-19 ENCOUNTER — Encounter: Payer: Self-pay | Admitting: Cardiology

## 2020-01-19 ENCOUNTER — Other Ambulatory Visit: Payer: Self-pay

## 2020-01-19 ENCOUNTER — Ambulatory Visit: Payer: Medicare HMO | Admitting: Cardiology

## 2020-01-19 VITALS — BP 104/60 | HR 64 | Ht 70.5 in | Wt 137.8 lb

## 2020-01-19 DIAGNOSIS — Z87891 Personal history of nicotine dependence: Secondary | ICD-10-CM

## 2020-01-19 DIAGNOSIS — I2510 Atherosclerotic heart disease of native coronary artery without angina pectoris: Secondary | ICD-10-CM | POA: Diagnosis not present

## 2020-01-19 DIAGNOSIS — I10 Essential (primary) hypertension: Secondary | ICD-10-CM | POA: Diagnosis not present

## 2020-01-19 DIAGNOSIS — E785 Hyperlipidemia, unspecified: Secondary | ICD-10-CM

## 2020-01-19 DIAGNOSIS — I255 Ischemic cardiomyopathy: Secondary | ICD-10-CM | POA: Diagnosis not present

## 2020-01-19 LAB — CBC WITH DIFF/PLATELET
Basophils Absolute: 0.1 10*3/uL (ref 0.0–0.2)
Basos: 1 %
EOS (ABSOLUTE): 0.5 10*3/uL — ABNORMAL HIGH (ref 0.0–0.4)
Eos: 7 %
Hematocrit: 47.9 % (ref 37.5–51.0)
Hemoglobin: 16.1 g/dL (ref 13.0–17.7)
Immature Grans (Abs): 0.1 10*3/uL (ref 0.0–0.1)
Immature Granulocytes: 2 %
Lymphocytes Absolute: 2.2 10*3/uL (ref 0.7–3.1)
Lymphs: 30 %
MCH: 33.9 pg — ABNORMAL HIGH (ref 26.6–33.0)
MCHC: 33.6 g/dL (ref 31.5–35.7)
MCV: 101 fL — ABNORMAL HIGH (ref 79–97)
Monocytes Absolute: 0.7 10*3/uL (ref 0.1–0.9)
Monocytes: 10 %
Neutrophils Absolute: 3.7 10*3/uL (ref 1.4–7.0)
Neutrophils: 50 %
Platelets: 358 10*3/uL (ref 150–450)
RBC: 4.75 x10E6/uL (ref 4.14–5.80)
RDW: 12.9 % (ref 11.6–15.4)
WBC: 7.2 10*3/uL (ref 3.4–10.8)

## 2020-01-19 LAB — COMPREHENSIVE METABOLIC PANEL
ALT: 19 IU/L (ref 0–44)
AST: 29 IU/L (ref 0–40)
Albumin/Globulin Ratio: 1.3 (ref 1.2–2.2)
Albumin: 4.3 g/dL (ref 3.6–4.6)
Alkaline Phosphatase: 66 IU/L (ref 44–121)
BUN/Creatinine Ratio: 15 (ref 10–24)
BUN: 22 mg/dL (ref 8–27)
Bilirubin Total: 0.6 mg/dL (ref 0.0–1.2)
CO2: 22 mmol/L (ref 20–29)
Calcium: 10.2 mg/dL (ref 8.6–10.2)
Chloride: 101 mmol/L (ref 96–106)
Creatinine, Ser: 1.46 mg/dL — ABNORMAL HIGH (ref 0.76–1.27)
GFR calc Af Amer: 51 mL/min/{1.73_m2} — ABNORMAL LOW (ref >59)
GFR calc non Af Amer: 44 mL/min/{1.73_m2} — ABNORMAL LOW (ref >59)
Globulin, Total: 3.3 g/dL (ref 1.5–4.5)
Glucose: 96 mg/dL (ref 65–99)
Potassium: 5.1 mmol/L (ref 3.5–5.2)
Sodium: 139 mmol/L (ref 134–144)
Total Protein: 7.6 g/dL (ref 6.0–8.5)

## 2020-01-19 LAB — LIPID PANEL
Chol/HDL Ratio: 2.7 ratio (ref 0.0–5.0)
Cholesterol, Total: 146 mg/dL (ref 100–199)
HDL: 54 mg/dL (ref >39)
LDL Chol Calc (NIH): 76 mg/dL (ref 0–99)
Triglycerides: 87 mg/dL (ref 0–149)
VLDL Cholesterol Cal: 16 mg/dL (ref 5–40)

## 2020-01-19 LAB — CARDIOVASCULAR RISK ASSESSMENT

## 2020-01-19 NOTE — Patient Instructions (Signed)

## 2020-01-19 NOTE — Progress Notes (Signed)
Cardiology Office Note:    Date:  01/19/2020   ID:  Earl Mueller, DOB May 10, 1935, MRN [id number]  PCP:  Rochel Brome, MD  Cardiologist:  Jenean Lindau, MD   Referring MD: Rochel Brome, MD    ASSESSMENT:    1. Coronary artery disease involving native coronary artery of native heart without angina pectoris   2. Essential hypertension   3. Ischemic cardiomyopathy   4. Dyslipidemia   5. Former tobacco use    PLAN:    In order of problems listed above:  1. Coronary artery disease: Secondary prevention stressed with the patient.  Importance of compliance with diet medication stressed any vocalized understanding. 2. Essential hypertension: Blood pressure stable 3. Congestive heart failure: He is doing very well with instructions given to him last time about weighing himself on a regular basis.  His wife is very supportive. 4. Mixed dyslipidemia: Lipids were reviewed.  Diet was emphasized.  He is on statin therapy. 5. Patient will be seen in follow-up appointment in 2 months or earlier if the patient has any concerns    Medication Adjustments/Labs and Tests Ordered: Current medicines are reviewed at length with the patient today.  Concerns regarding medicines are outlined above.  No orders of the defined types were placed in this encounter.  No orders of the defined types were placed in this encounter.    No chief complaint on file.    History of Present Illness:    Earl Mueller is a 84 y.o. male.  Patient has past medical history of coronary artery disease and ischemic cardiomyopathy.  He has history of congestive heart failure essential hypertension dyslipidemia and chronic renal insufficiency.  He has had history of stroke in the past.  He denies any problems at this time and takes care of activities of daily living.  His wife has been watching his diet very meticulously.  At the time of my evaluation, the patient is alert awake oriented and in no distress.  Past  Medical History:  Diagnosis Date  . Acute on chronic combined systolic and diastolic CHF (congestive heart failure) ([place]) 12/05/2019  . Acute on chronic respiratory failure with hypoxia ([place]) 12/05/2019  . Anxiety   . Aortic atherosclerosis ([place]) 09/25/2019  . Arthritis   . CAD (coronary artery disease)    a.  s/p DES to LAD/LCx in 09/2019.  . Cardiomyopathy, ischemic 09/25/2019  . Carotid artery stenosis    a. s/p L CEA 2019. b. 09/2019: Per neurology's note, his long term BP goal is 130-150 with high grade stenosis bilateral ICA siphon and right petrous ICA.  Marland Kitchen Carotid bruit 11/25/2016  . Cerebral embolism with cerebral infarction - multifocal, s/p tPA, source unknown  09/25/2019  . CHF exacerbation ([place]) 12/05/2019  . Chronic combined systolic and diastolic CHF (congestive heart failure) ([place])   . Chronic respiratory failure ([place])   . Chronic respiratory failure with hypoxia ([place]) 10/29/2019  . CKD (chronic kidney disease), stage III ([place])   . COPD (chronic obstructive pulmonary disease) ([place])   . Coronary artery disease involving native coronary artery of native heart without angina pectoris 11/20/2014  . CVA (cerebral vascular accident) ([place])   . Depression 09/25/2019  . Dyslipidemia 11/20/2014  . Essential hypertension   . Exertional dyspnea 09/13/2019  . Former tobacco use   . Hemiparesis, speech and language deficits, and cognitive deficits due to recent cerebral infarction ([place]) 09/25/2019  . Hypertensive heart disease without heart failure 10/29/2019  . Hypokalemia 09/25/2019  .  Hypothyroidism   . Ischemic cardiomyopathy   . Ischemic heart disease 09/13/2019  . Leukocytosis 12/05/2019  . Malnutrition of moderate degree 12/07/2019  . Medicare annual wellness visit, subsequent 12/02/2019  . Need for immunization against influenza 12/02/2019  . Peripheral vascular disease ([place])    a. s/p aortobifem BPG 2007.  . Pulmonary emphysema ([place]) 09/25/2019  . Symptomatic carotid artery stenosis  03/05/2017  . Unstable angina [facility])     Past Surgical History:  Procedure Laterality Date  . BACK SURGERY    . CARDIAC CATHETERIZATION  03/06/10   NL-LM, 40%pLAD, 30% mid/distal LAD, 98% ostial DIAG (small), 30%pCx, 50%mCx, 30%dCx, 30% OM2, 40%dRCA; Med RX rec (HPR)  . caritid endart    . CORONARY ATHERECTOMY N/A 09/14/2019   Procedure: CORONARY ATHERECTOMY;  Surgeon: Martinique, Peter M, MD;  Location: [place] CV LAB;  Service: Cardiovascular;  Laterality: N/A;  . CORONARY ATHERECTOMY N/A 09/15/2019   Procedure: CORONARY ATHERECTOMY;  Surgeon: Martinique, Peter M, MD;  Location: [place] [facility];  Service: Cardiovascular;  Laterality: N/A;  . CORONARY STENT INTERVENTION N/A 09/14/2019   Procedure: CORONARY STENT INTERVENTION;  Surgeon: Martinique, Peter M, MD;  Location: [place] [facility];  Service: Cardiovascular;  Laterality: N/A;  . CORONARY STENT INTERVENTION N/A 09/15/2019   Procedure: CORONARY STENT INTERVENTION;  Surgeon: Martinique, Peter M, MD;  Location: [place] [facility];  Service: Cardiovascular;  Laterality: N/A;  . ENDARTERECTOMY Left 03/26/2017   Procedure: ENDARTERECTOMY CAROTID LEFT;  Surgeon: Rosetta Posner, MD;  Location[facility];  Service: Vascular;  Laterality: Left;  . femerao bypass    . LEFT HEART CATH AND CORONARY ANGIOGRAPHY N/A 02/03/2017   Procedure: LEFT HEART CATH AND CORONARY ANGIOGRAPHY;  Surgeon: Martinique, Peter M, MD;  Location: [place] CV LAB;  Service: Cardiovascular;  Laterality: N/A;  . LEFT HEART CATH AND CORONARY ANGIOGRAPHY N/A 09/14/2019   Procedure: LEFT HEART CATH AND CORONARY ANGIOGRAPHY;  Surgeon: Martinique, Peter M, MD;  Location: [place] CV LAB;  Service: Cardiovascular;  Laterality: N/A;  . LOOP RECORDER INSERTION N/A 09/25/2019   Procedure: LOOP RECORDER INSERTION;  Surgeon: Deboraha Sprang, MD;  Location: [place] CV LAB;  Service: Cardiovascular;  Laterality: N/A;  . PATCH ANGIOPLASTY Left 03/26/2017   Procedure: PATCH ANGIOPLASTY LEFT CAROTID ARTERY;   Surgeon: Rosetta Posner, MD;  Location: [place];  Service: Vascular;  Laterality: Left;  . SCALP LACERATION REPAIR N/A 05/23/2013   Procedure: EXCISION SCALP ULCER DEBRIDEMENT OF SKIN AND BONE WITH PLACEMENT OF A-CELL;  Surgeon: Irene Limbo, MD;  Location: [place];  Service: Plastics;  Laterality: N/A;  . STOMACH SURGERY     x2  . VASCULAR SURGERY     AFBG 10/15/05    Current Medications: Current Meds  Medication Sig  . Acetaminophen 500 MG capsule Take 2 capsules (1,000 mg total) by mouth every 6 (six) hours as needed for fever or pain.  Marland Kitchen albuterol (VENTOLIN HFA) 108 (90 Base) MCG/ACT inhaler Inhale 2 puffs into the lungs every 6 (six) hours as needed for wheezing or shortness of breath.  Marland Kitchen aspirin EC 81 MG tablet Take 81 mg by mouth daily.  Marland Kitchen atorvastatin (LIPITOR) 80 MG tablet TAKE 1 TABLET EVERY DAY (Patient taking differently: Take 80 mg by mouth daily. )  . Budeson-Glycopyrrol-Formoterol (BREZTRI AEROSPHERE) 160-9-4.8 MCG/ACT AERO Inhale 2 puffs into the lungs in the morning and at bedtime.  . cilostazol (PLETAL) 100 MG tablet Take 100 mg by mouth 2 (two) times  daily.  . clopidogrel (PLAVIX) 75 MG tablet TAKE 1 TABLET EVERY DAY (Patient taking differently: Take 75 mg by mouth daily. )  . fenofibrate 160 MG tablet Take 160 mg by mouth daily.  . furosemide (LASIX) 20 MG tablet Take 1 tablet (20 mg total) by mouth daily.  Marland Kitchen levothyroxine (SYNTHROID) 112 MCG tablet Take 1 tablet (112 mcg total) by mouth daily.  Marland Kitchen losartan (COZAAR) 25 MG tablet Take 0.5 tablets (12.5 mg total) by mouth daily.  . metoprolol succinate (TOPROL-XL) 25 MG 24 hr tablet Take 1 tablet (25 mg total) by mouth daily.  . Multiple Vitamins-Minerals (CENTRUM SILVER PO) Take 1 tablet by mouth daily.  . nitroGLYCERIN (NITROSTAT) 0.4 MG SL tablet Place 0.4 mg under the tongue every 5 (five) minutes as needed for chest pain.  Marland Kitchen PARoxetine (PAXIL) 20 MG tablet TAKE 1 TABLET EVERY DAY (Patient taking differently: Take 20 mg by  mouth daily. )     Allergies:   Other   Social History   Socioeconomic History  . Marital status: Married    Spouse name: Not on file  . Number of children: Not on file  . Years of education: Not on file  . Highest education level: Not on file  Occupational History  . Not on file  Tobacco Use  . Smoking status: Former Smoker    Quit date: 12/18/2007    Years since quitting: 12.0  . Smokeless tobacco: Never Used  Vaping Use  . Vaping Use: Never used  Substance and Sexual Activity  . Alcohol use: Yes    Comment: occ  . Drug use: No  . Sexual activity: Not on file  Other Topics Concern  . Not on file  Social History Narrative  . Not on file   Social Determinants of Health   Financial Resource Strain:   . Difficulty of Paying Living Expenses: Not on file  Food Insecurity: No Food Insecurity  . Worried About Charity fundraiser in the Last Year: Never true  . Ran Out of Food in the Last Year: Never true  Transportation Needs: No Transportation Needs  . Lack of Transportation (Medical): No  . Lack of Transportation (Non-Medical): No  Physical Activity: Inactive  . Days of Exercise per Week: 0 days  . Minutes of Exercise per Session: 0 min  Stress:   . Feeling of Stress : Not on file  Social Connections:   . Frequency of Communication with Friends and Family: Not on file  . Frequency of Social Gatherings with Friends and Family: Not on file  . Attends Religious Services: Not on file  . Active Member of Clubs or Organizations: Not on file  . Attends Archivist Meetings: Not on file  . Marital Status: Not on file     Family History: The patient's family history includes Heart failure in his father.  ROS:   Please see the history of present illness.    All other systems reviewed and are negative.  EKGs/Labs/Other Studies Reviewed:    The following studies were reviewed today: I discussed my findings with the patient in extensive length including lab  work done by primary care yesterday.   Recent Labs: 12/05/2019: B Natriuretic Peptide 1,176.6 12/06/2019: Magnesium 2.0; TSH 1.728 01/18/2020: ALT 19; BUN 22; Creatinine, Ser 1.46; Hemoglobin 16.1; Platelets 358; Potassium 5.1; Sodium 139  Recent Lipid Panel    Component Value Date/Time   CHOL 146 01/18/2020 1133   TRIG 87 01/18/2020 1133  HDL 54 01/18/2020 1133   CHOLHDL 2.7 01/18/2020 1133   CHOLHDL 2.4 09/23/2019 0257   VLDL 10 09/23/2019 0257   LDLCALC 76 01/18/2020 1133    Physical Exam:    VS:  BP 104/60   Pulse 64   Ht 5' 10.5" (1.791 m)   Wt 137 lb 12.8 oz (62.5 kg)   SpO2 (!) 87%   BMI 19.49 kg/m     Wt Readings from Last 3 Encounters:  01/19/20 137 lb 12.8 oz (62.5 kg)  01/15/20 138 lb (62.6 kg)  12/19/19 137 lb 9.6 oz (62.4 kg)     GEN: Patient is in no acute distress HEENT: Normal NECK: No JVD; No carotid bruits LYMPHATICS: No lymphadenopathy CARDIAC: Hear sounds regular, 2/6 systolic murmur at the apex. RESPIRATORY:  Clear to auscultation without rales, wheezing or rhonchi  ABDOMEN: Soft, non-tender, non-distended MUSCULOSKELETAL:  No edema; No deformity  SKIN: Warm and dry NEUROLOGIC:  Alert and oriented x 3 PSYCHIATRIC:  Normal affect   Signed, Jenean Lindau, MD  01/19/2020 12:01 PM    [facility]

## 2020-01-21 ENCOUNTER — Other Ambulatory Visit: Payer: Self-pay | Admitting: Family Medicine

## 2020-01-22 ENCOUNTER — Other Ambulatory Visit: Payer: Self-pay

## 2020-01-22 MED ORDER — LOSARTAN POTASSIUM 25 MG PO TABS
12.5000 mg | ORAL_TABLET | Freq: Every day | ORAL | 0 refills | Status: DC
Start: 2020-01-22 — End: 2020-01-23

## 2020-01-23 ENCOUNTER — Encounter: Payer: Self-pay | Admitting: Family Medicine

## 2020-01-23 ENCOUNTER — Other Ambulatory Visit: Payer: Self-pay

## 2020-01-23 ENCOUNTER — Ambulatory Visit (INDEPENDENT_AMBULATORY_CARE_PROVIDER_SITE_OTHER): Payer: Medicare HMO | Admitting: Family Medicine

## 2020-01-23 VITALS — BP 128/68 | HR 68 | Temp 97.5°F | Ht 71.0 in | Wt 138.0 lb

## 2020-01-23 DIAGNOSIS — E039 Hypothyroidism, unspecified: Secondary | ICD-10-CM | POA: Diagnosis not present

## 2020-01-23 DIAGNOSIS — D6869 Other thrombophilia: Secondary | ICD-10-CM

## 2020-01-23 DIAGNOSIS — I6782 Cerebral ischemia: Secondary | ICD-10-CM | POA: Diagnosis not present

## 2020-01-23 DIAGNOSIS — I119 Hypertensive heart disease without heart failure: Secondary | ICD-10-CM | POA: Diagnosis not present

## 2020-01-23 DIAGNOSIS — J9611 Chronic respiratory failure with hypoxia: Secondary | ICD-10-CM | POA: Diagnosis not present

## 2020-01-23 DIAGNOSIS — N1832 Chronic kidney disease, stage 3b: Secondary | ICD-10-CM | POA: Diagnosis not present

## 2020-01-23 DIAGNOSIS — S0083XA Contusion of other part of head, initial encounter: Secondary | ICD-10-CM | POA: Diagnosis not present

## 2020-01-23 DIAGNOSIS — I10 Essential (primary) hypertension: Secondary | ICD-10-CM

## 2020-01-23 DIAGNOSIS — S0012XA Contusion of left eyelid and periocular area, initial encounter: Secondary | ICD-10-CM

## 2020-01-23 DIAGNOSIS — J9601 Acute respiratory failure with hypoxia: Secondary | ICD-10-CM

## 2020-01-23 DIAGNOSIS — S0101XA Laceration without foreign body of scalp, initial encounter: Secondary | ICD-10-CM | POA: Diagnosis not present

## 2020-01-23 DIAGNOSIS — G319 Degenerative disease of nervous system, unspecified: Secondary | ICD-10-CM | POA: Diagnosis not present

## 2020-01-23 DIAGNOSIS — I70213 Atherosclerosis of native arteries of extremities with intermittent claudication, bilateral legs: Secondary | ICD-10-CM | POA: Diagnosis not present

## 2020-01-23 DIAGNOSIS — I2510 Atherosclerotic heart disease of native coronary artery without angina pectoris: Secondary | ICD-10-CM

## 2020-01-23 DIAGNOSIS — F330 Major depressive disorder, recurrent, mild: Secondary | ICD-10-CM | POA: Diagnosis not present

## 2020-01-23 DIAGNOSIS — J449 Chronic obstructive pulmonary disease, unspecified: Secondary | ICD-10-CM | POA: Diagnosis not present

## 2020-01-23 DIAGNOSIS — I700 Atherosclerosis of aorta: Secondary | ICD-10-CM | POA: Diagnosis not present

## 2020-01-23 DIAGNOSIS — R9082 White matter disease, unspecified: Secondary | ICD-10-CM | POA: Diagnosis not present

## 2020-01-23 HISTORY — DX: Other thrombophilia: D68.69

## 2020-01-23 HISTORY — DX: Contusion of left eyelid and periocular area, initial encounter: S00.12XA

## 2020-01-23 HISTORY — DX: Acute respiratory failure with hypoxia: J96.01

## 2020-01-23 NOTE — Progress Notes (Signed)
Subjective:  Patient ID: Earl Mueller, male    DOB: June 27, 1935  Age: 84 y.o. MRN: [id number]  Chief Complaint  Patient presents with   Hyperlipidemia   Hypothyroidism   Depression    HPI  Hypertensive heart disease: Currently on toprol xl 25 mg daily., losartan 25 mg 1/2 daily, plavix 75 mg daily, aspirin daily, and lasix 20 mg once daily.    He is tolerating the medication well without side effects.  Compliance with treatment has been good; he takes his medication as directed and follows up as directed.    Earl Mueller presents with a diagnosis of atherosclerosis of native arteries of extremities with intermittent claudication, bilateral legs and aortic atherosclerosis.  He does not exercise as his pain is limiting.Currently on plavix and aspirin. He agreed to go to cardiopulmonary rehab, but has not started yet.     In regard to the major depressive disorder, recurrent episode, mild, this is a routine follow-up.  Presently, he feels a mild degree of depression.  Current medications include Paxil.  He denies any affective symptoms, including anhedonia and sadness.      Earl Mueller presents with a diagnosis of impaired fasting glucose.  Eats whatever his wife makes.    Pt presents with hyperlipidemia.  Current treatment includes Lipitor and Tricor.  Compliance with treatment has been good; he takes his medication as directed, maintains his low cholesterol diet, and follows up as directed.  He denies experiencing any hypercholesterolemia related symptoms.      Concerning other specified hypothyroidism, he is currently taking Synthroid, 112 mcg daily.  He denies any related symptoms.    Prediabetes: eats healthy.   Current Outpatient Medications on File Prior to Visit  Medication Sig Dispense Refill   Acetaminophen 500 MG capsule Take 2 capsules (1,000 mg total) by mouth every 6 (six) hours as needed for fever or pain. 30 capsule 0   albuterol (VENTOLIN HFA) 108 (90 Base) MCG/ACT inhaler Inhale  2 puffs into the lungs every 6 (six) hours as needed for wheezing or shortness of breath. 8 g 2   aspirin EC 81 MG tablet Take 81 mg by mouth daily.     atorvastatin (LIPITOR) 80 MG tablet TAKE 1 TABLET EVERY DAY (Patient taking differently: Take 80 mg by mouth daily. ) 90 tablet 0   Budeson-Glycopyrrol-Formoterol (BREZTRI AEROSPHERE) 160-9-4.8 MCG/ACT AERO Inhale 2 puffs into the lungs in the morning and at bedtime. 10.7 g 11   cilostazol (PLETAL) 100 MG tablet Take 100 mg by mouth 2 (two) times daily.     clopidogrel (PLAVIX) 75 MG tablet TAKE 1 TABLET EVERY DAY (Patient taking differently: Take 75 mg by mouth daily. ) 90 tablet 3   fenofibrate 160 MG tablet Take 160 mg by mouth daily.     furosemide (LASIX) 20 MG tablet Take 1 tablet (20 mg total) by mouth daily. 30 tablet 11   levothyroxine (SYNTHROID) 112 MCG tablet Take 1 tablet (112 mcg total) by mouth daily. 90 tablet 0   Multiple Vitamins-Minerals (CENTRUM SILVER PO) Take 1 tablet by mouth daily.     nitroGLYCERIN (NITROSTAT) 0.4 MG SL tablet Place 0.4 mg under the tongue every 5 (five) minutes as needed for chest pain.     PARoxetine (PAXIL) 20 MG tablet TAKE 1 TABLET EVERY DAY (Patient taking differently: Take 20 mg by mouth daily. ) 90 tablet 3   SHINGRIX injection      No current facility-administered medications on file prior to visit.  Past Medical History:  Diagnosis Date   Acute on chronic combined systolic and diastolic CHF (congestive heart failure) ([place]) 12/05/2019   Acute on chronic respiratory failure with hypoxia (H[facility]) 12/05/2019   Anxiety    Aortic atherosclerosis ([place]) 09/25/2019   Arthritis    CAD (coronary artery disease)    a.  s/p DES to LAD/LCx in 09/2019.   Cardiomyopathy, ischemic 09/25/2019   Carotid artery stenosis    a. s/p L CEA 2019. b. 09/2019: Per neurology's note, his long term BP goal is 130-150 with high grade stenosis bilateral ICA siphon and right petrous ICA.   Carotid bruit  11/25/2016   Cerebral embolism with cerebral infarction - multifocal, s/p tPA, source unknown  09/25/2019   CHF exacerbation ([place]) 12/05/2019   Chronic combined systolic and diastolic CHF (congestive heart failure) (H[facility])    Chronic respiratory failure (HCC)    Chronic respiratory failure with hypoxia ([place]) 10/29/2019   CKD (chronic kidney disease), stage III (HCC)    COPD (chronic obstructive pulmonary disease) ([place])    Coronary artery disease involving native coronary artery of native heart without angina pectoris 11/20/2014   CVA (cerebral vascular accident) ([place])    Depression 09/25/2019   Dyslipidemia 11/20/2014   Essential hypertension    Exertional dyspnea 09/13/2019   Former tobacco use    Hemiparesis, speech and language deficits, and cognitive deficits due to recent cerebral infarction ([place]) 09/25/2019   Hypertensive heart disease without heart failure 10/29/2019   Hypokalemia 09/25/2019   Hypothyroidism    Ischemic cardiomyopathy    Ischemic heart disease 09/13/2019   Leukocytosis 12/05/2019   Malnutrition of moderate degree 12/07/2019   Medicare annual wellness visit, subsequent 12/02/2019   Need for immunization against influenza 12/02/2019   Peripheral vascular disease ([place])    a. s/p aortobifem BPG 2007.   Pulmonary emphysema ([place]) 09/25/2019   Symptomatic carotid artery stenosis 03/05/2017   Unstable angina [facility])    Past Surgical History:  Procedure Laterality Date   BACK SURGERY     CARDIAC CATHETERIZATION  03/06/10   NL-LM, 40%pLAD, 30% mid/distal LAD, 98% ostial DIAG (small), 30%pCx, 50%mCx, 30%dCx, 30% OM2, 40%dRCA; Med RX rec (HPR)   caritid endart     CORONARY ATHERECTOMY N/A 09/14/2019   Procedure: CORONARY ATHERECTOMY;  Surgeon: Martinique, Peter M, MD;  Location: [place] CV LAB;  Service: Cardiovascular;  Laterality: N/A;   CORONARY ATHERECTOMY N/A 09/15/2019   Procedure: CORONARY ATHERECTOMY;  Surgeon: Martinique, Peter M, MD;  Location: [place] CV LAB;  Service: Cardiovascular;  Laterality: N/A;   CORONARY STENT INTERVENTION N/A 09/14/2019   Procedure: CORONARY STENT INTERVENTION;  Surgeon: Martinique, Peter M, MD;  Location: [place] CV LAB;  Service: Cardiovascular;  Laterality: N/A;   CORONARY STENT INTERVENTION N/A 09/15/2019   Procedure: CORONARY STENT INTERVENTION;  Surgeon: Martinique, Peter M, MD;  Location: [place] CV LAB;  Service: Cardiovascular;  Laterality: N/A;   ENDARTERECTOMY Left 03/26/2017   Procedure: ENDARTERECTOMY CAROTID LEFT;  Surgeon: Rosetta Posner, MD;  Location: [facility] OR;  Service: Vascular;  Laterality: Left;   femerao bypass     LEFT HEART CATH AND CORONARY ANGIOGRAPHY N/A 02/03/2017   Procedure: LEFT HEART CATH AND CORONARY ANGIOGRAPHY;  Surgeon: Martinique, Peter M, MD;  Location: [place] CV LAB;  Service: Cardiovascular;  Laterality: N/A;   LEFT HEART CATH AND CORONARY ANGIOGRAPHY N/A 09/14/2019   Procedure: LEFT HEART CATH AND CORONARY ANGIOGRAPHY;  Surgeon: Martinique, Peter M, MD;  Location: [place]  CV LAB;  Service: Cardiovascular;  Laterality: N/A;   LOOP RECORDER INSERTION N/A 09/25/2019   Procedure: LOOP RECORDER INSERTION;  Surgeon: Deboraha Sprang, MD;  Location: [place] CV LAB;  Service: Cardiovascular;  Laterality: N/A;   PATCH ANGIOPLASTY Left 03/26/2017   Procedure: PATCH ANGIOPLASTY LEFT CAROTID ARTERY;  Surgeon: Rosetta Posner, MD;  Location: [facility] OR;  Service: Vascular;  Laterality: Left;   SCALP LACERATION REPAIR N/A 05/23/2013   Procedure: EXCISION SCALP ULCER DEBRIDEMENT OF SKIN AND BONE WITH PLACEMENT OF A-CELL;  Surgeon: Irene Limbo, MD;  Location: [place];  Service: Plastics;  Laterality: N/A;   STOMACH SURGERY     x2   VASCULAR SURGERY     AFBG 10/15/05    Family History  Problem Relation Age of Onset   Heart failure Father    Social History   Socioeconomic History   Marital status: Married    Spouse name: Not on file   Number of children: Not on file    Years of education: Not on file   Highest education level: Not on file  Occupational History   Not on file  Tobacco Use   Smoking status: Former Smoker    Quit date: 12/18/2007    Years since quitting: 12.1   Smokeless tobacco: Never Used  Vaping Use   Vaping Use: Never used  Substance and Sexual Activity   Alcohol use: Yes    Comment: occ   Drug use: No   Sexual activity: Not on file  Other Topics Concern   Not on file  Social History Narrative   Not on file   Social Determinants of Health   Financial Resource Strain:    Difficulty of Paying Living Expenses: Not on file  Food Insecurity: No Food Insecurity   Worried About [place] in the Last Year: Never true   [place] in the Last Year: Never true  Transportation Needs: No Transportation Needs   Lack of Transportation (Medical): No   Lack of Transportation (Non-Medical): No  Physical Activity: Inactive   Days of Exercise per Week: 0 days   Minutes of Exercise per Session: 0 min  Stress:    Feeling of Stress : Not on file  Social Connections:    Frequency of Communication with Friends and Family: Not on file   Frequency of Social Gatherings with Friends and Family: Not on file   Attends Religious Services: Not on file   Active Member of Clubs or Organizations: Not on file   Attends Archivist Meetings: Not on file   Marital Status: Not on file   ROS Review of Systems  Constitutional: Negative for chills, diaphoresis, fatigue and fever.  HENT: Negative for congestion, ear pain and sore throat.   Respiratory: Negative for cough and shortness of breath (ok unless removes oxygen).   Cardiovascular: Negative for chest pain and leg swelling.  Gastrointestinal: Negative for abdominal pain, constipation, diarrhea, nausea and vomiting.  Genitourinary: Negative for dysuria and urgency.  Musculoskeletal: Negative for arthralgias and myalgias.  Neurological: Negative for  dizziness and headaches.  Psychiatric/Behavioral: Negative for dysphoric mood.     Objective:  BP 128/68    Pulse 68    Temp (!) 97.5 F (36.4 C)    Ht 5\' 11"  (1.803 m)    Wt 138 lb (62.6 kg)    SpO2 98% Comment: 4L O2   BMI 19.25 kg/m   BP/Weight 01/23/2020 01/19/2020 32/11/5186  Systolic BP 416  626 948  Diastolic BP 68 60 70  Wt. (Lbs) 138 137.8 138  BMI 19.25 19.49 19.52    Physical Exam Vitals reviewed.  Constitutional:      Appearance: Normal appearance. He is normal weight.  Neck:     Vascular: No carotid bruit.  Cardiovascular:     Rate and Rhythm: Normal rate and regular rhythm.  Pulmonary:     Effort: Pulmonary effort is normal.     Breath sounds: Normal breath sounds.     Comments: On O2 2 Liters. Abdominal:     General: Abdomen is flat. Bowel sounds are normal.     Palpations: Abdomen is soft.  Neurological:     Mental Status: He is alert and oriented to person, place, and time.  Psychiatric:        Mood and Affect: Mood normal.        Behavior: Behavior normal.     Lab Results  Component Value Date   WBC 7.2 01/18/2020   HGB 16.1 01/18/2020   HCT 47.9 01/18/2020   PLT 358 01/18/2020   GLUCOSE 96 01/18/2020   CHOL 146 01/18/2020   TRIG 87 01/18/2020   HDL 54 01/18/2020   LDLCALC 76 01/18/2020   ALT 19 01/18/2020   AST 29 01/18/2020   NA 139 01/18/2020   K 5.1 01/18/2020   CL 101 01/18/2020   CREATININE 1.46 (H) 01/18/2020   BUN 22 01/18/2020   CO2 22 01/18/2020   TSH 1.728 12/06/2019   INR 1.2 09/21/2019   HGBA1C 5.6 09/23/2019      Assessment & Plan:  1. Acquired hypothyroidism The current medical regimen is effective;  continue present plan and medications.  2. Hypertensive heart disease without heart failure Well controlled.  No changes to medicines.  Continue to work on eating a healthy diet and exercise.  Labs reviewed today.  - losartan (COZAAR) 25 MG tablet; Take 0.5 tablets (12.5 mg total) by mouth daily.  Dispense: 45 tablet;  Refill: 0 - metoprolol succinate (TOPROL-XL) 25 MG 24 hr tablet; Take 1 tablet (25 mg total) by mouth daily.  Dispense: 90 tablet; Refill: 0  3. Chronic respiratory failure with hypoxia (HCC) The current medical regimen is effective;  continue present plan and medications. Continue oxygen 2-3 L daily.  4. Acquired thrombophilia ([place]) Due to medicines.  5. Contusion of other part of head, initial encounter  Improved.   6. Aortic atherosclerosis (HCC) The current medical regimen is effective;  continue present plan and medications.  7. Coronary artery disease involving native coronary artery of native heart without angina pectoris The current medical regimen is effective;  continue present plan and medications.  8. Hypothyroidism (acquired) The current medical regimen is effective;  continue present plan and medications.  9. Stage 3b chronic kidney disease ([place]) Stable.   My nursing staff have aided in the documentation of this note on the behalf of Rochel Brome, MD,as directed by  Rochel Brome, MD and thoroughly reviewed by Rochel Brome, MD.  Follow-up: Return in about 3 months (around 04/24/2020) for fasting.  An After Visit Summary was printed and given to the patient.  Rochel Brome, MD [facility] [phone]

## 2020-01-25 ENCOUNTER — Telehealth: Payer: Self-pay

## 2020-01-25 NOTE — Progress Notes (Signed)
Chronic Care Management Pharmacy Assistant   Name: Earl Mueller  MRN: [id number] DOB: Jul 18, 1935  Reason for Encounter: Medication Review  Patient Questions:   Earl Mueller,  84 y.o. , male   PCP : Rochel Brome, MD  Allergies:   Allergies  Allergen Reactions  . Other Other (See Comments)    Any narcotic makes him a "wild man"  Delusions (intolerance)    Medications: Outpatient Encounter Medications as of 01/25/2020  Medication Sig Note  . Acetaminophen 500 MG capsule Take 2 capsules (1,000 mg total) by mouth every 6 (six) hours as needed for fever or pain.   Marland Kitchen albuterol (VENTOLIN HFA) 108 (90 Base) MCG/ACT inhaler Inhale 2 puffs into the lungs every 6 (six) hours as needed for wheezing or shortness of breath.   Marland Kitchen aspirin EC 81 MG tablet Take 81 mg by mouth daily.   Marland Kitchen atorvastatin (LIPITOR) 80 MG tablet TAKE 1 TABLET EVERY DAY (Patient taking differently: Take 80 mg by mouth daily. )   . Budeson-Glycopyrrol-Formoterol (BREZTRI AEROSPHERE) 160-9-4.8 MCG/ACT AERO Inhale 2 puffs into the lungs in the morning and at bedtime.   . cilostazol (PLETAL) 100 MG tablet Take 100 mg by mouth 2 (two) times daily.   . clopidogrel (PLAVIX) 75 MG tablet TAKE 1 TABLET EVERY DAY (Patient taking differently: Take 75 mg by mouth daily. )   . fenofibrate 160 MG tablet Take 160 mg by mouth daily.   . furosemide (LASIX) 20 MG tablet Take 1 tablet (20 mg total) by mouth daily.   Marland Kitchen levothyroxine (SYNTHROID) 112 MCG tablet Take 1 tablet (112 mcg total) by mouth daily.   Marland Kitchen losartan (COZAAR) 25 MG tablet Take 0.5 tablets (12.5 mg total) by mouth daily.   . metoprolol succinate (TOPROL-XL) 25 MG 24 hr tablet Take 1 tablet (25 mg total) by mouth daily.   . Multiple Vitamins-Minerals (CENTRUM SILVER PO) Take 1 tablet by mouth daily.   . nitroGLYCERIN (NITROSTAT) 0.4 MG SL tablet Place 0.4 mg under the tongue every 5 (five) minutes as needed for chest pain. 12/06/2019: Has on hand at home  . PARoxetine  (PAXIL) 20 MG tablet TAKE 1 TABLET EVERY DAY (Patient taking differently: Take 20 mg by mouth daily. )   . SHINGRIX injection     No facility-administered encounter medications on file as of 01/25/2020.    Current Diagnosis: Patient Active Problem List   Diagnosis Date Noted  . Acquired thrombophilia ([place]) 01/23/2020  . Contusion of left periocular region 01/23/2020  . Acute respiratory failure with hypoxia ([place]) 01/23/2020  . Anxiety   . Arthritis   . CAD (coronary artery disease)   . Chronic combined systolic and diastolic CHF (congestive heart failure) ([place])   . Chronic respiratory failure ([place])   . CKD (chronic kidney disease), stage III ([place])   . COPD (chronic obstructive pulmonary disease) ([place])   . CVA (cerebral vascular accident) ([place])   . Former tobacco use   . Ischemic cardiomyopathy   . Malnutrition of moderate degree 12/07/2019  . CHF exacerbation ([place]) 12/05/2019  . Acute on chronic combined systolic and diastolic CHF (congestive heart failure) ([place]) 12/05/2019  . Acute on chronic respiratory failure with hypoxia ([place]) 12/05/2019  . Leukocytosis 12/05/2019  . Need for immunization against influenza 12/02/2019  . Medicare annual wellness visit, subsequent 12/02/2019  . Chronic respiratory failure with hypoxia ([place]) 10/29/2019  . Hypertensive heart disease without heart failure 10/29/2019  . Hemiparesis, speech and language deficits, and  cognitive deficits due to recent cerebral infarction ([place]) 09/25/2019  . Cerebral embolism with cerebral infarction - multifocal, s/p tPA, source unknown  09/25/2019  . Cardiomyopathy, ischemic 09/25/2019  . Hypothyroidism 09/25/2019  . Depression 09/25/2019  . Hypokalemia 09/25/2019  . Pulmonary emphysema ([place]) 09/25/2019  . Aortic atherosclerosis ([place]) 09/25/2019  . Unstable angina ([place])   . Ischemic heart disease 09/13/2019  . Exertional dyspnea 09/13/2019  . Carotid artery stenosis 03/26/2017  . Symptomatic carotid artery  stenosis 03/05/2017  . Carotid bruit 11/25/2016  . Coronary artery disease involving native coronary artery of native heart without angina pectoris 11/20/2014  . Dyslipidemia 11/20/2014  . Essential hypertension 11/20/2014  . Peripheral vascular disease ([place]) 11/20/2014    Goals Addressed   None     Follow-Up:  Coordination of [employer] Benefits  Adherence report show Losartan 25 mg Tablet needs monitoring. [place] in Aug 81%, Sept 73%, and Oct 81%.  In hospital causing overfill. One medication filled at another facility and remaining pills were sent home with patient.   Pt also reports that despite taking medication daily the emails regarding next fill due are sent from the mail order pharmacy way in advance.

## 2020-01-28 MED ORDER — METOPROLOL SUCCINATE ER 25 MG PO TB24: 25.0000 mg | ORAL_TABLET | Freq: Every day | ORAL | 0 refills | Status: DC

## 2020-01-28 MED ORDER — LOSARTAN POTASSIUM 25 MG PO TABS: 12.5000 mg | ORAL_TABLET | Freq: Every day | ORAL | 0 refills | Status: DC

## 2020-01-29 ENCOUNTER — Ambulatory Visit: Payer: Medicare HMO

## 2020-01-29 ENCOUNTER — Other Ambulatory Visit: Payer: Self-pay

## 2020-01-29 DIAGNOSIS — I2510 Atherosclerotic heart disease of native coronary artery without angina pectoris: Secondary | ICD-10-CM

## 2020-01-29 DIAGNOSIS — I5042 Chronic combined systolic (congestive) and diastolic (congestive) heart failure: Secondary | ICD-10-CM

## 2020-01-29 NOTE — Chronic Care Management (AMB) (Signed)
Chronic Care Management Pharmacy  Name: Earl Mueller  MRN: [id number] DOB: 10-27-1935  Chief Complaint/ HPI  Earl Mueller,  84 y.o. , male presents for their Follow-Up CCM visit with the clinical pharmacist via telephone due to COVID-19 Pandemic.  PCP : Earl Brome, MD  Their chronic conditions include: HTN, Peripheral vascular disease, Carotid artery stenosis, CAD, Hx of stroke, HLD, anxiety, arthritis, hypothyroidism.  Office Visits: 01/23/2020 - continue current medicaitons.  Consult Visit: 01/19/2020 - Cardiology - continue healthy diet for CAD and CHF. BP stable.  01/15/2020 - Pulmonology - continue Breztri and Oxygen. Recommended cardiopulmonary rehab at [facility].  12/19/2019 - Cardiology - low salt diet and daily weight checks ordered.  12/15/2019 - Pulmonology - continue Breztri. PFTs at next office visit. Chest x-ray ordered.   Medications: Outpatient Encounter Medications as of 01/29/2020  Medication Sig Note  . Acetaminophen 500 MG capsule Take 2 capsules (1,000 mg total) by mouth every 6 (six) hours as needed for fever or pain.   Marland Kitchen albuterol (VENTOLIN HFA) 108 (90 Base) MCG/ACT inhaler Inhale 2 puffs into the lungs every 6 (six) hours as needed for wheezing or shortness of breath.   Marland Kitchen aspirin EC 81 MG tablet Take 81 mg by mouth daily.   Marland Kitchen atorvastatin (LIPITOR) 80 MG tablet TAKE 1 TABLET EVERY DAY (Patient taking differently: Take 80 mg by mouth daily. )   . Budeson-Glycopyrrol-Formoterol (BREZTRI AEROSPHERE) 160-9-4.8 MCG/ACT AERO Inhale 2 puffs into the lungs in the morning and at bedtime.   . clopidogrel (PLAVIX) 75 MG tablet TAKE 1 TABLET EVERY DAY (Patient taking differently: Take 75 mg by mouth daily. )   . fenofibrate 160 MG tablet Take 160 mg by mouth daily.   . furosemide (LASIX) 20 MG tablet Take 1 tablet (20 mg total) by mouth daily.   Marland Kitchen levothyroxine (SYNTHROID) 112 MCG tablet Take 1 tablet (112 mcg total) by mouth daily.   Marland Kitchen losartan (COZAAR) 25 MG  tablet Take 0.5 tablets (12.5 mg total) by mouth daily.   . metoprolol succinate (TOPROL-XL) 25 MG 24 hr tablet Take 1 tablet (25 mg total) by mouth daily.   . Multiple Vitamins-Minerals (CENTRUM SILVER PO) Take 1 tablet by mouth daily.   . nitroGLYCERIN (NITROSTAT) 0.4 MG SL tablet Place 0.4 mg under the tongue every 5 (five) minutes as needed for chest pain. 12/06/2019: Has on hand at home  . PARoxetine (PAXIL) 20 MG tablet TAKE 1 TABLET EVERY DAY (Patient taking differently: Take 20 mg by mouth daily. )   . cilostazol (PLETAL) 100 MG tablet Take 100 mg by mouth 2 (two) times daily. (Patient not taking: Reported on 01/29/2020)   . [DISCONTINUED] SHINGRIX injection  (Patient not taking: Reported on 01/29/2020)    No facility-administered encounter medications on file as of 01/29/2020.     Current Diagnosis/Assessment:  Goals Addressed            This Visit's Progress   . Pharmacy Care Plan       CARE PLAN ENTRY  Current Barriers:  . Chronic Disease Management support, education, and care coordination needs related to Hypertension and Hyperlipidemia   Hypertension . Pharmacist Clinical Goal(s): o Over the next 90 days, patient will work with PharmD and providers to maintain BP goal <130/80 . Current regimen:  o Losartan 12.5 mg daily o Metoprolol succinate 25 mg daily . Interventions: o Recommended patient request pharmacy to cut losartan in half due to difficulty.  o Recommended patient continue to  eat heart-healthy diet.  . Patient self care activities - Over the next 90 days, patient will: o Check BP with symptoms, document, and provide at future appointments o Ensure daily salt intake < 2300 mg/day  Hyperlipidemia . Pharmacist Clinical Goal(s): o Over the next 90 days, patient will work with PharmD and providers to maintain LDL goal < 100 . Current regimen:  o Atorvastatin 80 mg daily o Fenofibrate 160 mg daily . Interventions: o Recommend patient continue to eat  heart healthy diet.  o Begin pulmonary rehab once healed from fall.  . Patient self care activities - Over the next 90 days, patient will: o Continue to take medications as prescribed and follow-up with MD as directed.   Medication management . Pharmacist Clinical Goal(s): o Over the next 90 days, patient will work with PharmD and providers to maintain optimal medication adherence . Current pharmacy: [facility] . Interventions o Comprehensive medication review performed. o Continue current medication management strategy . Patient self care activities - Over the next 90 days, patient will: o Focus on medication adherence by continuing to use pill box.  o Take medications as prescribed o Report any questions or concerns to PharmD and/or provider(s)  Please see past updates related to this goal by clicking on the "Past Updates" button in the selected goal        COPD / Asthma / Tobacco   Eosinophil count:   Lab Results  Component Value Date/Time   EOSPCT 3 12/06/2019 12:30 AM  %                               Eos (Absolute):  Lab Results  Component Value Date/Time   EOSABS 0.5 (H) 01/18/2020 11:33 AM    Tobacco Status:  Social History   Tobacco Use  Smoking Status Former Smoker  . Quit date: 12/18/2007  . Years since quitting: 12.1  Smokeless Tobacco Never Used    Patient has failed these meds in past: none reported Patient is currently controlled on the following medications:   Albuterol 2 puffs every 6 hours prn  Breztri 2 puffs twice daily  Using maintenance inhaler regularly? Yes  We discussed:  proper inhaler technique. Patient is still on oxygen. Pharmacist submitted application for [facility] patient assistance and will coordinate renewal for 2022. Patient to enroll in pulmonary rehab once recovered from recent fall.   Plan  Continue current medications   Hyperlipidemia/CAD   Lipid Panel     Component Value Date/Time   CHOL 146 01/18/2020 1133    TRIG 87 01/18/2020 1133   HDL 54 01/18/2020 1133   CHOLHDL 2.7 01/18/2020 1133   CHOLHDL 2.4 09/23/2019 0257   VLDL 10 09/23/2019 0257   LDLCALC 76 01/18/2020 1133   LABVLDL 16 01/18/2020 1133     The ASCVD Risk score (Goff DC Jr., et al., 2013) failed to calculate for the following reasons:   The 2013 ASCVD risk score is only valid for ages 2 to 78   The patient has a prior MI or stroke diagnosis   Patient has failed these meds in past: zetia, cilostazole Patient is currently controlled on the following medications:   aspirin ec 81 mg daily  atorvastatin 80 mg daily  fenofibrate 160 mg daily  nitroglycerin 0.4 mg prn  clopidogrel 75 mg daily  We discussed:  diet and exercise extensively. Patient is unable to exercise. Patient is eating lower sodium diet  lately and wife is preparing all his food. Patient is supposed to begin pulmonary rehab once recovered from recent fall.    Plan  Continue current medications   Heart Failure   BP Readings from Last 3 Encounters:  01/23/20 128/68  01/19/20 104/60  01/15/20 106/70   Lab Results  Component Value Date   CREATININE 1.46 (H) 01/18/2020   BUN 22 01/18/2020   GFRNONAA 44 (L) 01/18/2020   GFRAA 51 (L) 01/18/2020   NA 139 01/18/2020   K 5.1 01/18/2020   CALCIUM 10.2 01/18/2020   CO2 22 01/18/2020   Type: Combined Systolic and Diastolic  Last ejection fraction: 35-40%  Patient has failed these meds in past: none reported Patient is currently controlled on the following medications:   Furosemide 20 mg daily  Losartan 12.5 mg daily  Metoprolol Succinate 25 mg daily   We discussed diet and exercise extensively.   Patient's wife is helping a lot with diet. Goal <1500 mg/day sodium. She ordered a cookbook on [place] that is helping her make his own salad dressings and soups to reduce sodium. Wife reports that is burdensome but seems to be helping. Discussed options of making a larger batch of soups and freezing in  individual portions. Wife will try this and is thankful for recommendation.   Diet: Breakfast - [place] bacon, eggs, toast, hashbrowns. Lunch/supper soup or meat and vegetables.   Exercise: Wife ordered DVD of chair exercises for them to complete. Patient is also supposed to be enrolling in pulmonary rehab once healed.   Patient is not driving currently and uncertain when he can resume it.   Plan  Continue current medications   Hypothyroidism   TSH  Date Value Ref Range Status  12/06/2019 1.728 0.350 - 4.500 uIU/mL Final    Comment:    Performed by a 3rd Generation assay with a functional sensitivity of <=0.01 uIU/mL. Performed at [facility], [place] [address] [place], [place] [address]      Patient has failed these meds in past: n/a Patient is currently uncontrolled on the following medications:   levothyroxine 112 mcg daily  We discussed: Wife reports good compliance with regimen.   Plan  Continue current medications   Vaccines   Reviewed and discussed patient's vaccination history.  Patient's last TDAP was 11/17. Penumovax 23 in 2010.  Patient has had both COVID vaccines. Has had first Shingrix shot and second one will be in July.   Immunization History  Administered Date(s) Administered  . Fluad Quad(high Dose 65+) 11/30/2019  . Influenza-Unspecified 12/18/2016  . PFIZER SARS-COV-2 Vaccination 04/01/2019, 04/22/2019, 01/09/2020  . Pneumococcal Polysaccharide-23 10/25/2008, 09/15/2019  . Tdap 01/15/2016  . Zoster Recombinat (Shingrix) 06/12/2019    Plan  Recommended patient receive annual flu vaccine in office.   Medication Management   Pt uses [facility] mail order pharmacy for all medications Uses pill box? Yes  We discussed: Patient is doing well taking medication as prescribed and wife fills his pill box.   Plan  Continue current medication management strategy    Follow up: 2 month phone visit

## 2020-02-01 NOTE — Patient Instructions (Addendum)
Visit Information  Goals Addressed            This Visit's Progress   . Pharmacy Care Plan       CARE PLAN ENTRY  Current Barriers:  . Chronic Disease Management support, education, and care coordination needs related to Hypertension and Hyperlipidemia   Hypertension . Pharmacist Clinical Goal(s): o Over the next 90 days, patient will work with PharmD and providers to maintain BP goal <130/80 . Current regimen:  o Losartan 12.5 mg daily o Metoprolol succinate 25 mg daily . Interventions: o Recommended patient request pharmacy to cut losartan in half due to difficulty.  o Recommended patient continue to eat heart-healthy diet.  . Patient self care activities - Over the next 90 days, patient will: o Check BP with symptoms, document, and provide at future appointments o Ensure daily salt intake < 2300 mg/day  Hyperlipidemia . Pharmacist Clinical Goal(s): o Over the next 90 days, patient will work with PharmD and providers to maintain LDL goal < 100 . Current regimen:  o Atorvastatin 80 mg daily o Fenofibrate 160 mg daily . Interventions: o Recommend patient continue to eat heart healthy diet.  o Begin pulmonary rehab once healed from fall.  . Patient self care activities - Over the next 90 days, patient will: o Continue to take medications as prescribed and follow-up with MD as directed.   Medication management . Pharmacist Clinical Goal(s): o Over the next 90 days, patient will work with PharmD and providers to maintain optimal medication adherence . Current pharmacy: [facility] . Interventions o Comprehensive medication review performed. o Continue current medication management strategy . Patient self care activities - Over the next 90 days, patient will: o Focus on medication adherence by continuing to use pill box.  o Take medications as prescribed o Report any questions or concerns to PharmD and/or provider(s)  Please see past updates related to this goal  by clicking on the "Past Updates" button in the selected goal         The patient verbalized understanding of instructions, educational materials, and care plan provided today and declined offer to receive copy of patient instructions, educational materials, and care plan.   Telephone follow up appointment with pharmacy team member scheduled for: 03/2020  Sherre Poot, PharmD, [facility] Clinical Pharmacist [facility] [phone] (office) [phone] (mobile)   Heart Failure Eating Plan Heart failure, also called congestive heart failure, occurs when your heart does not pump blood well enough to meet your body's needs for oxygen-rich blood. Heart failure is a long-term (chronic) condition. Living with heart failure can be challenging. However, following your health care provider's instructions about a healthy lifestyle and working with a diet and nutrition specialist (dietitian) to choose the right foods may help to improve your symptoms. What are tips for following this plan? Reading food labels  Check food labels for the amount of sodium per serving. Choose foods that have less than 140 mg (milligrams) of sodium in each serving.  Check food labels for the number of calories per serving. This is important if you need to limit your daily calorie intake to lose weight.  Check food labels for the serving size. If you eat more than one serving, you will be eating more sodium and calories than what is listed on the label.  Look for foods that are labeled as "sodium-free," "very low sodium," or "low sodium." ? Foods labeled as "reduced sodium" or "lightly salted" may still have more  sodium than what is recommended for you. Cooking  Avoid adding salt when cooking. Ask your health care provider or dietitian before using salt substitutes.  Season food with salt-free seasonings, spices, or herbs. Check the label of seasoning mixes to make sure they do not contain salt.  Cook  with heart-healthy oils, such as olive, canola, soybean, or sunflower oil.  Do not fry foods. Cook foods using low-fat methods, such as baking, boiling, grilling, and broiling.  Limit unhealthy fats when cooking by: ? Removing the skin from poultry, such as chicken. ? Removing all visible fats from meats. ? Skimming the fat off from stews, soups, and gravies before serving them. Meal planning   Limit your intake of: ? Processed, canned, or pre-packaged foods. ? Foods that are high in trans fat, such as fried foods. ? Sweets, desserts, sugary drinks, and other foods with added sugar. ? Full-fat dairy products, such as whole milk.  Eat a balanced diet that includes: ? 4-5 servings of fruit each day and 4-5 servings of vegetables each day. At each meal, try to fill half of your plate with fruits and vegetables. ? Up to 6-8 servings of whole grains each day. ? Up to 2 servings of lean meat, poultry, or fish each day. One serving of meat is equal to 3 oz. This is about the same size as a deck of cards. ? 2 servings of low-fat dairy each day. ? Heart-healthy fats. Healthy fats called omega-3 fatty acids are found in foods such as flaxseed and cold-water fish like sardines, salmon, and mackerel.  Aim to eat 25-35 g (grams) of fiber a day. Foods that are high in fiber include apples, broccoli, carrots, beans, peas, and whole grains.  Do not add salt or condiments that contain salt (such as soy sauce) to foods before eating.  When eating at a restaurant, ask that your food be prepared with less salt or no salt, if possible.  Try to eat 2 or more vegetarian meals each week.  Eat more home-cooked food and eat less restaurant, buffet, and fast food. General information  Do not eat more than 2,300 mg of salt (sodium) a day. The amount of sodium that is recommended for you may be lower, depending on your condition.  Maintain a healthy body weight as directed. Ask your health care provider what  a healthy weight is for you. ? Check your weight every day. ? Work with your health care provider and dietitian to make a plan that is right for you to lose weight or maintain your current weight.  Limit how much fluid you drink. Ask your health care provider or dietitian how much fluid you can have each day.  Limit or avoid alcohol as told by your health care provider or dietitian. Recommended foods The items listed may not be a complete list. Talk with your dietitian about what dietary choices are best for you. Fruits All fresh, frozen, and canned fruits. Dried fruits, such as raisins, prunes, and cranberries. Vegetables All fresh vegetables. Vegetables that are frozen without sauce or added salt. Low-sodium or sodium-free canned vegetables. Grains Bread with less than 80 mg of sodium per slice. Whole-wheat pasta, quinoa, and Erikka Follmer rice. Oats and oatmeal. Barley. [place]. Grits and cream of wheat. Whole-grain and whole-wheat cold cereal. Meats and other protein foods Lean cuts of meat. Skinless chicken and [place]. Fish with high omega-3 fatty acids, such as salmon, sardines, and other cold-water fishes. Eggs. Dried beans, peas, and edamame. Unsalted  nuts and nut butters. Dairy Low-fat or nonfat (skim) milk and dried milk. Rice milk, soy milk, and almond milk. Low-fat or nonfat yogurt. Small amounts of reduced-sodium block cheese. Low-sodium cottage cheese. Fats and oils Olive, canola, soybean, flaxseed, or sunflower oil. Avocado. Sweets and desserts Apple sauce. Granola bars. Sugar-free pudding and gelatin. Frozen fruit bars. Seasoning and other foods Fresh and dried herbs. Lemon or lime juice. Vinegar. Low-sodium ketchup. Salt-free marinades, salad dressings, sauces, and seasonings. The items listed above may not be a complete list of foods and beverages you can eat. Contact a dietitian for more information. Foods to avoid The items listed may not be a complete list. Talk with your  dietitian about what dietary choices are best for you. Fruits Fruits that are dried with sodium-containing preservatives. Vegetables Canned vegetables. Frozen vegetables with sauce or seasonings. Creamed vegetables. [place] fries. Onion rings. Pickled vegetables and sauerkraut. Grains Bread with more than 80 mg of sodium per slice. Hot or cold cereal with more than 140 mg sodium per serving. Salted pretzels and crackers. Pre-packaged breadcrumbs. Bagels, croissants, and biscuits. Meats and other protein foods Ribs and chicken wings. Bacon, ham, pepperoni, bologna, salami, and packaged luncheon meats. Hot dogs, bratwurst, and sausage. Canned meat. Smoked meat and fish. Salted nuts and seeds. Dairy Whole milk, half-and-half, and cream. Buttermilk. Processed cheese, cheese spreads, and cheese curds. Regular cottage cheese. Feta cheese. Shredded cheese. String cheese. Fats and oils Butter, lard, shortening, ghee, and bacon fat. Canned and packaged gravies. Seasoning and other foods Onion salt, garlic salt, table salt, and sea salt. Marinades. Regular salad dressings. Relishes, pickles, and olives. Meat flavorings and tenderizers, and bouillon cubes. Horseradish, ketchup, and mustard. Worcestershire sauce. Teriyaki sauce, soy sauce (including reduced sodium). Hot sauce and Tabasco sauce. Steak sauce, fish sauce, oyster sauce, and cocktail sauce. Taco seasonings. Barbecue sauce. Tartar sauce. The items listed above may not be a complete list of foods and beverages you should avoid. Contact a dietitian for more information. Summary  A heart failure eating plan includes changes that limit your intake of sodium and unhealthy fat, and it may help you lose weight or maintain a healthy weight. Your health care provider may also recommend limiting how much fluid you drink.  Most people with heart failure should eat no more than 2,300 mg of salt (sodium) a day. The amount of sodium that is recommended for you  may be lower, depending on your condition.  Contact your health care provider or dietitian before making any major changes to your diet. This information is not intended to replace advice given to you by your health care provider. Make sure you discuss any questions you have with your health care provider. Document Revised: 04/28/2018 Document Reviewed: 07/17/2016 Elsevier Patient Education  [place].

## 2020-02-06 ENCOUNTER — Other Ambulatory Visit: Payer: Self-pay

## 2020-02-06 MED ORDER — FENOFIBRATE 160 MG PO TABS
160.0000 mg | ORAL_TABLET | Freq: Every day | ORAL | 3 refills | Status: DC
Start: 2020-02-06 — End: 2020-04-24

## 2020-02-13 ENCOUNTER — Ambulatory Visit (INDEPENDENT_AMBULATORY_CARE_PROVIDER_SITE_OTHER): Payer: Medicare HMO

## 2020-02-13 DIAGNOSIS — I639 Cerebral infarction, unspecified: Secondary | ICD-10-CM

## 2020-02-14 ENCOUNTER — Telehealth: Payer: Self-pay

## 2020-02-14 NOTE — Chronic Care Management (AMB) (Signed)
Chronic Care Management Pharmacy Assistant   Name: Earl Mueller  MRN: [id number] DOB: [birth year]  Reason for Encounter: Medication Review  Patient contacted for adherence review for losartan 25 mg. His wife said that he had about 10 tablets left and would be contacting [employer] today for a refill. Patient has extra losartan due to being in the hospital recently. Inquired if he was taking 1/2 tablet as it was prescribed, she stated that she "had be giving him 1 full tablet." She stated that she took his blood pressure this morning and it was 132/80 and pulse was 80.    PCP : Rochel Brome, MD  Allergies:   Allergies  Allergen Reactions  . Other Other (See Comments)    Any narcotic makes him a "wild man"  Delusions (intolerance)    Medications: Outpatient Encounter Medications as of 02/14/2020  Medication Sig Note  . Acetaminophen 500 MG capsule Take 2 capsules (1,000 mg total) by mouth every 6 (six) hours as needed for fever or pain.   Marland Kitchen albuterol (VENTOLIN HFA) 108 (90 Base) MCG/ACT inhaler Inhale 2 puffs into the lungs every 6 (six) hours as needed for wheezing or shortness of breath.   Marland Kitchen aspirin EC 81 MG tablet Take 81 mg by mouth daily.   Marland Kitchen atorvastatin (LIPITOR) 80 MG tablet TAKE 1 TABLET EVERY DAY (Patient taking differently: Take 80 mg by mouth daily. )   . Budeson-Glycopyrrol-Formoterol (BREZTRI AEROSPHERE) 160-9-4.8 MCG/ACT AERO Inhale 2 puffs into the lungs in the morning and at bedtime.   . cilostazol (PLETAL) 100 MG tablet Take 100 mg by mouth 2 (two) times daily. (Patient not taking: Reported on 01/29/2020)   . clopidogrel (PLAVIX) 75 MG tablet TAKE 1 TABLET EVERY DAY (Patient taking differently: Take 75 mg by mouth daily. )   . fenofibrate 160 MG tablet Take 1 tablet (160 mg total) by mouth daily.   . furosemide (LASIX) 20 MG tablet Take 1 tablet (20 mg total) by mouth daily.   Marland Kitchen levothyroxine (SYNTHROID) 112 MCG tablet Take 1 tablet (112 mcg total) by mouth daily.   Marland Kitchen  losartan (COZAAR) 25 MG tablet Take 0.5 tablets (12.5 mg total) by mouth daily.   . metoprolol succinate (TOPROL-XL) 25 MG 24 hr tablet Take 1 tablet (25 mg total) by mouth daily.   . Multiple Vitamins-Minerals (CENTRUM SILVER PO) Take 1 tablet by mouth daily.   . nitroGLYCERIN (NITROSTAT) 0.4 MG SL tablet Place 0.4 mg under the tongue every 5 (five) minutes as needed for chest pain. 12/06/2019: Has on hand at home  . PARoxetine (PAXIL) 20 MG tablet TAKE 1 TABLET EVERY DAY (Patient taking differently: Take 20 mg by mouth daily. )    No facility-administered encounter medications on file as of 02/14/2020.    Current Diagnosis: Patient Active Problem List   Diagnosis Date Noted  . Acquired thrombophilia ([place]) 01/23/2020  . Contusion of left periocular region 01/23/2020  . Acute respiratory failure with hypoxia ([place]) 01/23/2020  . Anxiety   . Arthritis   . CAD (coronary artery disease)   . Chronic combined systolic and diastolic CHF (congestive heart failure) ([place])   . Chronic respiratory failure ([place])   . CKD (chronic kidney disease), stage III ([place])   . COPD (chronic obstructive pulmonary disease) ([place])   . CVA (cerebral vascular accident) ([place])   . Former tobacco use   . Ischemic cardiomyopathy   . Malnutrition of moderate degree 12/07/2019  . CHF exacerbation ([place]) 12/05/2019  .  Acute on chronic combined systolic and diastolic CHF (congestive heart failure) ([place]) 12/05/2019  . Acute on chronic respiratory failure with hypoxia ([place]) 12/05/2019  . Leukocytosis 12/05/2019  . Need for immunization against influenza 12/02/2019  . Medicare annual wellness visit, subsequent 12/02/2019  . Chronic respiratory failure with hypoxia ([place]) 10/29/2019  . Hypertensive heart disease without heart failure 10/29/2019  . Hemiparesis, speech and language deficits, and cognitive deficits due to recent cerebral infarction ([place]) 09/25/2019  . 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 Cardiomyopathy, ischemic 09/25/2019  . Hypothyroidism 09/25/2019  . Depression 09/25/2019  . Hypokalemia 09/25/2019  . Pulmonary emphysema ([place]) 09/25/2019  . Aortic atherosclerosis ([place]) 09/25/2019  . Unstable angina ([place])   . Ischemic heart disease 09/13/2019  . Exertional dyspnea 09/13/2019  . Carotid artery stenosis 03/26/2017  . Symptomatic carotid artery stenosis 03/05/2017  . Carotid bruit 11/25/2016  . Coronary artery disease involving native coronary artery of native heart without angina pectoris 11/20/2014  . Dyslipidemia 11/20/2014  . Essential hypertension 11/20/2014  . Peripheral vascular disease ([place]) 11/20/2014    Goals Addressed   None     Follow-Up:  Pharmacist Review

## 2020-02-15 LAB — CUP PACEART REMOTE DEVICE CHECK
Date Time Interrogation Session: 20211201145848
Implantable Pulse Generator Implant Date: 20210712

## 2020-02-16 DIAGNOSIS — J449 Chronic obstructive pulmonary disease, unspecified: Secondary | ICD-10-CM | POA: Diagnosis not present

## 2020-02-20 DIAGNOSIS — H2511 Age-related nuclear cataract, right eye: Secondary | ICD-10-CM | POA: Diagnosis not present

## 2020-02-20 NOTE — Progress Notes (Signed)
Carelink Summary Report / Loop Recorder 

## 2020-02-23 ENCOUNTER — Other Ambulatory Visit: Payer: Self-pay

## 2020-02-23 MED ORDER — ATORVASTATIN CALCIUM 80 MG PO TABS: 80.0000 mg | ORAL_TABLET | Freq: Every day | ORAL | 1 refills | Status: DC

## 2020-03-18 ENCOUNTER — Ambulatory Visit (INDEPENDENT_AMBULATORY_CARE_PROVIDER_SITE_OTHER): Payer: Medicare HMO

## 2020-03-18 DIAGNOSIS — I255 Ischemic cardiomyopathy: Secondary | ICD-10-CM | POA: Diagnosis not present

## 2020-03-18 DIAGNOSIS — J449 Chronic obstructive pulmonary disease, unspecified: Secondary | ICD-10-CM | POA: Diagnosis not present

## 2020-03-19 LAB — CUP PACEART REMOTE DEVICE CHECK
Date Time Interrogation Session: 20220103145929
Implantable Pulse Generator Implant Date: 20210712

## 2020-03-21 ENCOUNTER — Other Ambulatory Visit: Payer: Self-pay

## 2020-03-21 ENCOUNTER — Encounter: Payer: Self-pay | Admitting: Cardiology

## 2020-03-21 ENCOUNTER — Ambulatory Visit: Payer: Medicare HMO | Admitting: Cardiology

## 2020-03-21 VITALS — BP 126/70 | HR 68 | Ht 70.5 in | Wt 132.2 lb

## 2020-03-21 DIAGNOSIS — E785 Hyperlipidemia, unspecified: Secondary | ICD-10-CM | POA: Diagnosis not present

## 2020-03-21 DIAGNOSIS — I2510 Atherosclerotic heart disease of native coronary artery without angina pectoris: Secondary | ICD-10-CM | POA: Diagnosis not present

## 2020-03-21 DIAGNOSIS — I5042 Chronic combined systolic (congestive) and diastolic (congestive) heart failure: Secondary | ICD-10-CM | POA: Diagnosis not present

## 2020-03-21 DIAGNOSIS — Z87891 Personal history of nicotine dependence: Secondary | ICD-10-CM

## 2020-03-21 DIAGNOSIS — N183 Chronic kidney disease, stage 3 unspecified: Secondary | ICD-10-CM

## 2020-03-21 NOTE — Progress Notes (Signed)
Cardiology Office Note:    Date:  03/21/2020   ID:  Earl Mueller, DOB [birth year], MRN [id number]  PCP:  Rochel Brome, MD  Cardiologist:  Jenean Lindau, MD   Referring MD: Rochel Brome, MD    ASSESSMENT:    1. Coronary artery disease involving native coronary artery of native heart without angina pectoris   2. Chronic combined systolic and diastolic CHF (congestive heart failure) (HCC)   3. Stage 3 chronic kidney disease, unspecified whether stage 3a or 3b CKD ([place])   4. Dyslipidemia   5. Former tobacco use    PLAN:    In order of problems listed above:  1. Coronary artery disease and congestive heart failure: I discussed my findings with the patient at extensive length.  Patient has lost some weight since last evaluation and I congratulated him about it.  He is happy about this.  He is ambulating to the best of his ability.  He uses a walker to ambulate.  Diet including salt intake issues were discussed 2. COPD on oxygen: Importance of compliance urged and he understands. 3. Essential hypertension: Blood pressure stable and diet was emphasized. 4. Mixed dyslipidemia: Lipids are fine I reviewed his lipids from [facility] sheet and discussed with the patient at length. 5. Patient will be seen in follow-up appointment in 4 months or earlier if the patient has any concerns 6.    Medication Adjustments/Labs and Tests Ordered: Current medicines are reviewed at length with the patient today.  Concerns regarding medicines are outlined above.  No orders of the defined types were placed in this encounter.  No orders of the defined types were placed in this encounter.    No chief complaint on file.    History of Present Illness:    Earl Mueller is a 85 y.o. male.  Patient has past medical history of congestive heart failure, coronary artery disease, atherosclerotic vascular disease history of stroke COPD essential hypertension and dyslipidemia.  He denies any problems at this time and  takes care of activities of daily living.  No chest pain orthopnea or PND.  He is doing very well managing his weight and his wife is very supportive and she accompanies him for this visit.  At the time of my evaluation, the patient is alert awake oriented and in no distress.  Past Medical History:  Diagnosis Date  . Acquired thrombophilia ([place]) 01/23/2020  . Acute on chronic combined systolic and diastolic CHF (congestive heart failure) ([place]) 12/05/2019  . Acute on chronic respiratory failure with hypoxia ([place]) 12/05/2019  . Acute respiratory failure with hypoxia ([place]) 01/23/2020  . Anxiety   . Aortic atherosclerosis ([place]) 09/25/2019  . Arthritis   . CAD (coronary artery disease)    a.  s/p DES to LAD/LCx in 09/2019.  . Cardiomyopathy, ischemic 09/25/2019  . Carotid artery stenosis    a. s/p L CEA 2019. b. 09/2019: Per neurology's note, his long term BP goal is 130-150 with high grade stenosis bilateral ICA siphon and right petrous ICA.  Marland Kitchen Carotid bruit 11/25/2016  . Cerebral embolism with cerebral infarction - multifocal, s/p tPA, source unknown  09/25/2019  . CHF exacerbation ([place]) 12/05/2019  . Chronic combined systolic and diastolic CHF (congestive heart failure) ([place])   . Chronic respiratory failure ([place])   . Chronic respiratory failure with hypoxia ([place]) 10/29/2019  . CKD (chronic kidney disease), stage III ([place])   . Contusion of left periocular region 01/23/2020  . COPD (chronic obstructive pulmonary  disease) ([place])   . Coronary artery disease involving native coronary artery of native heart without angina pectoris 11/20/2014  . CVA (cerebral vascular accident) ([place])   . Depression 09/25/2019  . Dyslipidemia 11/20/2014  . Essential hypertension   . Exertional dyspnea 09/13/2019  . Former tobacco use   . Hemiparesis, speech and language deficits, and cognitive deficits due to recent cerebral infarction ([place]) 09/25/2019  . Hypertensive heart disease without heart failure 10/29/2019  . Hypokalemia  09/25/2019  . Hypothyroidism   . Ischemic cardiomyopathy   . Ischemic heart disease 09/13/2019  . Leukocytosis 12/05/2019  . Malnutrition of moderate degree 12/07/2019  . Medicare annual wellness visit, subsequent 12/02/2019  . Need for immunization against influenza 12/02/2019  . Peripheral vascular disease ([place])    a. s/p aortobifem [facility] 2007.  . Pulmonary emphysema ([place]) 09/25/2019  . Symptomatic carotid artery stenosis 03/05/2017  . Unstable angina [facility])     Past Surgical History:  Procedure Laterality Date  . BACK SURGERY    . CARDIAC CATHETERIZATION  03/06/10   NL-LM, 40%pLAD, 30% mid/distal LAD, 98% ostial DIAG (small), 30%pCx, 50%mCx, 30%dCx, 30% OM2, 40%dRCA; Med RX rec ([facility])  . caritid endart    . CORONARY ATHERECTOMY N/A 09/14/2019   Procedure: CORONARY ATHERECTOMY;  Surgeon: Martinique, Peter M, MD;  Location: [place] [facility];  Service: Cardiovascular;  Laterality: N/A;  . CORONARY ATHERECTOMY N/A 09/15/2019   Procedure: CORONARY ATHERECTOMY;  Surgeon: Martinique, Peter M, MD;  Location: [place] [facility];  Service: Cardiovascular;  Laterality: N/A;  . CORONARY STENT INTERVENTION N/A 09/14/2019   Procedure: CORONARY STENT INTERVENTION;  Surgeon: Martinique, Peter M, MD;  Location: [place] [facility];  Service: Cardiovascular;  Laterality: N/A;  . CORONARY STENT INTERVENTION N/A 09/15/2019   Procedure: CORONARY STENT INTERVENTION;  Surgeon: Martinique, Peter M, MD;  Location: [place] [facility];  Service: Cardiovascular;  Laterality: N/A;  . ENDARTERECTOMY Left 03/26/2017   Procedure: ENDARTERECTOMY CAROTID LEFT;  Surgeon: Rosetta Posner, MD;  Location: [facility] OR;  Service: Vascular;  Laterality: Left;  . femerao bypass    . LEFT HEART CATH AND CORONARY ANGIOGRAPHY N/A 02/03/2017   Procedure: LEFT HEART CATH AND CORONARY ANGIOGRAPHY;  Surgeon: Martinique, Peter M, MD;  Location: [place] [facility];  Service: Cardiovascular;  Laterality: N/A;  . LEFT HEART CATH AND CORONARY ANGIOGRAPHY N/A 09/14/2019    Procedure: LEFT HEART CATH AND CORONARY ANGIOGRAPHY;  Surgeon: Martinique, Peter M, MD;  Location: [place] [facility];  Service: Cardiovascular;  Laterality: N/A;  . LOOP RECORDER INSERTION N/A 09/25/2019   Procedure: LOOP RECORDER INSERTION;  Surgeon: Deboraha Sprang, MD;  Location: [place] [facility];  Service: Cardiovascular;  Laterality: N/A;  . PATCH ANGIOPLASTY Left 03/26/2017   Procedure: PATCH ANGIOPLASTY LEFT CAROTID ARTERY;  Surgeon: Rosetta Posner, MD;  Location: [place];  Service: Vascular;  Laterality: Left;  . SCALP LACERATION REPAIR N/A 05/23/2013   Procedure: EXCISION SCALP ULCER DEBRIDEMENT OF SKIN AND BONE WITH PLACEMENT OF A-CELL;  Surgeon: Irene Limbo, MD;  Location: [facility];  Service: Plastics;  Laterality: N/A;  . STOMACH SURGERY     x2  . VASCULAR SURGERY     AFBG 10/15/05    Current Medications: Current Meds  Medication Sig  . aspirin EC 81 MG tablet Take 81 mg by mouth daily.  Marland Kitchen atorvastatin (LIPITOR) 80 MG tablet Take 1 tablet (80 mg total) by mouth daily.  . Budeson-Glycopyrrol-Formoterol (BREZTRI AEROSPHERE) 160-9-4.8 MCG/ACT AERO Inhale 2  puffs into the lungs in the morning and at bedtime.  . cilostazol (PLETAL) 100 MG tablet Take 100 mg by mouth 2 (two) times daily.  . clopidogrel (PLAVIX) 75 MG tablet TAKE 1 TABLET EVERY DAY  . fenofibrate 160 MG tablet Take 1 tablet (160 mg total) by mouth daily.  . furosemide (LASIX) 20 MG tablet Take 1 tablet (20 mg total) by mouth daily.  Marland Kitchen levothyroxine (SYNTHROID) 112 MCG tablet Take 1 tablet (112 mcg total) by mouth daily.  Marland Kitchen losartan (COZAAR) 25 MG tablet Take 0.5 tablets (12.5 mg total) by mouth daily.  . metoprolol succinate (TOPROL-XL) 25 MG 24 hr tablet Take 1 tablet (25 mg total) by mouth daily.  . Multiple Vitamins-Minerals (CENTRUM SILVER PO) Take 1 tablet by mouth daily.  . nitroGLYCERIN (NITROSTAT) 0.4 MG SL tablet Place 0.4 mg under the tongue every 5 (five) minutes as needed for chest pain.  Marland Kitchen PARoxetine (PAXIL)  20 MG tablet TAKE 1 TABLET EVERY DAY     Allergies:   Other   Social History   Socioeconomic History  . Marital status: Married    Spouse name: Not on file  . Number of children: Not on file  . Years of education: Not on file  . Highest education level: Not on file  Occupational History  . Not on file  Tobacco Use  . Smoking status: Former Smoker    Quit date: 12/18/2007    Years since quitting: 12.2  . Smokeless tobacco: Never Used  Vaping Use  . Vaping Use: Never used  Substance and Sexual Activity  . Alcohol use: Yes    Comment: occ  . Drug use: No  . Sexual activity: Not on file  Other Topics Concern  . Not on file  Social History Narrative  . Not on file   Social Determinants of Health   Financial Resource Strain: Not on file  Food Insecurity: No Food Insecurity  . Worried About Programme researcher, broadcasting/film/video in the Last Year: Never true  . Ran Out of Food in the Last Year: Never true  Transportation Needs: No Transportation Needs  . Lack of Transportation (Medical): No  . Lack of Transportation (Non-Medical): No  Physical Activity: Inactive  . Days of Exercise per Week: 0 days  . Minutes of Exercise per Session: 0 min  Stress: Not on file  Social Connections: Not on file     Family History: The patient's family history includes Heart failure in his father.  ROS:   Please see the history of present illness.    All other systems reviewed and are negative.  EKGs/Labs/Other Studies Reviewed:    The following studies were reviewed today: IMPRESSIONS    1. Left ventricular ejection fraction, by estimation, is 35 to 40%. The  left ventricle has moderately decreased function. The left ventricle  demonstrates regional wall motion abnormalities (see scoring  diagram/findings for description). Left ventricular  diastolic parameters are consistent with Grade I diastolic dysfunction  (impaired relaxation). There is mild hypokinesis of the left ventricular,  entire  inferolateral wall.  2. Right ventricular systolic function is normal. The right ventricular  size is normal. There is normal pulmonary artery systolic pressure.  3. Left atrial size was mildly dilated.  4. The mitral valve is abnormal. Trivial mitral valve regurgitation. No  evidence of mitral stenosis.  5. The aortic valve is abnormal. There is mild calcification of the  aortic valve. Aortic valve regurgitation is trivial. Mild aortic valve  sclerosis  is present, with no evidence of aortic valve stenosis.  6. The inferior vena cava is normal in size with greater than 50%  respiratory variability, suggesting right atrial pressure of 3 mmHg.    Recent Labs: 12/05/2019: B Natriuretic Peptide 1,176.6 12/06/2019: Magnesium 2.0; TSH 1.728 01/18/2020: ALT 19; BUN 22; Creatinine, Ser 1.46; Hemoglobin 16.1; Platelets 358; Potassium 5.1; Sodium 139  Recent Lipid Panel    Component Value Date/Time   CHOL 146 01/18/2020 1133   TRIG 87 01/18/2020 1133   HDL 54 01/18/2020 1133   CHOLHDL 2.7 01/18/2020 1133   CHOLHDL 2.4 09/23/2019 0257   VLDL 10 09/23/2019 0257   LDLCALC 76 01/18/2020 1133    Physical Exam:    VS:  BP 126/70   Pulse 68   Ht 5' 10.5" (1.791 m)   Wt 132 lb 3.2 oz (60 kg)   SpO2 98%   BMI 18.70 kg/m     Wt Readings from Last 3 Encounters:  03/21/20 132 lb 3.2 oz (60 kg)  01/23/20 138 lb (62.6 kg)  01/19/20 137 lb 12.8 oz (62.5 kg)     GEN: Patient is in no acute distress HEENT: Normal NECK: No JVD; No carotid bruits LYMPHATICS: No lymphadenopathy CARDIAC: Hear sounds regular, 2/6 systolic murmur at the apex. RESPIRATORY:  Clear to auscultation without rales, wheezing or rhonchi  ABDOMEN: Soft, non-tender, non-distended MUSCULOSKELETAL:  No edema; No deformity  SKIN: Warm and dry NEUROLOGIC:  Alert and oriented x 3 PSYCHIATRIC:  Normal affect   Signed, Jenean Lindau, MD  03/21/2020 1:04 PM    [facility]

## 2020-03-21 NOTE — Patient Instructions (Addendum)

## 2020-03-21 NOTE — Progress Notes (Signed)
30 day holter

## 2020-03-22 ENCOUNTER — Other Ambulatory Visit: Payer: Self-pay | Admitting: Family Medicine

## 2020-03-27 ENCOUNTER — Telehealth: Payer: Medicare HMO

## 2020-03-27 NOTE — Chronic Care Management (AMB) (Incomplete)
Chronic Care Management Pharmacy  Name: Earl Mueller  MRN: [id number] DOB: December 03, 1935  Chief Complaint/ HPI  Earl Mueller,  85 y.o. , male presents for their Follow-Up CCM visit with the clinical pharmacist via telephone due to COVID-19 Pandemic.  PCP : Earl Ohara, MD  Their chronic conditions include: HTN, Peripheral vascular disease, Carotid artery stenosis, CAD, Hx of stroke, HLD, anxiety, arthritis, hypothyroidism.  Office Visits: 01/23/2020 - continue current medicaitons.  Consult Visit: 03/21/2020 - cardiology - patient has lost weight. Compliance with oxygen encouraged.   Medications: Outpatient Encounter Medications as of 03/27/2020  Medication Sig  . Acetaminophen 500 MG capsule Take 2 capsules (1,000 mg total) by mouth every 6 (six) hours as needed for fever or pain.  Marland Kitchen aspirin EC 81 MG tablet Take 81 mg by mouth daily.  Marland Kitchen atorvastatin (LIPITOR) 80 MG tablet Take 1 tablet (80 mg total) by mouth daily.  . Budeson-Glycopyrrol-Formoterol (BREZTRI AEROSPHERE) 160-9-4.8 MCG/ACT AERO Inhale 2 puffs into the lungs in the morning and at bedtime.  . cilostazol (PLETAL) 100 MG tablet Take 100 mg by mouth 2 (two) times daily.  . clopidogrel (PLAVIX) 75 MG tablet TAKE 1 TABLET EVERY DAY  . fenofibrate 160 MG tablet Take 1 tablet (160 mg total) by mouth daily.  . furosemide (LASIX) 20 MG tablet Take 1 tablet (20 mg total) by mouth daily.  Marland Kitchen levothyroxine (SYNTHROID) 112 MCG tablet TAKE 1 TABLET EVERY DAY  . losartan (COZAAR) 25 MG tablet Take 0.5 tablets (12.5 mg total) by mouth daily.  . metoprolol succinate (TOPROL-XL) 25 MG 24 hr tablet Take 1 tablet (25 mg total) by mouth daily.  . Multiple Vitamins-Minerals (CENTRUM SILVER PO) Take 1 tablet by mouth daily.  . nitroGLYCERIN (NITROSTAT) 0.4 MG SL tablet Place 0.4 mg under the tongue every 5 (five) minutes as needed for chest pain.  Marland Kitchen PARoxetine (PAXIL) 20 MG tablet TAKE 1 TABLET EVERY DAY   No facility-administered encounter  medications on file as of 03/27/2020.     Current Diagnosis/Assessment:  Goals Addressed   None    COPD / Asthma / Tobacco   Eosinophil count:   Lab Results  Component Value Date/Time   EOSPCT 3 12/06/2019 12:30 AM  %                               Eos (Absolute):  Lab Results  Component Value Date/Time   EOSABS 0.5 (H) 01/18/2020 11:33 AM    Tobacco Status:  Social History   Tobacco Use  Smoking Status Former Smoker  . Quit date: 12/18/2007  . Years since quitting: 12.2  Smokeless Tobacco Never Used    Patient has failed these meds in past: none reported Patient is currently controlled on the following medications:   Albuterol 2 puffs every 6 hours prn  Breztri 2 puffs twice daily  Using maintenance inhaler regularly? Yes  We discussed:  proper inhaler technique. Patient is still on oxygen. Pharmacist submitted application for [facility] patient assistance and will coordinate renewal for 2022. Patient to enroll in pulmonary rehab once recovered from recent fall.   Plan  Continue current medications   Hyperlipidemia/CAD   Lipid Panel     Component Value Date/Time   CHOL 146 01/18/2020 1133   TRIG 87 01/18/2020 1133   HDL 54 01/18/2020 1133   CHOLHDL 2.7 01/18/2020 1133   CHOLHDL 2.4 09/23/2019 0257   VLDL 10 09/23/2019 0257  [place] 76 01/18/2020 1133   LABVLDL 16 01/18/2020 1133     The ASCVD Risk score Mikey Bussing DC Jr., et al., 2013) failed to calculate for the following reasons:   The 2013 ASCVD risk score is only valid for ages 5 to 29   The patient has a prior MI or stroke diagnosis   Patient has failed these meds in past: zetia, cilostazole Patient is currently controlled on the following medications:   aspirin ec 81 mg daily  atorvastatin 80 mg daily  fenofibrate 160 mg daily  nitroglycerin 0.4 mg prn  clopidogrel 75 mg daily  We discussed:  diet and exercise extensively. Patient is unable to exercise. Patient is eating lower sodium diet  lately and wife is preparing all his food. Patient is supposed to begin pulmonary rehab once recovered from recent fall.    Plan  Continue current medications   Heart Failure   BP Readings from Last 3 Encounters:  01/23/20 128/68  01/19/20 104/60  01/15/20 106/70   Lab Results  Component Value Date   CREATININE 1.46 (H) 01/18/2020   BUN 22 01/18/2020   GFRNONAA 44 (L) 01/18/2020   GFRAA 51 (L) 01/18/2020   NA 139 01/18/2020   K 5.1 01/18/2020   CALCIUM 10.2 01/18/2020   CO2 22 01/18/2020   Type: Combined Systolic and Diastolic  Last ejection fraction: 35-40%  Patient has failed these meds in past: none reported Patient is currently controlled on the following medications:   Furosemide 20 mg daily  Losartan 12.5 mg daily  Metoprolol Succinate 25 mg daily   We discussed diet and exercise extensively.   Patient's wife is helping a lot with diet. Goal <1500 mg/day sodium. She ordered a cookbook on [place] that is helping her make his own salad dressings and soups to reduce sodium. Wife reports that is burdensome but seems to be helping. Discussed options of making a larger batch of soups and freezing in individual portions. Wife will try this and is thankful for recommendation.   Diet: Breakfast - [place] bacon, eggs, toast, hashbrowns. Lunch/supper soup or meat and vegetables.   Exercise: Wife ordered DVD of chair exercises for them to complete. Patient is also supposed to be enrolling in pulmonary rehab once healed.   Patient is not driving currently and uncertain when he can resume it.   Plan  Continue current medications   Hypothyroidism   TSH  Date Value Ref Range Status  12/06/2019 1.728 0.350 - 4.500 uIU/mL Final    Comment:    Performed by a 3rd Generation assay with a functional sensitivity of <=0.01 uIU/mL. Performed at [facility], [place] [address] [place], [place] [address]      Patient has failed these meds in past: n/a Patient is  currently uncontrolled on the following medications:   levothyroxine 112 mcg daily  We discussed: Wife reports good compliance with regimen.   Plan  Continue current medications   Vaccines   Reviewed and discussed patient's vaccination history.  Patient's last TDAP was 11/17. Penumovax 23 in 2010.  Patient has had both COVID vaccines. Has had first Shingrix shot and second one will be in July.   Immunization History  Administered Date(s) Administered  . Fluad Quad(high Dose 65+) 11/30/2019  . Influenza-Unspecified 12/18/2016  . PFIZER SARS-COV-2 Vaccination 04/01/2019, 04/22/2019, 01/09/2020  . Pneumococcal Polysaccharide-23 10/25/2008, 09/15/2019  . Tdap 01/15/2016  . Zoster Recombinat (Shingrix) 06/12/2019    Plan  Recommended patient receive annual flu vaccine in office.  Medication Management   Pt uses [facility] mail order pharmacy for all medications Uses pill box? Yes  We discussed: Patient is doing well taking medication as prescribed and wife fills his pill box.   Plan  Continue current medication management strategy    Follow up: 2 month phone visit

## 2020-03-28 DIAGNOSIS — H5213 Myopia, bilateral: Secondary | ICD-10-CM | POA: Diagnosis not present

## 2020-03-28 DIAGNOSIS — H52209 Unspecified astigmatism, unspecified eye: Secondary | ICD-10-CM | POA: Diagnosis not present

## 2020-03-28 DIAGNOSIS — H524 Presbyopia: Secondary | ICD-10-CM | POA: Diagnosis not present

## 2020-04-02 NOTE — Progress Notes (Signed)
Carelink Summary Report / Loop Recorder 

## 2020-04-17 ENCOUNTER — Ambulatory Visit (INDEPENDENT_AMBULATORY_CARE_PROVIDER_SITE_OTHER): Payer: Medicare HMO

## 2020-04-17 DIAGNOSIS — I639 Cerebral infarction, unspecified: Secondary | ICD-10-CM

## 2020-04-18 DIAGNOSIS — J449 Chronic obstructive pulmonary disease, unspecified: Secondary | ICD-10-CM | POA: Diagnosis not present

## 2020-04-22 ENCOUNTER — Telehealth: Payer: Medicare HMO

## 2020-04-22 LAB — CUP PACEART REMOTE DEVICE CHECK
Date Time Interrogation Session: 20220205145616
Implantable Pulse Generator Implant Date: 20210712

## 2020-04-22 NOTE — Progress Notes (Signed)
Carelink Summary Report / Loop Recorder 

## 2020-04-23 ENCOUNTER — Other Ambulatory Visit: Payer: Self-pay | Admitting: Family Medicine

## 2020-04-23 DIAGNOSIS — I119 Hypertensive heart disease without heart failure: Secondary | ICD-10-CM

## 2020-04-29 ENCOUNTER — Telehealth: Payer: Medicare HMO

## 2020-04-30 ENCOUNTER — Telehealth: Payer: Medicare HMO

## 2020-04-30 NOTE — Progress Notes (Deleted)
Chronic Care Management Pharmacy Note  04/30/2020 Name:  KASTEN LEVEQUE MRN:  [id number] DOB:  1935-05-11  Subjective: Earl Mueller is an 85 y.o. year old male who is a primary patient of Cox, Kirsten, MD.  The CCM team was consulted for assistance with disease management and care coordination needs.    Engaged with patient by telephone for follow up visit in response to provider referral for pharmacy case management and/or care coordination services.   Consent to Services:  The patient was given information about Chronic Care Management services, agreed to services, and gave verbal consent prior to initiation of services.  Please see initial visit note for detailed documentation.   Patient Care Team: Rochel Brome, MD as PCP - General (Family Medicine) Revankar, Reita Cliche, MD as PCP - Cardiology (Cardiology) Deboraha Sprang, MD as PCP - Electrophysiology (Cardiology) Revankar, Reita Cliche, MD as Consulting Physician (Cardiology) Burnice Logan, [facility] as Pharmacist (Pharmacist)  Recent office visits: ***  Recent consult visits: 03/21/2020 - Emphasized diet and weight loss.   Hospital visits: {Hospital DC Yes/No:25215}  Objective:  Lab Results  Component Value Date   CREATININE 1.46 (H) 01/18/2020   BUN 22 01/18/2020   GFRNONAA 44 (L) 01/18/2020   GFRAA 51 (L) 01/18/2020   NA 139 01/18/2020   K 5.1 01/18/2020   CALCIUM 10.2 01/18/2020   CO2 22 01/18/2020    Lab Results  Component Value Date/Time   HGBA1C 5.6 09/23/2019 02:57 AM    Last diabetic Eye exam: No results found for: HMDIABEYEEXA  Last diabetic Foot exam: No results found for: HMDIABFOOTEX   Lab Results  Component Value Date   CHOL 146 01/18/2020   HDL 54 01/18/2020   LDLCALC 76 01/18/2020   TRIG 87 01/18/2020   CHOLHDL 2.7 01/18/2020    Hepatic Function Latest Ref Rng & Units 01/18/2020 12/06/2019 10/16/2019  Total Protein 6.0 - 8.5 g/dL 7.6 7.5 6.9  Albumin 3.6 - 4.6 g/dL 4.3 3.6 4.0  AST 0 - 40 IU/L _0 ALT 0 - 44 IU/L _1 Alk Phosphatase 44 - 121 IU/L 66 58 75  Total Bilirubin 0.0 - 1.2 mg/dL 0.6 1.3(H) 0.7  Bilirubin, Direct 0.00 - 0.40 mg/dL - - -    Lab Results  Component Value Date/Time   TSH 1.728 12/06/2019 12:30 AM   TSH 1.870 10/16/2019 08:34 AM   TSH 5.240 (H) 08/22/2019 09:06 AM    CBC Latest Ref Rng & Units 01/18/2020 12/08/2019 12/07/2019  WBC 3.4 - 10.8 x10E3/uL 7.2 7.9 7.9  Hemoglobin 13.0 - 17.7 g/dL 16.1 16.2 16.1  Hematocrit 37.5 - 51.0 % 47.9 50.1 49.9  Platelets 150 - 450 x10E3/uL 358 341 327    No results found for: VD25OH  Clinical ASCVD: {YES/NO:21197} The ASCVD Risk score Mikey Bussing DC Jr., et al., 2013) failed to calculate for the following reasons:   The 2013 ASCVD risk score is only valid for ages 66 to 71   The patient has a prior MI or stroke diagnosis    Depression screen [facility] 2/9 11/30/2019 08/07/2019  Decreased Interest 0 0  Down, Depressed, Hopeless 0 0  PHQ - 2 Score 0 0     ***Other: (CHADS2VASc if Afib, MMRC or CAT for COPD, ACT, DEXA)  Social History   Tobacco Use  Smoking Status Former Smoker  . Quit date: 12/18/2007  . Years since quitting: 12.3  Smokeless Tobacco Never Used   BP Readings from  Last 3 Encounters:  03/21/20 126/70  01/23/20 128/68  01/19/20 104/60   Pulse Readings from Last 3 Encounters:  03/21/20 68  01/23/20 68  01/19/20 64   Wt Readings from Last 3 Encounters:  03/21/20 132 lb 3.2 oz (60 kg)  01/23/20 138 lb (62.6 kg)  01/19/20 137 lb 12.8 oz (62.5 kg)    Assessment/Interventions: Review of patient past medical history, allergies, medications, health status, including review of consultants reports, laboratory and other test data, was performed as part of comprehensive evaluation and provision of chronic care management services.   SDOH:  (Social Determinants of Health) assessments and interventions performed: {yes/no:20286}   CCM Care Plan  Allergies  Allergen Reactions  . Other Other (See  Comments)    Any narcotic makes him a "wild man"  Delusions (intolerance)    Medications Reviewed Today    Reviewed by Lowella Grip, CMA (Certified Medical Assistant) on 03/21/20 at 1242  Med List Status: <None>  Medication Order Taking? Sig Documenting Provider Last Dose Status Informant  Acetaminophen 500 MG capsule [id number]  Take 2 capsules (1,000 mg total) by mouth every 6 (six) hours as needed for fever or pain. Donzetta Starch, NP  Active Spouse/Significant Other  albuterol (VENTOLIN HFA) 108 (90 Base) MCG/ACT inhaler [id number]  Inhale 2 puffs into the lungs every 6 (six) hours as needed for wheezing or shortness of breath. Collene Gobble, MD  Active   aspirin EC 81 MG tablet [id number]  Take 81 mg by mouth daily. [provider]  Active Spouse/Significant Other  atorvastatin (LIPITOR) 80 MG tablet [id number]  Take 1 tablet (80 mg total) by mouth daily. Cox, Kirsten, MD  Active   Budeson-Glycopyrrol-Formoterol (BREZTRI AEROSPHERE) 160-9-4.8 MCG/ACT Hollie Salk [id number]  Inhale 2 puffs into the lungs in the morning and at bedtime. Collene Gobble, MD  Active   cilostazol (PLETAL) 100 MG tablet [id number]  Take 100 mg by mouth 2 (two) times daily.  Patient not taking: Reported on 01/29/2020   [provider]  Active Spouse/Significant Other  clopidogrel (PLAVIX) 75 MG tablet [id number]  TAKE 1 TABLET EVERY DAY  Patient taking differently: Take 75 mg by mouth daily.    Cox, Kirsten, MD  Active Spouse/Significant Other  fenofibrate 160 MG tablet [id number]  Take 1 tablet (160 mg total) by mouth daily. Cox, Kirsten, MD  Active   furosemide (LASIX) 20 MG tablet [id number]  Take 1 tablet (20 mg total) by mouth daily. Charlynne Cousins, MD  Active   levothyroxine (SYNTHROID) 112 MCG tablet [id number]  Take 1 tablet (112 mcg total) by mouth daily. Cox, Kirsten, MD  Active   losartan (COZAAR) 25 MG tablet [id number]  Take 0.5 tablets (12.5 mg total) by mouth daily. Cox, Kirsten, MD   Active   metoprolol succinate (TOPROL-XL) 25 MG 24 hr tablet [id number]  Take 1 tablet (25 mg total) by mouth daily. Cox, Kirsten, MD  Active   Multiple Vitamins-Minerals (CENTRUM SILVER PO) [id number]  Take 1 tablet by mouth daily. [provider]  Active Spouse/Significant Other  nitroGLYCERIN (NITROSTAT) 0.4 MG SL tablet [id number]  Place 0.4 mg under the tongue every 5 (five) minutes as needed for chest pain. [provider]  Active Spouse/Significant Other           Med Note Alesia Banda, CHASIE F   Wed Dec 06, 2019  7:53 AM) Has on hand at home  PARoxetine (PAXIL) 20 MG tablet [id number]  TAKE 1 TABLET EVERY DAY  Patient  taking differently: Take 20 mg by mouth daily.    CoxElnita Maxwell, MD  Active Spouse/Significant Other          Patient Active Problem List   Diagnosis Date Noted  . Acquired thrombophilia ([place]) 01/23/2020  . Contusion of left periocular region 01/23/2020  . Acute respiratory failure with hypoxia ([place]) 01/23/2020  . Anxiety   . Arthritis   . CAD (coronary artery disease)   . Chronic combined systolic and diastolic CHF (congestive heart failure) ([place])   . Chronic respiratory failure ([place])   . CKD (chronic kidney disease), stage III ([place])   . COPD (chronic obstructive pulmonary disease) ([place])   . CVA (cerebral vascular accident) ([place])   . Former tobacco use   . Ischemic cardiomyopathy   . Malnutrition of moderate degree 12/07/2019  . CHF exacerbation ([place]) 12/05/2019  . Acute on chronic combined systolic and diastolic CHF (congestive heart failure) ([place]) 12/05/2019  . Acute on chronic respiratory failure with hypoxia ([place]) 12/05/2019  . Leukocytosis 12/05/2019  . Need for immunization against influenza 12/02/2019  . Medicare annual wellness visit, subsequent 12/02/2019  . Chronic respiratory failure with hypoxia ([place]) 10/29/2019  . Hypertensive heart disease without heart failure 10/29/2019  . Hemiparesis, speech and language deficits, and cognitive  deficits due to recent cerebral infarction ([place]) 09/25/2019  . Cerebral embolism with cerebral infarction - multifocal, s/p tPA, source unknown  09/25/2019  . Cardiomyopathy, ischemic 09/25/2019  . Hypothyroidism 09/25/2019  . Depression 09/25/2019  . Hypokalemia 09/25/2019  . Pulmonary emphysema ([place]) 09/25/2019  . Aortic atherosclerosis ([place]) 09/25/2019  . Unstable angina ([place])   . Ischemic heart disease 09/13/2019  . Exertional dyspnea 09/13/2019  . Carotid artery stenosis 03/26/2017  . Symptomatic carotid artery stenosis 03/05/2017  . Carotid bruit 11/25/2016  . Coronary artery disease involving native coronary artery of native heart without angina pectoris 11/20/2014  . Dyslipidemia 11/20/2014  . Essential hypertension 11/20/2014  . Peripheral vascular disease ([place]) 11/20/2014    Immunization History  Administered Date(s) Administered  . Fluad Quad(high Dose 65+) 11/30/2019  . Influenza-Unspecified 12/18/2016  . PFIZER(Purple Top)SARS-COV-2 Vaccination 04/01/2019, 04/22/2019, 01/09/2020  . Pneumococcal Polysaccharide-23 10/25/2008, 09/15/2019  . Tdap 01/15/2016  . Zoster Recombinat (Shingrix) 06/12/2019    Conditions to be addressed/monitored:  Heart Failure, Coronary Artery Disease, COPD and Hypothyroidism  There are no care plans that you recently modified to display for this patient.    Medication Assistance: {MEDASSISTANCEINFO:25044}  Patient's preferred pharmacy is:  [facility], [address] [place] [place] [address] Phone: [phone] Fax: [phone]  [facility] - [place], [place] [address] Phone: [phone] Fax: [phone]  [facility] [place] [place] - [address] [place] [address] Phone: [phone] Fax: [phone]  Uses pill box? Yes Pt endorses ***% compliance  We discussed:  Current pharmacy is preferred with insurance plan and patient is satisfied with pharmacy services Patient decided to: Continue current medication management strategy  Care Plan and Follow Up Patient Decision:  Patient agrees to Care Plan and Follow-up.  Plan: Telephone follow up appointment with care management team member scheduled for:  ***     Current Barriers:  . Unable to independently afford treatment regimen . ***  Pharmacist Clinical Goal(s):  Marland Kitchen Over the next 90 days, patient will {PHARMACYGOALCHOICES:24921} through collaboration with PharmD and  provider.  . ***  Interventions: . 1:1 collaboration with Rochel Brome, MD regarding development and update of comprehensive plan of care as evidenced by provider attestation and co-signature . Inter-disciplinary care team collaboration (see longitudinal plan of care) . Comprehensive medication review performed; medication list updated in electronic medical record  Hypertension (BP goal {CHL HP UPSTREAM Pharmacist BP [phone]}) -{CHL Controlled/[phone]} -Current treatment: . *** -Medications previously tried: ***  -Current home readings: *** -Current dietary habits: *** -Current exercise habits: *** -{ACTIONS;DENIES/REPORTS:[phone]::"Denies"} hypotensive/hypertensive symptoms -Educated on {CCM BP Counseling:25124} -Counseled to monitor BP at home ***, document, and provide log at future appointments -{CCMPHARMDINTERVENTION:25122}  Hyperlipidemia/CAD: (LDL goal < ***) -{CHL Controlled/[phone]} -Current treatment: . Atorvastatin 80 mg daily  . Fenofibrate 160 mg daily  -Medications previously tried: ***  -Current dietary patterns: *** -Current exercise habits: *** -Educated on {CCM HLD Counseling:25126} -{CCMPHARMDINTERVENTION:25122}  Heart Failure (Goal: control symptoms and prevent exacerbations) {CHL Controlled/[phone]} Type: {type of heart  failure:30421350} -NYHA Class: {CHL HP Upstream Pharm NYHA Class:[phone]} -Ejection fraction: *** (Date: ***) -Current treatment:  ***losartan 25 mg 1/2 tablet daily   Metoprolol succinate 25 mg daily   Furosemide 20 mg daily -Medications previously tried: *** -Current home BP/HR readings: *** -Current dietary habits: *** -Current exercise routine: *** -Educated on {CCM HF Counseling:25125} -{CCMPHARMDINTERVENTION:25122}  COPD (Goal: control symptoms and prevent exacerbations) -{CHL Controlled/[phone]} -Current treatment  . Breztri 2 puffs twice daily *** -Medications previously tried: ***  -Gold Grade: {CHL HP Upstream Pharm COPD Gold YOYOO:[phone]} -Current COPD Classification:  {CHL HP Upstream Pharm COPD [phone]} -MMRC/CAT score: *** -Pulmonary function testing: *** -Exacerbations requiring treatment in last 6 months: *** -Patient {Actions; denies-reports:120008} consistent use of maintenance inhaler -Frequency of rescue inhaler use: *** -Counseled on {CCMINHALERCOUNSELING:25121} -{CCMPHARMDINTERVENTION:25122}  Health Maintenance -Vaccine gaps: *** -Current therapy:  . *** -Educated on {ccm supplement counseling:25128} -{CCM Patient satisfied:25129} -{CCMPHARMDINTERVENTION:25122}   Patient Goals/Self-Care Activities . Over the next *** days, patient will:  - {pharmacypatientgoals:24919}  Follow Up Plan: {CM FOLLOW UP BVPL:68599}

## 2020-05-02 ENCOUNTER — Telehealth: Payer: Medicare HMO | Admitting: Adult Health

## 2020-05-03 ENCOUNTER — Other Ambulatory Visit: Payer: Self-pay

## 2020-05-03 ENCOUNTER — Ambulatory Visit (INDEPENDENT_AMBULATORY_CARE_PROVIDER_SITE_OTHER): Payer: Medicare HMO

## 2020-05-03 DIAGNOSIS — I2510 Atherosclerotic heart disease of native coronary artery without angina pectoris: Secondary | ICD-10-CM

## 2020-05-03 DIAGNOSIS — I5042 Chronic combined systolic (congestive) and diastolic (congestive) heart failure: Secondary | ICD-10-CM

## 2020-05-03 DIAGNOSIS — J439 Emphysema, unspecified: Secondary | ICD-10-CM

## 2020-05-03 DIAGNOSIS — I119 Hypertensive heart disease without heart failure: Secondary | ICD-10-CM | POA: Diagnosis not present

## 2020-05-03 NOTE — Progress Notes (Signed)
Chronic Care Management Pharmacy Note  05/13/2020 Name:  Earl Mueller MRN:  [id number] DOB:  1936-01-25   Plan Updates:   Patient is not interested in cardiac pulumonary rehab despite wife's preference.   Subjective: Earl Mueller is an 85 y.o. year old male who is a primary patient of Cox, Kirsten, MD.  The CCM team was consulted for assistance with disease management and care coordination needs.    Engaged with patient by telephone for follow up visit in response to provider referral for pharmacy case management and/or care coordination services.   Consent to Services:  The patient was given information about Chronic Care Management services, agreed to services, and gave verbal consent prior to initiation of services.  Please see initial visit note for detailed documentation.   Patient Care Team: Rochel Brome, MD as PCP - General (Family Medicine) Revankar, Reita Cliche, MD as PCP - Cardiology (Cardiology) Deboraha Sprang, MD as PCP - Electrophysiology (Cardiology) Revankar, Reita Cliche, MD as Consulting Physician (Cardiology) Burnice Logan, [facility] as Pharmacist (Pharmacist)  Recent office visits: None since last CCM visit   Recent consult visits: 03/21/2020 - Emphasized diet and weight loss.   Hospital visits:   Objective:  Lab Results  Component Value Date   CREATININE 1.46 (H) 01/18/2020   BUN 22 01/18/2020   GFRNONAA 44 (L) 01/18/2020   GFRAA 51 (L) 01/18/2020   NA 139 01/18/2020   K 5.1 01/18/2020   CALCIUM 10.2 01/18/2020   CO2 22 01/18/2020    Lab Results  Component Value Date/Time   HGBA1C 5.6 09/23/2019 02:57 AM    Last diabetic Eye exam: No results found for: HMDIABEYEEXA  Last diabetic Foot exam: No results found for: HMDIABFOOTEX   Lab Results  Component Value Date   CHOL 146 01/18/2020   HDL 54 01/18/2020   LDLCALC 76 01/18/2020   TRIG 87 01/18/2020   CHOLHDL 2.7 01/18/2020    Hepatic Function Latest Ref Rng & Units 01/18/2020 12/06/2019 10/16/2019   Total Protein 6.0 - 8.5 g/dL 7.6 7.5 6.9  Albumin 3.6 - 4.6 g/dL 4.3 3.6 4.0  AST 0 - 40 IU/L 29 30 28   ALT 0 - 44 IU/L 19 18 13   Alk Phosphatase 44 - 121 IU/L 66 58 75  Total Bilirubin 0.0 - 1.2 mg/dL 0.6 1.3(H) 0.7  Bilirubin, Direct 0.00 - 0.40 mg/dL - - -    Lab Results  Component Value Date/Time   TSH 1.728 12/06/2019 12:30 AM   TSH 1.870 10/16/2019 08:34 AM   TSH 5.240 (H) 08/22/2019 09:06 AM    CBC Latest Ref Rng & Units 01/18/2020 12/08/2019 12/07/2019  WBC 3.4 - 10.8 x10E3/uL 7.2 7.9 7.9  Hemoglobin 13.0 - 17.7 g/dL 16.1 16.2 16.1  Hematocrit 37.5 - 51.0 % 47.9 50.1 49.9  Platelets 150 - 450 x10E3/uL 358 341 327    No results found for: VD25OH  Clinical ASCVD: Yes  The ASCVD Risk score Mikey Bussing DC Jr., et al., 2013) failed to calculate for the following reasons:   The 2013 ASCVD risk score is only valid for ages 14 to 69   The patient has a prior MI or stroke diagnosis    Depression screen [facility] 2/9 11/30/2019 08/07/2019  Decreased Interest 0 0  Down, Depressed, Hopeless 0 0  PHQ - 2 Score 0 0     Social History   Tobacco Use  Smoking Status Former Smoker  . Quit date: 12/18/2007  . Years since quitting: 70.4  Smokeless Tobacco Never Used   BP Readings from Last 3 Encounters:  03/21/20 126/70  01/23/20 128/68  01/19/20 104/60   Pulse Readings from Last 3 Encounters:  03/21/20 68  01/23/20 68  01/19/20 64   Wt Readings from Last 3 Encounters:  03/21/20 132 lb 3.2 oz (60 kg)  01/23/20 138 lb (62.6 kg)  01/19/20 137 lb 12.8 oz (62.5 kg)    Assessment/Interventions: Review of patient past medical history, allergies, medications, health status, including review of consultants reports, laboratory and other test data, was performed as part of comprehensive evaluation and provision of chronic care management services.   SDOH:  (Social Determinants of Health) assessments and interventions performed: Yes   CCM Care Plan  Allergies  Allergen Reactions  .  Other Other (See Comments)    Any narcotic makes him a "wild man"  Delusions (intolerance)    Medications Reviewed Today    Reviewed by Lowella Grip, CMA (Certified Medical Assistant) on 03/21/20 at 1242  Med List Status: <None>  Medication Order Taking? Sig Documenting Provider Last Dose Status Informant  Acetaminophen 500 MG capsule [id number]  Take 2 capsules (1,000 mg total) by mouth every 6 (six) hours as needed for fever or pain. Donzetta Starch, NP  Active Spouse/Significant Other  albuterol (VENTOLIN HFA) 108 (90 Base) MCG/ACT inhaler [id number]  Inhale 2 puffs into the lungs every 6 (six) hours as needed for wheezing or shortness of breath. Collene Gobble, MD  Active   aspirin EC 81 MG tablet [id number]  Take 81 mg by mouth daily. [provider]  Active Spouse/Significant Other  atorvastatin (LIPITOR) 80 MG tablet [id number]  Take 1 tablet (80 mg total) by mouth daily. Cox, Kirsten, MD  Active   Budeson-Glycopyrrol-Formoterol (BREZTRI AEROSPHERE) 160-9-4.8 MCG/ACT Hollie Salk [id number]  Inhale 2 puffs into the lungs in the morning and at bedtime. Collene Gobble, MD  Active   cilostazol (PLETAL) 100 MG tablet [id number]  Take 100 mg by mouth 2 (two) times daily.  Patient not taking: Reported on 01/29/2020   [provider]  Active Spouse/Significant Other  clopidogrel (PLAVIX) 75 MG tablet [id number]  TAKE 1 TABLET EVERY DAY  Patient taking differently: Take 75 mg by mouth daily.    Cox, Kirsten, MD  Active Spouse/Significant Other  fenofibrate 160 MG tablet [id number]  Take 1 tablet (160 mg total) by mouth daily. Cox, Kirsten, MD  Active   furosemide (LASIX) 20 MG tablet [id number]  Take 1 tablet (20 mg total) by mouth daily. Charlynne Cousins, MD  Active   levothyroxine (SYNTHROID) 112 MCG tablet [id number]  Take 1 tablet (112 mcg total) by mouth daily. Cox, Kirsten, MD  Active   losartan (COZAAR) 25 MG tablet [id number]  Take 0.5 tablets (12.5 mg total) by mouth daily. Cox,  Kirsten, MD  Active   metoprolol succinate (TOPROL-XL) 25 MG 24 hr tablet [id number]  Take 1 tablet (25 mg total) by mouth daily. Cox, Kirsten, MD  Active   Multiple Vitamins-Minerals (CENTRUM SILVER PO) [id number]  Take 1 tablet by mouth daily. [provider]  Active Spouse/Significant Other  nitroGLYCERIN (NITROSTAT) 0.4 MG SL tablet [id number]  Place 0.4 mg under the tongue every 5 (five) minutes as needed for chest pain. [provider]  Active Spouse/Significant Other           Med Note Alesia Banda, CHASIE F   Wed Dec 06, 2019  7:53 AM) Has on hand at home  PARoxetine (PAXIL) 20 MG tablet  [id number]  TAKE 1 TABLET EVERY DAY  Patient taking differently: Take 20 mg by mouth daily.    CoxElnita Maxwell, MD  Active Spouse/Significant Other          Patient Active Problem List   Diagnosis Date Noted  . Acquired thrombophilia ([place]) 01/23/2020  . Contusion of left periocular region 01/23/2020  . Acute respiratory failure with hypoxia ([place]) 01/23/2020  . Anxiety   . Arthritis   . CAD (coronary artery disease)   . Chronic combined systolic and diastolic CHF (congestive heart failure) ([place])   . Chronic respiratory failure ([place])   . CKD (chronic kidney disease), stage III ([place])   . COPD (chronic obstructive pulmonary disease) ([place])   . CVA (cerebral vascular accident) ([place])   . Former tobacco use   . Ischemic cardiomyopathy   . Malnutrition of moderate degree 12/07/2019  . CHF exacerbation ([place]) 12/05/2019  . Acute on chronic combined systolic and diastolic CHF (congestive heart failure) ([place]) 12/05/2019  . Acute on chronic respiratory failure with hypoxia ([place]) 12/05/2019  . Leukocytosis 12/05/2019  . Need for immunization against influenza 12/02/2019  . Medicare annual wellness visit, subsequent 12/02/2019  . Chronic respiratory failure with hypoxia ([place]) 10/29/2019  . Hypertensive heart disease without heart failure 10/29/2019  . Hemiparesis, speech and language deficits, and  cognitive deficits due to recent cerebral infarction ([place]) 09/25/2019  . Cerebral embolism with cerebral infarction - multifocal, s/p tPA, source unknown  09/25/2019  . Cardiomyopathy, ischemic 09/25/2019  . Hypothyroidism 09/25/2019  . Depression 09/25/2019  . Hypokalemia 09/25/2019  . Pulmonary emphysema ([place]) 09/25/2019  . Aortic atherosclerosis ([place]) 09/25/2019  . Unstable angina ([place])   . Ischemic heart disease 09/13/2019  . Exertional dyspnea 09/13/2019  . Carotid artery stenosis 03/26/2017  . Symptomatic carotid artery stenosis 03/05/2017  . Carotid bruit 11/25/2016  . Coronary artery disease involving native coronary artery of native heart without angina pectoris 11/20/2014  . Dyslipidemia 11/20/2014  . Essential hypertension 11/20/2014  . Peripheral vascular disease ([place]) 11/20/2014    Immunization History  Administered Date(s) Administered  . Fluad Quad(high Dose 65+) 11/30/2019  . Influenza-Unspecified 12/18/2016  . PFIZER(Purple Top)SARS-COV-2 Vaccination 04/01/2019, 04/22/2019, 01/09/2020  . Pneumococcal Polysaccharide-23 10/25/2008, 09/15/2019  . Tdap 01/15/2016  . Zoster Recombinat (Shingrix) 06/12/2019    Conditions to be addressed/monitored:  Heart Failure, Coronary Artery Disease, COPD and Hypothyroidism  Care Plan : CCM Pharmacy Care Plan  Updates made by Burnice Logan, RPH since 05/13/2020 12:00 AM    Problem: htn, chf, hld and copd   Priority: High  Onset Date: 05/03/2020    Long-Range Goal: Disease State Management   Start Date: 05/03/2020  Expected End Date: 05/03/2021  This Visit's Progress: On track  Priority: High  Note:   Current Barriers:  . Unable to independently afford treatment regimen   Pharmacist Clinical Goal(s):  Marland Kitchen Over the next 90 days, patient will verbalize ability to afford treatment regimen through collaboration with PharmD and provider.    Interventions: . 1:1 collaboration with Rochel Brome, MD regarding development and  update of comprehensive plan of care as evidenced by provider attestation and co-signature . Inter-disciplinary care team collaboration (see longitudinal plan of care) . Comprehensive medication review performed; medication list updated in electronic medical record  Hypertension (BP goal <130/80) -controlled -Current treatment: . Losartan 25 mg - 1/2 tablet daily  . Metoprolol succinate 25 mg daily  -Medications previously tried: none reported -Current home readings: at or below goal  -Current dietary  habits: low sodium diet. Wife is preparing meals currently but having health problems of her own. Trying to determine a box delivery service which could help.  -Current exercise habits: minimal. Not interested in cardio pulmonary rehab currently.  -Denies hypotensive/hypertensive symptoms -Educated on BP goals and benefits of medications for prevention of heart attack, stroke and kidney damage; Daily salt intake goal < 2300 mg; Exercise goal of 150 minutes per week; Importance of home blood pressure monitoring; -Counseled to monitor BP at home daily, document, and provide log at future appointments -Counseled on diet and exercise extensively Recommended to continue current medication  Hyperlipidemia/CAD: (LDL goal < 100) -controlled -Current treatment: . Atorvastatin 80 mg daily  . Fenofibrate 160 mg daily  -Medications previously tried: none reported  -Current dietary patterns: heart healthy - low sodium diet  -Current exercise habits: minimal. Refused cardio pulmonary rehab.  -Educated on Cholesterol goals;  Importance of limiting foods high in cholesterol; Exercise goal of 150 minutes per week; -Counseled on diet and exercise extensively Recommended to continue current medication  Heart Failure (Goal: control symptoms and prevent exacerbations) controlled Type: Combined Systolic and Diastolic -NYHA Class: III (marked limitation of activity) -Ejection fraction: 35-40% (Date:  09/22/2019) -Current treatment:  losartan 25 mg 1/2 tablet daily   Metoprolol succinate 25 mg daily   Furosemide 20 mg daily -Medications previously tried: none reported -Current home BP/HR readings: at or below goal  -Current dietary habits: low sodium diet prepared by wife -Current exercise routine: minimal  -Educated on Benefits of medications for managing symptoms and prolonging life Importance of weighing daily; if you gain more than 3 pounds in one day or 5 pounds in one week, recommended cardiopulmonary rehab.  Importance of blood pressure control -Counseled on diet and exercise extensively Recommended to continue current medication  COPD (Goal: control symptoms and prevent exacerbations) -controlled -Current treatment  . Breztri 2 puffs twice daily  -Medications previously tried: none reported  -Pulmonary function testing: 01/2020 FEV 81% -Exacerbations requiring treatment in last 6 months: none -Patient reports consistent use of maintenance inhaler -Frequency of rescue inhaler use: not used  -Counseled on Proper inhaler technique; Benefits of consistent maintenance inhaler use patient assistance for Breztri.  -Counseled on diet and exercise extensively Recommended to continue current medication Collaborated with [place] and [place] for Breztri [place].    Patient Goals/Self-Care Activities . Over the next 90 days, patient will:  - take medications as prescribed focus on medication adherence by using pill box weigh daily, and contact provider if weight gain of 3 lbs  Follow Up Plan: Telephone follow up appointment with care management team member scheduled for: 08/2020       Medication Assistance: Breztriobtained through [place] and [place] medication assistance program.  Enrollment ends 03/15/2021  Patient's preferred pharmacy is:  [place], [place] [address] Phone: [phone] Fax: [place]  Mail Delivery - [place], [address] [place] [address] Phone: [phone] Fax: [phone]  [facility] [place], [place] - [address] [place] [address] Phone: [phone] Fax: [phone]  Uses pill box? Yes Pt endorses 100% compliance  We discussed: Current pharmacy is preferred with insurance plan and patient is satisfied with pharmacy services Patient decided to: Continue current medication management strategy  Care Plan and Follow Up Patient Decision:  Patient agrees to  Care Plan and Follow-up.  Plan: Telephone follow up appointment with care management team member scheduled for:  June 2022

## 2020-05-13 NOTE — Patient Instructions (Addendum)
Visit Information  Goals Addressed            This Visit's Progress   . Learn More About My Health       Timeframe:  Long-Range Goal Priority:  High Start Date:        05/03/2020                     Expected End Date:          05/03/2021              Follow Up Date 08/21/2020    - make a list of questions - repeat what I heard to make sure I understand - bring a list of my medicines to the visit - speak up when I don't understand    Why is this important?    The best way to learn about your health and care is by talking to the doctor and nurse.   They will answer your questions and give you information in the way that you like best.    Notes:     Marland Kitchen Manage My Medicine       Timeframe:  Long-Range Goal Priority:  High Start Date:      05/03/2020                       Expected End Date:        05/03/2021               Follow Up Date 08/21/2020    - call for medicine refill 2 or 3 days before it runs out - keep a list of all the medicines I take; vitamins and herbals too - use a pillbox to sort medicine    Why is this important?   . These steps will help you keep on track with your medicines.   Notes:     . Track and Manage Activity and Exertion-Heart Failure       Timeframe:  Long-Range Goal Priority:  High Start Date:         05/03/2020                    Expected End Date:           05/03/2021            Follow Up Date 08/21/2020    - follow activity or exercise plan - join cardiac rehabilitation class - make an activity or exercise plan    Why is this important?    Exercising is very important when managing your heart failure.   It will help your heart get stronger.    Notes:       Patient Care Plan: CCM Pharmacy Care Plan    Problem Identified: htn, chf, hld and copd   Priority: High  Onset Date: 05/03/2020    Long-Range Goal: Disease State Management   Start Date: 05/03/2020  Expected End Date: 05/03/2021  This Visit's Progress: On track   Priority: High  Note:   Current Barriers:  . Unable to independently afford treatment regimen   Pharmacist Clinical Goal(s):  Marland Kitchen Over the next 90 days, patient will verbalize ability to afford treatment regimen through collaboration with PharmD and provider.    Interventions: . 1:1 collaboration with Rochel Brome, MD regarding development and update of comprehensive plan of care as evidenced by provider attestation and co-signature . Inter-disciplinary care team collaboration (see longitudinal plan of care) .  Comprehensive medication review performed; medication list updated in electronic medical record  Hypertension (BP goal <130/80) -controlled -Current treatment: . Losartan 25 mg - 1/2 tablet daily  . Metoprolol succinate 25 mg daily  -Medications previously tried: none reported -Current home readings: at or below goal  -Current dietary habits: low sodium diet. Wife is preparing meals currently but having health problems of her own. Trying to determine a box delivery service which could help.  -Current exercise habits: minimal. Not interested in cardio pulmonary rehab currently.  -Denies hypotensive/hypertensive symptoms -Educated on BP goals and benefits of medications for prevention of heart attack, stroke and kidney damage; Daily salt intake goal < 2300 mg; Exercise goal of 150 minutes per week; Importance of home blood pressure monitoring; -Counseled to monitor BP at home daily, document, and provide log at future appointments -Counseled on diet and exercise extensively Recommended to continue current medication  Hyperlipidemia/CAD: (LDL goal < 100) -controlled -Current treatment: . Atorvastatin 80 mg daily  . Fenofibrate 160 mg daily  -Medications previously tried: none reported  -Current dietary patterns: heart healthy - low sodium diet  -Current exercise habits: minimal. Refused cardio pulmonary rehab.  -Educated on Cholesterol goals;  Importance of limiting foods  high in cholesterol; Exercise goal of 150 minutes per week; -Counseled on diet and exercise extensively Recommended to continue current medication  Heart Failure (Goal: control symptoms and prevent exacerbations) controlled Type: Combined Systolic and Diastolic -NYHA Class: III (marked limitation of activity) -Ejection fraction: 35-40% (Date: 09/22/2019) -Current treatment:  losartan 25 mg 1/2 tablet daily   Metoprolol succinate 25 mg daily   Furosemide 20 mg daily -Medications previously tried: none reported -Current home BP/HR readings: at or below goal  -Current dietary habits: low sodium diet prepared by wife -Current exercise routine: minimal  -Educated on Benefits of medications for managing symptoms and prolonging life Importance of weighing daily; if you gain more than 3 pounds in one day or 5 pounds in one week, recommended cardiopulmonary rehab.  Importance of blood pressure control -Counseled on diet and exercise extensively Recommended to continue current medication  COPD (Goal: control symptoms and prevent exacerbations) -controlled -Current treatment  . Breztri 2 puffs twice daily  -Medications previously tried: none reported  -Pulmonary function testing: 01/2020 FEV 81% -Exacerbations requiring treatment in last 6 months: none -Patient reports consistent use of maintenance inhaler -Frequency of rescue inhaler use: not used  -Counseled on Proper inhaler technique; Benefits of consistent maintenance inhaler use patient assistance for Breztri.  -Counseled on diet and exercise extensively Recommended to continue current medication Collaborated with [place] and [place] for Breztri coverage.    Patient Goals/Self-Care Activities . Over the next 90 days, patient will:  - take medications as prescribed focus on medication adherence by using pill box weigh daily, and contact provider if weight gain of 3 lbs  Follow Up Plan: Telephone follow up appointment with care  management team member scheduled for: 08/2020       Patient verbalizes understanding of instructions provided today and agrees to view in [place].  Telephone follow up appointment with pharmacy team member scheduled for: 08/2020  Earl Mueller, [facility]   Pulmonary Rehabilitation Pulmonary rehabilitation, also called pulmonary rehab, is a program that helps people manage their breathing problems. The main goals are to increase endurance, reduce breathlessness, and improve quality of life. Pulmonary rehab can last 4-12 weeks or more, depending on your condition. You may need pulmonary rehab if:  You are recovering from lung surgery.  You have ongoing (chronic) lung problems or a condition that makes it hard to breathe, such as: ? A disease that causes scars in lung tissue (interstitial lung disease), including idiopathic pulmonary fibrosis and sarcoidosis. ? Chronic obstructive pulmonary disease (COPD). ? Cystic fibrosis. ? Diseases that affect the muscles used for breathing, such as muscular dystrophy. Benefits of pulmonary rehab Pulmonary rehab may help you:  Increase your ability to exercise.  Reduce breathing problems.  Quit smoking.  Learn how to eat a healthy diet.  Manage a healthy weight.  Learn how to use oxygen therapy.  Manage and understand your medicines and treatment.  Get support from health experts as well as other people with similar problems.  Manage anxiety and depression.  Teach your family about your condition and how to take part in your recovery. Tell a health care provider about:  Any allergies you have.  All medicines you are taking, including vitamins, herbs, eye drops, creams, and over-the-counter medicines.  Any blood disorders you have.  Any surgeries you have had.  Any medical conditions you have.  Whether you are pregnant or may be pregnant. What are the risks? Generally, pulmonary rehab is safe. However, problems may occur,  including:  Exercise-related injuries.  Higher risk for heart attack or stroke in some cases, due to certain types of exercise (rare). What happens before treatment?  You may have a physical exam in which your health care provider may: ? Do blood tests. ? Test how well you can breathe and how your lungs function. ? Test your ability to exercise, such as how long you can walk on a treadmill.  Your health care providers will work with you to make a treatment plan based on your health and your goals. Your program will be tailored to fit your needs and may change as you make progress. What happens during treatment? Exercise training Exercise training involves doing physical activity, such as:  Stretching routines.  Strength-building exercises with weights. You will work on becoming stronger in both your arms and legs.  Aerobic exercises to improve endurance. You may use a stationary bike or treadmill. Exercise training will vary based on how much activity you can handle, and it will slowly become harder or more intense as you build up your endurance. In most cases, you will have exercise training 3 days a week. After you finish pulmonary rehab, your health care provider will create an exercise program to help you maintain your progress. Education Your rehab team will teach you about your disease and ways you can manage symptoms. You may have one-on-one sessions or group meetings. You may learn:  How to avoid situations that can worsen symptoms.  When and how to take your medicines.  How to prevent lung infections.  How to quit smoking.  How to use oxygen therapy. Nutrition support Being overweight or underweight can make it harder to breathe. You may meet with nutritionists to come up with the right diet for you. This may include:  A healthy eating plan to help you lose weight.  Adding calorie or protein supplements to help you gain weight or avoid losing weight. Breathing  training Breathing exercises can help you better control your breathing by taking deeper breaths, less often. Breathing exercises may include:  Pursed-lip breathing. During this technique, you will inhale through your nose and exhale for 4-6 seconds through pursed lips (such as a kissing or whistling position).  Belly breathing (diaphragmatic breathing). During this technique, you place your hands  on your stomach and inhale through your nose. As you breathe in, you should feel your belly rise as air fills your diaphragm. Then, you exhale slowly through pursed lips as you feel your stomach falling.   Energy conservation training Your rehab team will show you how to complete everyday tasks without getting out of breath. This may include techniques for avoiding bending, lifting, or reaching. Counseling You may receive individual or group counseling during pulmonary rehab. These meetings can help with any anxiety, depression, or frustrations you may be feeling. Counseling may include:  Muscle relaxation exercises.  Techniques for dealing with stress or panic. Your family members and caregivers may also participate in counseling. What can I expect after the treatment? Follow these instructions at home:  Exercise regularly as told by your health care providers. Your health care providers will create an exercise plan to help you maintain your endurance and your overall health.  Do not use any products that contain nicotine or tobacco, such as cigarettes and e-cigarettes. If you need help quitting, ask your health care provider.  Take over-the-counter and prescription medicines only as told by your health care provider.  Keep all follow-up visits as told by your health care provider. This is important. Contact a health care provider if:  You have questions about your program.  You do not notice any improvement in your breathing or endurance.  You develop new or worse symptoms.  You have a  fever.  You have shortness of breath or fatigue when exercising. Get help right away if:  You have shortness of breath when you are sitting still or lying down.  You feel dizzy or faint. Summary  Pulmonary rehabilitation is a program that helps people manage their breathing problems. Your program will be tailored to you.  The main goals of pulmonary rehab are to increase endurance, reduce breathlessness, and improve quality of life.  Once you finish rehab, your health care providers will create an exercise plan to help you maintain your progress.  Do not use any products that contain nicotine or tobacco, such as cigarettes and e-cigarettes. If you need help quitting, ask your health care provider. This information is not intended to replace advice given to you by your health care provider. Make sure you discuss any questions you have with your health care provider. Document Revised: 01/05/2019 Document Reviewed: 06/08/2017 Elsevier Patient Education  [place].

## 2020-05-16 DIAGNOSIS — J449 Chronic obstructive pulmonary disease, unspecified: Secondary | ICD-10-CM | POA: Diagnosis not present

## 2020-05-17 ENCOUNTER — Ambulatory Visit: Payer: Medicare HMO

## 2020-05-21 ENCOUNTER — Telehealth: Payer: Self-pay

## 2020-05-21 NOTE — Telephone Encounter (Cosign Needed Addendum)
Patient's wife called to report that he is almost out of losartan and too soon to be filled with [facility] mail order. She states he also does not have cilostazol in his home medications. [employer] has not filled cilostazol prescription since June 2021 and [facility] has never filled medication. She thinks that he has not been taking and wants to know if he should resume Cilostazol?  [employer] can't fill losartan until April. Patient's wife thinks that their previous part time aide accidentally has been giving Mr. Swart a full tablet instead of half tablet as prescribed making him run out too soon. Pharmacist calling [employer] to get an override to fill losartan early. [employer] will not override and patient will have to purchase 3 week supply until [facility] will fill again. Patient understands and will order 3 week supply. Patient would benefit from enhanced pharmacy services (packaging and delivery) but disadvantaged due to cost.   Please advise if patient should resume Cilostazol. Wife is uncertain but patient could have been without for 6 months.   Sherre Poot, PharmD, [facility] Clinical Pharmacist [facility] [phone] (office) [phone] (mobile)

## 2020-05-22 ENCOUNTER — Other Ambulatory Visit: Payer: Self-pay | Admitting: Family Medicine

## 2020-05-22 ENCOUNTER — Other Ambulatory Visit: Payer: Self-pay

## 2020-05-22 DIAGNOSIS — I119 Hypertensive heart disease without heart failure: Secondary | ICD-10-CM

## 2020-05-22 MED ORDER — CILOSTAZOL 100 MG PO TABS: 100.0000 mg | ORAL_TABLET | Freq: Two times a day (BID) | ORAL | 1 refills | Status: AC

## 2020-05-22 MED ORDER — LOSARTAN POTASSIUM 25 MG PO TABS: 12.5000 mg | ORAL_TABLET | Freq: Every day | ORAL | 0 refills | Status: DC

## 2020-05-22 NOTE — Telephone Encounter (Signed)
Pt needs cilostazole and recommend increase losartan to 100 mg once daily, but have him only take 1/2 pills so he can get caught up with pills. Let me know if he would like this local or to the mail order.

## 2020-05-22 NOTE — Telephone Encounter (Cosign Needed)
Spoke with patient's wife. She acknowledges understanding of resuming cilostazol and requested 90 day prescription sent to [facility] mail order. Pharmacist requested from Dr. Alyse Low nurse and prescription submitted.   Encouraged wife to begin monitoring blood pressure now that patient has resumed Losartan 12.5 mg vs. 25 mg daily. Wife will call if blood pressure increases above goal.   Sherre Poot, PharmD, [facility] Clinical Pharmacist [facility] [phone] (office) [phone] (mobile)

## 2020-05-24 ENCOUNTER — Ambulatory Visit (INDEPENDENT_AMBULATORY_CARE_PROVIDER_SITE_OTHER): Payer: Medicare HMO

## 2020-05-24 DIAGNOSIS — I255 Ischemic cardiomyopathy: Secondary | ICD-10-CM | POA: Diagnosis not present

## 2020-05-24 LAB — CUP PACEART REMOTE DEVICE CHECK
Date Time Interrogation Session: 20220310145841
Implantable Pulse Generator Implant Date: 20210712

## 2020-06-03 ENCOUNTER — Telehealth: Payer: Self-pay

## 2020-06-03 NOTE — Telephone Encounter (Signed)
Earl Mueller called on behalf of patient requesting a appointment for tomorrow. Stated patient has a cough was unsure if he has pneumonia and was complaining of chest pain. He said "his chest pain was not heart related". Chest pain that radiates across his chest and has been present for the past 3 days  Patient does not have a dx of acid reflux and is currently on oxygen, therefore could determine if he was short of breath. Hoyle Sauer advised Earl Mueller that patient needed to be seen at the ER or by a Urgent Care and indicated that he may need a cxr as well as certain labs such as troponin levels. Earl Mueller agreed and stated she will attempt to have seen at the ER or Urgent Care.

## 2020-06-03 NOTE — Progress Notes (Signed)
Carelink Summary Report / Loop Recorder 

## 2020-06-04 DIAGNOSIS — Z681 Body mass index (BMI) 19 or less, adult: Secondary | ICD-10-CM | POA: Diagnosis not present

## 2020-06-04 DIAGNOSIS — R0902 Hypoxemia: Secondary | ICD-10-CM | POA: Diagnosis not present

## 2020-06-04 DIAGNOSIS — R918 Other nonspecific abnormal finding of lung field: Secondary | ICD-10-CM | POA: Diagnosis not present

## 2020-06-04 DIAGNOSIS — J849 Interstitial pulmonary disease, unspecified: Secondary | ICD-10-CM | POA: Diagnosis not present

## 2020-06-04 DIAGNOSIS — J449 Chronic obstructive pulmonary disease, unspecified: Secondary | ICD-10-CM | POA: Diagnosis not present

## 2020-06-04 DIAGNOSIS — R0602 Shortness of breath: Secondary | ICD-10-CM | POA: Diagnosis not present

## 2020-06-04 DIAGNOSIS — Z9981 Dependence on supplemental oxygen: Secondary | ICD-10-CM | POA: Diagnosis not present

## 2020-06-04 DIAGNOSIS — J440 Chronic obstructive pulmonary disease with acute lower respiratory infection: Secondary | ICD-10-CM | POA: Diagnosis not present

## 2020-06-04 DIAGNOSIS — R509 Fever, unspecified: Secondary | ICD-10-CM | POA: Diagnosis not present

## 2020-06-04 DIAGNOSIS — E785 Hyperlipidemia, unspecified: Secondary | ICD-10-CM | POA: Diagnosis not present

## 2020-06-04 DIAGNOSIS — R911 Solitary pulmonary nodule: Secondary | ICD-10-CM | POA: Diagnosis not present

## 2020-06-04 DIAGNOSIS — J811 Chronic pulmonary edema: Secondary | ICD-10-CM | POA: Diagnosis not present

## 2020-06-04 DIAGNOSIS — J159 Unspecified bacterial pneumonia: Secondary | ICD-10-CM | POA: Diagnosis not present

## 2020-06-04 DIAGNOSIS — J189 Pneumonia, unspecified organism: Secondary | ICD-10-CM | POA: Diagnosis not present

## 2020-06-04 DIAGNOSIS — J9620 Acute and chronic respiratory failure, unspecified whether with hypoxia or hypercapnia: Secondary | ICD-10-CM | POA: Diagnosis not present

## 2020-06-04 DIAGNOSIS — E43 Unspecified severe protein-calorie malnutrition: Secondary | ICD-10-CM | POA: Diagnosis not present

## 2020-06-04 DIAGNOSIS — R079 Chest pain, unspecified: Secondary | ICD-10-CM | POA: Diagnosis not present

## 2020-06-04 DIAGNOSIS — J439 Emphysema, unspecified: Secondary | ICD-10-CM | POA: Diagnosis not present

## 2020-06-04 DIAGNOSIS — J441 Chronic obstructive pulmonary disease with (acute) exacerbation: Secondary | ICD-10-CM | POA: Diagnosis not present

## 2020-06-04 DIAGNOSIS — I2510 Atherosclerotic heart disease of native coronary artery without angina pectoris: Secondary | ICD-10-CM | POA: Diagnosis not present

## 2020-06-04 DIAGNOSIS — R059 Cough, unspecified: Secondary | ICD-10-CM | POA: Diagnosis not present

## 2020-06-04 DIAGNOSIS — J9621 Acute and chronic respiratory failure with hypoxia: Secondary | ICD-10-CM | POA: Diagnosis not present

## 2020-06-05 DIAGNOSIS — J441 Chronic obstructive pulmonary disease with (acute) exacerbation: Secondary | ICD-10-CM | POA: Diagnosis not present

## 2020-06-05 DIAGNOSIS — I2510 Atherosclerotic heart disease of native coronary artery without angina pectoris: Secondary | ICD-10-CM | POA: Diagnosis not present

## 2020-06-05 DIAGNOSIS — E785 Hyperlipidemia, unspecified: Secondary | ICD-10-CM | POA: Diagnosis not present

## 2020-06-05 DIAGNOSIS — R918 Other nonspecific abnormal finding of lung field: Secondary | ICD-10-CM | POA: Diagnosis not present

## 2020-06-06 DIAGNOSIS — R918 Other nonspecific abnormal finding of lung field: Secondary | ICD-10-CM | POA: Diagnosis not present

## 2020-06-06 DIAGNOSIS — I2510 Atherosclerotic heart disease of native coronary artery without angina pectoris: Secondary | ICD-10-CM | POA: Diagnosis not present

## 2020-06-06 DIAGNOSIS — E785 Hyperlipidemia, unspecified: Secondary | ICD-10-CM | POA: Diagnosis not present

## 2020-06-06 DIAGNOSIS — J441 Chronic obstructive pulmonary disease with (acute) exacerbation: Secondary | ICD-10-CM | POA: Diagnosis not present

## 2020-06-07 ENCOUNTER — Telehealth: Payer: Self-pay

## 2020-06-07 DIAGNOSIS — E785 Hyperlipidemia, unspecified: Secondary | ICD-10-CM | POA: Insufficient documentation

## 2020-06-07 NOTE — Telephone Encounter (Signed)
  Transition Care Management Follow-up Telephone Call    Earl Mueller 06/04/1935  Admit Date: 06/04/20 Discharge Date: 06/06/20 Discharged from where: [facility]  Diagnoses: COPD Exacerbation, Chest Pain, Hypoxia, Mass of lower lobe of the left lung, PNA  2 day post discharge: 06/08/20 7 day post discharge: 06/13/20 14 day post discharge: 06/20/20  Earl Mueller was discharged from [facility] on 06/06/20 with the diagnoses listed above.  He was contacted today via telephone in regards to transition of care.   First Attempt - left message on home voicemail   Patient presented to the ED with complaints of chest pain and increased SHOB.  His O2 had to be increased to 5L to maintain SATS >90%.   -WBC 13,000, Elevated Lactic Acid 3.3, Flu/COVID NEGATIVE  -CXR showed left lower lobe PNA  -Chest CT showed development of two left lower lobe pulmonary masses measuring up to 4.1 cm with associated bilateral hilar and mediastinal lymphadenopathy - Dr Alcide Clever recommended CT Guided biopsy and patient declined  Other CT Findings include: severe emphysema, mild pulmonary edema, coronary artery calcifications, and Aortic Atherosclerosis   -Patient was treated with Azithromycin and Rocephin in the ED and Azithromycin and Zosyn during admission  -Patient reported improvement and O2 decreased to 3L by time of discharge  - Negative cardiac enzymes x 3  - Severe protein calorie malnutrition - BMI 18 and evidence of muscle wasting   Discharge Medications (NEW): -Augmentin 500 mg PO TID #21 -Azithromycin 500 mg PO QD #2 -Culturelle PO QD #21 -Prednisone 40 mg PO QD #8 -Mucinex 1200 mg PO Q12H #20  Discharge Instructions: Follow-up with PCP in one week and Dr Alcide Clever in two weeks   Medications obtained and verified? Yes   Other? No   Any new allergies since your discharge? No   Dietary orders reviewed? Yes  Do you have support at home? Yes   Home Care and  Equipment/Supplies: Were home health services ordered? no Were any new equipment or medical supplies ordered?  No   Shelle Iron, LPN 70/96/28 3:66 AM

## 2020-06-10 ENCOUNTER — Other Ambulatory Visit: Payer: Self-pay | Admitting: Family Medicine

## 2020-06-12 ENCOUNTER — Other Ambulatory Visit: Payer: Self-pay

## 2020-06-12 ENCOUNTER — Telehealth: Payer: Self-pay

## 2020-06-12 DIAGNOSIS — I119 Hypertensive heart disease without heart failure: Secondary | ICD-10-CM

## 2020-06-12 MED ORDER — LOSARTAN POTASSIUM 25 MG PO TABS: 12.5000 mg | ORAL_TABLET | Freq: Every day | ORAL | 0 refills | Status: AC

## 2020-06-12 NOTE — Telephone Encounter (Cosign Needed Addendum)
  Chronic Care Management   Note  06/12/2020 Name: Earl Mueller MRN: [id number] DOB: 1936/01/02  Patient called to request a refill of losartan and clopidogrel sent to [facility] mail order. Pharmacist requested from Dr Cox's nurse. Patient was recently hospitalized and lung masses found. Wife hopes that they can talk to Dr. Tobie Poet next week during their visit. She and her daughter feel they may need to pursue a biopsy but Earl Mueller does not at this time. Earl Mueller reports Earl Mueller has a good appetite.   Pharmacist called [employer] to coordinate fill.  Prescription has been received and pharmacist is working to fill medication per [employer] at [place]. She states that medication should be to patient within 7-10 days.   BP readings at home ~120/70 mmHg  Sherre Poot, PharmD, [facility] Clinical Pharmacist [facility] [phone] (office) [phone] (mobile)

## 2020-06-13 ENCOUNTER — Other Ambulatory Visit: Payer: Self-pay

## 2020-06-13 MED ORDER — CLOPIDOGREL BISULFATE 75 MG PO TABS: 75.0000 mg | ORAL_TABLET | Freq: Every day | ORAL | 1 refills | Status: AC

## 2020-06-16 DIAGNOSIS — J449 Chronic obstructive pulmonary disease, unspecified: Secondary | ICD-10-CM | POA: Diagnosis not present

## 2020-06-19 ENCOUNTER — Telehealth: Payer: Self-pay | Admitting: Family Medicine

## 2020-06-19 NOTE — Telephone Encounter (Signed)
LEFT MESSAGE TO R/S TO ANOTHER PROVIDER DUE TO DR. COX BEING OUT

## 2020-06-20 ENCOUNTER — Ambulatory Visit (INDEPENDENT_AMBULATORY_CARE_PROVIDER_SITE_OTHER): Payer: Medicare HMO | Admitting: Legal Medicine

## 2020-06-20 ENCOUNTER — Encounter: Payer: Self-pay | Admitting: Legal Medicine

## 2020-06-20 ENCOUNTER — Other Ambulatory Visit: Payer: Self-pay

## 2020-06-20 ENCOUNTER — Inpatient Hospital Stay: Payer: Medicare HMO | Admitting: Family Medicine

## 2020-06-20 DIAGNOSIS — I25119 Atherosclerotic heart disease of native coronary artery with unspecified angina pectoris: Secondary | ICD-10-CM

## 2020-06-20 DIAGNOSIS — D6869 Other thrombophilia: Secondary | ICD-10-CM | POA: Diagnosis not present

## 2020-06-20 DIAGNOSIS — I69319 Unspecified symptoms and signs involving cognitive functions following cerebral infarction: Secondary | ICD-10-CM

## 2020-06-20 DIAGNOSIS — E440 Moderate protein-calorie malnutrition: Secondary | ICD-10-CM

## 2020-06-20 DIAGNOSIS — R918 Other nonspecific abnormal finding of lung field: Secondary | ICD-10-CM

## 2020-06-20 DIAGNOSIS — I69359 Hemiplegia and hemiparesis following cerebral infarction affecting unspecified side: Secondary | ICD-10-CM

## 2020-06-20 DIAGNOSIS — I700 Atherosclerosis of aorta: Secondary | ICD-10-CM | POA: Diagnosis not present

## 2020-06-20 DIAGNOSIS — I69328 Other speech and language deficits following cerebral infarction: Secondary | ICD-10-CM | POA: Diagnosis not present

## 2020-06-20 DIAGNOSIS — J431 Panlobular emphysema: Secondary | ICD-10-CM

## 2020-06-20 DIAGNOSIS — J9611 Chronic respiratory failure with hypoxia: Secondary | ICD-10-CM | POA: Diagnosis not present

## 2020-06-20 NOTE — Assessment & Plan Note (Signed)
Actually 2 masses found on CT in march 2022, 4.1 cm in size

## 2020-06-20 NOTE — Progress Notes (Signed)
Subjective:  Patient ID: Earl Mueller, male    DOB: [birth year]  Age: 85 y.o. MRN: [id number]  Chief Complaint  Patient presents with  . Hospitalization Follow-up    Patient was admitted on 06/04/2020 to 06/06/2020 at [facility].    HPI: patient presents for transition of care and reconciliation of medicines after hospitalization at [facility].  He was admitted on 3.22.2022 for COPD with exacerbation  He had pneumonia and was treated with antibiotics and oxygen.  CT noted 2 masses in LLL.  He was discharged on 06/06/2020, he was discharged on 4 liter O2.  The infection has resolved but he remains weak.  Still has hard cough.   Current Outpatient Medications on File Prior to Visit  Medication Sig Dispense Refill  . Acetaminophen 500 MG capsule Take 2 capsules (1,000 mg total) by mouth every 6 (six) hours as needed for fever or pain. 30 capsule 0  . aspirin EC 81 MG tablet Take 81 mg by mouth daily.    Marland Kitchen atorvastatin (LIPITOR) 80 MG tablet TAKE 1 TABLET EVERY DAY 90 tablet 1  . Budeson-Glycopyrrol-Formoterol (BREZTRI AEROSPHERE) 160-9-4.8 MCG/ACT AERO Inhale 2 puffs into the lungs in the morning and at bedtime. 10.7 g 11  . cilostazol (PLETAL) 100 MG tablet Take 1 tablet (100 mg total) by mouth 2 (two) times daily. 180 tablet 1  . clopidogrel (PLAVIX) 75 MG tablet Take 1 tablet (75 mg total) by mouth daily. 90 tablet 1  . fenofibrate 160 MG tablet TAKE 1 TABLET EVERY DAY 90 tablet 1  . furosemide (LASIX) 20 MG tablet Take 1 tablet (20 mg total) by mouth daily. 30 tablet 11  . levothyroxine (SYNTHROID) 112 MCG tablet TAKE 1 TABLET EVERY DAY 90 tablet 1  . losartan (COZAAR) 25 MG tablet Take 0.5 tablets (12.5 mg total) by mouth daily. 45 tablet 0  . metoprolol succinate (TOPROL-XL) 25 MG 24 hr tablet TAKE 1 TABLET EVERY DAY 90 tablet 1  . Multiple Vitamins-Minerals (CENTRUM SILVER PO) Take 1 tablet by mouth daily.    . nitroGLYCERIN (NITROSTAT) 0.4 MG SL tablet Place 0.4 mg under  the tongue every 5 (five) minutes as needed for chest pain.    Marland Kitchen PARoxetine (PAXIL) 20 MG tablet TAKE 1 TABLET EVERY DAY 90 tablet 3   No current facility-administered medications on file prior to visit.   Past Medical History:  Diagnosis Date  . Acquired thrombophilia ([place]) 01/23/2020  . Acute on chronic combined systolic and diastolic CHF (congestive heart failure) ([place]) 12/05/2019  . Acute on chronic respiratory failure with hypoxia ([place]) 12/05/2019  . Acute respiratory failure with hypoxia ([place]) 01/23/2020  . Anxiety   . Aortic atherosclerosis ([place]) 09/25/2019  . Arthritis   . CAD (coronary artery disease)    a.  s/p DES to LAD/LCx in 09/2019.  . Cardiomyopathy, ischemic 09/25/2019  . Carotid artery stenosis    a. s/p L CEA 2019. b. 09/2019: Per neurology's note, his long term BP goal is 130-150 with high grade stenosis bilateral ICA siphon and right petrous ICA.  Marland Kitchen Carotid bruit 11/25/2016  . Cerebral embolism with cerebral infarction - multifocal, s/p tPA, source unknown  09/25/2019  . CHF exacerbation ([place]) 12/05/2019  . Chronic combined systolic and diastolic CHF (congestive heart failure) ([place])   . Chronic respiratory failure ([place])   . Chronic respiratory failure with hypoxia ([place]) 10/29/2019  . CKD (chronic kidney disease), stage III ([place])   . Contusion of left periocular region 01/23/2020  .  COPD (chronic obstructive pulmonary disease) ([place])   . Coronary artery disease involving native coronary artery of native heart without angina pectoris 11/20/2014  . CVA (cerebral vascular accident) ([place])   . Depression 09/25/2019  . Essential hypertension   . Exertional dyspnea 09/13/2019  . Former tobacco use   . Hemiparesis, speech and language deficits, and cognitive deficits due to recent cerebral infarction ([place]) 09/25/2019  . Hypertensive heart disease without heart failure 10/29/2019  . Hypokalemia 09/25/2019  . Hypothyroidism   . Ischemic cardiomyopathy   . Ischemic heart disease 09/13/2019   . Leukocytosis 12/05/2019  . Malnutrition of moderate degree 12/07/2019  . Medicare annual wellness visit, subsequent 12/02/2019  . Need for immunization against influenza 12/02/2019  . Peripheral vascular disease ([place])    a. s/p aortobifem BPG 2007.  . Pulmonary emphysema ([place]) 09/25/2019  . Symptomatic carotid artery stenosis 03/05/2017  . Unstable angina [facility])    Past Surgical History:  Procedure Laterality Date  . BACK SURGERY    . CARDIAC CATHETERIZATION  03/06/10   NL-LM, 40%pLAD, 30% mid/distal LAD, 98% ostial DIAG (small), 30%pCx, 50%mCx, 30%dCx, 30% OM2, 40%dRCA; Med RX rec ([facility])  . caritid endart    . CORONARY ATHERECTOMY N/A 09/14/2019   Procedure: CORONARY ATHERECTOMY;  Surgeon: Martinique, Peter M, MD;  Location: [place] CV LAB;  Service: Cardiovascular;  Laterality: N/A;  . CORONARY ATHERECTOMY N/A 09/15/2019   Procedure: CORONARY ATHERECTOMY;  Surgeon: Martinique, Peter M, MD;  Location: [place] CV LAB;  Service: Cardiovascular;  Laterality: N/A;  . CORONARY STENT INTERVENTION N/A 09/14/2019   Procedure: CORONARY STENT INTERVENTION;  Surgeon: Martinique, Peter M, MD;  Location: [place] CV LAB;  Service: Cardiovascular;  Laterality: N/A;  . CORONARY STENT INTERVENTION N/A 09/15/2019   Procedure: CORONARY STENT INTERVENTION;  Surgeon: Martinique, Peter M, MD;  Location: [place] CV LAB;  Service: Cardiovascular;  Laterality: N/A;  . ENDARTERECTOMY Left 03/26/2017   Procedure: ENDARTERECTOMY CAROTID LEFT;  Surgeon: Rosetta Posner, MD;  Location: [facility] OR;  Service: Vascular;  Laterality: Left;  . femerao bypass    . LEFT HEART CATH AND CORONARY ANGIOGRAPHY N/A 02/03/2017   Procedure: LEFT HEART CATH AND CORONARY ANGIOGRAPHY;  Surgeon: Martinique, Peter M, MD;  Location: [place] CV LAB;  Service: Cardiovascular;  Laterality: N/A;  . LEFT HEART CATH AND CORONARY ANGIOGRAPHY N/A 09/14/2019   Procedure: LEFT HEART CATH AND CORONARY ANGIOGRAPHY;  Surgeon: Martinique, Peter M, MD;  Location: [place]  CV LAB;  Service: Cardiovascular;  Laterality: N/A;  . LOOP RECORDER INSERTION N/A 09/25/2019   Procedure: LOOP RECORDER INSERTION;  Surgeon: Deboraha Sprang, MD;  Location: [place] CV LAB;  Service: Cardiovascular;  Laterality: N/A;  . PATCH ANGIOPLASTY Left 03/26/2017   Procedure: PATCH ANGIOPLASTY LEFT CAROTID ARTERY;  Surgeon: Rosetta Posner, MD;  Location: [place];  Service: Vascular;  Laterality: Left;  . SCALP LACERATION REPAIR N/A 05/23/2013   Procedure: EXCISION SCALP ULCER DEBRIDEMENT OF SKIN AND BONE WITH PLACEMENT OF A-CELL;  Surgeon: Irene Limbo, MD;  Location: [place];  Service: Plastics;  Laterality: N/A;  . STOMACH SURGERY     x2  . VASCULAR SURGERY     AFBG 10/15/05    Family History  Problem Relation Age of Onset  . Heart failure Father    Social History   Socioeconomic History  . Marital status: Married    Spouse name: Not on file  . Number of children: Not on file  . Years of  education: Not on file  . Highest education level: Not on file  Occupational History  . Not on file  Tobacco Use  . Smoking status: Former Smoker    Quit date: 12/18/2007    Years since quitting: 12.5  . Smokeless tobacco: Never Used  Vaping Use  . Vaping Use: Never used  Substance and Sexual Activity  . Alcohol use: Yes    Comment: occ  . Drug use: No  . Sexual activity: Not on file  Other Topics Concern  . Not on file  Social History Narrative  . Not on file   Social Determinants of Health   Financial Resource Strain: Not on file  Food Insecurity: No Food Insecurity  . Worried About Charity fundraiser in the Last Year: Never true  . Ran Out of Food in the Last Year: Never true  Transportation Needs: No Transportation Needs  . Lack of Transportation (Medical): No  . Lack of Transportation (Non-Medical): No  Physical Activity: Inactive  . Days of Exercise per Week: 0 days  . Minutes of Exercise per Session: 0 min  Stress: Not on file  Social Connections: Not on file     Review of Systems  Constitutional: Negative for activity change and appetite change.  HENT: Negative for congestion and sinus pain.   Eyes: Negative for visual disturbance.  Respiratory: Positive for cough and shortness of breath. Negative for chest tightness.   Cardiovascular: Negative for chest pain, palpitations and leg swelling.  Gastrointestinal: Negative for abdominal distention and abdominal pain.  Endocrine: Negative for polyuria.  Genitourinary: Negative for difficulty urinating and dysuria.  Musculoskeletal: Negative for arthralgias and back pain.  Skin: Negative.   Allergic/Immunologic: Negative for immunocompromised state.  Neurological: Negative.   Psychiatric/Behavioral: Negative.      Objective:  BP 102/60   Pulse 88   Temp 97.6 F (36.4 C)   Resp 18   Ht 5' 10.5" (1.791 m)   Wt 143 lb (64.9 kg)   SpO2 95% Comment: 4 %  BMI 20.23 kg/m   BP/Weight 06/20/2020 03/21/2020 46/07/352  Systolic BP 656 812 751  Diastolic BP 60 70 68  Wt. (Lbs) 143 132.2 138  BMI 20.23 18.7 19.25    Physical Exam Vitals reviewed.  Constitutional:      Appearance: Normal appearance. He is ill-appearing.  HENT:     Head: Normocephalic.     Right Ear: Tympanic membrane normal.     Left Ear: Tympanic membrane normal.     Nose: Nose normal.     Mouth/Throat:     Mouth: Mucous membranes are moist.     Pharynx: Oropharynx is clear.  Eyes:     Extraocular Movements: Extraocular movements intact.     Conjunctiva/sclera: Conjunctivae normal.     Pupils: Pupils are equal, round, and reactive to light.  Cardiovascular:     Rate and Rhythm: Normal rate and regular rhythm.     Pulses: Normal pulses.     Heart sounds: Normal heart sounds. No murmur heard. No gallop.   Pulmonary:     Effort: Pulmonary effort is normal. No respiratory distress.     Breath sounds: Rhonchi present. No rales.  Abdominal:     General: Abdomen is flat. Bowel sounds are normal. There is no distension.      Palpations: Abdomen is soft.     Tenderness: There is no abdominal tenderness.  Musculoskeletal:        General: Normal range of motion.  Cervical back: Normal range of motion and neck supple.  Skin:    General: Skin is warm.     Capillary Refill: Capillary refill takes less than 2 seconds.  Neurological:     General: No focal deficit present.     Mental Status: He is alert and oriented to person, place, and time. Mental status is at baseline.       Lab Results  Component Value Date   WBC 7.2 01/18/2020   HGB 16.1 01/18/2020   HCT 47.9 01/18/2020   PLT 358 01/18/2020   GLUCOSE 96 01/18/2020   CHOL 146 01/18/2020   TRIG 87 01/18/2020   HDL 54 01/18/2020   LDLCALC 76 01/18/2020   ALT 19 01/18/2020   AST 29 01/18/2020   NA 139 01/18/2020   K 5.1 01/18/2020   CL 101 01/18/2020   CREATININE 1.46 (H) 01/18/2020   BUN 22 01/18/2020   CO2 22 01/18/2020   TSH 1.728 12/06/2019   INR 1.2 09/21/2019   HGBA1C 5.6 09/23/2019      Assessment & Plan:   1. Mass of lower lobe of left lung Patient was found to have 2 masses in the left lower lobe measuring 4.1 cm that was not present at the last CT.  Is felt this.  Represents a malignancy and we had an extensive talk about biopsy and treatment.  The cardiologist does not believe he could survive any surgery chemo or radiation.  He is also a danger of having a pneumothorax with a biopsy.  Patient does not want to treat even if cancer.  Patient has multiple medical problems including severe COPD coronary artery disease and some heart failure.  He had a stroke last year with right-sided hemiparesis.  We discussed at length his prognosis with his medical problems as well as with possible malignancy.  He does have a living well and we discussed DNR.  I also discussed at length starting hospice which his daughter and wife wants patient does not.  They will discuss it further at home.  2. Moderate protein-calorie malnutrition  ([place]) Supplement nutrition with protein/calorie supplement with meals to improve nutritional status.  Patient is malnourished and extremely weak and stays most of the time in bed he can walk with a walker.  3. Hemiparesis, speech and language deficits, and cognitive deficits due to recent cerebral infarction [facility]) Patient has weakness continuing on the right side and has difficulty ambulating.  He is extremely weak in proximal and distal muscles.  4. Panlobular emphysema ([place]) An individualize plan was formulated for care of COPD.  Treatment is evidence based.  She will continue on inhalers, avoid smoking and smoke.  Regular exercise with help with dyspnea. Routine follow ups and medication compliance is needed.  5. Aortic atherosclerosis ([place]) Patient ws found to have aortic atherosclerosis on ct scan  6. Chronic respiratory failure with hypoxia ([facility]) Patient has chronic respiratory failure requiring continue O2 by nasal canula 4 L.min  7. Acquired thrombophilia ([place]) Patient is on pletal, formerly on full anticogualtion  8. Atherosclerosis of native coronary artery of native heart with angina pectoris [facility]) Patient's CAD was assessed using history and physical along with other information to maximize treatment.  Evidence based criteria was use in deciding proper management for this disease process.  Patient's CAD is under good control.therapy continue present treatment.    Visit lasted 60 minutes with review of records and extensive communication about hospice, his overall prognosis and DNR. We discussed the risks and  benefits of biopsy of lung mass.     Follow-up: Return if symptoms worsen or fail to improve.  An After Visit Summary was printed and given to the patient.  Reinaldo Meeker, MD [facility] [phone]

## 2020-06-24 ENCOUNTER — Ambulatory Visit (INDEPENDENT_AMBULATORY_CARE_PROVIDER_SITE_OTHER): Payer: Medicare HMO

## 2020-06-24 DIAGNOSIS — I255 Ischemic cardiomyopathy: Secondary | ICD-10-CM

## 2020-06-26 LAB — CUP PACEART REMOTE DEVICE CHECK
Date Time Interrogation Session: 20220412145639
Implantable Pulse Generator Implant Date: 20210712

## 2020-06-27 ENCOUNTER — Telehealth: Payer: Self-pay

## 2020-06-27 NOTE — Telephone Encounter (Signed)
Hospice is approved.  Prefer hospice assume care unless pt is adamant that I do.  kc

## 2020-06-27 NOTE — Telephone Encounter (Signed)
Manus Gunning from [facility] called asking for approval to evaluate pt. Pt's family is requesting this service. If there is approval they also need to know if you will remain seeing pt or would like hospice physician to see pt.   Call back #: [phone].   Harrell Lark 06/27/20 4:27 PM

## 2020-06-28 NOTE — Telephone Encounter (Signed)
Hospice made aware and stated they will have patient evaluated.

## 2020-07-09 NOTE — Progress Notes (Signed)
Carelink Summary Report / Loop Recorder 

## 2020-07-14 DEATH — deceased

## 2020-07-24 ENCOUNTER — Ambulatory Visit: Payer: Medicare HMO | Admitting: Cardiology

## 2020-07-25 ENCOUNTER — Other Ambulatory Visit: Payer: Medicare HMO

## 2020-07-30 ENCOUNTER — Ambulatory Visit: Payer: Medicare HMO | Admitting: Family Medicine

## 2020-08-21 ENCOUNTER — Telehealth: Payer: Medicare HMO

## 2021-06-17 IMAGING — DX DG CHEST 1V PORT
1 series · 1 of 1 positions shown · non-contrast
Comparison: September 16, 2019.

CLINICAL DATA: Dyspnea on exertion.

EXAM:
PORTABLE CHEST 1 VIEW

[chest ap]
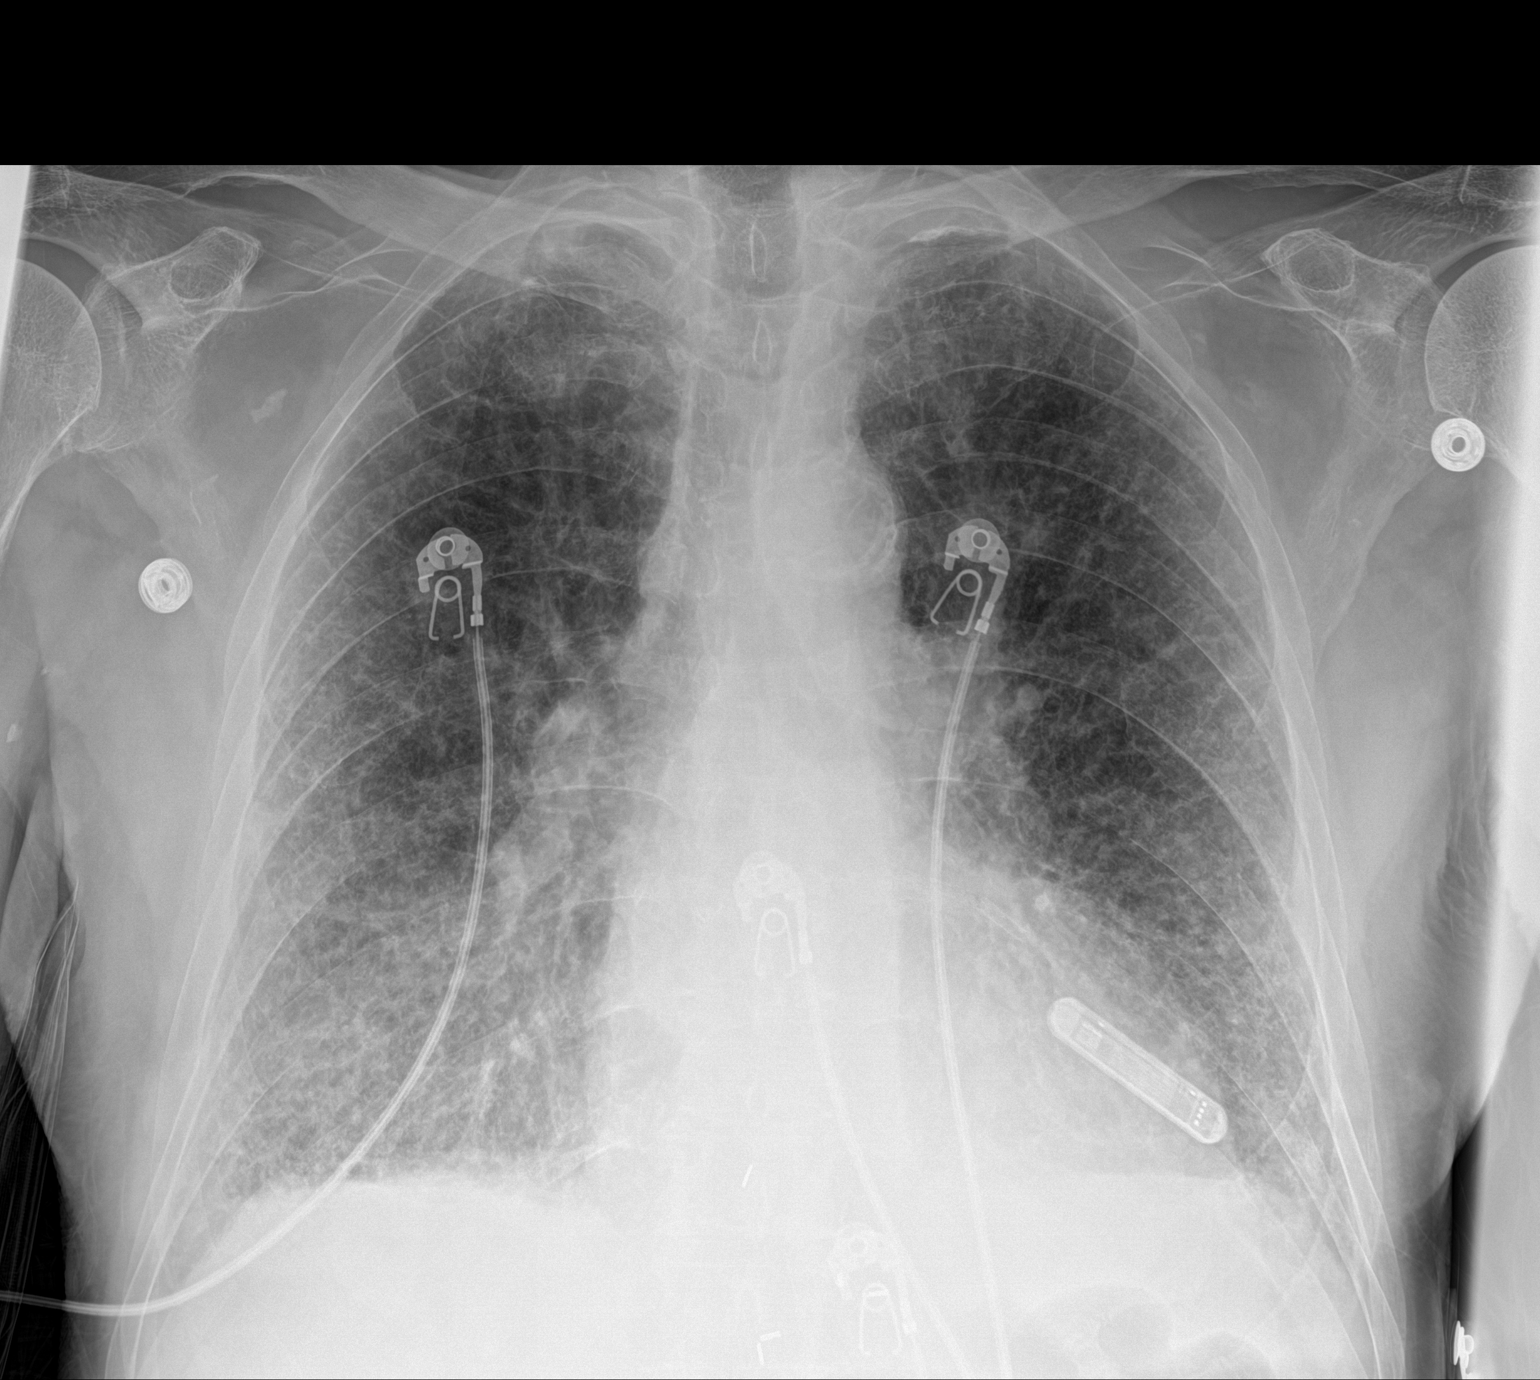

[1 of 1 positions shown; findings below may reference images not displayed]

FINDINGS: The heart size and mediastinal contours are within normal limits. No
pneumothorax or pleural effusion is noted. Increased reticular
densities are noted throughout both lungs concerning for worsening
edema or atypical inflammation superimposed upon chronic
interstitial lung disease. The visualized skeletal structures are
unremarkable.
IMPRESSION: Increased reticular densities are noted throughout both lungs
concerning for worsening edema or atypical inflammation superimposed
upon chronic interstitial lung disease.

Aortic Atherosclerosis ([id number]).

## 2021-06-27 IMAGING — DX DG CHEST 2V
2 series · 2 of 2 positions shown · non-contrast
Comparison: CTA chest dated 12/05/2019

CLINICAL DATA: CHF

EXAM:
CHEST - 2 VIEW

[chest pa]
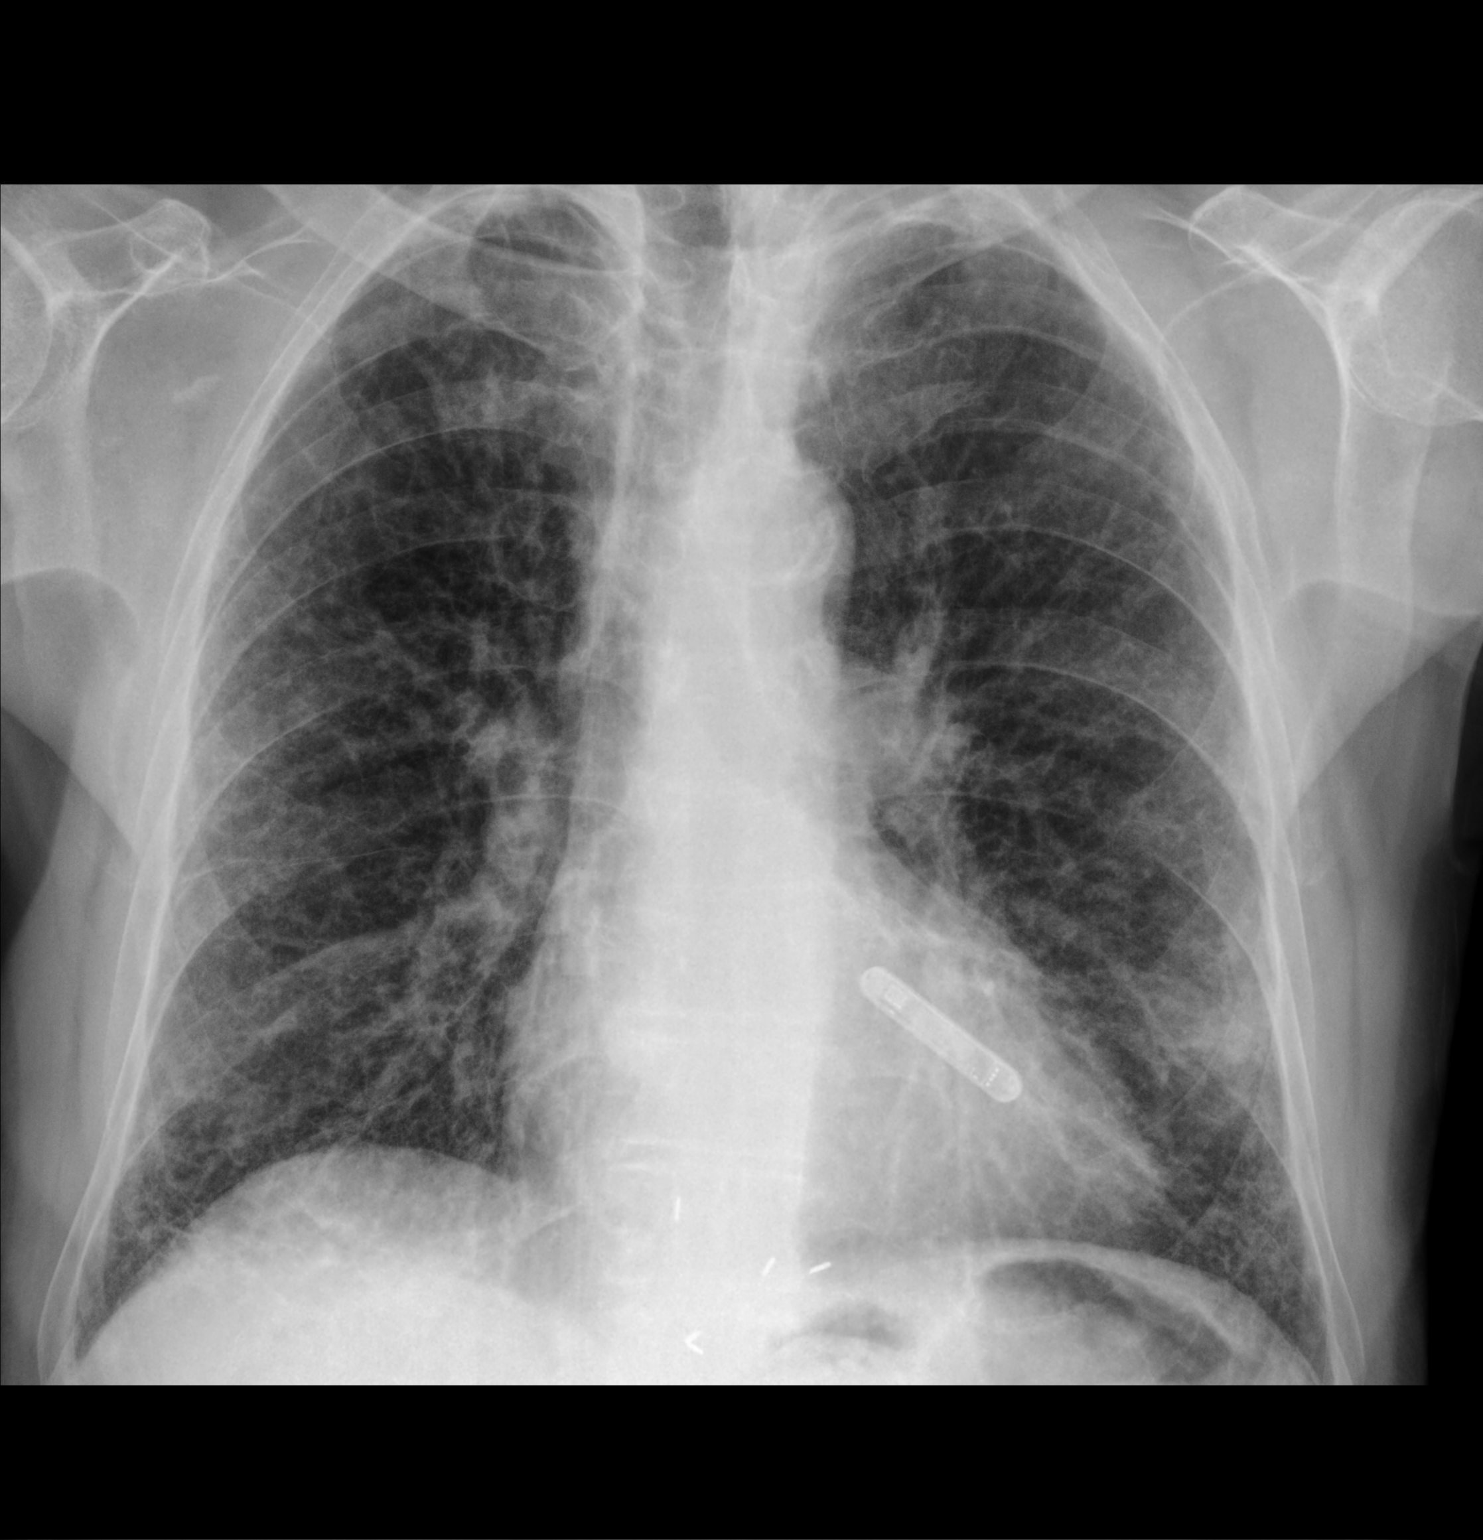

[chest lat]
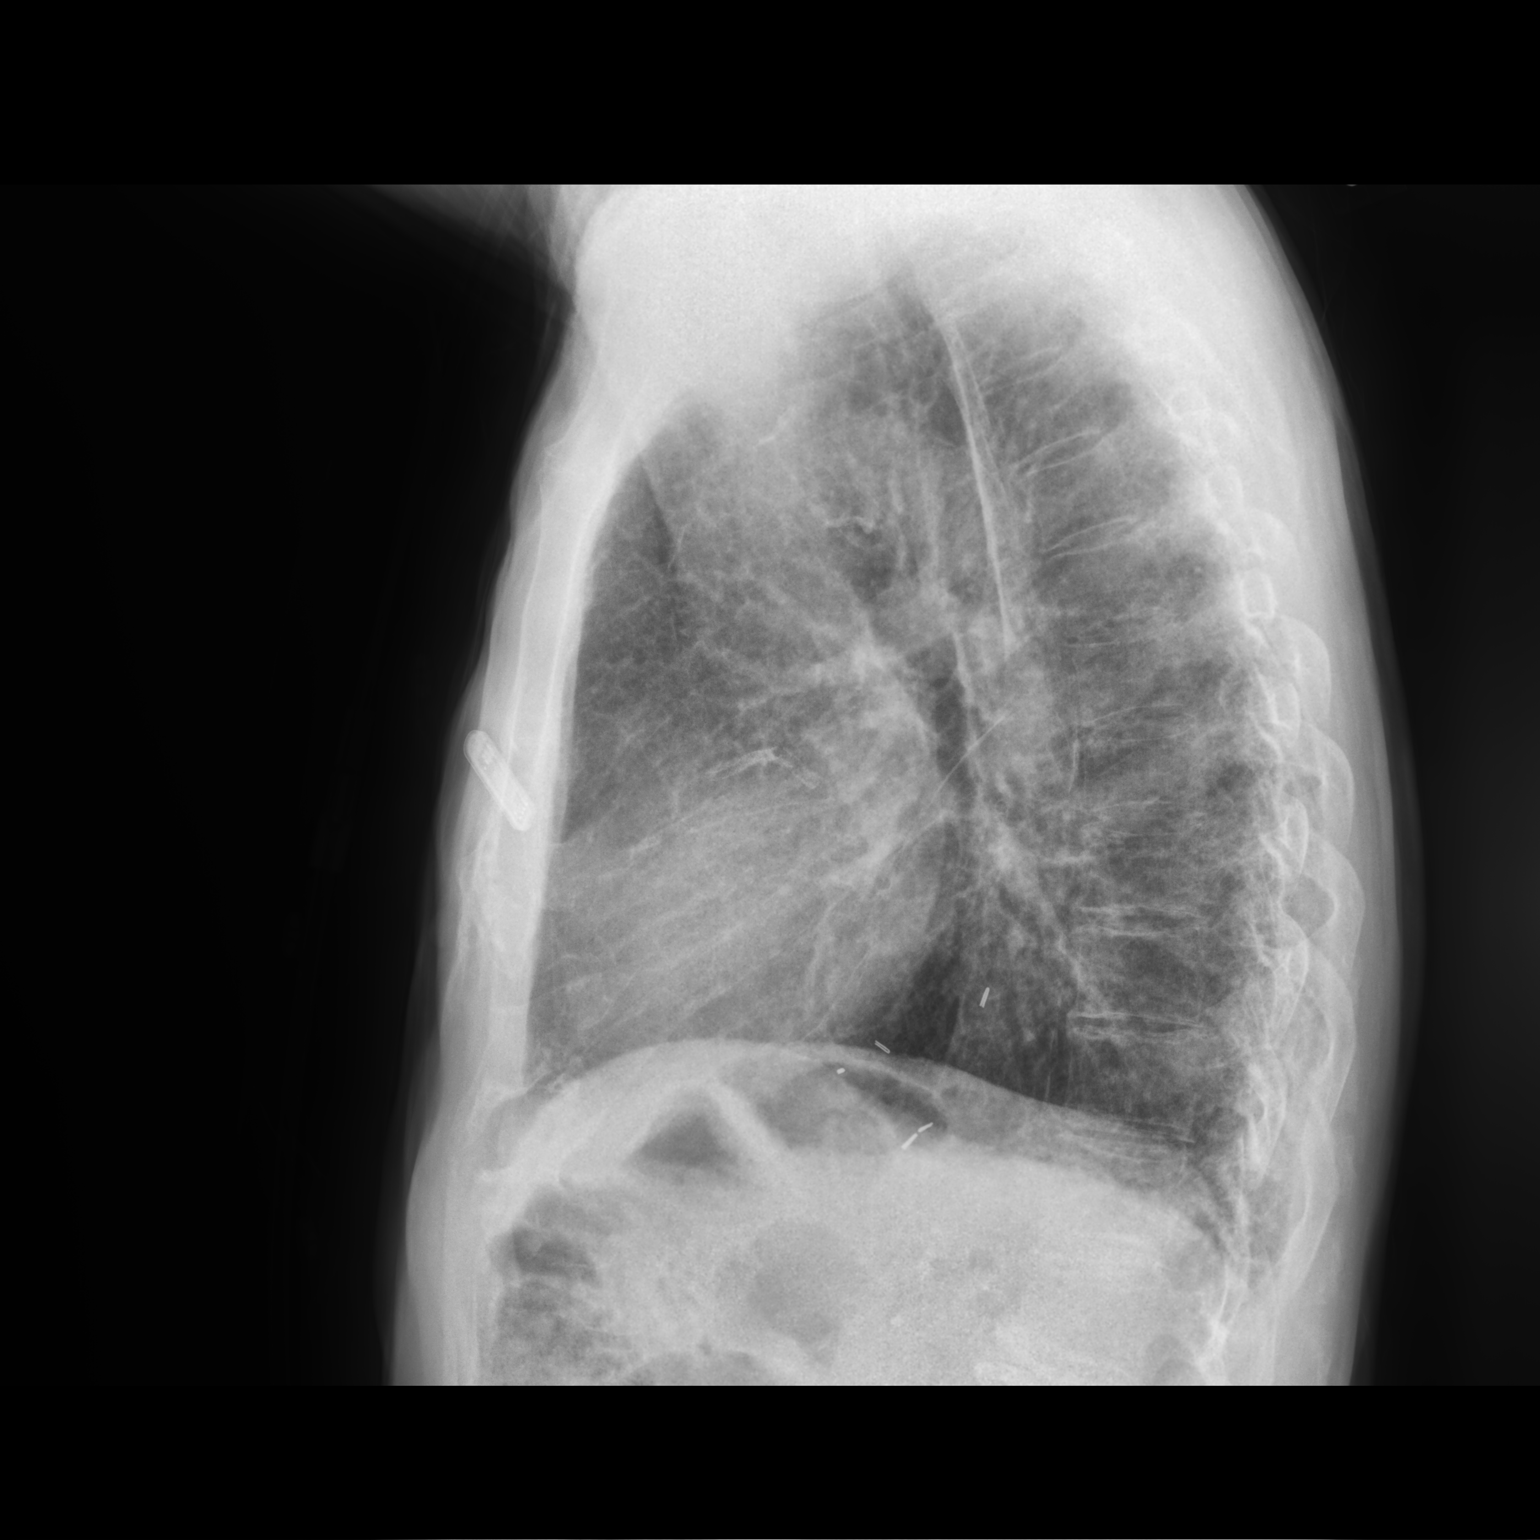

[2 of 2 positions shown; findings below may reference images not displayed]

FINDINGS: Subpleural reticulation/fibrosis in the lungs bilaterally, favoring
emphysema with superimposed mild chronic interstitial lung disease
when correlating with prior CT.

Superimposed mild patchy opacity in the lingula, possibly reflecting
atelectasis, although pneumonia is not entirely excluded.

No frank interstitial edema. No pleural effusions. No pneumothorax.

The heart is normal in size.  Thoracic aortic atherosclerosis.

Surgical clips the GE junction.

Mild degenerative changes of the lower thoracic/upper lumbar spine.
IMPRESSION: Mild patchy opacity in the lingula, possibly reflecting atelectasis,
although pneumonia is not entirely excluded.

Underlying emphysema with suspected mild chronic interstitial lung
disease. No frank interstitial edema. No pleural effusions.
